# Patient Record
Sex: Female | Born: 1950 | Race: White | Hispanic: No | Marital: Married | Smoking: Former smoker
Health system: Southern US, Community
[De-identification: ages and names within clinical notes are randomized; demographics above are authoritative.]

## PROBLEM LIST (undated history)

## (undated) DIAGNOSIS — R251 Tremor, unspecified: Secondary | ICD-10-CM

## (undated) DIAGNOSIS — M25512 Pain in left shoulder: Secondary | ICD-10-CM

## (undated) DIAGNOSIS — G14 Postpolio syndrome: Secondary | ICD-10-CM

## (undated) DIAGNOSIS — R112 Nausea with vomiting, unspecified: Secondary | ICD-10-CM

## (undated) DIAGNOSIS — M5382 Other specified dorsopathies, cervical region: Secondary | ICD-10-CM

## (undated) DIAGNOSIS — K5909 Other constipation: Secondary | ICD-10-CM

## (undated) DIAGNOSIS — C50919 Malignant neoplasm of unspecified site of unspecified female breast: Secondary | ICD-10-CM

## (undated) DIAGNOSIS — F32A Depression, unspecified: Secondary | ICD-10-CM

## (undated) DIAGNOSIS — IMO0002 Reserved for concepts with insufficient information to code with codable children: Secondary | ICD-10-CM

## (undated) DIAGNOSIS — K219 Gastro-esophageal reflux disease without esophagitis: Secondary | ICD-10-CM

## (undated) DIAGNOSIS — J309 Allergic rhinitis, unspecified: Secondary | ICD-10-CM

## (undated) DIAGNOSIS — G4700 Insomnia, unspecified: Secondary | ICD-10-CM

## (undated) DIAGNOSIS — I739 Peripheral vascular disease, unspecified: Secondary | ICD-10-CM

## (undated) DIAGNOSIS — R413 Other amnesia: Secondary | ICD-10-CM

## (undated) DIAGNOSIS — R011 Cardiac murmur, unspecified: Secondary | ICD-10-CM

## (undated) DIAGNOSIS — M533 Sacrococcygeal disorders, not elsewhere classified: Secondary | ICD-10-CM

## (undated) DIAGNOSIS — G20 Parkinson's disease: Secondary | ICD-10-CM

## (undated) DIAGNOSIS — Z8742 Personal history of other diseases of the female genital tract: Secondary | ICD-10-CM

## (undated) DIAGNOSIS — G5620 Lesion of ulnar nerve, unspecified upper limb: Secondary | ICD-10-CM

## (undated) DIAGNOSIS — Z8669 Personal history of other diseases of the nervous system and sense organs: Secondary | ICD-10-CM

## (undated) DIAGNOSIS — M961 Postlaminectomy syndrome, not elsewhere classified: Secondary | ICD-10-CM

## (undated) DIAGNOSIS — M542 Cervicalgia: Secondary | ICD-10-CM

## (undated) DIAGNOSIS — R232 Flushing: Secondary | ICD-10-CM

## (undated) DIAGNOSIS — I10 Essential (primary) hypertension: Secondary | ICD-10-CM

## (undated) DIAGNOSIS — Z9889 Other specified postprocedural states: Secondary | ICD-10-CM

## (undated) DIAGNOSIS — Z860100 Personal history of colon polyps, unspecified: Secondary | ICD-10-CM

## (undated) DIAGNOSIS — M810 Age-related osteoporosis without current pathological fracture: Secondary | ICD-10-CM

## (undated) DIAGNOSIS — T8859XA Other complications of anesthesia, initial encounter: Secondary | ICD-10-CM

## (undated) DIAGNOSIS — E559 Vitamin D deficiency, unspecified: Secondary | ICD-10-CM

## (undated) DIAGNOSIS — C801 Malignant (primary) neoplasm, unspecified: Secondary | ICD-10-CM

## (undated) DIAGNOSIS — T7840XA Allergy, unspecified, initial encounter: Secondary | ICD-10-CM

## (undated) DIAGNOSIS — E785 Hyperlipidemia, unspecified: Secondary | ICD-10-CM

## (undated) DIAGNOSIS — F329 Major depressive disorder, single episode, unspecified: Secondary | ICD-10-CM

## (undated) DIAGNOSIS — M7710 Lateral epicondylitis, unspecified elbow: Secondary | ICD-10-CM

## (undated) DIAGNOSIS — N951 Menopausal and female climacteric states: Secondary | ICD-10-CM

## (undated) DIAGNOSIS — R209 Unspecified disturbances of skin sensation: Secondary | ICD-10-CM

## (undated) DIAGNOSIS — Z923 Personal history of irradiation: Secondary | ICD-10-CM

## (undated) DIAGNOSIS — G4730 Sleep apnea, unspecified: Secondary | ICD-10-CM

## (undated) DIAGNOSIS — B91 Sequelae of poliomyelitis: Secondary | ICD-10-CM

## (undated) DIAGNOSIS — T4145XA Adverse effect of unspecified anesthetic, initial encounter: Secondary | ICD-10-CM

## (undated) HISTORY — DX: Essential (primary) hypertension: I10

## (undated) HISTORY — PX: COLONOSCOPY: SHX174

## (undated) HISTORY — DX: Unspecified disturbances of skin sensation: R20.9

## (undated) HISTORY — PX: OTHER SURGICAL HISTORY: SHX169

## (undated) HISTORY — DX: Lesion of ulnar nerve, unspecified upper limb: G56.20

## (undated) HISTORY — DX: Lateral epicondylitis, unspecified elbow: M77.10

## (undated) HISTORY — PX: CARPAL TUNNEL RELEASE: SHX101

## (undated) HISTORY — DX: Postlaminectomy syndrome, not elsewhere classified: M96.1

## (undated) HISTORY — DX: Sequelae of poliomyelitis: B91

## (undated) HISTORY — DX: Sacrococcygeal disorders, not elsewhere classified: M53.3

## (undated) HISTORY — PX: SHOULDER ARTHROSCOPY W/ ROTATOR CUFF REPAIR: SHX2400

## (undated) HISTORY — PX: TUBAL LIGATION: SHX77

## (undated) HISTORY — DX: Parkinson's disease: G20

## (undated) HISTORY — PX: TONSILLECTOMY: SUR1361

## (undated) HISTORY — DX: Peripheral vascular disease, unspecified: I73.9

## (undated) HISTORY — PX: ESOPHAGOGASTRODUODENOSCOPY: SHX1529

## (undated) HISTORY — DX: Other specified dorsopathies, cervical region: M53.82

## (undated) HISTORY — PX: SPINE SURGERY: SHX786

## (undated) HISTORY — DX: Malignant (primary) neoplasm, unspecified: C80.1

---
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
## 1995-05-04 HISTORY — PX: ABDOMINAL HYSTERECTOMY: SHX81

## 1998-10-23 ENCOUNTER — Encounter: Payer: Self-pay | Admitting: Orthopedic Surgery

## 1998-10-23 ENCOUNTER — Ambulatory Visit (HOSPITAL_COMMUNITY): Admission: RE | Admit: 1998-10-23 | Discharge: 1998-10-23 | Payer: Self-pay | Admitting: Orthopedic Surgery

## 2000-07-19 HISTORY — PX: FLEXIBLE SIGMOIDOSCOPY: SHX1649

## 2004-02-28 ENCOUNTER — Ambulatory Visit: Payer: Self-pay | Admitting: General Surgery

## 2004-08-10 ENCOUNTER — Ambulatory Visit: Payer: Self-pay | Admitting: Obstetrics and Gynecology

## 2004-08-19 ENCOUNTER — Ambulatory Visit: Payer: Self-pay | Admitting: General Surgery

## 2005-01-05 ENCOUNTER — Ambulatory Visit: Payer: Self-pay | Admitting: Specialist

## 2005-05-11 ENCOUNTER — Ambulatory Visit: Payer: Self-pay | Admitting: Family Medicine

## 2005-06-01 ENCOUNTER — Ambulatory Visit: Payer: Self-pay | Admitting: Obstetrics and Gynecology

## 2005-06-10 ENCOUNTER — Encounter
Admission: RE | Admit: 2005-06-10 | Discharge: 2005-09-08 | Payer: Self-pay | Admitting: Physical Medicine & Rehabilitation

## 2005-06-10 ENCOUNTER — Ambulatory Visit: Payer: Self-pay | Admitting: Physical Medicine & Rehabilitation

## 2005-08-05 ENCOUNTER — Ambulatory Visit: Payer: Self-pay | Admitting: Physical Medicine & Rehabilitation

## 2005-08-09 ENCOUNTER — Ambulatory Visit: Payer: Self-pay | Admitting: Physical Medicine & Rehabilitation

## 2005-08-20 ENCOUNTER — Ambulatory Visit: Payer: Self-pay | Admitting: Family Medicine

## 2005-09-09 ENCOUNTER — Ambulatory Visit: Payer: Self-pay | Admitting: Physical Medicine & Rehabilitation

## 2005-09-09 ENCOUNTER — Encounter
Admission: RE | Admit: 2005-09-09 | Discharge: 2005-12-08 | Payer: Self-pay | Admitting: Physical Medicine & Rehabilitation

## 2005-10-28 ENCOUNTER — Ambulatory Visit: Payer: Self-pay | Admitting: Physical Medicine & Rehabilitation

## 2006-01-31 ENCOUNTER — Ambulatory Visit: Payer: Self-pay | Admitting: Physical Medicine & Rehabilitation

## 2006-01-31 ENCOUNTER — Encounter
Admission: RE | Admit: 2006-01-31 | Discharge: 2006-05-01 | Payer: Self-pay | Admitting: Physical Medicine & Rehabilitation

## 2006-05-03 DIAGNOSIS — IMO0001 Reserved for inherently not codable concepts without codable children: Secondary | ICD-10-CM

## 2006-05-03 DIAGNOSIS — Z8701 Personal history of pneumonia (recurrent): Secondary | ICD-10-CM

## 2006-05-03 DIAGNOSIS — C50919 Malignant neoplasm of unspecified site of unspecified female breast: Secondary | ICD-10-CM

## 2006-05-03 HISTORY — PX: BREAST LUMPECTOMY: SHX2

## 2006-05-03 HISTORY — DX: Personal history of pneumonia (recurrent): Z87.01

## 2006-05-03 HISTORY — DX: Malignant neoplasm of unspecified site of unspecified female breast: C50.919

## 2006-05-03 HISTORY — DX: Reserved for inherently not codable concepts without codable children: IMO0001

## 2006-06-13 ENCOUNTER — Encounter
Admission: RE | Admit: 2006-06-13 | Discharge: 2006-09-11 | Payer: Self-pay | Admitting: Physical Medicine & Rehabilitation

## 2006-06-13 ENCOUNTER — Ambulatory Visit: Payer: Self-pay | Admitting: Physical Medicine & Rehabilitation

## 2006-08-03 ENCOUNTER — Ambulatory Visit: Payer: Self-pay | Admitting: Family Medicine

## 2006-08-08 ENCOUNTER — Encounter: Payer: Self-pay | Admitting: Neurology

## 2006-08-15 ENCOUNTER — Ambulatory Visit: Payer: Self-pay | Admitting: Physical Medicine & Rehabilitation

## 2006-08-23 ENCOUNTER — Ambulatory Visit: Payer: Self-pay | Admitting: Family Medicine

## 2006-08-26 ENCOUNTER — Ambulatory Visit: Payer: Self-pay | Admitting: Family Medicine

## 2006-09-01 ENCOUNTER — Encounter: Payer: Self-pay | Admitting: Neurology

## 2006-09-08 ENCOUNTER — Encounter
Admission: RE | Admit: 2006-09-08 | Discharge: 2006-12-07 | Payer: Self-pay | Admitting: Physical Medicine & Rehabilitation

## 2006-09-08 ENCOUNTER — Ambulatory Visit: Payer: Self-pay | Admitting: General Surgery

## 2006-09-15 ENCOUNTER — Ambulatory Visit: Payer: Self-pay | Admitting: General Surgery

## 2006-09-16 ENCOUNTER — Ambulatory Visit: Payer: Self-pay | Admitting: General Surgery

## 2006-10-02 ENCOUNTER — Ambulatory Visit: Payer: Self-pay | Admitting: Radiation Oncology

## 2006-10-03 ENCOUNTER — Ambulatory Visit: Payer: Self-pay | Admitting: Family Medicine

## 2006-10-06 ENCOUNTER — Ambulatory Visit: Payer: Self-pay | Admitting: Radiation Oncology

## 2006-10-17 ENCOUNTER — Ambulatory Visit: Payer: Self-pay

## 2006-11-01 ENCOUNTER — Ambulatory Visit: Payer: Self-pay | Admitting: Radiation Oncology

## 2006-12-02 ENCOUNTER — Ambulatory Visit: Payer: Self-pay | Admitting: Radiation Oncology

## 2007-01-02 ENCOUNTER — Ambulatory Visit: Payer: Self-pay | Admitting: Radiation Oncology

## 2007-02-01 ENCOUNTER — Ambulatory Visit: Payer: Self-pay | Admitting: Radiation Oncology

## 2007-03-01 ENCOUNTER — Ambulatory Visit: Payer: Self-pay | Admitting: Physical Medicine & Rehabilitation

## 2007-03-01 ENCOUNTER — Encounter
Admission: RE | Admit: 2007-03-01 | Discharge: 2007-05-30 | Payer: Self-pay | Admitting: Physical Medicine & Rehabilitation

## 2007-04-05 ENCOUNTER — Ambulatory Visit: Payer: Self-pay | Admitting: General Surgery

## 2007-04-10 ENCOUNTER — Ambulatory Visit: Payer: Self-pay | Admitting: Specialist

## 2007-04-12 ENCOUNTER — Ambulatory Visit: Payer: Self-pay | Admitting: Physical Medicine & Rehabilitation

## 2007-04-21 ENCOUNTER — Ambulatory Visit: Payer: Self-pay | Admitting: Physical Medicine & Rehabilitation

## 2007-05-22 ENCOUNTER — Encounter: Admission: RE | Admit: 2007-05-22 | Discharge: 2007-08-20 | Payer: Self-pay | Admitting: Anesthesiology

## 2007-05-24 ENCOUNTER — Encounter
Admission: RE | Admit: 2007-05-24 | Discharge: 2007-06-05 | Payer: Self-pay | Admitting: Physical Medicine & Rehabilitation

## 2007-05-24 ENCOUNTER — Ambulatory Visit: Payer: Self-pay | Admitting: Physical Medicine & Rehabilitation

## 2007-06-02 ENCOUNTER — Ambulatory Visit: Payer: Self-pay | Admitting: Physical Medicine & Rehabilitation

## 2007-06-13 ENCOUNTER — Ambulatory Visit: Payer: Self-pay | Admitting: Anesthesiology

## 2007-06-27 ENCOUNTER — Other Ambulatory Visit: Payer: Self-pay

## 2007-06-27 ENCOUNTER — Ambulatory Visit: Payer: Self-pay | Admitting: Specialist

## 2007-06-28 ENCOUNTER — Ambulatory Visit: Payer: Self-pay | Admitting: Cardiology

## 2007-07-02 ENCOUNTER — Ambulatory Visit: Payer: Self-pay | Admitting: Radiation Oncology

## 2007-07-04 ENCOUNTER — Ambulatory Visit: Payer: Self-pay | Admitting: Specialist

## 2007-07-13 ENCOUNTER — Ambulatory Visit: Payer: Self-pay | Admitting: Radiation Oncology

## 2007-07-17 ENCOUNTER — Encounter
Admission: RE | Admit: 2007-07-17 | Discharge: 2007-10-15 | Payer: Self-pay | Admitting: Physical Medicine & Rehabilitation

## 2007-07-17 ENCOUNTER — Ambulatory Visit: Payer: Self-pay | Admitting: Physical Medicine & Rehabilitation

## 2007-07-18 ENCOUNTER — Ambulatory Visit: Payer: Self-pay | Admitting: Physical Medicine & Rehabilitation

## 2007-08-02 ENCOUNTER — Ambulatory Visit: Payer: Self-pay | Admitting: Radiation Oncology

## 2007-08-08 ENCOUNTER — Ambulatory Visit: Payer: Self-pay | Admitting: Anesthesiology

## 2007-08-29 ENCOUNTER — Ambulatory Visit: Payer: Self-pay | Admitting: Physical Medicine & Rehabilitation

## 2007-09-26 ENCOUNTER — Ambulatory Visit: Payer: Self-pay | Admitting: Physical Medicine & Rehabilitation

## 2007-10-06 ENCOUNTER — Ambulatory Visit: Payer: Self-pay | Admitting: General Surgery

## 2007-10-16 ENCOUNTER — Ambulatory Visit (HOSPITAL_COMMUNITY): Admission: RE | Admit: 2007-10-16 | Discharge: 2007-10-17 | Payer: Self-pay | Admitting: Neurosurgery

## 2007-11-01 ENCOUNTER — Ambulatory Visit: Payer: Self-pay | Admitting: Radiation Oncology

## 2007-12-14 ENCOUNTER — Encounter: Admission: RE | Admit: 2007-12-14 | Discharge: 2007-12-14 | Payer: Self-pay | Admitting: Neurosurgery

## 2008-04-03 ENCOUNTER — Ambulatory Visit: Payer: Self-pay | Admitting: Specialist

## 2008-05-03 DIAGNOSIS — K635 Polyp of colon: Secondary | ICD-10-CM

## 2008-05-03 HISTORY — DX: Polyp of colon: K63.5

## 2008-05-07 ENCOUNTER — Ambulatory Visit: Payer: Self-pay | Admitting: Internal Medicine

## 2008-05-07 ENCOUNTER — Ambulatory Visit: Payer: Self-pay | Admitting: Specialist

## 2008-05-08 ENCOUNTER — Ambulatory Visit: Payer: Self-pay | Admitting: General Surgery

## 2008-05-14 ENCOUNTER — Ambulatory Visit: Payer: Self-pay | Admitting: Specialist

## 2008-07-01 ENCOUNTER — Ambulatory Visit: Payer: Self-pay | Admitting: Radiation Oncology

## 2008-07-10 ENCOUNTER — Ambulatory Visit: Payer: Self-pay | Admitting: Radiation Oncology

## 2008-07-22 ENCOUNTER — Encounter: Payer: Self-pay | Admitting: Specialist

## 2008-08-01 ENCOUNTER — Encounter: Payer: Self-pay | Admitting: Specialist

## 2008-08-01 ENCOUNTER — Ambulatory Visit: Payer: Self-pay | Admitting: Radiation Oncology

## 2008-10-01 ENCOUNTER — Ambulatory Visit: Payer: Self-pay | Admitting: Gastroenterology

## 2008-10-08 ENCOUNTER — Ambulatory Visit: Payer: Self-pay | Admitting: General Surgery

## 2009-03-18 ENCOUNTER — Ambulatory Visit: Payer: Self-pay | Admitting: Family Medicine

## 2009-03-21 ENCOUNTER — Ambulatory Visit: Payer: Self-pay | Admitting: Family Medicine

## 2009-04-02 ENCOUNTER — Ambulatory Visit: Payer: Self-pay | Admitting: General Surgery

## 2009-04-21 ENCOUNTER — Encounter: Payer: Self-pay | Admitting: Neurosurgery

## 2009-05-03 ENCOUNTER — Encounter: Payer: Self-pay | Admitting: Neurosurgery

## 2009-05-03 DIAGNOSIS — G20A1 Parkinson's disease without dyskinesia, without mention of fluctuations: Secondary | ICD-10-CM

## 2009-05-03 DIAGNOSIS — I739 Peripheral vascular disease, unspecified: Secondary | ICD-10-CM

## 2009-05-03 DIAGNOSIS — G20 Parkinson's disease: Secondary | ICD-10-CM

## 2009-05-03 HISTORY — DX: Parkinson's disease: G20

## 2009-05-03 HISTORY — DX: Peripheral vascular disease, unspecified: I73.9

## 2009-05-03 HISTORY — DX: Parkinson's disease without dyskinesia, without mention of fluctuations: G20.A1

## 2009-05-03 HISTORY — PX: MASTECTOMY: SHX3

## 2009-05-03 HISTORY — PX: ELBOW SURGERY: SHX618

## 2009-06-04 ENCOUNTER — Ambulatory Visit: Payer: Self-pay | Admitting: Family Medicine

## 2009-06-04 ENCOUNTER — Ambulatory Visit: Payer: Self-pay | Admitting: Specialist

## 2009-06-10 ENCOUNTER — Ambulatory Visit: Payer: Self-pay | Admitting: Specialist

## 2009-07-10 ENCOUNTER — Ambulatory Visit: Payer: Self-pay | Admitting: Radiation Oncology

## 2009-09-10 ENCOUNTER — Encounter
Admission: RE | Admit: 2009-09-10 | Discharge: 2009-11-27 | Payer: Self-pay | Admitting: Physical Medicine & Rehabilitation

## 2009-09-12 ENCOUNTER — Ambulatory Visit: Payer: Self-pay | Admitting: Physical Medicine & Rehabilitation

## 2009-09-22 ENCOUNTER — Encounter: Payer: Self-pay | Admitting: Specialist

## 2009-09-25 ENCOUNTER — Ambulatory Visit: Payer: Self-pay | Admitting: Physical Medicine & Rehabilitation

## 2009-10-01 ENCOUNTER — Encounter: Payer: Self-pay | Admitting: Specialist

## 2009-10-01 ENCOUNTER — Ambulatory Visit: Payer: Self-pay | Admitting: Otolaryngology

## 2009-10-15 ENCOUNTER — Ambulatory Visit: Payer: Self-pay | Admitting: General Surgery

## 2009-10-28 ENCOUNTER — Ambulatory Visit: Payer: Self-pay | Admitting: General Surgery

## 2009-11-04 ENCOUNTER — Encounter: Admission: RE | Admit: 2009-11-04 | Discharge: 2009-11-04 | Payer: Self-pay

## 2009-11-06 ENCOUNTER — Ambulatory Visit: Payer: Self-pay | Admitting: Gastroenterology

## 2009-11-27 ENCOUNTER — Ambulatory Visit: Payer: Self-pay | Admitting: Physical Medicine & Rehabilitation

## 2009-12-04 ENCOUNTER — Ambulatory Visit: Payer: Self-pay | Admitting: General Surgery

## 2009-12-04 HISTORY — PX: BREAST SURGERY: SHX581

## 2009-12-10 LAB — PATHOLOGY REPORT

## 2010-01-27 ENCOUNTER — Encounter
Admission: RE | Admit: 2010-01-27 | Discharge: 2010-02-02 | Payer: Self-pay | Source: Home / Self Care | Attending: Physical Medicine & Rehabilitation | Admitting: Physical Medicine & Rehabilitation

## 2010-02-02 ENCOUNTER — Ambulatory Visit: Payer: Self-pay | Admitting: Physical Medicine & Rehabilitation

## 2010-05-04 ENCOUNTER — Ambulatory Visit: Payer: Self-pay | Admitting: Physical Medicine & Rehabilitation

## 2010-05-13 ENCOUNTER — Encounter
Admission: RE | Admit: 2010-05-13 | Discharge: 2010-05-25 | Payer: Self-pay | Source: Home / Self Care | Attending: Physical Medicine & Rehabilitation | Admitting: Physical Medicine & Rehabilitation

## 2010-05-14 ENCOUNTER — Ambulatory Visit
Admission: RE | Admit: 2010-05-14 | Discharge: 2010-05-14 | Payer: Self-pay | Source: Home / Self Care | Attending: Physical Medicine & Rehabilitation | Admitting: Physical Medicine & Rehabilitation

## 2010-05-20 ENCOUNTER — Encounter
Admission: RE | Admit: 2010-05-20 | Discharge: 2010-05-25 | Payer: Self-pay | Source: Home / Self Care | Attending: Physical Medicine & Rehabilitation | Admitting: Physical Medicine & Rehabilitation

## 2010-05-24 ENCOUNTER — Encounter: Payer: Self-pay | Admitting: Family Medicine

## 2010-06-10 ENCOUNTER — Ambulatory Visit: Payer: Self-pay | Admitting: General Surgery

## 2010-06-22 ENCOUNTER — Ambulatory Visit: Payer: Medicare Other | Admitting: Physical Medicine & Rehabilitation

## 2010-06-22 ENCOUNTER — Encounter: Payer: Medicare Other | Attending: Physical Medicine & Rehabilitation

## 2010-06-22 DIAGNOSIS — M961 Postlaminectomy syndrome, not elsewhere classified: Secondary | ICD-10-CM | POA: Insufficient documentation

## 2010-06-22 DIAGNOSIS — M25559 Pain in unspecified hip: Secondary | ICD-10-CM | POA: Insufficient documentation

## 2010-06-22 DIAGNOSIS — M47812 Spondylosis without myelopathy or radiculopathy, cervical region: Secondary | ICD-10-CM | POA: Insufficient documentation

## 2010-06-22 DIAGNOSIS — M545 Low back pain, unspecified: Secondary | ICD-10-CM | POA: Insufficient documentation

## 2010-06-22 DIAGNOSIS — R209 Unspecified disturbances of skin sensation: Secondary | ICD-10-CM | POA: Insufficient documentation

## 2010-06-22 DIAGNOSIS — Z981 Arthrodesis status: Secondary | ICD-10-CM | POA: Insufficient documentation

## 2010-06-22 DIAGNOSIS — IMO0001 Reserved for inherently not codable concepts without codable children: Secondary | ICD-10-CM

## 2010-06-22 DIAGNOSIS — M79609 Pain in unspecified limb: Secondary | ICD-10-CM | POA: Insufficient documentation

## 2010-06-22 DIAGNOSIS — M531 Cervicobrachial syndrome: Secondary | ICD-10-CM

## 2010-07-08 ENCOUNTER — Encounter: Payer: Self-pay | Admitting: Physical Medicine & Rehabilitation

## 2010-07-27 ENCOUNTER — Ambulatory Visit: Payer: Medicare Other | Admitting: Physical Medicine & Rehabilitation

## 2010-07-27 ENCOUNTER — Encounter: Payer: Medicare Other | Attending: Physical Medicine & Rehabilitation

## 2010-07-27 DIAGNOSIS — IMO0001 Reserved for inherently not codable concepts without codable children: Secondary | ICD-10-CM

## 2010-07-27 DIAGNOSIS — M5382 Other specified dorsopathies, cervical region: Secondary | ICD-10-CM

## 2010-07-27 DIAGNOSIS — M545 Low back pain, unspecified: Secondary | ICD-10-CM | POA: Insufficient documentation

## 2010-07-27 DIAGNOSIS — M961 Postlaminectomy syndrome, not elsewhere classified: Secondary | ICD-10-CM

## 2010-07-27 DIAGNOSIS — R209 Unspecified disturbances of skin sensation: Secondary | ICD-10-CM | POA: Insufficient documentation

## 2010-07-27 DIAGNOSIS — M533 Sacrococcygeal disorders, not elsewhere classified: Secondary | ICD-10-CM

## 2010-07-27 DIAGNOSIS — M47812 Spondylosis without myelopathy or radiculopathy, cervical region: Secondary | ICD-10-CM | POA: Insufficient documentation

## 2010-07-27 DIAGNOSIS — M79609 Pain in unspecified limb: Secondary | ICD-10-CM | POA: Insufficient documentation

## 2010-07-27 DIAGNOSIS — Z981 Arthrodesis status: Secondary | ICD-10-CM | POA: Insufficient documentation

## 2010-07-27 DIAGNOSIS — M25559 Pain in unspecified hip: Secondary | ICD-10-CM | POA: Insufficient documentation

## 2010-08-31 ENCOUNTER — Encounter: Payer: Medicare Other | Attending: Physical Medicine & Rehabilitation

## 2010-08-31 ENCOUNTER — Ambulatory Visit: Payer: Medicare Other | Admitting: Physical Medicine & Rehabilitation

## 2010-08-31 DIAGNOSIS — Z981 Arthrodesis status: Secondary | ICD-10-CM | POA: Insufficient documentation

## 2010-08-31 DIAGNOSIS — M961 Postlaminectomy syndrome, not elsewhere classified: Secondary | ICD-10-CM | POA: Insufficient documentation

## 2010-08-31 DIAGNOSIS — M79609 Pain in unspecified limb: Secondary | ICD-10-CM | POA: Insufficient documentation

## 2010-08-31 DIAGNOSIS — M533 Sacrococcygeal disorders, not elsewhere classified: Secondary | ICD-10-CM

## 2010-08-31 DIAGNOSIS — M47812 Spondylosis without myelopathy or radiculopathy, cervical region: Secondary | ICD-10-CM | POA: Insufficient documentation

## 2010-08-31 DIAGNOSIS — IMO0001 Reserved for inherently not codable concepts without codable children: Secondary | ICD-10-CM | POA: Insufficient documentation

## 2010-08-31 DIAGNOSIS — M545 Low back pain, unspecified: Secondary | ICD-10-CM | POA: Insufficient documentation

## 2010-08-31 DIAGNOSIS — M25559 Pain in unspecified hip: Secondary | ICD-10-CM | POA: Insufficient documentation

## 2010-09-01 NOTE — Procedures (Signed)
Andrea [name], BLUESTONE                 ACCOUNT NO.:  [id number]  MEDICAL RECORD NO.:  [id number]           PATIENT TYPE:  O  LOCATION:  [facility]                          FACILITY:  [facility]  PHYSICIAN:  Erick Colace, M.D.DATE OF BIRTH:  [birth year]  DATE OF PROCEDURE:  08/31/2010 DATE OF DISCHARGE:                              OPERATIVE REPORT  PROCEDURE:  Bilateral sacroiliac injection under fluoroscopic guidance.  INDICATIONS:  Sacroiliac pain.  Pain is only partially responsive to medication management including narcotic analgesics and interferes with activity, rates as 6/10.  Informed consent was obtained after describing risks and benefits of the procedure with the patient.  These include bleeding, bruising, and infection.  She elects to proceed and has given written consent.  The patient was placed prone on fluoroscopy table.  Betadine prep, sterile drape, 25-gauge inch and half needle was used to anesthetize the skin and subcutaneous tissue with 1% lidocaine x2 mL on each side.  Then, a 25-gauge 3-inch spinal needle was inserted under fluoroscopic guidance, first starting at right SI joint.  AP, lateral, and oblique aid images utilized.  Omnipaque 180 under live fluoro x0.5 mL demonstrated no intravascular uptake.  Then, 1.5 mL of solution containing 1 mL of 40 mg/mL Depo-Medrol and 2 mL of 2% lidocaine were injected.  The same procedure was repeated on the left side using same needle, injectate, and technique.  The patient tolerated the procedure well.  Postprocedure instructions were given.     Erick Colace, M.D. Electronically Signed    AEK/MEDQ  D:  08/31/2010 14:26:10  T:  09/01/2010 01:38:46  Job:  [id number]  cc:   Leim Fabry, MD

## 2010-09-15 NOTE — Procedures (Signed)
Andrea [name], Andrea [name] NO.:  [id number]   MEDICAL RECORD NO.:  [id number]          PATIENT TYPE:  REC   LOCATION:  [facility]                          FACILITY:  [facility]   PHYSICIAN:  Celene Kras, MD        DATE OF BIRTH:  07-Jul-1950   DATE OF PROCEDURE:  08/08/2007  DATE OF DISCHARGE:                               OPERATIVE REPORT   SUBJECTIVE:  Andrea [name] comes into the pain management today.  Review of  systems 14-point review of systems.  1. Still having some arm pain, but improving, better range of motion,      better sleep patterns, improved function.  2. Nothing advancing from a neurological perspective, do not believe      further imaging or diagnostics are warranted.  3. I will go on with third cervical epidural.  She is benefiting from      these so will stay the course.  As she has a spondylosis, I would      consider going on to facet medial branch intervention, but she has      somewhat of a myelopathy so we will see if we cannot address that      and determine further course of care.   OBJECTIVE:  Diffuse paracervical myofascial discomfort, positive  cervical __________  compression test particular right greater than  left.  Suboccipital compression test positive.  Suprascapular/levator  scapular extension, good grip strength, I do not find anything new from  a neurological perspective, good reflexes.   IMPRESSION:  Degenerative spine disease and cervical spine with arm  pain.   PLAN:  1. Cervical epidural.  She has consented.  Predicate any further      intervention based on need and follow up with her.  2. Again counseled as to modifiable features in the health profile      such as cigarette cessation.  3. She will maintain contact with primary care.   She has consented.   PROCEDURE:  The patient was taken to fluoroscopy suite and placed in the  prone position.  Back prepped and draped in the usual fashion.  Using a  __________  needle, I advanced under  direct fluoroscopic observation to  C7-T1 without any evidence of CSF __________  or paresthesia.  Test  block uneventfully followed, 40 mg Aristocort flush needle.   She tolerated procedure well.  No complications from our procedure.  Appropriate recovery.  Discharge instructions given.           ______________________________  Celene Kras, MD     HH/MEDQ  D:  08/08/2007 09:58:27  T:  08/08/2007 11:21:55  Job:  [id number]

## 2010-09-15 NOTE — Op Note (Signed)
Andrea [name], [name]                 ACCOUNT NO.:  [id number]   MEDICAL RECORD NO.:  [id number]          PATIENT TYPE:  OIB   LOCATION:  3526                         FACILITY:  [facility]   PHYSICIAN:  Kathaleen Maser. Pool, M.D.    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   PROCEDURE NAME:  C5-6 anterior cervical diskectomy, fusion, allograft,  and plating.   SURGEON:  Kathaleen Maser. Pool, MD.   ASSISTANT:  Donalee Citrin, MD   ANESTHESIA:  General endotracheal.   INDICATIONS:  Andrea [name] is a 60 year old female with history of neck  and bilateral upper extremity pain and paresthesias consistent with her  early cervical myelopathy.  Workup demonstrates evidence of moderate  severe stenosis with spinal cord compression at C5-6.  The patient has  been counseled as to her options.  She decided to proceed with C5-6  anterior cervical diskectomy and fusion with allograft and plating in  hopes of improving her symptoms.   OPERATIVE NOTE:  The patient was placed on the operative table in supine  position.  After adequate level of anesthesia achieved, the patient was  positioned supine with her neck slightly extended up with Halter  traction.  The patient's anterior cervical regions were prepped and  draped sterilely.  A 10-blade was used to make a curvilinear skin  incision, overlying the C5-6 interspace.  This was carried down sharply  to the platysma.  Platysma then divided vertically and dissection  proceeded along the border of the sternocleidomastoid muscle and carotid  sheath.  Trachea and esophagus were mobilized and tracked towards the  left.  Prevertebral fascia was stripped off the anterior spinal column.  Longus colli muscles were then elevated bilaterally using  electrocautery.  Deep  self-retaining retractor was placed.  Intraoperative fluoroscopy was used and C5-6 level was confirmed.  Disk  space then incised with #15 blade in rectangular fashion.  Wide disk  space clean-out was then achieved using pituitary rongeurs, forward and  back-angled Karlin curettes, Kerrison rongeurs, and high-speed drill.  Elements of the disk removed down toward the posterior annulus.  Microscope was then brought into the field and used throughout the  remainder of diskectomy and remainder aspects of annulus and osteophytes  were removed using high-speed drill down to the level of the posterior  longitudinal ligament.  Posterior longitudinal ligament was then  elevated and resected in piecemeal fashion using Kerrison rongeurs.  Underlying thecal sac was then identified.  A wide central decompression  was then performed undercutting the bodies at bodies of C5 and C6.  Decompression then proceeded out to each neural foramen.  Wide anterior  foraminotomies were then performed along the course of exiting C6 nerve  root bilaterally.  At this point, a very thorough decompression had been  achieved.  There is no injury to thecal sac and nerve roots. Wounds were  then irrigated with antibiotic solution.  Gelfoam was placed topically  for hemostasis and found to be good.  A 7-mm LifeNet allograft wedges  were then packed into place and recessed less than 1 mm from the  anterior cortical margin.  A 25-mm Atlantis cervical plate was then  placed on the C5 and C6 levels.  This was then attached under  fluoroscopic guidance 2 screws each at C5 and C6.  All 4 screws were  given final site tightening, found to be solid within bone.  Locking  screws engaged at both levels.  Final images revealed good position of  bone grafts and hardware at proper operative level with normal alignment  of spine.  Wound was then irrigated one final time.  Hemostasis was  ensured with bipolar electrocautery, wound was then  closed in layers.  Steri-Strips and sterile dressings were applied.  There were no  complications.  She tolerated procedure well and she returned to  recovery room postoperatively.           ______________________________  Kathaleen Maser Pool, M.D.     HAP/MEDQ  D:  10/16/2007  T:  10/17/2007  Job:  [id number]

## 2010-09-15 NOTE — Assessment & Plan Note (Signed)
[name] returns today.  I last saw her on June 13, 2007.  At that  time she was describing intermittent tingling and aching in her  bilateral hands and MRI did demonstrate C5-6 annular bulge, leftward  more than right side, moderate foraminal encroachment as well as some  right-sided encroachment at C4-5.  No signs of central stenosis.  She  underwent cervical epidural steroid injection at C7-T1, and then at C3-  C4 per Dr. Stevphen Rochester.  She has had relief of her hand numbness, tingling  pain and approximate one-third reduction of her neck pain.   Her chief complaint today is collar bone pain on the right side.  She  has had no trauma to the right collar bone.  She has had right shoulder  surgery in [place].  She states that it was an open procedure done  with what sounds like a brachial plexus nerve block.  She has had good  recovery.  In fact, is able to reach her arm with full range overhead.   Her pain is described as sharp, stabbing in the medial aspect of the  clavicle.  Pain is worse with certain head movements, tuning her head to  the right or sometimes bending it toward that way.   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   REVIEW OF SYSTEMS:  Positive for numbness, tingling, depression, some  limb swelling.   EXAMINATION:  Blood pressure 119/71, pulse 69, respiratory rate 18, O2  saturation 99% on room air.  GENERAL:  No acute distress.  Mood and affect appropriate.  Orientation  x3.  Affect is alert.  GAIT:  Stiff-legged gait on the left due to left KAFO.  Her motor strength is 5- at the deltoid, bicep, tricep, grip on the left  side, 5/5 in the right deltoid, bicep, tricep, grip.  Lower extremity  strength is 0/5 left side, 5- in the right hip flexor, knee extensor,  ankle dorsiflexor.  Her right clavicle has no deformities.  No  tenderness to palpation over that area.  No tenderness over the cervical  anterior  musculature.  There is mild tenderness over right cervical  lateral masses.   IMPRESSION:  1. Cervical spinal stenosis, radicular component improved after      epidural steroid injections.  Does still have some axial neck pain,      which may be related to a cervical facet syndrome.  2. Right clavicular pain.  Question if this is referred from her neck.      May be some nerve irritation from brachial plexus block, but does      appear to be more mechanical in that certain neck motions do      aggravate.  We will see how she does with cervical facet      injections.  If no improvement, consider some myofascial trigger      point injections as well.  We will do a right clavicular x-ray as      well.  I will follow up in one month.  She will see Dr. Stevphen Rochester for      facet injections on August 08, 2007.      Erick Colace, M.D.  Electronically Signed     AEK/MedQ  D:  07/18/2007 14:06:59  T:  07/18/2007 16:00:12  Job #:  [id number]   cc:   Reita Chard  Fax: [phone]   Ferne Reus, Dr.  Nicholes Rough, [place]

## 2010-09-15 NOTE — Procedures (Signed)
Andrea [name], Andrea [name]                 ACCOUNT NO.:  [id number]   MEDICAL RECORD NO.:  [id number]          PATIENT TYPE:  REC   LOCATION:  [facility]                          FACILITY:  [facility]   PHYSICIAN:  Erick Colace, M.D.DATE OF BIRTH:  1950-08-11   DATE OF PROCEDURE:  04/24/2007  DATE OF DISCHARGE:                               OPERATIVE REPORT   PROCEDURE:  Upper trapezius, bilateral trigger point, as well as  bilateral levator scapula trigger point injection.   INDICATIONS:  Myofascial pain in cervical trapezius area, only partially  responsive to medication management under conservative care.   DESCRIPTION OF PROCEDURE:  Informed consent was obtained, after  describing risks and benefits of the procedure.  The patient did not  have any bleeding, bruising, or infection.  She elects to proceed, and  has given written consent.   The patient placed prone on the fluoroscopy table.  The area marked and  prepped with Betadine.  Entered with 25-gauge 1-1/2-inch needle, 1 mL of  a solution containing 1 mL of 40 mg/mL Depo-Medrol and 4 mL of 1% MPF  lidocaine were injected.  This was done in a total of four spots to  upper trap, and 2 to the deep levator scapulae bilaterally.  Injections  done after negative drawback for blood.  The patient tolerated procedure  well.  Postprocedure instructions given.      Erick Colace, M.D.  Electronically Signed     AEK/MEDQ  D:  04/24/2007 11:28:37  T:  04/24/2007 18:03:13  Job:  [id number]

## 2010-09-15 NOTE — Assessment & Plan Note (Signed)
Andrea [name] returns today.  She is a 60 year old female with left greater  than right lower extremity weakness due to post-polio syndrome.  She has  had new onset right shoulder pain with minimal improvement with  injections per Dr. Katrinka Blazing.  She has an MRI scheduled.  We did an MRA, EMG  and CV of bilateral upper extremities on 03/02/07.  She has low  amplitude responsive median nerves bilaterally with prolonged distal  latency and normal conduction velocity.  She does have a history of  carpal tunnel release bilaterally.  In her ulnar nerves, she had mildly  prolonged distal latency, normal amplitudes and slowing of conduction  across the left elbow.  Noted at her left ulnar sensory this latency was  delayed and her right ulnar sensory had normal distal latency.   HER FUNCTIONAL REVIEW:  Needs some assistance bathing her back.  Kneel  prep household duties are also difficult.  Last worked 09/29/06.  She  retired.  Her average pain is about 4/10.  Sleep is fair.   REVIEW OF SYSTEMS:  Positive for weakness and left lower extremity  numbness and left hand and limb swelling.   Blood pressure is 120/76, pulse 62, respirations 18, satting 99% on room  air  NECK:  Good range of motion.  Flexion/extension, lateral rotation and  bending.  Negative  Spurling's maneuver.  Shoulder strength is 5/5  deltoid, biceps, triceps, grip bilaterally.  Deep tendon reflexes normal  bilateral upper extremity.  She has positive Tinel's of her left elbow.  Some mild tingling sensation when I brush across her left little finger.  Lower extremity strength is essentially 0/5.  Knee extension in left  lower extremity 4, 4- on the right lower extremity.  Gait stiff-legged.  She has a left KAFO .   IMPRESSION:  1. Post-polio syndrome.  2. Left ulnar neuropathy.  3. History of median neuropathy status post decompression.  As I noted      to the patient, could not tell her whether the median nerve      abnormalities  are newer or older unless I have a comparison exam.   PLAN:  Will send her to therapy for nerve glide exercises, left ulnar  nerve.  She may need an elbow extension brace to prevent excessive  flexion.  I will see her back in about a month.      Erick Colace, M.D.  Electronically Signed     AEK/MedQ  D:  03/28/2007 12:26:02  T:  03/28/2007 13:04:18  Job #:  [id number]   cc:   Leim Fabry, M.D.   Reita Chard  Fax: [phone]

## 2010-09-15 NOTE — Procedures (Signed)
NAMEMAYLINE, DRAGON NO.:  [id number]   MEDICAL RECORD NO.:  [id number]          PATIENT TYPE:  REC   LOCATION:  [facility]                          FACILITY:  [facility]   PHYSICIAN:  Celene Kras, MD        DATE OF BIRTH:  02-22-1951   DATE OF PROCEDURE:  DATE OF DISCHARGE:                               OPERATIVE REPORT   [name], I have  evaluated the history form and 14 point review of systems, I reviewed  the available imaging.  Progress to date overall directed care approach.  A pleasant individual who is complaining of bilateral arm pain, she has  some modest myelopathy.  She has paracervical discomfort and functional  impairment secondary to pain. Long standing neck pain, progressive, with  impairment of activities of daily living and quality of life indices,  and I am asked to consider cervical epidural for management.   1. I discussed modifiable features in the health profile for best      outcome, that would be cigarette cessation.   1. Consider addressing the facet. She has a mixed mechanical      diskogenic pain, and I want to see her back after this intervention      to see how she does.  I plan a cervical epidural today, with exam      and historical features suggesting, C7-T1, 40, and I have discussed      this using models, discussed in lay terms.  No barrier to      communication.   Objectively, diffuse paracervical suprascapular myofascial discomfort,  positive cervical fetal compression test, right left.  Suboccipital  compression test positive.  Range of motion impaired secondary to pain.  She has a pretty good grip strength, but the triceps, particularly to  the right side, maybe 1+, somewhat diminished, nothing new  neurologically.   IMPRESSION:  Degenerative spine disease, cervical spine, radiculopathy.   PLAN:  Cervical epidural C7-T1.  She is consented.  The risks,  complications, and options  are fully reviewed.  Review of medication  appropriate.  The rationale to perform intervention to minimize  escalation of controlled substances.  She is consented for today's  procedure.   The patient was taken to the fluoroscopy suite and placed in the prone  position.  Her back was prepped and draped in the usual fashion. Using a  Husted needle, I advanced to the C7-T1 interspace without any evidence  of CSF, heme or paresthesia.  I test block uneventfully.  I followed  with 40 mg of Aristocort and flushed the needle.   Tolerated the procedure well.  No complications from our procedure.  Appropriate recovery.  Discharge instructions given.  No barrier to  communication.  Will see her in follow up.           ______________________________  Celene Kras, MD    HH/MEDQ  D:  06/13/2007 12:18:36  T:  06/15/2007 01:05:57  Job:  [id number]

## 2010-09-15 NOTE — Procedures (Signed)
NAMEESCARLET, Andrea [name] NO.:  [id number]   MEDICAL RECORD NO.:  [id number]          PATIENT TYPE:  REC   LOCATION:  T[facility]                          FACILITY:  [facility]   PHYSICIAN:  Erick Colace, M.D.DATE OF BIRTH:  January 04, 1951   DATE OF PROCEDURE:  05/25/2007  DATE OF DISCHARGE:                               OPERATIVE REPORT   Right upper trapezius trigger point injection.  Two areas of the upper  trap marked, prepped with Betadine, entered with 25 gauge needle. After  negative draw back for blood, 1 mL of 1% lidocaine were injected into  each side with trigger point deactivation.  The patient tolerated the  procedure well.  The patient had good response in past,  same procedure.  I will see her back in one month, reevaluate.      Erick Colace, M.D.  Electronically Signed     AEK/MEDQ  D:  05/25/2007 17:51:48  T:  05/26/2007 09:01:56  Job:  [id number]

## 2010-09-15 NOTE — Assessment & Plan Note (Signed)
A 60 year old female with post polio syndrome affecting mainly bilateral  lower extremities and left upper extremity.  She had newer onset right  shoulder pain, minimal improvement with shoulder injections per Dr.  Katrinka Blazing from orthopedics.  MRI showed some supraspinatus tendinosis.  MRI  of the cervical spine demonstrated severe stenosis at C5-6 levels, no  abnormal cord signals.  She has had disk space narrowing C5-6, C6-7.  She has foraminal narrowing C4-5 and C3-4 as well but the right C5-6 has  the most significant foraminal narrowing.  This is compared to May 11, 2005.   She has had no new gait problems, no new bowel or bladder difficulties.  Continues to have shoulder pain in the right as well as bilateral upper  trap pain.  She rates her pain on average as 7 out of 10, currently 4  out of 10 interfering with activity at a moderate level.  Her sleep is  fair.  Her pain is worse with walking, sitting and standing.  She can  walk 15 minutes at a time, she can climb only a small number of steps.  She uses KAFOs for ambulation bilaterally.  She requires assistance with  bathing, household duties, shopping.   REVIEW OF SYSTEMS:  Positive for weakness in the legs which is chronic.  She has some numbness in the right foot and sharp stabbing pains in the  right shoulder.  She has depression but no anxiety or suicidal thoughts.   Her blood pressure is at 139/67, pulse 64, respirations 18, O2 sat 100%  room air.  Her sensation is normal bilateral deltoid area corresponding  to C5 but reduced left C6 and left C8 compared to the right side.  She  has normal strength on the right side in the deltoid, bicep, tricep grip  on the left side.  She has 5- in the deltoid, bicep, tricep grip.  Lower  extremity strength is essentially 0 out of 5 on the left side and 5- in  the right hip flexor, knee extensor, ankle dorsiflexor.  She uses a left  KAFO.   IMPRESSION:  1. Cervical spinal stenosis.   We will set her up for epidural      injection, I believe her shoulder pain may be coming from that      rather than the supraspinatus tendinosis.  2. Myofascial pain syndrome bilateral upper traps, will do trigger      point injections.      Andrea [name], M.D.  Electronically Signed     AEK/MedQ  D:  04/24/2007 11:32:57  T:  04/24/2007 14:04:23  Job #:  [id number]   cc:   Dr. Dayna Barker  Primary Care   Reita Chard  Fax: [phone]

## 2010-09-15 NOTE — Assessment & Plan Note (Signed)
The patient returns today.  I last saw her May 25, 2007 for trigger  point injections which did help with her upper trapezius pain.  Over the  last 2 weeks, however, she has had some increasing tingling in her arms  and dizziness and some headache pain.  Her pain described as  intermittent tingling and aching, goes to her hands.  Her pain is worse  throughout the day.  Pain is worse when she is standing up, walking,  bending, sitting, improves with medication.  She can walk 15 minutes at  a time.  She climbs 7 to 8 steps using her KAFOs.  She does have post  polio syndrome.  She drives, she uses a wheelchair for longer distances.  She has been disabled since Sep 30, 2006.  She needs some assistance  with bathing, household duties and shopping due to her post polio  syndrome.   Her other review of systems positive for dizziness as noted above.   She denies any bowel or bladder dysfunction.   PHYSICAL EXAMINATION:  Her blood pressure is 128/79, pulse 64,  respiratory rate 18, O2 sat 100% on room air.  Her motor exam is 5/5 strength in the right deltoid, biceps, triceps  grip.  On the left side it is 5-/5 in the left deltoid, biceps, triceps  grip.  Sensationally mildly diminished to the left C8 only.  Her neck range of motion is 75% range forward flexion, extension,  lateral rotation, bending.  Her deep tendon reflexes are 2+ biceps, triceps, brachioradialis, absent  at the knees, but this is chronic due to her polio.   IMPRESSION:  Cervical spondylosis without myelopathy.  She does have  some radicular components to her pain, but no focal neurologic deficits.  Her left little finger numbness has been previously documented by EMG to  be a left ulnar neuropathy at the elbow.  She has had shoulder MRI  demonstrating right supraspinatus tendinosis.  Her cervical MRI has  shown a C5-C6 annular bulge, leftward more than right side, moderate  foraminal encroachment.  At C4-C5 there is  right-sided encroachment at  the foramen.  No signs of central stenosis.  This was performed on  May 11, 2005.   She will see Dr. Stevphen Rochester for a cervical epidural.  In terms of her  dizziness, really this seems to be when she suddenly turns her head.  She has had a upper respiratory tract infection, and this may be more  middle ear, and I will give her some meclizine at 12.5 up to t.i.d.  p.r.n.      Erick Colace, M.D.  Electronically Signed     AEK/MedQ  D:  06/05/2007 17:07:54  T:  06/06/2007 10:52:52  Job #:  [id number]

## 2010-09-15 NOTE — Assessment & Plan Note (Signed)
The patient returns today.  I last saw her on Sep 28, 2007.  In the  interval of time, she has seen Dr. Clearnce Sorrel [facility] who recommended her to see a neurosurgeon in regards to her neck  problems.  She has had an MRI dated April 12, 2007, showing  degenerative changes at C5-6, primarily some moderate but severe spinal  stenosis, slight flattening of the cord.  She had chronic unchanged  slight anterior listhesis of C4 relative to C3.  She had more right-  sided symptoms at C5-6 on the left as well as at C4-5, however, her  symptomatology was worse in the left arm.  She has had no weakness or  dropping of objects.  I did to an EMG on her on March 02, 2007, prior  to the worsening of her neck pain.  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  She also had some post polio changes  noted on her needle EMG examination.   She continues to use a KAFO for ambulation.  She averages 5/10 pain.  Her sleep is poor.  She can walk 15 minutes at a time, climb steps, and  drive.  She still needs some help with meal prep, household duties, and  shopping.   REVIEW OF SYSTEMS:  Positive for depression and dizziness.  She was  tried on Cymbalta last month and went back to Lexapro because she did  not like the way the Cymbalta made her feel.   PHYSICAL EXAMINATION:  VITAL SIGNS:  Blood pressure 126/68, pulse 62,  respiratory rate 18, O2 saturations 100% on room air.  GENERAL:  No acute distress.  Mood and affect appropriate.  Motor  strength is 5/5 in the bilateral deltoid, biceps, grip, 4 in the left  triceps, 5 in the right triceps.  Sensation is intact bilateral C5, C6,  C7, C8, and T1 dermatomes.  Her upper extremity range of motion is  normal.  Gait is using a KAFO in the locked position in extension.   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  She  did have a couple of episodes when she slipped or took a misstep.  She  felt a zinging down her arms.  Her left arm hurts more than the right  arm, although her foraminal stenosis is worse on the right than on the  left.  I do not think her ulnar neuropathy is really severe enough to be  causing some of her symptoms in the left upper extremity.   PLAN:  I agree with neurosurgical consultation.  She will see Dr. Jordan Likes  this week.  I will see her back in a couple of months.  She is good on  her medications at the current time.  She has had a trial of epidurals  C7 level which was not particularly helpful.      Erick Colace, M.D.  Electronically Signed     AEK/MedQ  D:  09/26/2007 11:50:40  T:  09/26/2007 12:05:26  Job #:  [id number]   cc:   Henry A. Pool, M.D.  Fax: [phone]   Guss Bunde  Fax: [phone]   Leim Fabry, M.D.

## 2010-09-15 NOTE — Assessment & Plan Note (Signed)
Andrea [name] returns today.  I saw her last on 07/18/2007.  In the  interval time she has undergone cervical epidural steroid injection per  Celene Kras, MD, 08/08/2007 targeting the C7-T1 trans-lamina approach.  She had no significant improvement as she did with prior epidurals.  Her  average pain preinjection is 5/10, post it is 4, current is 4 on  average.  She has had no progressive weakness, has had some overall  aching after doing some gardening this weekend, but this is all over her  body.  She can climb steps.  She drives.  She can walk a few minutes at  a time.  She needs assistance with bathing, meal prep, household duties,  shopping.  She uses KAFOs for ambulation, left lower extremity.   PHYSICAL EXAMINATION:  Blood pressure 134/74, pulse of 62, respirations  18, O2 SAT 99% on room air.  GENERAL:  No acute distress.  Mood and affect appropriate.  Her gait is with a limp.  She has as noted above has stiff legged gait  in the left lower extremity due to weakness from the polio.  Cervical radiculopathy.  She has no motor strength that fits.  Her mode  of strength is basically 5/5 bilateral deltoid, biceps and grip she has  4 on the left triceps versus 5 on the right side.  She has reduced  sensation, but more in the E8 dermatome on the left side.  It is noted  that she has a history of left ulnar neuropathy confirmed by EMG on  03/02/2007.   PLAN:  At this point, would not repeat epidural.  We will target her  nerve pain by starting Cymbalta 30 mg per day.  We will wean her off her  Lexapro of 10 mg daily x3 days then stop.   I will see her back in follow up.  Consider some physical therapy  traction at next visit.      Andrea [name], M.D.  Electronically Signed     AEK/MedQ  D:  08/29/2007 14:09:07  T:  08/29/2007 16:13:21  Job #:  [id number]   cc:   Leim Fabry, Dr.   Reita Chard  Fax: [phone]   Celene Kras, MD  Fax: [phone]

## 2010-09-15 NOTE — Assessment & Plan Note (Signed)
REASON FOR FOLLOW-UP:  Back pain, increasing weakness right lower  extremity.   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  She had a  history of polio affecting all 4 limbs as a child and has had mainly  left sided symptoms up until the last several months.  She has had  further workup of her right sided weakness including MRI of the brain  demonstrating no significant intracranial lesions.  She has had follow-  up with Dr. Neville Route at the post polio clinic at [facility] and per her report Dr. Clearnce Sorrel  feels that her progression of weakness as well as overall fatigue is  likely related to her post polio.  She has had no new problems since I  saw her.  Her left sacral iliac area is still relatively pain free as  compared with before the left sacral iliac injection on 07/18/06.  Her  pain level is around 6/10 today.   She can walk 5-10 minutes at a time.  She sometimes uses crutches.  She  always uses a left KAFO but has ordered a right KAFO.  Continues to  drive.  She uses a wheelchair in stores. 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  She cannot do her vacuuming or her mopping.  She has difficulty  going shopping at places that do not have a motorized scooter with a  cart on it.   REVIEW OF SYSTEMS:  As noted above, also some tremor and tingling in the  lower extremities.  She denies any pain shooting down the legs.   INTERVAL HISTORY:  Also positive for diagnosis of ductal breast CA that  was obtained today.  She is going to follow-up with her surgeon on this.  This is as per biopsy.  Blood pressure is 119/73, pulse 70, respirations  16, O2 sat 99% on room air.   PHYSICAL EXAMINATION:  No acute distress.  Mood and affect appropriate.  No pain over the PSIS area.  Her gait is  using KAFO, stiff legged gait  on the left side is expected.  She has essentially no strength in the  left lower extremity.  In the right lower extremity she is 5- in the hip  flexion, knee extension, ankle dorsiflexion, bilateral upper extremities  are 5-.  Her left lower extremities are atrophic.  She has trace deep  tendon reflexes and bilateral knees and ankles and 1+ bilateral upper  extremities.   IMPRESSION:  1. Left sacral iliac disorder, still doing well post injection.  2. Overall fatigue.  This may be a post pill syndrome although      certainly may be part of her diagnosis of breast carcinoma.   I will see her back in one month.  I will see how she is doing in  regards to her back.  She is contemplating going on disability through  her private insurance and then proceeding on to social security.      Erick Colace, M.D.  Electronically Signed    AEK/MedQ  D:  09/12/2006 11:11:54  T:  09/12/2006 11:33:30  Job #:  [id number]   cc:   Renda Rolls, MD   Guss Bunde  Fax: [phone]

## 2010-09-15 NOTE — Assessment & Plan Note (Signed)
The patient returns today.  She continues to have right shoulder and  upper trap pain, however, the trigger point injection helped for a  couple of weeks.  Her shoulder pain increases with reaching behind her  back as well as certain neck motions.  She has had an MRI showing  stenosis at C5-6 level, particularly more right sided then left.  She  has had no new gait or new bowel or bladder difficulties.   Her pain is averaging 4/10.  It interferes with activity at a moderate  level and with sleep.  Her sleep is rated as fair, the response to  medications is fair.  She does get some relief with heat as well as  rest.  She could walk with her KFOs which she does chronically because  of her post polio syndrome.  She climbs about 7-8 steps at a time.  She  drives.  She needs some help with bathing, household duties, and  shopping.  This is due a lot to her post polio syndrome.   REVIEW OF SYSTEMS:  Positive for numbness and tingling as well as  coughing more recently.   MEDICATIONS:  She tried taking the increased dose of Neurontin for the  pain going down her arm but this was causing too much drowsiness and she  went back to taking 100 mg t.i.d. and 300 q.h.s.   PHYSICAL EXAMINATION:  VITAL SIGNS:  Her blood pressure is 135/82, pulse  66, respirations 18, O2 sat is 98% on room air.  GENERAL:  No acute distress.  Mood and affect appropriate.  MUSCULOSKELETAL:  Her upper trap has tenderness on palpation mid  clavicular line and then at the base of the neck as well.  She has  negative impingement sign __________  .  She has 5 minus over 5 strength  on the left deltoid, biceps, triceps, grip and on the right 5/5  strength.  Sensation is mildly decreased in the left C8.  She has  positive Tinel's in the left elbow.   IMPRESSION:  1. Cervical myofascial pain.  2. 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  3. Left ulnar neuropathy at the elbow.  4. Post polio syndrome.   PLAN:  1. Continue Neurontin but change it to 100 b.i.d. and 200 q.h.s.  may      increase from there.  2. Send to Dr. Stevphen Rochester evaluate for cervical epidural.  3. I will do trigger point injection today, right upper trap.      Erick Colace, M.D.  Electronically Signed     AEK/MedQ  D:  05/25/2007 17:50:57  T:  05/25/2007 23:33:16  Job #:  [id number]

## 2010-09-18 NOTE — Assessment & Plan Note (Signed)
REASON FOR VISIT:  This is a followup appointment for buttock as well as  upper back pain.   HISTORY OF PRESENT ILLNESS:  The patient returns after I last saw her August 09, 2005. She has had increased physical activity over the last month with  gardening and has had some mild exacerbation of her buttocks pain but more  so in the upper trapezius area. Continues to smoke. We did a chest x-ray  given her fifth digit tingling on the left side but no evidence of apical  tumor noted. Her left pinky paresthesia has not progressed. She has no hand  weakness. She has some mild axial neck pain. Pain averaging 4 out of 10. No  pain with activity at 3 to 4 out of 10 level. Improves with rest. Increased  with walking and sitting. Relief with medications is about 30%. She can walk  20 minutes at a time. She is employed 36 hours a week. Needs some help with  heavier household duties.   PHYSICAL EXAMINATION:  GENERAL:  No acute distress.  VITAL SIGNS:  Blood pressure 138/73, pulse 80, respiratory rate 17, O2  saturation 98% on room air.  BACK:  Has mild PSIS tenderness on the right side.  EXTREMITIES:  Gait circumduction left lower extremity use a KAFO. She has  good hip range of motion  bilaterally.  NECK:  No tenderness midline but does have tenderness bilateral upper  trapezius.   IMPRESSION:  1.  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  2.  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   PLAN:  1.  Trigger point injection of her trapezius.  2.  Continue Flexeril 5 p.o. b.i.d.  3.  Continue Neurontin 100 mg q.i.d.  4.  Continue Celebrex 200 mg p.o. daily. She gets this from her primary care      physician and she takes about 2 Darvocet per month.   ADDENDUM:  Bilateral upper trapezius trigger point injection. Informed  consent was obtained after describing risks and benefits of the procedure to  the patient. She would like to  proceed. Areas marked and prepped with  Betadine and 25 gauge 1/2 inch needed inserted to approximately 1/2 depth.  Fan-like deactivation, 1 cc of Lidocaine injected into each site. The  patient tolerated the procedure well. She is to return in 2 months for  followup visit, potentially injection.      Erick Colace, M.D.  Electronically Signed     AEK/MedQ  D:  09/10/2005 13:16:10  T:  09/11/2005 14:20:20  Job #:  [id number]   cc:   Guss Bunde  Fax: [phone]   Leim Fabry, M.D.

## 2010-09-18 NOTE — Group Therapy Note (Signed)
REQUESTING PHYSICIAN:  Dr. Leim Fabry at [address] [place],  [place], [address].   HISTORY:  The patient is a 60 year old female who had onset of polio at age  88. She states that she was paralyzed from the neck down but gradually  improved so that her main weakness was in the left lower extremity. Has worn  a KAFO pretty much since that time. 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 She needs some occasional help with  bathing, i.e. her back, and at times with meal prep and certain household  duties. She feels that over the last several years she has had increasing  pain, most particularly in her low back/hip area as well as in her lower  extremities. She described a sharp burning pain, electrical-type sensation,  which affects her legs as well as her left-greater-than-right upper  extremity more on the medial portion. She grades her average pain as 4/10 to  5/10 and pain interfering with general activity at a 4/10 level,  relationship with other people at a 1/10 level, and enjoyment of life 6/10.  Her pain is worse during the evening when she gets home from work. Her sleep  is poor due to pain in her hips and shoulders. She states that the leg  burning pain has really come on since June 2006 and her arm burning pain in  October 2006. She has had repeat imaging studies including repeat lumbar MRI  in September 2006. In looking at orthopedic notes she has a broad-based  bulge at L4-5. She had an MRI of the cervical spine on May 11, 2005,  which I do have the report of demonstrating no evidence of high-grade  stenosis, no herniated nucleus pulposus, and AP dimension of the central  canal of greater than or equal to 12 mm. In addition, she has had cervical  MRI which was done on May 11, 2005, and compared to a study on March 13, 2003, and there was no  evidence of central canal stenosis and some  neural foramina encroachment moderate C5-6 asymmetric toward the left and C4-  5 toward the right with fact joint osteophyte on the right at that level.   She has been treated in the past for bursitis of the shoulder with some  improvement in her symptomatology. She has been treated for trochanteric  bursitis in the past which has also been helpful for her hip pain. She has  been seen in a pain clinic by Dr. Rubye Oaks at [facility]  and had two neck injections done - one was on July 10, 2003, which was  partially helpful, and a repeat one on July 24, 2003, which only had relief  during the local anesthetic phase i.e. immediately post injection. She had  follow-up April 2005 stating she had about 3/10 pain at that time in the  neck area and repeat injection was not recommended at that time. Her pain  was up to about 4/10 November 2003.   In terms of medication management, she has had multiple medications. She has  been tried on prednisone orally which did not help with her pain. In terms  of nonsteroidals, Relafen, Arthrotec, Voltaren were not helpful, but  currently Celebrex has been partially helpful. In terms of antidepressants,  she has tried Effexor, Paxil, and Wellbutrin which were not particularly  helpful. Desipramine caused  some euphoria and she could not function on it.   She has tried glucosamine, B6, B12 tablets - none of which have been  helpful. She has tried Neurontin at 300 mg to 600 mg dosages b.i.d. which  made her too groggy, and Lyrica also at 50 mg t.i.d. could not be tolerated  due to grogginess. Percocet and Roxicet have caused itching. Vicodin taking  one-half tablet of the 7.5 mg caused no itching.   CURRENT MEDICATIONS:  1.  Fosamax 70 mg weekly.  2.  Aspirin 81 mg p.o. daily.  3.  Toprol-XL 25 mg daily.  4.  Celebrex 200 mg p.o. daily.  5.  Norvasc 5 mg p.o. daily.  6.  Docusate sodium 100 mg two  p.o. daily.  7.  Hydrochlorothiazide 25 mg p.o. daily.  8.  Nexium 40 mg p.o. daily.  9.  Femring 1 mg every 3 months.  10. Propoxyphene 650 mg p.o. p.r.n. She does not take this on a daily basis      but several times a week.  11. Flexeril 10 mg one-half tablet at diner and one-half tablet at bedtime.   OTHER PAST MEDICAL HISTORY:  1.  Hypertension.  2.  GERD.   FAMILY HISTORY:  Hypertension, lung CA, emphysema. Father died at age 52.  Mother is alive at age 21 with hypertension, rheumatoid arthritis, elevated  blood sugar, and osteoporosis. Diagnosed with right lung cancer in October  2006 and diabetes November 2006. She has a sister with rheumatic heart  disease as a child, hypertension, as well as elevated blood sugars.   REVIEW OF SYSTEMS:  Positive for numbness, tremor, tingling exacerbated by  increased usage of the upper extremities, weakness, reflux, constipation.   PAST MEDICAL HISTORY:  Total hysterectomy October 1996, carpal tunnel  releases June 2000 and July 2001, bilateral thumb arthroplasties in 2001 and  2002, fifth toe fusion approximately 30 years ago, left foot fusion about 20  years ago. She has had cystocele and rectocele repair April 2006. She has  had corrective surgeries as a child, one on her right knee to enhance  closing of growth plates. Tonsillectomy as a child.   SOCIAL HISTORY:  Married, lives with her husband. Smokes one-and-a-half to  two packs a day and drinks about a six pack per week.   EQUIPMENT UTILIZED:  Uses crutches from time to time for ambulation. She has  a motorized wheelchair on order from [employer]. She uses  a KAFO and can only ambulate without it by using crutches.   Blood pressure is 144/79, pulse 89, respirations 16, O2 saturation 96% in  room air.   In general, no acute distress, mood and affect appropriate. Her motor  strength is 5/5 bilateral deltoids, biceps, triceps, grip. In the right lower extremity  she has 4/5 right hip flexion, knee extension, ankle  dorsiflexion, and ankle plantar flexion. On the left lower extremity she has  3- in the hip flexors, 0 at the knee extensors, 0 at the foot and ankle.   Sensation is normal in the lower extremities. In the upper extremities she  has decreased sensation in the left fifth digit compared to the right fifth  digit.   She has some mild tenderness to palpation at the right greater than left  trochanter. Her Faber's is positive on the right in the hip and side joint  area.   IMPRESSION:  1.  Post polio syndrome.  2.  Chronic low back pain, right  side greater than left side, with hip and      buttock pain. Exam consistent with sacroiliac joint arthropathy and      given her asymmetric gait due to chronic use of KAFO, feel that this      joint would be at increased risk.  3.  Trochanteric bursitis, chronic, recurrent bilateral hips.  4.  History of subacromial bursitis, does not appear to be active at this      time.  5.  Left upper extremity ulnar neuropathy versus thoracic outlet syndrome.  6.  Neurogenic pain lower extremities, question etiology. No history of      diabetes.   PLAN:  1.  Given her main symptomatic complaints is left hip/buttock pain as well      as bilateral leg burning and numbness, will address those first. Will      first do diagnostic/therapeutic injections right sacroiliac joint under      fluoroscopic guidance.  2.  Revisit Neurontin. She has had some beneficial effect from it but had      nontolerable side effects at the 300 mg dosage. Will start it with 100      mg q.h.s. and every 7 days increase it by 100 mg up to four times a day,      monitor for side effects versus lowest effective dose.  3.  Will continue Darvocet as is but may need to switch to hydrocodone 5 mg      one-half tablet b.i.d.  4.  I will see her back for the injection, follow up on her Neurontin      dosage.  5.  She will need physical  therapy, likely trial of TENS unit. Would like      her to see neurology regarding her history of post polio.      Erick Colace, M.D.  Electronically Signed     AEK/MedQ  D:  06/11/2005 13:21:51  T:  06/11/2005 15:17:13  Job #:  [id number]   cc:   Leim Fabry, MD  [address]  [place], [place] [address]   Neville Route, MD  [facility]  [place], [place]   Reita Chard  Fax: [phone]   Suzan Slick, MD  Neurology  [facility]

## 2010-09-18 NOTE — Assessment & Plan Note (Signed)
The patient was last seen by me November 02, 2005.  She has a history of polio  myelitis, walks with a KAFO.  She has been seen for post polio syndrome.  Her main complaint at this time is increasing right upper extremity  weakness, numbness, mainly in the fourth and fifth digits.  She denies any  trauma or falls.  She states that this came on by itself.  She did have some  lateral epicondylitis type discomfort and has had some relief using this.  She states that the numbness is in her entire arm including her hand.  She  has occasional nocturnal paresthesias as well.  She denies any significant  neck pain.   MEDICATIONS:  1. Neurontin 100 mg q.i.d.  2. Darvocet, takes about 2 tablets per day.  3. Celebrex 200 mg a day.  She increased it on her own to twice a day.  4. Flexeril 5 b.i.d.   She continues to work.  Her pain level is a 5/10.  Her sleep is poor.  Ambulates with her leg brace about 20 minutes at a time.  She needs some  help washing her back and household duties and shopping but otherwise  independent.  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   PHYSICAL EXAMINATION:  VITAL SIGNS:  Her blood pressure is 130/75, pulse 82,  respirations 17, O2 sat 100% on room air.  MUSCULOSKELETAL:  Her right elbow has some tenderness over the lateral  epicondyle.  This, however, causes some pain and numbness in the fourth and  fifth digits.  She has a negative Tinel sign over the ulnar groove.  Negative Phalen's.  HEENT:  She complains of some perioral numbness at times as well.  NECK:  Good range of motion.  Negative Spurling's.  And, normal deep tendon  reflexes.   IMPRESSION:  1. Right lateral epicondylitis.  2. Intermittent paresthesias, ulnar distribution.  I do not see any signs      of ulnar neuropathy.  I do not think an EMG would be particularly      helpful at this time given the intermittent nature of these symptoms.  3. She has some accompanying perioral numbness and taken  as a whole, I      would like for her to see a neurologist to see whether this could      represent some transient ischemic attack, vertebrobasilar system.   In the interval, I will start her with some physical therapy for her lateral  epicondylitis and also schedule for her lateral epicondyle injection.      Erick Colace, M.D.  Electronically Signed     AEK/MedQ  D:  02/01/2006 14:30:47  T:  02/02/2006 15:39:46  Job #:  [id number]   cc:   Fransisca Connors  Fax: [phone]   Grandville Silos, Dr.  [facility]

## 2010-09-18 NOTE — Assessment & Plan Note (Signed)
Miss Hlavac returns today after I last saw her July 18, 2006 for left  sacroiliac joint injection with fluoroscopic guidance. She has had very  good relief of her left-sided buttock pain, in fact it is still a 0 out  of 10 down from 4 to 8 out of 10. In the interval time however she has  developed more of an all over body aching and fatigue. Not only does her  left leg feel weak but her right leg feels like it gives away from time  to time. She has had an MRI of the brain showing some periventricular  small vessel disease but no other significant findings. She has seen a  neurologist and does not have any follow up with him. She does see Dr.  Clearnce Sorrel at the [facility] next month. She has had some numbness  around the facial region as well as the neck but no facial weakness.   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 She can walk 20 minutes at time, sometimes uses a wheelchair,  needs some assistance with bathing, meal prep, and household duties.   REVIEW OF SYSTEMS:  Positive for dizziness, depression, but no suicidal  thoughts, does have numbness, tingling, and trouble walking as noted  above.   SOCIAL HISTORY:  Married, lives with her husband.   Vocational as above.   PHYSICAL EXAMINATION:  Blood pressure 127/71, pulse 74, respiratory rate  16, O2 sat 99% in room air.  IN GENERAL: No acute distress, mood and affect appropriate.  Her back has no tenderness to palpation over the sacroiliac area or  thePSIS.  She walks with a KAFO and left lower extremity stiff legged gait, good  toe clearance bilaterally. Right lower extremity strength is 5-/5 as is  right upper extremity and left upper extremity. Left lower extremity has  straight knee extension. She has atrophic left lower extremity.   Deep tendon reflexes are trace bilateral lower extremities and 1 +  bilateral upper extremities.   IMPRESSION:  1. 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  2. Overall fatigue and bilateral lower extremity weakness, can not      pick up much on examination. 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 Clearnce Sorrel      next month and he will further evaluate.      Erick Colace, M.D.  Electronically Signed     AEK/MedQ  D:  08/15/2006 12:44:53  T:  08/15/2006 13:48:25  Job #:  [id number]   cc:   Guss Bunde  Fax: [phone]

## 2010-09-18 NOTE — Assessment & Plan Note (Signed)
REASON FOR VISIT:  Left hip pain, right elbow pain.   HISTORY:  The patient was last seen by me on Sep 10, 2005.  At that time,  she had myofascial pain in the trapezius area.  This had been relieved with  trigger point injection to that area.  She has also had an episode where she  was lifting something out of her husband's truck and hurt her right elbow.  She has used a tennis elbow brace provided by her primary physician and this  has been helpful for her.  In addition, she has noted some increased left  hip pain.  This is more lateral than her usual buttock pain.   She notes no injury at that time.   CURRENT MEDICATIONS:  1.  Neurontin 100 mg q.i.d.  2.  Darvocet.  She only takes a few tablets per month, prescribed by her      primary physician.  3.  Flexeril 5 b.i.d.  4.  Celebrex 200 mg p.o. daily.   PHYSICAL EXAMINATION:  GENERAL:  In no acute distress.  BP is 137/77, pulse  79, respiratory rate 16, O2 sat 98% on room air.  Her pain is averaging  5/10, currently 3/10, increasing with prolonged standing and sitting.  Her  left troch. bursa is tender to palpation.  The right troch. bursa is not  tender.  She has no tenderness over the PSIS area.  She has tenderness over  the right lateral epicondyle.  Her gait is stiff-legged.  She uses a left  KAFO with a locking knee hinge.  She has mild atrophy in the left forearm  compared to the right side.   IMPRESSION:  1.  Left trochanteric bursitis.  2.  History of right sacroiliac joint arthropathy, currently not      particularly active.  3.  Right lateral epicondylitis, improving.   PLAN:  We will do a left troch. bursa injection given that she is already on  Celebrex and this has been going on for at least 2 weeks.   I will see her back in three months.  We will continue current medications  listed above.   ADDENDUM:  Left troch. bursa injection.   An informed consent was obtained.  The area was marked and prepped with  Betadine and entered with a 25-gauge, 1-1/2-inch needle to bone contact,  slightly withdrawn and injected into the fan-like pattern.  Solution  containing 1  mL of 40 mg/mL of Kenalog with 2 mL of 1% lidocaine.  The patient tolerated  the procedure well.  Injection done after negative drawback for blood.      Erick Colace, M.D.  Electronically Signed     AEK/MedQ  D:  11/02/2005 09:39:07  T:  11/02/2005 10:11:39  Job #:  [id number]   cc:   Leim Fabry, M.D.  [address]  [place], [place] [address]   Guss Bunde, M.D.  [address]  [place], [place] [address]

## 2010-09-18 NOTE — Assessment & Plan Note (Signed)
HISTORY OF PRESENT ILLNESS:  This is a 60 year old female has chronic low  back pain right greater than left side.  She is scheduled for sacroiliac  injection.  However, she states that her pain in her lower back is currently  2/10 and generally has been doing better since ramping upward on the  Neurontin.  Is up to 100 mg q.i.d.  She rarely takes Darvocet about once a  week.   She has had no new interval medical history changes.  Pain interference  score is 2/10.  She can walk 20 minutes at a time, drive.  She uses crutches  at night.  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  Still has some limb swelling with standing in the lower  extremities and chronic constipation.   PHYSICAL EXAMINATION:  GENERAL APPEARANCE:  No acute distress.  Mood and  affect appropriate.  She has good forward flexion.  Is able to touch her  toes.  Tension is limited by her balance, but with steadying, she can extend  normal range.  Faber's testing is positive right SI joint.  Gait is with KFO  , stiff leg and gait and extension of left lower extremity.  Some  compensatory hip hiking.   IMPRESSION:  Right sacroiliac joint arthropathy, actually doing better  overall.  Question whether this is related to her Neurontin dosage versus  more activity related.  She states she has done nothing to flare it up  recently, and in fact, only had significant pain about two weeks ago when  she was up and down on a step ladder painting a room.   PLAN:  1.  Will hold off on SI joint injection.  2.  Continue Neurontin 100 mg q.i.d.  3.  I will see her back in about 6 weeks.  If between now and then she has      more exacerbation of her pain, would like to get her in as an add on for      sacroiliac injection.      Erick Colace, M.D.  Electronically Signed     AEK/MedQ  D:  07/08/2005 17:08:57  T:  07/09/2005 12:49:09  Job #:  [id number]   cc:   Guss Bunde  Fax:  [phone]   Jimmie Molly, [place]

## 2010-09-18 NOTE — Assessment & Plan Note (Signed)
The patient follows up today.  She had a sacroiliac injection on the  left on June 13, 2006.  This improved her pain level for a few  weeks.  However, her pain seems to be recurring right now.  She has had  no new injuries.  She continues to take Darvocet, Celebrex, and  Flexeril, but her primary care has started her on some hydrocodone as  well.  We are not prescribing medications at this clinic for her at the  current time.   She also has some right ring finger numbness and pain, which is  intermittent in nature.  No trauma.  Does a lot of keyboard entry at  work.   REVIEW OF SYSTEMS:  Positive for depression, anxiety.  No suicidal  thoughts.   PHYSICAL EXAMINATION:  VITAL SIGNS:  Her blood pressure is 132/60, pulse  66, respiratory rate 16, O2 sat 100% on room air.  GENERAL:  No acute distress.  Mood and affect appropriate.  Orientation  is times 3.  GAIT:  Stiff legged using left KAFO in a locked position.  EXTREMITIES:  She has no active quad in the left lower extremity.  She  has trace toe flexion.  She has atrophy in the left lower extremity.  Left PSI of tenderness to palpation.  Right lower extremity strength is  full.  Upper extremity strength is full.  Sensation is normal in the  upper extremities.  Negative Tinel's over the cubital tunnel on the  right side.  NECK:  Good range of motion without pain.   IMPRESSION:  1. Left sacroiliac disorder secondary to her left lower extremity      monoplegia.  Abnormal gait.  2. 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  Could do EMG testing.  However, this is not a severely      disabling problem at this time and we will continue to monitor.   PLAN:  1. Given that the right SI joint injection was helpful but the effects      are waning, will repeat injection in 1 to 2 weeks.  2. Dr. Dayna Barker to write medication  prescriptions.      Erick Colace, M.D.  Electronically  Signed     AEK/MedQ  D:  07/11/2006 09:22:35  T:  07/11/2006 09:38:54  Job #:  [id number]   cc:   Leim Fabry, MD

## 2010-09-18 NOTE — Assessment & Plan Note (Signed)
The patient returns.  I last saw her for trigger point injection done on  July 08, 2005.  She has had good relief of her buttock pain.  She states  that there is a pain going down from the left axilla, medial elbow to the  medial forearm and occasionally into the little finger has started up.  She  has had this in the past over the last several months.  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  She has had a  thoracic MRI as well which was completely normal.   She is a smoker.  She had her last chest x-ray one year ago.  She is rating  her average pain as a 3/10 and this pertains to the pain in her low back,  bilateral upper tract and her lower extremities below the knee.  Her walking  tolerance is 20 minutes.  She is able to climb steps.  She drives.  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  She does need  assistance with meal prep and household duties but even does some gardening.   PHYSICAL EXAMINATION:  VITAL SIGNS:  Blood pressure 127/80, pulse 70,  respirations 18, O2 saturation 99% on room air.  GENERAL:  No acute distress.  Mood and affect are appropriate.  BACK:  No tenderness to palpation.  She does have some soreness on the right  paraspinal far lateral with left and right side with bending.  She has good  forward flexion and extension but needs to be supported.  She has severe  left quadriceps weakness, uses a KAFO.  MUSCULOSKELETAL:  With her gait, she circumducts the left lower extremity  leaning over the right side during each step.  Her hip range of motion is  good bilaterally.  In the upper extremities, she has mild reduction in  sensation left fifth digit compared to the right side.  She has no evidence  of intrinsic atrophy.  She has normal strength bilateral intrinsics, hand  grip, biceps, triceps, and deltoid.   IMPRESSION:  1.  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  2.  Left upper extremity pinky paraesthesia, no apparent motor involvement.      She has had no corresponding imaging studies to explain symptoms on her      cervical and thoracic MRI.  Given that she is a smoker, would like to      repeat her chest x-ray to rule out a Pancoast tumor.  If negative and      symptoms become more troublesome, would do EMG left upper extremity.   I will see her back in one to two months.  Have the chest x-ray results also  sent to her primary physician, Dr. Dayna Barker.      Erick Colace, M.D.  Electronically Signed     AEK/MedQ  D:  08/09/2005 13:04:44  T:  08/09/2005 23:17:45  Job #:  [id number]   cc:   Leim Fabry, M.D.  [place], [place]   [place]  Fax: [phone]

## 2010-09-18 NOTE — Procedures (Signed)
NAMEELLEE, WAWRZYNIAK                 ACCOUNT NO.:  [id number]   MEDICAL RECORD NO.:  [id number]          PATIENT TYPE:  REC   LOCATION:  [facility]                          FACILITY:  [facility]   PHYSICIAN:  Erick Colace, M.D.DATE OF BIRTH:  06/20/1950   DATE OF PROCEDURE:  07/18/2006  DATE OF DISCHARGE:                               OPERATIVE REPORT   PROCEDURE PERFORMED:  Left sacroiliac joint injection under fluoroscopic  guidance.   INDICATIONS FOR PROCEDURE:  Sacroiliac joint arthropathy, previous  relief with injection, 06/13/2006.   Pain is rated at 4/10 at rest, 8/10 with activity.  Pain only partially  responsive to medication management.   Informed consent was obtained after describing the risks and benefits of  the procedure to the patient.  These include bleeding, bruising  infection or lower extremity weakness.  She agrees to proceed and has  given consent.  Patient placed prone on fluoroscopy table.  Betadine  prep, sterile drape, 25 gauge 1-1/2 inch needle was used to anesthetize  skin and subcutaneous tissue 1% lidocaine x2 mL, 25 gauge 2-1/2 inch  spinal needle was inserted in the left SI joint under fluoroscopic  guidance AP and lateral imaging, Omnipaque 180 x 0.5 mL demonstrated no  intravascular uptake and good joint outline followed by injection of  solution containing 1 mL of 2% MPF lidocaine and 0.5 mL of 40 mg per mL  Depo-Medrol.  The patient tolerated the procedure well. Pre and post  injection vitals stable.      Erick Colace, M.D.  Electronically Signed     AEK/MEDQ  D:  07/18/2006 17:07:58  T:  07/19/2006 11:05:38  Job:  [id number]

## 2010-09-18 NOTE — Procedures (Signed)
Andrea [name], Andrea [name]                 ACCOUNT NO.:  [id number]   MEDICAL RECORD NO.:  [id number]          PATIENT TYPE:  REC   LOCATION:  [facility]                          FACILITY:  [facility]   PHYSICIAN:  Erick Colace, M.D.DATE OF BIRTH:  10-26-1950   DATE OF PROCEDURE:  06/13/2006  DATE OF DISCHARGE:                               OPERATIVE REPORT   Left sacroiliac injection.   INDICATIONS:  Left PSIS pain hip.  Hip x-rays reportedly negative, has  left buttock pain unresponsive to medication management including  Darvocet, Celebrex, Flexeril.   Informed consent was obtained after describing risks and benefits of  procedure to the patient including bleeding, bruising, infection, lower  extremity weakness.  She elected to proceed and has given written  consent.  The patient placed prone on fluoroscopy table.  Betadine prep,  sterile drape.  25 gauge 1-1/2 inch needle was used to anesthetize skin  and subcu tissue with 1% lidocaine x2 mL.  A 22 gauge 3- 1/2 inch spinal  needle was inserted under fluoroscopic guidance into the left sacroiliac  joint.  AP lateral and oblique imaging utilized.  Omnipaque 180 x 0.5 mL  demonstrated no intravascular uptake and 0.5 mL of 41 mg per mL Depo-  Medrol mixed with 1 mL of 2% MPF lidocaine was injected.  The patient  procedure well.  Pre and post injection vitals stable.  Pre injection  pain level 3, post injection pain level 0.  Return in 1 month follow-up  appointment.      Erick Colace, M.D.  Electronically Signed     AEK/MEDQ  D:  06/13/2006 13:01:58  T:  06/13/2006 14:17:33  Job:  [id number]

## 2010-09-21 ENCOUNTER — Ambulatory Visit: Payer: Medicare Other | Admitting: Physical Medicine & Rehabilitation

## 2010-09-21 ENCOUNTER — Encounter: Payer: Medicare Other | Attending: Physical Medicine & Rehabilitation

## 2010-09-21 DIAGNOSIS — Z981 Arthrodesis status: Secondary | ICD-10-CM | POA: Insufficient documentation

## 2010-09-21 DIAGNOSIS — M47812 Spondylosis without myelopathy or radiculopathy, cervical region: Secondary | ICD-10-CM

## 2010-09-21 DIAGNOSIS — IMO0001 Reserved for inherently not codable concepts without codable children: Secondary | ICD-10-CM | POA: Insufficient documentation

## 2010-09-21 DIAGNOSIS — M545 Low back pain, unspecified: Secondary | ICD-10-CM | POA: Insufficient documentation

## 2010-09-21 DIAGNOSIS — R209 Unspecified disturbances of skin sensation: Secondary | ICD-10-CM | POA: Insufficient documentation

## 2010-09-21 DIAGNOSIS — M79609 Pain in unspecified limb: Secondary | ICD-10-CM | POA: Insufficient documentation

## 2010-09-21 DIAGNOSIS — M25559 Pain in unspecified hip: Secondary | ICD-10-CM | POA: Insufficient documentation

## 2010-09-21 DIAGNOSIS — M961 Postlaminectomy syndrome, not elsewhere classified: Secondary | ICD-10-CM | POA: Insufficient documentation

## 2010-09-22 NOTE — Procedures (Signed)
[name], Andrea [name]                 ACCOUNT NO.:  [id number]  MEDICAL RECORD NO.:  [id number]           PATIENT TYPE:  O  LOCATION:  [facility]                          FACILITY:  [facility]  PHYSICIAN:  Erick Colace, M.D.DATE OF BIRTH:  23-Dec-1950  DATE OF PROCEDURE: DATE OF DISCHARGE:                              OPERATIVE REPORT  PROCEDURE:  Right C3 and right C4 medial branch block under fluoroscopic guidance.  INDICATION:  Cervical pain above the level of C5-6 fusion.  Pain is only partially response to medication management including narcotic analgesic 8/10.  Informed consent was obtained after describing risks and benefits of the procedure.  These include bleeding, bruising, and infection.  She elects to proceed and has given written consent.  She has completed antibiotics for bronchitis 3 days ago and has been afebrile.  She is not taking any anticoagulants.  Informed consent was obtained after describing risks and benefits of the procedure with the patient.  These include bleeding, bruising, and infection.  She elects to proceed and has given written consent.  The patient placed in a left lateral decubitus position.  Right side of the neck was prepped with Betadine.  Skin wheal raised to 27 gauge 5 Ace inch needle, 0.5 mL of lidocaine injected into each two sites followed by insertion of 25 gauge 1-1/2-inch needle inserted to bone contact, midpoint of the articular pillar confirmed in PA view.  After negative drawback for blood, 0.4 mL of 1% MPF lidocaine injected.  The same procedure was repeated at the C3 level using same needle injectate and technique using separate access points.  The patient tolerated procedure well.  Postprocedure instructions given.  Pre injection pain level of 4, post injection 2.  She does have pain that increases with activities so that she will monitor during neck motions.     Erick Colace, M.D. Electronically Signed    AEK/MEDQ  D:   09/21/2010 13:20:07  T:  09/22/2010 00:07:34  Job:  [id number]

## 2010-09-27 ENCOUNTER — Ambulatory Visit: Payer: Self-pay | Admitting: Specialist

## 2010-10-13 ENCOUNTER — Ambulatory Visit: Payer: Self-pay | Admitting: Specialist

## 2010-10-13 DIAGNOSIS — I10 Essential (primary) hypertension: Secondary | ICD-10-CM

## 2010-10-21 ENCOUNTER — Ambulatory Visit: Payer: Self-pay | Admitting: Specialist

## 2010-11-02 ENCOUNTER — Encounter: Payer: Medicare Other | Attending: Physical Medicine & Rehabilitation

## 2010-11-02 ENCOUNTER — Ambulatory Visit: Payer: Medicare Other | Admitting: Physical Medicine & Rehabilitation

## 2010-11-02 DIAGNOSIS — IMO0001 Reserved for inherently not codable concepts without codable children: Secondary | ICD-10-CM | POA: Insufficient documentation

## 2010-11-02 DIAGNOSIS — M545 Low back pain, unspecified: Secondary | ICD-10-CM

## 2010-11-02 DIAGNOSIS — G894 Chronic pain syndrome: Secondary | ICD-10-CM

## 2010-11-02 DIAGNOSIS — M961 Postlaminectomy syndrome, not elsewhere classified: Secondary | ICD-10-CM | POA: Insufficient documentation

## 2010-11-02 DIAGNOSIS — M79609 Pain in unspecified limb: Secondary | ICD-10-CM | POA: Insufficient documentation

## 2010-11-02 DIAGNOSIS — E559 Vitamin D deficiency, unspecified: Secondary | ICD-10-CM | POA: Insufficient documentation

## 2010-11-02 DIAGNOSIS — Z981 Arthrodesis status: Secondary | ICD-10-CM | POA: Insufficient documentation

## 2010-11-02 DIAGNOSIS — M542 Cervicalgia: Secondary | ICD-10-CM

## 2010-11-02 DIAGNOSIS — K219 Gastro-esophageal reflux disease without esophagitis: Secondary | ICD-10-CM | POA: Insufficient documentation

## 2010-11-02 DIAGNOSIS — R209 Unspecified disturbances of skin sensation: Secondary | ICD-10-CM | POA: Insufficient documentation

## 2010-11-02 DIAGNOSIS — M47812 Spondylosis without myelopathy or radiculopathy, cervical region: Secondary | ICD-10-CM | POA: Insufficient documentation

## 2010-11-02 DIAGNOSIS — M25559 Pain in unspecified hip: Secondary | ICD-10-CM | POA: Insufficient documentation

## 2010-11-02 DIAGNOSIS — E7800 Pure hypercholesterolemia, unspecified: Secondary | ICD-10-CM | POA: Insufficient documentation

## 2010-11-02 DIAGNOSIS — M2550 Pain in unspecified joint: Secondary | ICD-10-CM | POA: Insufficient documentation

## 2010-11-03 NOTE — Assessment & Plan Note (Signed)
HISTORY OF PRESENT ILLNESS:  This is a patient of Dr. Wynn Banker who is followed up here for neck and back pain.  She sees him generally for injections and was last seen on Sep 21, 2010 for cervical blocks which she states she got about 4-5 days relief from.  She does not want anymore intervention at this time.  She is here for medicine refill. She just had a right rotator cuff surgery about 2 weeks ago with Dr. Reita Chard in [place].  She rates her pain at about 5.  It is a sharp pain with some paresthesias like feelings.  Her general activity level is 1-3.  Pain is worse in the morning and evening.  Rest and heat tend to help with the medication.  FUNCTIONALITY:  She is independent.  REVIEW OF SYSTEMS:  Notable for those difficulties as stated above as well as some generalized weakness.  PAST MEDICAL HISTORY:  Unchanged.  SOCIAL HISTORY:  Unchanged.  FAMILY HISTORY:  Unchanged.  PHYSICAL EXAMINATION:  VITAL SIGNS:  Blood pressure 116/68, pulse 66, respirations 18, and O2 sats 97 on room air. NEUROLOGIC:  Motor strength in her lower extremities is good.  Sensation is intact.  Upper extremities not tested due to the right shoulder still being in a sling and immobile.  Constitutionally, she is within normal limits.  She is alert and oriented x3.  She has a slight limp.  IMPRESSION: 1. Cervicalgia. 2. Chronic low back pain. 3. Rotator cuff syndrome with recent surgery  PLAN:  We had refilled her hydrocodone 5/500 one p.o. t.i.d. 90 with no refill.  She will follow up with Dr. Wynn Banker in 3 months.  Her questions were encouraged and answered.  She will follow up with Dr. Katrinka Blazing regarding her shoulder.     Fortino Haag L. Blima Dessert Electronically Signed    RLW/MedQ D:  11/02/2010 11:03:19  T:  11/03/2010 00:31:12  Job #:  [id number]

## 2011-01-07 ENCOUNTER — Ambulatory Visit: Payer: Medicare Other | Admitting: Physical Medicine & Rehabilitation

## 2011-01-07 ENCOUNTER — Encounter: Payer: Medicare Other | Attending: Physical Medicine & Rehabilitation

## 2011-01-07 DIAGNOSIS — Z981 Arthrodesis status: Secondary | ICD-10-CM | POA: Insufficient documentation

## 2011-01-07 DIAGNOSIS — M533 Sacrococcygeal disorders, not elsewhere classified: Secondary | ICD-10-CM

## 2011-01-07 DIAGNOSIS — M545 Low back pain, unspecified: Secondary | ICD-10-CM | POA: Insufficient documentation

## 2011-01-07 DIAGNOSIS — M25559 Pain in unspecified hip: Secondary | ICD-10-CM | POA: Insufficient documentation

## 2011-01-07 DIAGNOSIS — M961 Postlaminectomy syndrome, not elsewhere classified: Secondary | ICD-10-CM | POA: Insufficient documentation

## 2011-01-07 DIAGNOSIS — R209 Unspecified disturbances of skin sensation: Secondary | ICD-10-CM | POA: Insufficient documentation

## 2011-01-07 DIAGNOSIS — IMO0001 Reserved for inherently not codable concepts without codable children: Secondary | ICD-10-CM | POA: Insufficient documentation

## 2011-01-07 DIAGNOSIS — M47812 Spondylosis without myelopathy or radiculopathy, cervical region: Secondary | ICD-10-CM | POA: Insufficient documentation

## 2011-01-07 DIAGNOSIS — M79609 Pain in unspecified limb: Secondary | ICD-10-CM | POA: Insufficient documentation

## 2011-01-11 NOTE — Procedures (Signed)
NAMEWHITNIE, DELEON NO.:  [id number]  MEDICAL RECORD NO.:  [id number]           PATIENT TYPE:  LOCATION:                                 FACILITY:  PHYSICIAN:  Erick Colace, M.D.DATE OF BIRTH:  Sep 08, 1950  DATE OF PROCEDURE:  01/07/2011 DATE OF DISCHARGE:                              OPERATIVE REPORT  PROCEDURE:  Bilateral sacroiliac injection under fluoroscopic guidance.  INDICATION:  Buttocks pain that radiates into the thigh.  Right greater than left side, average pain is 5/10 despite medication management, interferes with activities such as walking and standing.  Pain persist despite medication management and other conservative care.  The patient was placed prone on fluoroscopy table.  Betadine prep, sterile drape, a time-out taken to assure proper patient and procedure.  A 25-gauge inch and half needle was used to anesthetize the skin and subcutaneous tissue with 1% lidocaine x2 mL.  Then, a 25-gauge 3-inch spinal needle was inserted under fluoroscopic guidance into the left SI joint.  AP, lateral, and oblique images utilized.  Omnipaque 180 x 0.5 mL demonstrated no intravascular uptake.  Then, 0.5 mL of 140 mg of dexamethasone and 1 mL of 2% lidocaine were injected.  This same procedure was repeated on the right side using same needle injectate and technique.  The patient tolerated the procedure well.  Pre- and post- injection vitals stable.  Post injection instructions given.     Erick Colace, M.D. Electronically Signed    AEK/MEDQ  D:  01/07/2011 10:49:16  T:  01/07/2011 11:20:17  Job:  [id number]  cc:   Dr. Leim Fabry

## 2011-01-26 ENCOUNTER — Ambulatory Visit: Payer: Medicare Other | Admitting: Physical Medicine & Rehabilitation

## 2011-01-26 DIAGNOSIS — M76899 Other specified enthesopathies of unspecified lower limb, excluding foot: Secondary | ICD-10-CM

## 2011-01-26 DIAGNOSIS — M533 Sacrococcygeal disorders, not elsewhere classified: Secondary | ICD-10-CM

## 2011-01-27 NOTE — Assessment & Plan Note (Signed)
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  She has had good relief with the SI injection performed last month.  That has helped some of her right-sided groin pain.  She is now having left-sided hip pain.  She has had bursitis in the past.  Her pain is in the 3-4/10 range despite narcotic analgesic medications.  REVIEW OF SYSTEMS:  Weakness, tingling and trouble walking.  Examination, she has tenderness over the left greater trochanter.  She has no tenderness over the PSIS bilaterally.  Blood pressure 129/76, pulse 64, respirations 18 and O2 sat 98% room air.  IMPRESSION: 1. Bilateral sacroiliac pain improved after injections. 2. Left trochanteric bursitis.  We will do a trochanteric bursa     injection.  I will see her back in 3 months for repeat SI injections.  Discussed with the patient, agrees with plan.  We will continue hydrocodone 5/325 t.i.d.     Erick Colace, M.D. Electronically Signed    AEK/MedQ D:  01/26/2011 11:51:03  T:  01/26/2011 13:09:03  Job #:  [id number]

## 2011-01-27 NOTE — Procedures (Signed)
[name], Andrea [name]                 ACCOUNT NO.:  [id number]  MEDICAL RECORD NO.:  [id number]           PATIENT TYPE:  O  LOCATION:  [facility]                          FACILITY:  [facility]  PHYSICIAN:  Tahesha Skeet L. Sharian Delia, ANP-CDATE OF BIRTH:  11/08/1950  DATE OF PROCEDURE:  01/26/2011 DATE OF DISCHARGE:                              OPERATIVE REPORT  Patient of Dr. Wynn Banker came in today with complaints of left hip pain. He saw the patient in consult and had me to inject her hip.  After informed consent, the above risks and benefits were explained to the patient, Betadine prep just posterior to the left greater trochanter and with France Ravens, CNA, in the room, we injected her with 4 mL of lidocaine, 1 mL of 40 mg Depo-Medrol.  There was no blood drawback.  She tolerated it well.  She knows to ice the area tonight.  She will follow up with Dr. Wynn Banker as scheduled.     Andrea [name] L. Blima Dessert Electronically Signed    RLW/MEDQ  D:  01/26/2011 13:15:21  T:  01/26/2011 21:11:46  Job:  [id number]

## 2011-01-28 LAB — BASIC METABOLIC PANEL
BUN: 9
Calcium: 9.4
Creatinine, Ser: 0.48
GFR calc non Af Amer: >60
Glucose, Bld: 79
Sodium: 137

## 2011-01-28 LAB — DIFFERENTIAL
Basophils Absolute: 0
Lymphocytes Relative: 20
Neutro Abs: 4.6
Neutrophils Relative %: 68

## 2011-01-28 LAB — TYPE AND SCREEN
ABO/RH(D): A POS
Antibody Screen: NEGATIVE

## 2011-01-28 LAB — CBC
Platelets: 254
RDW: 13.5

## 2011-03-17 ENCOUNTER — Ambulatory Visit: Payer: Self-pay | Admitting: Family Medicine

## 2011-04-05 ENCOUNTER — Ambulatory Visit: Payer: Medicare Other | Admitting: Physical Medicine & Rehabilitation

## 2011-04-05 ENCOUNTER — Encounter: Payer: Medicare Other | Attending: Physical Medicine & Rehabilitation

## 2011-04-05 DIAGNOSIS — M533 Sacrococcygeal disorders, not elsewhere classified: Secondary | ICD-10-CM

## 2011-04-05 DIAGNOSIS — R269 Unspecified abnormalities of gait and mobility: Secondary | ICD-10-CM | POA: Insufficient documentation

## 2011-04-05 DIAGNOSIS — B91 Sequelae of poliomyelitis: Secondary | ICD-10-CM | POA: Insufficient documentation

## 2011-04-05 NOTE — Procedures (Signed)
[name], SHUFFLER                 ACCOUNT NO.:  [id number]  MEDICAL RECORD NO.:  [id number]           PATIENT TYPE:  O  LOCATION:  [facility]                          FACILITY:  [facility]  PHYSICIAN:  Erick Colace, M.D.DATE OF BIRTH:  Apr 06, 1951  DATE OF PROCEDURE:  04/05/2011 DATE OF DISCHARGE:                              OPERATIVE REPORT  REASON FOR VISIT:  Bilateral sacroiliac injection under fluoroscopic guidance.  INDICATION:  Sacroiliac pain.  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  Informed consent was obtained describing risks and benefits of the procedure with the patient.  These include bleeding, bruising, and infection.  She elects to proceed and has given written consent.  Her previous sacroiliac injection was performed on January 07, 2011.  The patient placed prone on fluoroscopy table.  Betadine prep, sterile drape, 25-gauge inch and half needle was used to anesthetize the skin and subcutaneous tissue 1% lidocaine x2 mL.  Then, 25-gauge 3 inch spinal needle was inserted, first in the left SI joint.  AP and lateral images utilized.  Omnipaque 180 on live fluoro demonstrated good joint space spread plus injection of 1 mL of 2% MPF lidocaine plus 0.5 mL of 40 mg/mL Depo-Medrol.  The same procedure was repeated on the right side using same needle, injectate, and technique.  The patient tolerated procedure well.  Postprocedure instructions given.  Because of problems with the liver function tests, she has been holding her hydrocodone.  She has had some increased pain related to that and therefore switched to oxycodone 5 mg.  We will see her back in 1 month for followup.     Erick Colace, M.D. Electronically Signed    AEK/MEDQ  D:  04/05/2011 12:31:16  T:  04/05/2011 12:56:01  Job:  [id number]

## 2011-04-12 ENCOUNTER — Ambulatory Visit: Payer: Medicare Other | Admitting: Physical Medicine & Rehabilitation

## 2011-04-30 ENCOUNTER — Ambulatory Visit: Payer: Medicare Other | Admitting: Physical Medicine & Rehabilitation

## 2011-05-11 ENCOUNTER — Encounter: Payer: Medicare Other | Attending: Physical Medicine & Rehabilitation

## 2011-05-11 ENCOUNTER — Ambulatory Visit: Payer: Medicare Other | Admitting: Physical Medicine & Rehabilitation

## 2011-05-11 DIAGNOSIS — R209 Unspecified disturbances of skin sensation: Secondary | ICD-10-CM

## 2011-05-11 DIAGNOSIS — M47812 Spondylosis without myelopathy or radiculopathy, cervical region: Secondary | ICD-10-CM | POA: Insufficient documentation

## 2011-05-11 DIAGNOSIS — M545 Low back pain, unspecified: Secondary | ICD-10-CM | POA: Insufficient documentation

## 2011-05-11 DIAGNOSIS — M961 Postlaminectomy syndrome, not elsewhere classified: Secondary | ICD-10-CM | POA: Insufficient documentation

## 2011-05-11 DIAGNOSIS — M25559 Pain in unspecified hip: Secondary | ICD-10-CM | POA: Insufficient documentation

## 2011-05-11 DIAGNOSIS — M533 Sacrococcygeal disorders, not elsewhere classified: Secondary | ICD-10-CM

## 2011-05-11 DIAGNOSIS — IMO0001 Reserved for inherently not codable concepts without codable children: Secondary | ICD-10-CM | POA: Insufficient documentation

## 2011-05-11 DIAGNOSIS — B830 Visceral larva migrans: Secondary | ICD-10-CM

## 2011-05-11 DIAGNOSIS — Z981 Arthrodesis status: Secondary | ICD-10-CM | POA: Insufficient documentation

## 2011-05-11 DIAGNOSIS — M79609 Pain in unspecified limb: Secondary | ICD-10-CM | POA: Insufficient documentation

## 2011-05-11 NOTE — Assessment & Plan Note (Signed)
REASON FOR VISIT:  Left leg numbness.  A 61 year old female with post polio syndrome, mainly left lower extremity weakness, has had sacroiliac problems which responded well to bilateral sacroiliac injection April 05, 2011.  She is getting some recurrence, but overall doing better in this regard.  She is, however, noticing some increasing numbness in the left lateral calf and left lateral knee area.  She has had no accidents or injury.  She has a history of some liver disease and has had her hydrocodone and pravastatin on hold. However, we have substituted oxycodone 5 mg t.i.d. She has had no other new medical problems in the interval time.  REVIEW OF SYSTEMS:  Otherwise, negative.  She does have chronic left lower extremity weakness and uses a KAFO to ambulate.  OBJECTIVE:  VITAL SIGNS:  Blood pressure 124/56, pulse 78, respiratory rate is 16 and O2 sat 97% on room air.  Height 5 feet 1 inches, weight 136 pounds. EXTREMITIES:  Left lower extremity, he has a KAFO.  She has decreased sensation to pinprick over the lateral aspect of the distal thigh as well as the knee area, but intact pinprick on the medial aspect.  Her gait remains stiff legged with KAFO in the locked position.  Her back has no tenderness to palpation.  Difficult to assess range of motion secondary to balance problems related to her weakness in left lower extremity.  IMPRESSION:  Left lower extremity numbness lateral leg and knee area extending into the distal thigh.  I suspect she may have an L4 or L5 radiculopathy.  This has been going on for several weeks.  We will check MRI scan.  Discussed with the patient, agrees with plan.  I will see her back in about 1 month post.     Erick Colace, M.D. Electronically Signed    AEK/MedQ D:  05/11/2011 17:00:12  T:  05/11/2011 23:18:37  Job #:  [id number]

## 2011-05-25 ENCOUNTER — Ambulatory Visit: Payer: Medicare Other | Admitting: Physical Medicine & Rehabilitation

## 2011-05-25 DIAGNOSIS — M25569 Pain in unspecified knee: Secondary | ICD-10-CM

## 2011-05-25 NOTE — Procedures (Signed)
NAMEZYAH, GOMM NO.:  [id number]  MEDICAL RECORD NO.:  [id number]           PATIENT TYPE:  O  LOCATION:  [facility]                          FACILITY:  [facility]  PHYSICIAN:  Erick Colace, M.D.DATE OF BIRTH:  1950/11/22  DATE OF PROCEDURE: DATE OF DISCHARGE:                              OPERATIVE REPORT  This is a limited ultrasound left knee.  INDICATION:  Left knee pain.  The patient placed in a prone position, 12 Hz transducer utilized. Popliteal fossa was scanned.  No evidence of popliteal cyst.  There was evidence of denervated muscle and a patchy distribution in the gastrocnemius.  The lateral collateral ligament was examined and was intact.  The lateral joint line of the knee joint was examined and there was some irregularity, but no large osteophytes.  IMPRESSION: 1. Abnormal study. 2. Evidence of chronically denervated atrophied muscle in the gastrocs     consistent with underlying diagnosis of polio. 3. No evidence of lateral collateral ligament disruption which     corresponds the patient's painful area.  RECOMMENDATION:  The patient proceed with MRI of the lumbar spine as her pain may represent some radicular component.     Erick Colace, M.D. Electronically Signed    AEK/MEDQ  D:  05/25/2011 15:01:57  T:  05/25/2011 21:21:09  Job:  [id number]

## 2011-05-27 ENCOUNTER — Ambulatory Visit: Payer: Self-pay | Admitting: Physical Medicine & Rehabilitation

## 2011-06-08 ENCOUNTER — Encounter: Payer: Medicare Other | Attending: Physical Medicine & Rehabilitation

## 2011-06-08 ENCOUNTER — Ambulatory Visit: Payer: Medicare Other | Admitting: Physical Medicine & Rehabilitation

## 2011-06-08 DIAGNOSIS — M545 Low back pain, unspecified: Secondary | ICD-10-CM | POA: Insufficient documentation

## 2011-06-08 DIAGNOSIS — Z981 Arthrodesis status: Secondary | ICD-10-CM | POA: Insufficient documentation

## 2011-06-08 DIAGNOSIS — M533 Sacrococcygeal disorders, not elsewhere classified: Secondary | ICD-10-CM

## 2011-06-08 DIAGNOSIS — R209 Unspecified disturbances of skin sensation: Secondary | ICD-10-CM | POA: Insufficient documentation

## 2011-06-08 DIAGNOSIS — M961 Postlaminectomy syndrome, not elsewhere classified: Secondary | ICD-10-CM | POA: Insufficient documentation

## 2011-06-08 DIAGNOSIS — M25559 Pain in unspecified hip: Secondary | ICD-10-CM | POA: Insufficient documentation

## 2011-06-08 DIAGNOSIS — M79609 Pain in unspecified limb: Secondary | ICD-10-CM | POA: Insufficient documentation

## 2011-06-08 DIAGNOSIS — M76899 Other specified enthesopathies of unspecified lower limb, excluding foot: Secondary | ICD-10-CM

## 2011-06-08 DIAGNOSIS — M47812 Spondylosis without myelopathy or radiculopathy, cervical region: Secondary | ICD-10-CM | POA: Insufficient documentation

## 2011-06-08 DIAGNOSIS — IMO0001 Reserved for inherently not codable concepts without codable children: Secondary | ICD-10-CM | POA: Insufficient documentation

## 2011-06-08 DIAGNOSIS — B91 Sequelae of poliomyelitis: Secondary | ICD-10-CM

## 2011-06-08 NOTE — Assessment & Plan Note (Signed)
REASON FOR VISIT:  Back pain, hip pain, left lower extremity pain.  Andrea [name] is a 61 year old female who has a history of post-polio syndrome affecting the left lower extremity, primarily she has had problems with sacroiliac disorder due to her asymmetric gait.  More recently, she has complained of pain going down along the lateral aspect of left lower extremity with the numbness or at least pain in the lower extremity. She already is very weak in the left lower extremity and requires a KAFO to ambulate.  Because of her complaints that had been going on for couple months, we checked MRI of the lower extremity.  This revealed mild degenerative disk L4-5 with some mild foraminal narrowing.  The remaining disk spaces were normal.  Her past medical history is significant for trochanteric bursitis.  Her surgeon once offered her a bursectomy.  I reviewed her MRI films personally and I concur with Radiology report, the disk at L4-5 is really minimal and it has normal disk height.  She has had no falls.  No bowel or bladder dysfunction.  Her neck pain is fairly well controlled.  Her average pain is 5/10.  This is worse at night when she is trying to sleep.  She has had inner current medical illness of bronchitis.   She still smokes 2-3 cigarettes per day.  PHYSICAL EXAMINATION:  VITAL SIGNS:  Her blood pressure is 102/64, pulse 76, respirations 18, O2 sat 92% on room air, weight 132 pounds, height 5 feet 1 inch. MUSCULOSKELETAL: Lower extremity strength, she has 3- at the quad, absent ankle dorsiflexion, plantar flexion on left side, but this is chronic due to her polio, she is a severely atrophic left lower extremity.  This includes the quad, hamstring, and anterior-posterior compartments of the leg.  Sensation is mildly reduced on the lateral aspect of the thigh compared to the medial aspect.  Deep tendon reflexes are absent.  Straight leg raising test is negative.  She has  tenderness over the left greater trochanter compared to the right side.  IMPRESSION:  Left lower extremity pain with trochanteric bursitis plus minus meralgia paresthetica.  The knee-ankle-foot orthosis hits her in the upper thigh, this could potentially cause an entrapment type of situation for the lateral femoral cutaneous nerve.  PLAN: 1. We will inject the hip today and then further evaluate. 2. Her sacroiliac injection is due in another month, we will schedule     for that. 3. Liver function tests have been reviewed, they have normalized and     she has gotten the green light to go back on hydrocodone by her     primary physician.  We will reduce her Tylenol dose from 500 mg to     325 mg she will take it 3 times a day.  Discussed with patient, agrees with plan.     Erick Colace, M.D. Electronically Signed    AEK/MedQ D:  06/08/2011 11:21:41  T:  06/08/2011 20:19:44  Job #:  [id number]

## 2011-06-08 NOTE — Procedures (Signed)
NAMEKORRINE, SICARD                 ACCOUNT NO.:  [id number]  MEDICAL RECORD NO.:  [id number]           PATIENT TYPE:  O  LOCATION:  [facility]                          FACILITY:  [facility]  PHYSICIAN:  Erick Colace, M.D.DATE OF BIRTH:  1950-05-05  DATE OF PROCEDURE: DATE OF DISCHARGE:                              OPERATIVE REPORT  PROCEDURE:  Left hip trochanteric bursa injection.  INDICATION:  Left hip pain only partially responsive to medication management including narcotic analgesics.  She complains of pain that interferes with sleep.  She is already on meloxicam.  Informed consent was obtained after describing risks and benefits of the procedure with the patient.  These include bleeding, bruising, and infection.  She elects to proceed and has given written consent.  The patient was placed in a right lateral decubitus position.  Area marked and prepped with Betadine, entered with 25-gauge inch and half needle. A 1 mL of 4 mg/mL Depo-Medrol and 4 mL of 1% lidocaine was injected. The patient tolerated the procedure well.  Postprocedure instructions given.     Erick Colace, M.D. Electronically Signed    AEK/MEDQ  D:  06/08/2011 11:29:02  T:  06/08/2011 20:37:49  Job:  [id number]

## 2011-06-15 ENCOUNTER — Ambulatory Visit: Payer: Self-pay | Admitting: General Surgery

## 2011-07-08 ENCOUNTER — Ambulatory Visit: Payer: Medicare Other | Admitting: Physical Medicine & Rehabilitation

## 2011-07-12 ENCOUNTER — Ambulatory Visit (HOSPITAL_BASED_OUTPATIENT_CLINIC_OR_DEPARTMENT_OTHER): Payer: Medicare Other | Admitting: Physical Medicine & Rehabilitation

## 2011-07-12 ENCOUNTER — Encounter: Payer: Medicare Other | Attending: Physical Medicine & Rehabilitation

## 2011-07-12 ENCOUNTER — Encounter: Payer: Self-pay | Admitting: Physical Medicine & Rehabilitation

## 2011-07-12 VITALS — BP 113/55 | HR 61 | Resp 16 | Ht 61.0 in | Wt 137.0 lb

## 2011-07-12 DIAGNOSIS — M961 Postlaminectomy syndrome, not elsewhere classified: Secondary | ICD-10-CM | POA: Insufficient documentation

## 2011-07-12 DIAGNOSIS — M79609 Pain in unspecified limb: Secondary | ICD-10-CM | POA: Insufficient documentation

## 2011-07-12 DIAGNOSIS — M47812 Spondylosis without myelopathy or radiculopathy, cervical region: Secondary | ICD-10-CM | POA: Insufficient documentation

## 2011-07-12 DIAGNOSIS — M533 Sacrococcygeal disorders, not elsewhere classified: Secondary | ICD-10-CM

## 2011-07-12 DIAGNOSIS — M545 Low back pain, unspecified: Secondary | ICD-10-CM | POA: Insufficient documentation

## 2011-07-12 DIAGNOSIS — Z981 Arthrodesis status: Secondary | ICD-10-CM | POA: Insufficient documentation

## 2011-07-12 DIAGNOSIS — M25559 Pain in unspecified hip: Secondary | ICD-10-CM | POA: Insufficient documentation

## 2011-07-12 DIAGNOSIS — R209 Unspecified disturbances of skin sensation: Secondary | ICD-10-CM | POA: Insufficient documentation

## 2011-07-12 NOTE — Patient Instructions (Signed)
Radiofrequency Lesioning Radiofrequency lesioning is a procedure to relieve pain. The procedure is often used for back, neck, or arm pain. Radiofrequency lesioning uses a specialized machine that creates radio waves to make heat. The heat damages the nerve that carries the pain signal. Pain relief usually lasts 6 months to 1 year.  LET YOUR CAREGIVER KNOW ABOUT:  Allergies to food or medicine.   Medicines taken, including vitamins, herbs, eyedrops, over-the-counter medicines, and creams.   Use of steroids (by mouth or creams).   Previous problems with anesthetics or numbing medicines.   History of bleeding problems or blood clots.   Previous surgery.   Other health problems, including diabetes and kidney problems.   Possibility of pregnancy, if this applies.   Breathing problems and smoking history.   Any recent colds or infections.  RISKS AND COMPLICATIONS This procedure is generally safe. The risks and complications depend on what treatment site is used. General complications may include:  Pain or soreness at the injection site.   Infection at the injection site.   Damage to nerves or blood vessels.  BEFORE THE PROCEDURE  Ask your caregiver about changing or stopping any medicines you are on before the procedure.   If you take blood thinners, ask if you should stop taking them before the procedure.   You may be asked to wash with an antibiotic soap before the procedure.   Do not eat or drink for 8 hours before your procedure or as told by your caregiver.   Ask your caregiver what time you need to arrive for your procedure.   This is an outpatient procedure. This means you will be able to go home the same day. Make plans for someone to drive you home.  PROCEDURE  You will be awake during the procedure. You need to be able to talk to the surgeon during the procedure. However, you might be given medicine to help you relax (sedative).   Medicine to numb the area (local  anesthetic) will be injected.   With the help of a type of X-ray (fluoroscopy), a radio frequency needle will be inserted into the area to be treated. Then, a wire that carries the radio waves (electrode) will be put through the radio frequency needle. An electrical pulse will be sent through the electrode to verify the correct nerve.   You will feel a tingling sensation similar to hitting your "funny bone." You may also have muscle twitching. The tissue around the needle tip is then heated when electric current is passed using the radio frequency machine. This numbs the nerves.   A bandage (dressing) will be put on the area after the procedure is done.  AFTER THE PROCEDURE  You will stay in a recovery area until you are awake enough to eat and drink.   Once everything is back to normal, you will be able to go home.   You will need to arrange for someone to drive you home if you received a sedative or pain relieving medicine during the procedure.  Document Released: 12/16/2010 Document Revised: 04/08/2011 Document Reviewed: 12/16/2010 [employer] Patient Information 2012 [employer]. 

## 2011-07-12 NOTE — Progress Notes (Signed)
  PROCEDURE RECORD [facility]   Name: Andrea [name] DOB:1950-11-21 MRN: [id number]  Date:07/12/2011  Physician: Claudette Laws, MD    Nurse/CMA: Adalaide Jaskolski RN  Allergies:  Allergies  Allergen Reactions  . Lisinopril     Consent Signed: yes  Is patient diabetic? no  CBG today?   Pregnant: no LMP: No LMP recorded. (age 62-55)> 49  Anticoagulants: no Anti-inflammatory: no Antibiotics: no  Procedure: Bilateral Sacroiliac Injection of steroid Position: Prone Start Time: 12:47 End Time: 12:53 Fluoro Time: 16 sec  RN/CMA Haematologist RN    Time 12:22 12:56    BP 113/55 124/59    Pulse 61 67    Respirations 16 16    O2 Sat 98% 97%    S/S 6 6    Pain Level 4/10 2/10     D/C home with husband Andrea [name], [name] & O X 3, D/C instructions reviewed, and sits independently.

## 2011-07-12 NOTE — Progress Notes (Signed)
Bilateral sacroiliac injections under fluoroscopic guidance  Indication: Low back and buttocks pain not relieved by medication management and other conservative care.  Informed consent was obtained after describing risks and benefits of the procedure with the patient, this includes bleeding, bruising, infection, paralysis and medication side effects. The patient wishes to proceed and has given written consent. The patient was placed in a prone position. The lumbar and sacral area was marked and prepped with Betadine. A 25-gauge 1-1/2 inch needle was inserted into the skin and subcutaneous tissue and 1 mL of 1% lidocaine was injected into each side. Then a 25-gauge 3 inch spinal needle was inserted under fluoroscopic guidance into the left sacroiliac joint. AP and lateral images were utilized. Omnipaque 180x0.5 mL under live fluoroscopy demonstrated no intravascular uptake. Then a solution containing one ML of 40 mg per mL Depakote met drawl in 2 ML of 2% lidocaine MPF was injected x1.5 mL. This same procedure was repeated on the right side using the same needle, injectate, and technique. Patient tolerated the procedure well. Post procedure instructions were given. Please see post procedure form. 

## 2011-07-12 NOTE — Progress Notes (Signed)
Addended by: Doreene Eland on: 07/12/2011 03:42 PM   Modules accepted: Orders

## 2011-08-02 ENCOUNTER — Other Ambulatory Visit: Payer: Self-pay | Admitting: Physical Medicine & Rehabilitation

## 2011-08-05 ENCOUNTER — Encounter: Payer: Self-pay | Admitting: Physical Medicine & Rehabilitation

## 2011-09-27 ENCOUNTER — Other Ambulatory Visit: Payer: Self-pay | Admitting: Physical Medicine & Rehabilitation

## 2011-10-11 ENCOUNTER — Ambulatory Visit: Payer: Medicare Other | Admitting: Physical Medicine & Rehabilitation

## 2011-10-14 ENCOUNTER — Ambulatory Visit: Payer: Medicare Other | Admitting: Physical Medicine & Rehabilitation

## 2011-10-24 ENCOUNTER — Other Ambulatory Visit: Payer: Self-pay | Admitting: Physical Medicine & Rehabilitation

## 2011-11-09 ENCOUNTER — Ambulatory Visit (HOSPITAL_BASED_OUTPATIENT_CLINIC_OR_DEPARTMENT_OTHER): Payer: Medicare Other | Admitting: Physical Medicine & Rehabilitation

## 2011-11-09 ENCOUNTER — Encounter: Payer: Medicare Other | Attending: Physical Medicine & Rehabilitation

## 2011-11-09 ENCOUNTER — Encounter: Payer: Self-pay | Admitting: Physical Medicine & Rehabilitation

## 2011-11-09 VITALS — BP 123/75 | HR 67 | Resp 18 | Wt 141.0 lb

## 2011-11-09 DIAGNOSIS — M533 Sacrococcygeal disorders, not elsewhere classified: Secondary | ICD-10-CM | POA: Diagnosis not present

## 2011-11-09 DIAGNOSIS — R209 Unspecified disturbances of skin sensation: Secondary | ICD-10-CM | POA: Insufficient documentation

## 2011-11-09 DIAGNOSIS — R29898 Other symptoms and signs involving the musculoskeletal system: Secondary | ICD-10-CM | POA: Diagnosis not present

## 2011-11-09 DIAGNOSIS — M6250 Muscle wasting and atrophy, not elsewhere classified, unspecified site: Secondary | ICD-10-CM | POA: Diagnosis not present

## 2011-11-09 DIAGNOSIS — B91 Sequelae of poliomyelitis: Secondary | ICD-10-CM | POA: Diagnosis present

## 2011-11-09 DIAGNOSIS — M7061 Trochanteric bursitis, right hip: Secondary | ICD-10-CM

## 2011-11-09 DIAGNOSIS — R269 Unspecified abnormalities of gait and mobility: Secondary | ICD-10-CM | POA: Diagnosis not present

## 2011-11-09 DIAGNOSIS — M25559 Pain in unspecified hip: Secondary | ICD-10-CM | POA: Insufficient documentation

## 2011-11-09 DIAGNOSIS — M76899 Other specified enthesopathies of unspecified lower limb, excluding foot: Secondary | ICD-10-CM | POA: Insufficient documentation

## 2011-11-09 MED ORDER — METHOCARBAMOL 500 MG PO TABS: 500.0000 mg | ORAL_TABLET | Freq: Three times a day (TID) | ORAL | Status: DC

## 2011-11-09 NOTE — Progress Notes (Signed)
A 61 year old female with post polio syndrome, mainly left lower  extremity weakness, has had sacroiliac problems which responded well to  bilateral sacroiliac injection April 05, 2011. She is getting some  recurrence, but overall doing better in this regard. She is, however,  noticing some increasing numbness in the left lateral calf and left  lateral knee area. She has had no accidents or injury. She has a  history of some liver disease and has had her hydrocodone and  pravastatin on hold. However, we have substituted oxycodone 5 mg t.i.d.  She has had no other new medical problems in the interval time.  REVIEW OF SYSTEMS: Otherwise, negative. She does have chronic left  lower extremity weakness and uses a KAFO to ambulate.  She has undergone right hip trochanteric bursa injection inventory of this year. She's had recurrence of lateral hip pain.  We also discussed her sacroiliac injections which have decreased her pain by at least 50% on 2 or more occasions. She's had injections done in March of 2013 and December of 2012.  Examination ambulates with a KAFO Left quad is 3 minus strength Severe left quad atrophy Right PSIS greater than left PSIS tenderness Bilateral hip trochanteric bursa tenderness.  Impression 1. Post polio syndrome with chronic left lower greater than left upper extremity weakness and gait disorder. 2. Trochanteric bursitis right greater than left hip will inject right hip today 3. 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 We'll schedule for radiofrequency procedure   Right hip trochanteric bursa injection  Indication right hip trochanteric bursitis pain causes sleep problems Pain is only partially response at medication management other conservative care has responded inventory to a bursa injection Informed consent was obtained after describing risks and benefits of the procedure with the patient his include bleeding bruising and infection she elects to  proceed and has given written consent. Patient placed in a left lateral decubitus position area over the right trochanteric bursa marked and prepped with Betadine alcohol and to a 25-gauge inch and a half needle. A solution containing 1 cc of 40 mg/ cc Depo-Medrol and 4 cc of 1% MPF lidocaine were injected patient tolerated procedure well post procedure instructions given. Indication

## 2011-11-09 NOTE — Progress Notes (Signed)
  PROCEDURE RECORD [facility]   Name: Andrea [name] DOB:[birth year] MRN: [id number]  Date:11/09/2011  Physician: Claudette Laws, MD    Nurse/CMA: Kelli Churn  Allergies:  Allergies  Allergen Reactions  . Lisinopril     Raises BP    Consent Signed: no  Is patient diabetic? no  CBG today?   Pregnant: no LMP: No LMP recorded. Patient has had a hysterectomy. (age 62-55)  Anticoagulants: no Anti-inflammatory: no Antibiotics: no  Procedure:  Procedure was not done today. Position: Prone       . Start Time:  End Time:  Fluoro Time:   RN/CMA      Time      BP      Pulse      Respirations      O2 Sat      S/S      Pain Level       D/C home with spouse, [name] & O X 3, D/C instructions reviewed, and sits independently.

## 2011-11-23 ENCOUNTER — Encounter: Payer: Self-pay | Admitting: Physical Medicine & Rehabilitation

## 2011-11-23 ENCOUNTER — Ambulatory Visit (HOSPITAL_BASED_OUTPATIENT_CLINIC_OR_DEPARTMENT_OTHER): Payer: Medicare Other | Admitting: Physical Medicine & Rehabilitation

## 2011-11-23 VITALS — BP 125/65 | HR 61 | Resp 14 | Ht 61.0 in | Wt 139.0 lb

## 2011-11-23 DIAGNOSIS — M533 Sacrococcygeal disorders, not elsewhere classified: Secondary | ICD-10-CM

## 2011-11-23 DIAGNOSIS — B91 Sequelae of poliomyelitis: Secondary | ICD-10-CM | POA: Diagnosis not present

## 2011-11-23 NOTE — Progress Notes (Signed)
  PROCEDURE RECORD [facility]   Name: KODEE DRURY DOB:14-May-1950 MRN: [id number]  Date:11/23/2011  Physician: Claudette Laws, MD    Nurse/CMA: Kerin Perna  Allergies:  Allergies  Allergen Reactions  . Lisinopril     Raises BP    Consent Signed: yes  Is patient diabetic? no   Pregnant: no LMP: No LMP recorded. Patient has had a hysterectomy. (age 62-55)  Anticoagulants: no Anti-inflammatory: yes (Meloxicam but has not taking in 4 days) Antibiotics: no  Procedure: Radiofrequency   Position: Prone Start Time:  12:50pm End Time:  1:33 pm Fluoro Time: 37  RN/CMA Levens, CMA Levens, CMA    Time 11:19am 1:38 pm    BP 125/65 126/68    Pulse 61 64    Respirations 14 14    O2 Sat 97 96    S/S 6 6    Pain Level 3/10 0/10     D/C home with Paul-husband, [name] & O X 3, D/C instructions reviewed, and sits independently.

## 2011-11-23 NOTE — Patient Instructions (Signed)
Radiofrequency Lesioning Care After Refer to this sheet in the next few weeks. These instructions provide you with information on caring for yourself after your procedure. Your caregiver may also give you more specific instructions. Your treatment has been planned according to current medical practices, but problems sometimes occur. Call your caregiver if you have any problems or questions after your procedure. HOME CARE INSTRUCTIONS  Take pain medicine as directed by your caregiver.   You may have pain from the burned nerve for a while after the procedure. This takes time to heal. Ask your caregiver how long you should expect pain.   You should be able to return to your normal activities a couple of days after the procedure. When you can go back to work will depend on the type of work you do. Discuss this with your caregiver.   Pay close attention to how you feel after the procedure. If you start having pain, write down when it hurts and how it feels. This will help you and your caregiver know if you need another treatment.  SEEK MEDICAL CARE IF:  The site where the needle was inserted for the procedure becomes red, swollen, or tender to touch.   Your pain does not get better.  SEEK IMMEDIATE MEDICAL CARE IF:  You develop sudden, severe pain.   You develop numbness or tingling near the procedure site.   You have a fever.  MAKE SURE YOU:  Understand these instructions.   Will watch your condition.   Will get help right away if you are not doing well or get worse.  Document Released: 12/16/2010 Document Revised: 04/08/2011 Document Reviewed: 12/16/2010 [employer] Patient Information 2012 [employer]. 

## 2011-11-23 NOTE — Progress Notes (Signed)
Right L4 medial branch, L5, S1, S2, S3 dorsal ramus radiofrequency neurotomyunder fluoroscopic guidance Indication sacroiliac pain that has been reduced by greater than 50% on 2 occasions by right sacroiliac injection Pain is only partially response to medication management interferes with self-care and mobility Informed consent was obtained after describing risks and benefits of the procedure with the patient these include bleeding bruising and infection she elects to proceed and has given written consent Patient was asked to do sterile drape 25-gauge needle was used to anesthetize the skin and subcutaneous tissue with cc 1% lidocaine to 5 spots then a 10 cm 20-gauge RF needle with a 10 mm curved active tip was inserted per starting the right S1 SA pes sacral ala junction on contact made over the lateral imaging sensory symmetric 50 Hz followed by motor stimulation 2 Hz confirmed proper location followed by injection of 1 cc of a solution  dexamethasone 4 cc 1% lidocaine followed by radio frequency between 80C for 90 seconds and the right L5 its AP transverse process junction target a bone contact made component lateral imaging sensory statement 50 Hz, motor symmetric 2 Hz confirmed proper needle location followed by injection 1 cc of the dexamethasone lidocaine solution and radiofrequency lesioning at 80C for 90 seconds then the lateral border of the right S1-S2 and S3 sacral foramina were targeted bone contact made sensory stand at 50 Hz confirmed proper needle location followed by injection of the dexamethasone lidocaine solution and radiofrequency  80 x90 seconds patient tolerated procedure well tolerated procedure well no immediate post procedura lcomplications were identified. Post procedure instructions were given

## 2011-11-29 ENCOUNTER — Other Ambulatory Visit: Payer: Self-pay | Admitting: Physical Medicine & Rehabilitation

## 2011-12-08 ENCOUNTER — Telehealth: Payer: Self-pay | Admitting: Physical Medicine & Rehabilitation

## 2011-12-08 NOTE — Telephone Encounter (Signed)
RF on 7/27.  Still having pain back there.  Please call.

## 2011-12-08 NOTE — Telephone Encounter (Signed)
Lm for pt to call office regarding pain.

## 2011-12-09 ENCOUNTER — Telehealth: Payer: Self-pay | Admitting: Physical Medicine & Rehabilitation

## 2011-12-09 NOTE — Telephone Encounter (Signed)
Left second message for Mrs Chow to return our call.

## 2011-12-09 NOTE — Telephone Encounter (Signed)
Andrea [name] wanted Dr Wynn Banker to know that after her RF on 11/23/11, it bothered her for 2-4 days and then got better for a few days after that but now it has really been worse. Some times her medications don't even work as well for it.  She has a followup appt 12/20/11.

## 2011-12-09 NOTE — Telephone Encounter (Signed)
Returning Sybil's call. 

## 2011-12-13 NOTE — Telephone Encounter (Signed)
I spoke with Andrea [name] and instructed her to take her gabapentin as directed by Dr Wynn Banker (600mg  tid).

## 2011-12-13 NOTE — Telephone Encounter (Signed)
Please instruct patient to increase gabapentin to 600 mg 3 times per day until I see her next

## 2011-12-20 ENCOUNTER — Encounter: Payer: Self-pay | Admitting: Physical Medicine & Rehabilitation

## 2011-12-20 ENCOUNTER — Ambulatory Visit (HOSPITAL_BASED_OUTPATIENT_CLINIC_OR_DEPARTMENT_OTHER): Payer: Medicare Other | Admitting: Physical Medicine & Rehabilitation

## 2011-12-20 ENCOUNTER — Encounter: Payer: Medicare Other | Attending: Physical Medicine & Rehabilitation

## 2011-12-20 VITALS — BP 133/61 | HR 71 | Resp 14 | Ht 61.0 in | Wt 140.0 lb

## 2011-12-20 DIAGNOSIS — M6250 Muscle wasting and atrophy, not elsewhere classified, unspecified site: Secondary | ICD-10-CM | POA: Insufficient documentation

## 2011-12-20 DIAGNOSIS — M76899 Other specified enthesopathies of unspecified lower limb, excluding foot: Secondary | ICD-10-CM | POA: Insufficient documentation

## 2011-12-20 DIAGNOSIS — M533 Sacrococcygeal disorders, not elsewhere classified: Secondary | ICD-10-CM

## 2011-12-20 DIAGNOSIS — R209 Unspecified disturbances of skin sensation: Secondary | ICD-10-CM | POA: Insufficient documentation

## 2011-12-20 DIAGNOSIS — M25559 Pain in unspecified hip: Secondary | ICD-10-CM | POA: Insufficient documentation

## 2011-12-20 DIAGNOSIS — R29898 Other symptoms and signs involving the musculoskeletal system: Secondary | ICD-10-CM | POA: Insufficient documentation

## 2011-12-20 DIAGNOSIS — B91 Sequelae of poliomyelitis: Secondary | ICD-10-CM | POA: Insufficient documentation

## 2011-12-20 DIAGNOSIS — R269 Unspecified abnormalities of gait and mobility: Secondary | ICD-10-CM | POA: Insufficient documentation

## 2011-12-20 DIAGNOSIS — M7062 Trochanteric bursitis, left hip: Secondary | ICD-10-CM

## 2011-12-20 MED ORDER — GABAPENTIN 300 MG PO CAPS: 600.0000 mg | ORAL_CAPSULE | Freq: Three times a day (TID) | ORAL | Status: DC

## 2011-12-20 MED ORDER — HYDROCODONE-ACETAMINOPHEN 5-325 MG PO TABS: 1.0000 | ORAL_TABLET | Freq: Three times a day (TID) | ORAL | Status: DC | PRN

## 2011-12-20 NOTE — Progress Notes (Signed)
Subjective:    Patient ID: Andrea [name], female    DOB: 04-19-1951, 61 y.o.   MRN: [id number]  HPI Some increased pain after right SI joint radiofrequency neurotomy. This has started to subside however. Patient has also had hip pain right greater than left however she just had a right hip injection about 1-1/2 months ago. Has not had a left hip injection for several months. Pain Inventory Average Pain 4 Pain Right Now 8 My pain is sharp, dull, aching and thobbing  In the last 24 hours, has pain interfered with the following? General activity 4 Relation with others 1 Enjoyment of life 2 What TIME of day is your pain at its worst? evening Sleep (in general) Fair  Pain is worse with: walking, sitting and standing Pain improves with: rest, heat/ice and medication Relief from Meds: 4  Mobility use a cane use a walker ability to climb steps?  yes do you drive?  yes use a wheelchair Do you have any goals in this area?  no  Function disabled: date disabled 5/08 I need assistance with the following:  household duties and shopping Do you have any goals in this area?  no  Neuro/Psych weakness trouble walking depression  Prior Studies Any changes since last visit?  no  Physicians involved in your care Any changes since last visit?  no   Family History  Problem Relation Age of Onset  . Cancer Mother   . Cancer Father    History   Social History  . Marital Status: Married    Spouse Name: N/A    Number of Children: N/A  . Years of Education: N/A   Social History Main Topics  . Smoking status: Current Everyday Smoker -- 0.5 packs/day for 45 years    Types: Cigarettes  . Smokeless tobacco: Never Used  . Alcohol Use: None  . Drug Use: None  . Sexually Active: None   Other Topics Concern  . None   Social History Narrative  . None   Past Surgical History  Procedure Date  . Abdominal hysterectomy   . Spine surgery   . Shoulder arthroscopy w/ rotator cuff  repair   . Carpal tunnel release    Past Medical History  Diagnosis Date  . Disorders of sacrum   . Cervical post-laminectomy syndrome   . Cubital tunnel syndrome   . Unspecified musculoskeletal disorders and symptoms referable to neck     cervical/trapezius  . Lateral epicondylitis  of elbow   . Disturbance of skin sensation   . Late effects of acute poliomyelitis   . Diabetes mellitus   . Cancer   . Hypertension    BP 133/61  Pulse 71  Resp 14  Ht 5\' 1"  (1.549 m)  Wt 140 lb (63.504 kg)  BMI 26.45 kg/m2  SpO2 94%     Review of Systems  Musculoskeletal: Positive for myalgias, back pain, arthralgias and gait problem.  Neurological: Positive for weakness.  Psychiatric/Behavioral: Positive for dysphoric mood.  All other systems reviewed and are negative.       Objective:   Physical Exam  Constitutional: She is oriented to person, place, and time. She appears well-developed and well-nourished.  Musculoskeletal:       Lumbar back: She exhibits decreased range of motion, tenderness and pain. She exhibits no swelling and no spasm.       Mild PSIS tenderness No tenderness over the back or the buttocks No erythema  Neurological: She is alert and oriented  to person, place, and time. She exhibits abnormal muscle tone. Coordination abnormal.       Post polio weakness left lower extremity using KAFO  Psychiatric: She has a normal mood and affect.          Assessment & Plan:  1. 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 Has responded to sacroiliac radiofrequency neurotomy. Post to see her soreness is subsiding 2. Bilateral trochanteric bursitis  We'll inject left side today. Repeat right side next month 3. Should her right lobe strandy pain not subside over the next couple weeks she should call us and I will reorder an MRI to see if she has any type of radicular process.

## 2011-12-20 NOTE — Patient Instructions (Addendum)
Go back down to 3 hydrocodone a day Continue the higher dose gabapentin 600 mg 3 times per day Do pool exercises every day See me in 2 month for right hip injection

## 2011-12-30 ENCOUNTER — Ambulatory Visit: Payer: Medicare Other | Admitting: Physical Medicine & Rehabilitation

## 2012-01-27 ENCOUNTER — Other Ambulatory Visit: Payer: Self-pay | Admitting: *Deleted

## 2012-01-27 MED ORDER — HYDROCODONE-ACETAMINOPHEN 5-325 MG PO TABS: 1.0000 | ORAL_TABLET | Freq: Three times a day (TID) | ORAL | Status: DC | PRN

## 2012-02-14 ENCOUNTER — Encounter: Payer: Medicare Other | Attending: Physical Medicine & Rehabilitation

## 2012-02-14 ENCOUNTER — Encounter: Payer: Self-pay | Admitting: Physical Medicine & Rehabilitation

## 2012-02-14 ENCOUNTER — Ambulatory Visit (HOSPITAL_BASED_OUTPATIENT_CLINIC_OR_DEPARTMENT_OTHER): Payer: Medicare Other | Admitting: Physical Medicine & Rehabilitation

## 2012-02-14 VITALS — BP 128/62 | HR 61 | Resp 14 | Ht 61.0 in | Wt 135.0 lb

## 2012-02-14 DIAGNOSIS — M6250 Muscle wasting and atrophy, not elsewhere classified, unspecified site: Secondary | ICD-10-CM | POA: Insufficient documentation

## 2012-02-14 DIAGNOSIS — B91 Sequelae of poliomyelitis: Secondary | ICD-10-CM | POA: Insufficient documentation

## 2012-02-14 DIAGNOSIS — M7061 Trochanteric bursitis, right hip: Secondary | ICD-10-CM

## 2012-02-14 DIAGNOSIS — M76899 Other specified enthesopathies of unspecified lower limb, excluding foot: Secondary | ICD-10-CM

## 2012-02-14 DIAGNOSIS — M549 Dorsalgia, unspecified: Secondary | ICD-10-CM

## 2012-02-14 DIAGNOSIS — M533 Sacrococcygeal disorders, not elsewhere classified: Secondary | ICD-10-CM | POA: Insufficient documentation

## 2012-02-14 DIAGNOSIS — M25559 Pain in unspecified hip: Secondary | ICD-10-CM | POA: Insufficient documentation

## 2012-02-14 DIAGNOSIS — R29898 Other symptoms and signs involving the musculoskeletal system: Secondary | ICD-10-CM | POA: Insufficient documentation

## 2012-02-14 DIAGNOSIS — R209 Unspecified disturbances of skin sensation: Secondary | ICD-10-CM | POA: Insufficient documentation

## 2012-02-14 DIAGNOSIS — R269 Unspecified abnormalities of gait and mobility: Secondary | ICD-10-CM | POA: Insufficient documentation

## 2012-02-14 MED ORDER — GABAPENTIN 300 MG PO CAPS: 300.0000 mg | ORAL_CAPSULE | Freq: Three times a day (TID) | ORAL | Status: DC

## 2012-02-14 NOTE — Progress Notes (Signed)
Subjective:    Patient ID: Andrea [name], female    DOB: [birth year], 61 y.o.   MRN: [id number]  HPI Right side of back is hurting Left hip feels better after trochanteric bursa injection. Has right hip pain Pain Inventory Average Pain 3 Pain Right Now 3 My pain is intermittent, sharp, dull and aching  In the last 24 hours, has pain interfered with the following? General activity 5 Relation with others 2 Enjoyment of life 2 What TIME of day is your pain at its worst? evening Sleep (in general) Fair  Pain is worse with: walking and bending Pain improves with: heat/ice and medication Relief from Meds: 4  Mobility walk with assistance use a cane how many minutes can you walk? 5 ability to climb steps?  no do you drive?  yes  Function disabled: date disabled   Neuro/Psych weakness trouble walking depression  Prior Studies Any changes since last visit?  no  Physicians involved in your care Any changes since last visit?  no   Family History  Problem Relation Age of Onset  . Cancer Mother   . Cancer Father    History   Social History  . Marital Status: Married    Spouse Name: N/A    Number of Children: N/A  . Years of Education: N/A   Social History Main Topics  . Smoking status: Current Every Day Smoker -- 0.5 packs/day for 45 years    Types: Cigarettes  . Smokeless tobacco: Never Used  . Alcohol Use: None  . Drug Use: None  . Sexually Active: None   Other Topics Concern  . None   Social History Narrative  . None   Past Surgical History  Procedure Date  . Abdominal hysterectomy   . Spine surgery   . Shoulder arthroscopy w/ rotator cuff repair   . Carpal tunnel release    Past Medical History  Diagnosis Date  . Disorders of sacrum   . Cervical post-laminectomy syndrome   . Cubital tunnel syndrome   . Unspecified musculoskeletal disorders and symptoms referable to neck     cervical/trapezius  . Lateral epicondylitis  of elbow   .  Disturbance of skin sensation   . Late effects of acute poliomyelitis   . Diabetes mellitus   . Cancer   . Hypertension    BP 128/62  Pulse 61  Resp 14  Ht 5\' 1"  (1.549 m)  Wt 135 lb (61.236 kg)  BMI 25.51 kg/m2  SpO2 97%   Review of Systems  Musculoskeletal: Positive for gait problem.       Hip pain  Neurological: Positive for weakness.  Psychiatric/Behavioral: Positive for dysphoric mood.  All other systems reviewed and are negative.       Objective:   Physical Exam  Constitutional: She is oriented to person, place, and time.  Musculoskeletal:       Right hip: She exhibits tenderness and bony tenderness. She exhibits normal range of motion and normal strength.       Lumbar back: She exhibits decreased range of motion, tenderness and pain. She exhibits no deformity and no spasm.       Tenderness to palpation over right greater trochanter Tenderness palpation at the right L5 paraspinal muscles  Neurological: She is alert and oriented to person, place, and time. She displays atrophy. She displays no tremor. No sensory deficit. Gait abnormal.  Reflex Scores:      Patellar reflexes are 2+ on the right side and 0 on  the left side.      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 Has responded Partially to sacroiliac radiofrequency neurotomy.  2. Bilateral trochanteric bursitis We'll inject right  side today. Spotted well to left-sided injection last month.  3. L5 paraspinal back pain. She may have lumbar spondylosis as a pain generator at the L5-S1 level. Will perform medial branch blocks L3-L4 and L5 dorsal ramus injection  Right greater trochanter injection Indication right greater trochanteric bursitis Left-sided pain has responded to  injection last month. Her pain is only partially responsive to medication management and other conservative care and interferes with sleep In from consent was obtained after having risks and benefits of the procedure the patient these include bleeding bruising and infection she elects to proceed and has given written consent patient placed in a left lateral decubitus position area marked and prepped with Betadine alcohol and with 25-gauge inch and a half needle. After negative draw back for blood 1 cc of 40 November cc Depo-Medrol and 4 cc 1% lidocaine were injected. Patient tolerated procedure well. Post procedure instructions given

## 2012-02-14 NOTE — Patient Instructions (Signed)
Next visit will be for L3-L4-L5 medial branch blocks under fluoroscopic guidance. You will need a driver

## 2012-02-28 ENCOUNTER — Other Ambulatory Visit: Payer: Self-pay

## 2012-02-28 ENCOUNTER — Telehealth: Payer: Self-pay

## 2012-02-28 MED ORDER — HYDROCODONE-ACETAMINOPHEN 5-325 MG PO TABS: 1.0000 | ORAL_TABLET | Freq: Three times a day (TID) | ORAL | Status: DC | PRN

## 2012-02-28 NOTE — Telephone Encounter (Signed)
Lm advising patient to discuss options at next appt.

## 2012-02-28 NOTE — Telephone Encounter (Signed)
Patient called to see if she can have bilateral injections when she comes in instead of just one side.

## 2012-03-16 ENCOUNTER — Ambulatory Visit: Payer: Medicare Other | Admitting: Physical Medicine & Rehabilitation

## 2012-03-21 ENCOUNTER — Ambulatory Visit (HOSPITAL_BASED_OUTPATIENT_CLINIC_OR_DEPARTMENT_OTHER): Payer: Medicare Other | Admitting: Physical Medicine & Rehabilitation

## 2012-03-21 ENCOUNTER — Encounter: Payer: Self-pay | Admitting: Physical Medicine & Rehabilitation

## 2012-03-21 ENCOUNTER — Encounter: Payer: Medicare Other | Attending: Physical Medicine & Rehabilitation

## 2012-03-21 VITALS — BP 123/75 | HR 69 | Resp 14 | Wt 131.4 lb

## 2012-03-21 DIAGNOSIS — M533 Sacrococcygeal disorders, not elsewhere classified: Secondary | ICD-10-CM | POA: Insufficient documentation

## 2012-03-21 DIAGNOSIS — B91 Sequelae of poliomyelitis: Secondary | ICD-10-CM | POA: Insufficient documentation

## 2012-03-21 DIAGNOSIS — M47816 Spondylosis without myelopathy or radiculopathy, lumbar region: Secondary | ICD-10-CM

## 2012-03-21 DIAGNOSIS — R209 Unspecified disturbances of skin sensation: Secondary | ICD-10-CM | POA: Insufficient documentation

## 2012-03-21 DIAGNOSIS — R269 Unspecified abnormalities of gait and mobility: Secondary | ICD-10-CM | POA: Insufficient documentation

## 2012-03-21 DIAGNOSIS — M25559 Pain in unspecified hip: Secondary | ICD-10-CM | POA: Insufficient documentation

## 2012-03-21 DIAGNOSIS — M76899 Other specified enthesopathies of unspecified lower limb, excluding foot: Secondary | ICD-10-CM | POA: Insufficient documentation

## 2012-03-21 DIAGNOSIS — R29898 Other symptoms and signs involving the musculoskeletal system: Secondary | ICD-10-CM | POA: Insufficient documentation

## 2012-03-21 DIAGNOSIS — M6250 Muscle wasting and atrophy, not elsewhere classified, unspecified site: Secondary | ICD-10-CM | POA: Insufficient documentation

## 2012-03-21 DIAGNOSIS — M47817 Spondylosis without myelopathy or radiculopathy, lumbosacral region: Secondary | ICD-10-CM

## 2012-03-21 NOTE — Progress Notes (Signed)
  PROCEDURE RECORD [facility]   Name: Andrea [name] DOB:Feb 23, 1951 MRN: [id number]  Date:03/21/2012  Physician: Claudette Laws, MD    Nurse/CMA: Judeth Cornfield CMA  Allergies:  Allergies  Allergen Reactions  . Lisinopril     Raises BP    Consent Signed: yes  Is patient diabetic? no  CBG today?   Pregnant: no LMP: No LMP recorded. Patient has had a hysterectomy. (age [age])  Anticoagulants: no Anti-inflammatory: no Antibiotics: no  Procedure:  Medial Branch Block Bilateral Position: Supine Start Time:12:38  End Time: 12:48 Fluoro Time: 31  RN/CMA Shanekqua Schaper CMA Harden Bramer CMA    Time 12:27 12:53    BP 123/75  134/64    Pulse 69 64    Respirations 14 16    O2 Sat 98 98    S/S 6 6    Pain Level 4 5     D/C home with husband, [name] & O X 3, D/C instructions reviewed, and sits independently.

## 2012-03-21 NOTE — Progress Notes (Signed)
Bilateral Lumbar L3, L4  medial branch blocks and L 5 dorsal ramus injection under fluoroscopic guidance  Indication: Lumbar pain which is not relieved by medication management or other conservative care and interfering with self-care and mobility.  Informed consent was obtained after describing risks and benefits of the procedure with the patient, this includes bleeding, infection, paralysis and medication side effects.  The patient wishes to proceed and has given written consent.  The patient was placed in prone position.  The lumbar area was marked and prepped with Betadine.  One mL of 1% lidocaine was injected into each of 6 areas into the skin and subcutaneous tissue.  Then a 22-gauge 3.5 in spinal needle was inserted targeting the junction of the left S1 superior articular process and sacral ala junction. Needle was advanced under fluoroscopic guidance.  Bone contact was made.  Omnipaque 180 was injected x 0.5 mL demonstrating no intravascular uptake.  Then a solution containing one mL of 4 mg per mL dexamethasone and 3 mL of 2% MPF lidocaine was injected x 0.5 mL.  Then the left L5 superior articular process in transverse process junction was targeted.  Bone contact was made.  Omnipaque 180 was injected x 0.5 mL demonstrating no intravascular uptake. Then a solution containing one mL of 4 mg per mL dexamethasone and 3 mL of 2% MPF lidocaine was injected x 0.5 mL.  Then the left L4 superior articular process in transverse process junction was targeted.  Bone contact was made.  Omnipaque 180 was injected x 0.5 mL demonstrating no intravascular uptake.  Then a solution containing one mL of 4 mg per mL dexamethasone and 3 mL if 2% MPF lidocaine was injected x 0.5 mL.  This same procedure was performed on the right side using the same needle, technique and injectate.  Patient tolerated procedure well.  Post procedure instructions were given. 

## 2012-03-21 NOTE — Patient Instructions (Signed)
Facet Block A facet block is an injection procedure used to numb nerves near a spinal joint (facet). The injection usually includes a medicine like Novacaine (anesthetic) and a steroid medicine (similar to cortisone). The injections are made directly into the facet joint of the back. They are used for patients with several types of neck or back pain problems (such as worsening arthritis or persistent pain after surgery) that have not been helped with anti-inflammatory medications, exercise programs, physical therapy, and other forms of pain management. Multiple injections may be needed depending on how many joints are involved.  A facet block procedure can be helpful with diagnosis as well as providing therapeutic pain relief. One of three things may happen after the procedure:  The pain does not go away. This can mean that the pain is probably not coming from blocked facet joints. This information is helpful with diagnosis.  The pain goes away and stays away for a few hours but the original pain comes back and does not get better again. This information is also helpful with diagnosis. It can mean that pain is probably coming from the joints; but the steroid was not helpful for longer term pain control.  The pain goes away after the block, then returns later that day, and then gets better again over the next few days. This can mean that the block was helpful for pain control and the steroid had a longer lasting effect. If there is good, lasting benefit from the injections, the block may be repeated from 3 to 5 times. If there is good relief but it is only of short-term benefit, other procedures (such as radiofrequency lesioning) may be considered.  Note: The procedure cannot be performed if you have an active infection, a lesion on or near the area of injection, flu, cold, fever, very high blood pressure or if you are on blood thinners. Please make your doctor aware of any of these conditions. This is for  your safety!  LET YOUR CAREGIVER KNOW ABOUT:   Allergies.  Medications taken including herbs, eye drops, over the counter medications, and creams  Use of steroids (by mouth or creams).  Possible pregnancy, if applicable.  Previous problems with anesthetics or Novocaine.  History of blood clots.  History of bleeding or blood problems.  Previous surgery, particularly of the neck and/or back  Other health problems. RISKS AND COMPLICATIONS These are very uncommon but include:  Bleeding.  Injury to a nerve near the injection site.  Weakness or numbness in areas controlled by nerves near the injection site.  Infection.  Pain at the site of the injection.  Temporary fluid retention in those who are prone to this problem.  Allergic reaction to anesthetics or medicines used during the procedure. Diabetics may have a temporary increase in their blood sugar after any surgical procedure, especially if steroids are used. Stinging/burning of the numbing medicine is the most uncomfortable part of the procedure; however every person's response to any procedure is individual.  BEFORE THE PROCEDURE   Your caregiver will provide instructions about stopping any medication before the procedure.  Unless advised otherwise, if the injections are in your neck, you may take your medications as usual with a sip of water but do not eat or drink for 6 hours before the procedure.  Unless advised otherwise, you may eat, drink and take your medications as usual on the day of the procedure (both before and after) if the injections are to be in your lower back.    There is no other specific preparation necessary unless advised otherwise. PROCEDURE After checking your blood pressure, the procedure will be done in the x-ray (fluoroscopy) room while lying on your stomach. For procedures in the neck, an intravenous line is usually started. The back is then cleansed with an antiseptic soap. Sterile drapes are  placed in this area. The skin is numbed with a local anesthetic. This is felt as a stinging or burning sensation. Using x-ray guidance, needles are then advanced to the appropriate locations. Once the needles are in the proper location, the anesthetic and steroid is injected through the needles and the needles are removed. The skin is then cleansed and bandages are applied. Blood pressure will be checked again, and you will be discharged to leave with your ride after your caregiver says it is okay to go.  AFTER THE PROCEDURE  You may not drive for the remainder of the day after your procedure. An adult must be present to drive you home or to go with you in a taxi or on public transportation. The procedure will be canceled if you do not have a responsible adult with you! This is for your safety.  HOME CARE INSTRUCTIONS   The bandages noted above can be removed on the morning after the procedure.  Resume medications according to your caregiver's instructions.  No heat is to be used near or over the injected area(s) for the remainder of the day.  No tub bath or soaking in water (such as a pool, jacuzzi, etc.) for the remainder of the day.  Some local tenderness may be experienced for a couple of days after the injection. Using an ice pack three or four times a day will help this.  Keep track of the amount of pain relief as well as how long the pain relief lasted. SEEK MEDICAL CARE IF:   There is drainage from the injection site.  Pain is not controlled with medications prescribed.  There is significant bleeding or swelling. SEEK IMMEDIATE MEDICAL CARE IF:   You develop a fever of 101 F (38.3 C) or greater.  Worsening pain, swelling, and/or red streaking develops in the skin around the injection site.  Severe pain develops and cannot be controlled with medications prescribed.  You develop any headache, stiff neck, nausea, vomiting, or your eyes become very sensitive to light.  Weakness  or paralysis develops in arms or legs not present before the procedure.  You develop difficulty urinating or difficulty breathing. Document Released: 09/08/2006 Document Revised: 07/12/2011 Document Reviewed: 08/29/2008 [employer] Patient Information 2013 [employer].  

## 2012-03-23 ENCOUNTER — Other Ambulatory Visit: Payer: Self-pay | Admitting: Physical Medicine & Rehabilitation

## 2012-03-27 ENCOUNTER — Other Ambulatory Visit: Payer: Self-pay | Admitting: *Deleted

## 2012-03-27 MED ORDER — HYDROCODONE-ACETAMINOPHEN 5-325 MG PO TABS: 1.0000 | ORAL_TABLET | Freq: Three times a day (TID) | ORAL | Status: DC | PRN

## 2012-04-11 ENCOUNTER — Ambulatory Visit (HOSPITAL_BASED_OUTPATIENT_CLINIC_OR_DEPARTMENT_OTHER): Payer: Medicare Other | Admitting: Physical Medicine & Rehabilitation

## 2012-04-11 ENCOUNTER — Encounter: Payer: Self-pay | Admitting: Physical Medicine & Rehabilitation

## 2012-04-11 ENCOUNTER — Encounter: Payer: Medicare Other | Attending: Physical Medicine & Rehabilitation

## 2012-04-11 VITALS — BP 127/62 | HR 84 | Resp 14 | Ht 61.0 in | Wt 132.4 lb

## 2012-04-11 DIAGNOSIS — M6250 Muscle wasting and atrophy, not elsewhere classified, unspecified site: Secondary | ICD-10-CM | POA: Insufficient documentation

## 2012-04-11 DIAGNOSIS — M533 Sacrococcygeal disorders, not elsewhere classified: Secondary | ICD-10-CM | POA: Insufficient documentation

## 2012-04-11 DIAGNOSIS — G14 Postpolio syndrome: Secondary | ICD-10-CM

## 2012-04-11 DIAGNOSIS — M7061 Trochanteric bursitis, right hip: Secondary | ICD-10-CM | POA: Insufficient documentation

## 2012-04-11 DIAGNOSIS — M25559 Pain in unspecified hip: Secondary | ICD-10-CM | POA: Insufficient documentation

## 2012-04-11 DIAGNOSIS — R269 Unspecified abnormalities of gait and mobility: Secondary | ICD-10-CM | POA: Insufficient documentation

## 2012-04-11 DIAGNOSIS — R29898 Other symptoms and signs involving the musculoskeletal system: Secondary | ICD-10-CM | POA: Insufficient documentation

## 2012-04-11 DIAGNOSIS — B91 Sequelae of poliomyelitis: Secondary | ICD-10-CM | POA: Insufficient documentation

## 2012-04-11 DIAGNOSIS — M76899 Other specified enthesopathies of unspecified lower limb, excluding foot: Secondary | ICD-10-CM | POA: Insufficient documentation

## 2012-04-11 DIAGNOSIS — R209 Unspecified disturbances of skin sensation: Secondary | ICD-10-CM | POA: Insufficient documentation

## 2012-04-11 NOTE — Patient Instructions (Signed)
I suspect your right leg pain is related to postpolio syndrome, you may need a repeat EMG for this

## 2012-04-11 NOTE — Progress Notes (Signed)
Subjective:    Patient ID: Andrea [name], female    DOB: 04/18/1951, 61 y.o.   MRN: [id number]  HPI Status post radiofrequency to 11/23/2011 sacroiliac right Increasing right hip pain, last stroke bursa injection 02/14/2012 Also complains of right leg pain anterior which seems to radiate down from the thigh Reviewed MRI from January 2013 no compressive lesions noted History of polio which caused paralysis of all 4 limbs but the left lower extremity remained the weakest  Pain Inventory Average Pain 5 Pain Right Now 4 My pain is sharp, burning, stabbing, tingling and aching  In the last 24 hours, has pain interfered with the following? General activity 4 Relation with others 3 Enjoyment of life 3 What TIME of day is your pain at its worst? night Sleep (in general) Poor  Pain is worse with: walking, bending, sitting and standing Pain improves with: rest, heat/ice and medication Relief from Meds: 4  Mobility walk with assistance use a cane how many minutes can you walk? 5 ability to climb steps?  yes do you drive?  yes use a wheelchair Do you have any goals in this area?  no  Function disabled: date disabled 09/2007 I need assistance with the following:  household duties and shopping Do you have any goals in this area?  no  Neuro/Psych weakness trouble walking  Prior Studies Any changes since last visit?  no  Physicians involved in your care Any changes since last visit?  no   Family History  Problem Relation Age of Onset  . Cancer Mother   . Cancer Father    History   Social History  . Marital Status: Married    Spouse Name: N/A    Number of Children: N/A  . Years of Education: N/A   Social History Main Topics  . Smoking status: Current Every Day Smoker -- 0.5 packs/day for 45 years    Types: Cigarettes  . Smokeless tobacco: Never Used  . Alcohol Use: None  . Drug Use: None  . Sexually Active: None   Other Topics Concern  . None   Social History  Narrative  . None   Past Surgical History  Procedure Date  . Abdominal hysterectomy   . Spine surgery   . Shoulder arthroscopy w/ rotator cuff repair   . Carpal tunnel release    Past Medical History  Diagnosis Date  . Disorders of sacrum   . Cervical post-laminectomy syndrome   . Cubital tunnel syndrome   . Unspecified musculoskeletal disorders and symptoms referable to neck     cervical/trapezius  . Lateral epicondylitis  of elbow   . Disturbance of skin sensation   . Late effects of acute poliomyelitis   . Diabetes mellitus   . Cancer   . Hypertension    BP 127/62  Pulse 84  Resp 14  Ht 5\' 1"  (1.549 m)  Wt 132 lb 6.4 oz (60.056 kg)  BMI 25.02 kg/m2  SpO2 95%    Review of Systems  Musculoskeletal: Positive for back pain and gait problem.  Neurological: Positive for weakness.  All other systems reviewed and are negative.       Objective:   Physical Exam  Motor strength 1/5 in the left knee extensor hip flexor 4/5 strength in the right hip flexor knee extensor ankle dose flexor plantar flexor 4/5 in the left deltoid bicep tricep grip and 5/5 on the right side Sensation intact to touch Deep tendon reflexes 0/5 in bilateral knees and ankles Ambulation  is with a left KAFO, locked position Mood and affect are appropriate General no acute distress       Assessment & Plan:  1. Sacroiliac joint dysfunction RF starting to wear off,May repeat in February 2. Right trochanteric bursitis, repeat injection Continue current pain medications Right greater trochanter injection Indication right greater trochanteric bursitis Left-sided pain has responded to injection last month. Her pain is only partially responsive to medication management and other conservative care and interferes with sleep In from consent was obtained after having risks and benefits of the procedure the patient these include bleeding bruising and infection she elects to proceed and has given written  consent patient placed in a left lateral decubitus position area marked and prepped with Betadine alcohol and with 25-gauge inch and a half needle. After negative draw back for blood 1 cc of 40 November cc Depo-Medrol and 4 cc 1% lidocaine were injected. Patient tolerated procedure well. Post procedure instructions given 3. Post polio syndrome is likely cause of right lower tremor the pain in the anterior leg may need EMG

## 2012-04-12 NOTE — Telephone Encounter (Signed)
Dr. Wynn Banker, patient would like to have EMG here instead of going to [place].  Please advise.

## 2012-04-24 ENCOUNTER — Other Ambulatory Visit: Payer: Self-pay | Admitting: *Deleted

## 2012-04-24 MED ORDER — HYDROCODONE-ACETAMINOPHEN 5-325 MG PO TABS: 1.0000 | ORAL_TABLET | Freq: Three times a day (TID) | ORAL | Status: DC | PRN

## 2012-05-03 HISTORY — PX: SHOULDER SURGERY: SHX246

## 2012-05-12 ENCOUNTER — Encounter: Payer: Medicare Other | Attending: Physical Medicine & Rehabilitation

## 2012-05-12 ENCOUNTER — Ambulatory Visit (HOSPITAL_BASED_OUTPATIENT_CLINIC_OR_DEPARTMENT_OTHER): Payer: Medicare Other | Admitting: Physical Medicine & Rehabilitation

## 2012-05-12 ENCOUNTER — Encounter: Payer: Self-pay | Admitting: Physical Medicine & Rehabilitation

## 2012-05-12 VITALS — BP 133/77 | HR 70 | Resp 14 | Ht 61.0 in | Wt 129.2 lb

## 2012-05-12 DIAGNOSIS — M76899 Other specified enthesopathies of unspecified lower limb, excluding foot: Secondary | ICD-10-CM | POA: Insufficient documentation

## 2012-05-12 DIAGNOSIS — M533 Sacrococcygeal disorders, not elsewhere classified: Secondary | ICD-10-CM | POA: Insufficient documentation

## 2012-05-12 DIAGNOSIS — M6250 Muscle wasting and atrophy, not elsewhere classified, unspecified site: Secondary | ICD-10-CM | POA: Insufficient documentation

## 2012-05-12 DIAGNOSIS — R209 Unspecified disturbances of skin sensation: Secondary | ICD-10-CM | POA: Insufficient documentation

## 2012-05-12 DIAGNOSIS — M532X8 Spinal instabilities, sacral and sacrococcygeal region: Secondary | ICD-10-CM

## 2012-05-12 DIAGNOSIS — M25559 Pain in unspecified hip: Secondary | ICD-10-CM | POA: Insufficient documentation

## 2012-05-12 DIAGNOSIS — R29898 Other symptoms and signs involving the musculoskeletal system: Secondary | ICD-10-CM | POA: Insufficient documentation

## 2012-05-12 DIAGNOSIS — R269 Unspecified abnormalities of gait and mobility: Secondary | ICD-10-CM | POA: Insufficient documentation

## 2012-05-12 DIAGNOSIS — B91 Sequelae of poliomyelitis: Secondary | ICD-10-CM | POA: Insufficient documentation

## 2012-05-12 MED ORDER — HYDROCODONE-ACETAMINOPHEN 5-325 MG PO TABS: 1.0000 | ORAL_TABLET | Freq: Four times a day (QID) | ORAL | Status: DC | PRN

## 2012-05-12 NOTE — Patient Instructions (Signed)
Have increased the number of tablets per month on the hydrocodone prescription do not exceed 4 tablets per day Referral to orthopedic spine surgery to evaluate for right sacroiliac fusion Dr Jeanette Caprice

## 2012-05-12 NOTE — Progress Notes (Signed)
Subjective:    Patient ID: Andrea [name], female    DOB: 1951-03-31, 62 y.o.   MRN: [id number]  HPI Low back and right groin pain Left buttock pain and left posterior thigh pain when getting up or bending over Has had for 5 months relief from sacroiliac radiofrequency procedure. We reviewed MRI from 2013 January which looked essentially normal except for some mild degeneration at L4-5 Patient also has right groin pain. Pain Inventory Average Pain 6 Pain Right Now 6 My pain is unsure  In the last 24 hours, has pain interfered with the following? General activity 5 Relation with others 4 Enjoyment of life 5 What TIME of day is your pain at its worst? night Sleep (in general) Poor  Pain is worse with: walking, bending, sitting and standing Pain improves with: heat/ice and medication Relief from Meds: 5  Mobility use a cane how many minutes can you walk? 5 ability to climb steps?  yes do you drive?  yes use a wheelchair  Function disabled: date disabled  I need assistance with the following:  household duties and shopping Do you have any goals in this area?  no  Neuro/Psych weakness trouble walking depression  Prior Studies Any changes since last visit?  no  Physicians involved in your care Any changes since last visit?  no   Family History  Problem Relation Age of Onset  . Cancer Mother   . Cancer Father    History   Social History  . Marital Status: Married    Spouse Name: N/A    Number of Children: N/A  . Years of Education: N/A   Social History Main Topics  . Smoking status: Current Every Day Smoker -- 0.5 packs/day for 45 years    Types: Cigarettes  . Smokeless tobacco: Never Used  . Alcohol Use: None  . Drug Use: None  . Sexually Active: None   Other Topics Concern  . None   Social History Narrative  . None   Past Surgical History  Procedure Date  . Abdominal hysterectomy   . Spine surgery   . Shoulder arthroscopy w/ rotator cuff  repair   . Carpal tunnel release    Past Medical History  Diagnosis Date  . Disorders of sacrum   . Cervical post-laminectomy syndrome   . Cubital tunnel syndrome   . Unspecified musculoskeletal disorders and symptoms referable to neck     cervical/trapezius  . Lateral epicondylitis  of elbow   . Disturbance of skin sensation   . Late effects of acute poliomyelitis   . Diabetes mellitus   . Cancer   . Hypertension    BP 133/77  Pulse 70  Resp 14  Ht 5\' 1"  (1.549 m)  Wt 129 lb 3.2 oz (58.605 kg)  BMI 24.41 kg/m2  SpO2 95%    Review of Systems  Musculoskeletal: Positive for myalgias, arthralgias and gait problem.  Neurological: Positive for weakness.  Psychiatric/Behavioral: Positive for dysphoric mood.  All other systems reviewed and are negative.       Objective:   Physical Exam  Constitutional: She is oriented to person, place, and time. She appears well-developed and well-nourished.  HENT:  Head: Normocephalic and atraumatic.  Eyes: Conjunctivae normal and EOM are normal. Pupils are equal, round, and reactive to light.  Neck: Normal range of motion.  Musculoskeletal:       Right hip: She exhibits tenderness. She exhibits normal range of motion and normal strength.  Lumbar back: She exhibits tenderness.       Right upper leg: She exhibits deformity.       Left thigh and leg atrophy  Neurological: She is alert and oriented to person, place, and time. She has normal reflexes. She displays atrophy. She exhibits abnormal muscle tone. Gait abnormal.       Trace  Knee extension trace ankle dorsiflexion on left side,  2 minus hip flexion on the left 5/5 strength in the right lobe strandy Decreased muscle tone in the left lower tremor  Psychiatric: She has a normal mood and affect.          Assessment & Plan:  1. 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 She has had a chronic gait disturbance and uses a KAFO  for ambulation the left side. She gets decent relief from sacroiliac injections but this is short-term. She gets 4-5 months relief with the sacroiliac radiofrequency procedure is looking for a longer-term solution. I've referred her to orthopedic spine surgeon to evaluate for sacroiliac fusion. There is a newer minimally invasive procedure. 2. Lumbar spondylosis this is shooting to her pain she gets stiffness with prolonged sitting.  3. Chronic myofascial pain syndrome lumbar paraspinal muscles. Continue current work activities with current restrictions I'll see her back in about 6 weeks Continue tramadol 3 times a day when necessary

## 2012-05-15 ENCOUNTER — Other Ambulatory Visit: Payer: Self-pay | Admitting: Physical Medicine & Rehabilitation

## 2012-05-16 ENCOUNTER — Telehealth: Payer: Self-pay

## 2012-05-16 NOTE — Telephone Encounter (Signed)
Patient called to follow up on referral to Dr Azucena Cecil?  She said she called his office and they are waiting on [place] to send referral.

## 2012-05-22 ENCOUNTER — Telehealth: Payer: Self-pay | Admitting: *Deleted

## 2012-05-22 NOTE — Telephone Encounter (Signed)
We referred her to see Dr Yevette Edwards. She has an appt 05/29/12 @ 1:45. Please fax over office notes to his office.

## 2012-05-25 ENCOUNTER — Telehealth: Payer: Self-pay

## 2012-05-25 NOTE — Telephone Encounter (Signed)
Patient says that at her last appointment they discussed changing her hydrocodone dose.  She says this has not been done.  Please advise.

## 2012-05-26 NOTE — Telephone Encounter (Signed)
This would start with the next prescription on February 10 we can go up to 7.5 mg hydrocodone #120

## 2012-05-29 NOTE — Telephone Encounter (Signed)
Left message for patient to advise her about her medication.

## 2012-05-30 ENCOUNTER — Telehealth: Payer: Self-pay

## 2012-05-30 NOTE — Telephone Encounter (Signed)
Patient called with questions regarding her medications. 

## 2012-05-30 NOTE — Telephone Encounter (Signed)
Patient returning call. She states her and Dr. Wynn Banker talked about increasing her 5mg  Hydrocodone to qid. Not sure why the dosage increased. Please advise.

## 2012-05-30 NOTE — Telephone Encounter (Signed)
And none of this was documented so I'll take her word for it Next prescription for hydrocodone 5/325 one by mouth 4 times a day #120

## 2012-05-31 NOTE — Telephone Encounter (Signed)
Just FYI--it looks like on 05/12/12 you ordered hydrocodone 5-325 1 qid (already)

## 2012-06-01 ENCOUNTER — Telehealth: Payer: Self-pay

## 2012-06-01 MED ORDER — HYDROCODONE-ACETAMINOPHEN 5-325 MG PO TABS: 1.0000 | ORAL_TABLET | Freq: Four times a day (QID) | ORAL | Status: DC | PRN

## 2012-06-01 NOTE — Telephone Encounter (Signed)
Patient called to find out status of hydrocodone.  Per patient she was not give a script at her last visit.  Therefore this was ordered today and called into her pharmacy.  Patient is aware.

## 2012-06-11 ENCOUNTER — Ambulatory Visit: Payer: Self-pay | Admitting: Family Medicine

## 2012-06-12 ENCOUNTER — Telehealth: Payer: Self-pay

## 2012-06-12 NOTE — Telephone Encounter (Signed)
Left message for patient to call office and let us know what medication she was put on at the urgent care.

## 2012-06-12 NOTE — Telephone Encounter (Signed)
Patient called to say she had to go to [facility] for her back pain.  She said they gave her a medication and she also wanted to discuss her surgery she will be having on her hip.  Please call.

## 2012-06-13 ENCOUNTER — Ambulatory Visit: Payer: Self-pay | Admitting: Specialist

## 2012-06-13 LAB — BASIC METABOLIC PANEL
Anion Gap: 5 — ABNORMAL LOW (ref 7–16)
Co2: 30 mmol/L (ref 21–32)
Creatinine: 0.47 mg/dL — ABNORMAL LOW (ref 0.60–1.30)
EGFR (African American): >60
EGFR (Non-African Amer.): >60
Glucose: 78 mg/dL (ref 65–99)
Osmolality: 279 (ref 275–301)
Potassium: 4.3 mmol/L (ref 3.5–5.1)

## 2012-06-13 NOTE — Telephone Encounter (Signed)
Patients was given oxycontin at the urgent care.  She did get this filled.  She was aware she was under contract.  She has hip surgery Monday.  Patient says she was in so much pain that she didn't even think about having a contract.  Advised her discharge from the practice is appropriate.  Please advise.

## 2012-06-13 NOTE — Telephone Encounter (Signed)
I tried calling the patient back she was not in I told her to call me on Thursday No further narcotic prescriptions until I clarify the situation.

## 2012-06-14 ENCOUNTER — Telehealth: Payer: Self-pay

## 2012-06-14 NOTE — Telephone Encounter (Signed)
Patient returned call. Please call.

## 2012-06-17 ENCOUNTER — Other Ambulatory Visit: Payer: Self-pay

## 2012-06-19 ENCOUNTER — Ambulatory Visit: Payer: Self-pay | Admitting: Specialist

## 2012-06-19 HISTORY — PX: HIP SURGERY: SHX245

## 2012-06-20 ENCOUNTER — Telehealth: Payer: Self-pay | Admitting: Physical Medicine & Rehabilitation

## 2012-06-20 NOTE — Telephone Encounter (Signed)
Was given the same pain medicine, did not fill it.  Med release done.  Has follow up 06/26/12.

## 2012-06-26 ENCOUNTER — Encounter: Payer: Self-pay | Admitting: Physical Medicine & Rehabilitation

## 2012-06-26 ENCOUNTER — Encounter: Payer: Medicare Other | Attending: Physical Medicine & Rehabilitation

## 2012-06-26 ENCOUNTER — Ambulatory Visit (HOSPITAL_BASED_OUTPATIENT_CLINIC_OR_DEPARTMENT_OTHER): Payer: Medicare Other | Admitting: Physical Medicine & Rehabilitation

## 2012-06-26 VITALS — BP 110/63 | HR 62 | Resp 14 | Ht 61.0 in | Wt 133.0 lb

## 2012-06-26 DIAGNOSIS — R269 Unspecified abnormalities of gait and mobility: Secondary | ICD-10-CM | POA: Insufficient documentation

## 2012-06-26 DIAGNOSIS — M7061 Trochanteric bursitis, right hip: Secondary | ICD-10-CM

## 2012-06-26 DIAGNOSIS — M533 Sacrococcygeal disorders, not elsewhere classified: Secondary | ICD-10-CM

## 2012-06-26 DIAGNOSIS — M76899 Other specified enthesopathies of unspecified lower limb, excluding foot: Secondary | ICD-10-CM | POA: Insufficient documentation

## 2012-06-26 DIAGNOSIS — R29898 Other symptoms and signs involving the musculoskeletal system: Secondary | ICD-10-CM | POA: Insufficient documentation

## 2012-06-26 DIAGNOSIS — B91 Sequelae of poliomyelitis: Secondary | ICD-10-CM

## 2012-06-26 DIAGNOSIS — R209 Unspecified disturbances of skin sensation: Secondary | ICD-10-CM | POA: Insufficient documentation

## 2012-06-26 DIAGNOSIS — G14 Postpolio syndrome: Secondary | ICD-10-CM

## 2012-06-26 DIAGNOSIS — M6250 Muscle wasting and atrophy, not elsewhere classified, unspecified site: Secondary | ICD-10-CM | POA: Insufficient documentation

## 2012-06-26 DIAGNOSIS — M25559 Pain in unspecified hip: Secondary | ICD-10-CM | POA: Insufficient documentation

## 2012-06-26 MED ORDER — HYDROCODONE-ACETAMINOPHEN 5-325 MG PO TABS: 1.0000 | ORAL_TABLET | Freq: Four times a day (QID) | ORAL | Status: DC | PRN

## 2012-06-26 NOTE — Patient Instructions (Addendum)
Prescription for hydrocodone given today. Do not fill the one from Dr. Katrinka Blazing. If sacroiliac pain persists for greater than 2 more weeks please call to make an appointment for repeat SI joint injection May consider sacroiliac RF on the left side if pain persists

## 2012-06-26 NOTE — Progress Notes (Signed)
Subjective:    Patient ID: Andrea [name], female    DOB: 04-26-1951, 62 y.o.   MRN: [id number]  HPI Right hip arthroscopic bursectomy 06/19/2012 by Dr. Katrinka Blazing in [place]. No complications. Left Buttocks pain after fall a couple days ago Pain Inventory Average Pain 4 Pain Right Now 9 My pain is constant  In the last 24 hours, has pain interfered with the following? General activity 4 Relation with others 3 Enjoyment of life 3 What TIME of day is your pain at its worst? varies Sleep (in general) Fair  Pain is worse with: walking, bending, sitting and standing Pain improves with: rest, heat/ice and medication Relief from Meds: 4  Mobility walk with assistance use a cane use a walker how many minutes can you walk? 5 ability to climb steps?  yes do you drive?  yes use a wheelchair Do you have any goals in this area?  no  Function I need assistance with the following:  household duties and shopping Do you have any goals in this area?  no  Neuro/Psych weakness  Prior Studies x-rays To 01/20/2013 thoracic spine showed osteophytes starting at T7-T10. T11 and T12 were not as well visualized. No evidence of compression fracture Physicians involved in your care Any changes since last visit?  no   Family History  Problem Relation Age of Onset  . Cancer Mother   . Cancer Father    History   Social History  . Marital Status: Married    Spouse Name: N/A    Number of Children: N/A  . Years of Education: N/A   Social History Main Topics  . Smoking status: Current Every Day Smoker -- 0.50 packs/day for 45 years    Types: Cigarettes  . Smokeless tobacco: Never Used  . Alcohol Use: None  . Drug Use: None  . Sexually Active: None   Other Topics Concern  . None   Social History Narrative  . None   Past Surgical History  Procedure Laterality Date  . Abdominal hysterectomy    . Spine surgery    . Shoulder arthroscopy w/ rotator cuff repair    . Carpal tunnel  release     Past Medical History  Diagnosis Date  . Disorders of sacrum   . Cervical post-laminectomy syndrome   . Cubital tunnel syndrome   . Unspecified musculoskeletal disorders and symptoms referable to neck     cervical/trapezius  . Lateral epicondylitis  of elbow   . Disturbance of skin sensation   . Late effects of acute poliomyelitis   . Diabetes mellitus   . Cancer   . Hypertension    BP 110/63  Pulse 62  Resp 14  Ht 5\' 1"  (1.549 m)  Wt 133 lb (60.328 kg)  BMI 25.14 kg/m2  SpO2 97%     Review of Systems  Musculoskeletal: Positive for back pain.  Neurological: Positive for weakness.  All other systems reviewed and are negative.       Objective:   Physical Exam  Right hip area has 2 small incisions covered with Band-Aids no erythema no evidence of drainage Left hip has tenderness palpation Left PSIS tenderness palpation Left quad 0 strength with KAFO. Right lower extremity negative straight leg raising Mood and affect are appropriate General no acute distress      Assessment & Plan:  1. Chronic sacroiliac dysfunction has had good results withradiofrequency to 11/23/2011 sacroiliac right Most of her sacroiliac pain is on the left side now. She may  benefit from injection but she did just have a fall and so this may be just an acute exacerbation. Will wait until next visit to further assess 2. Right trochanteric bursitis chronic now postoperative day #7 Will followup with orthopedics on this

## 2012-06-29 ENCOUNTER — Ambulatory Visit: Payer: Self-pay | Admitting: General Surgery

## 2012-06-30 ENCOUNTER — Other Ambulatory Visit: Payer: Self-pay

## 2012-06-30 MED ORDER — GABAPENTIN 300 MG PO CAPS: 300.0000 mg | ORAL_CAPSULE | Freq: Three times a day (TID) | ORAL | Status: DC

## 2012-07-03 ENCOUNTER — Other Ambulatory Visit: Payer: Self-pay | Admitting: Physical Medicine & Rehabilitation

## 2012-07-21 ENCOUNTER — Encounter: Payer: Self-pay | Admitting: *Deleted

## 2012-07-25 ENCOUNTER — Ambulatory Visit: Payer: Self-pay | Admitting: Specialist

## 2012-07-26 ENCOUNTER — Encounter: Payer: Self-pay | Admitting: General Surgery

## 2012-07-26 ENCOUNTER — Ambulatory Visit (INDEPENDENT_AMBULATORY_CARE_PROVIDER_SITE_OTHER): Payer: Medicare Other | Admitting: General Surgery

## 2012-07-26 VITALS — BP 110/62 | HR 70 | Resp 16 | Ht 61.0 in | Wt 130.0 lb

## 2012-07-26 DIAGNOSIS — Z853 Personal history of malignant neoplasm of breast: Secondary | ICD-10-CM

## 2012-07-26 DIAGNOSIS — M1990 Unspecified osteoarthritis, unspecified site: Secondary | ICD-10-CM

## 2012-07-26 DIAGNOSIS — B91 Sequelae of poliomyelitis: Secondary | ICD-10-CM

## 2012-07-26 DIAGNOSIS — M129 Arthropathy, unspecified: Secondary | ICD-10-CM

## 2012-07-26 DIAGNOSIS — I10 Essential (primary) hypertension: Secondary | ICD-10-CM

## 2012-07-26 NOTE — Progress Notes (Signed)
Patient ID: Andrea [name], female   DOB: [birth year], 61 y.o.   MRN: [id number]  Chief Complaint  Patient presents with  . Follow-up    mammogram    HPI Andrea [name] is a 62 y.o. female following up from her mammogram done @[facility]  on 06/04/12 cat 2 . Patient had a right breast lumpectomy in 2008 4 DCIS followed by whole breast radiation. In August 2011 she underwent salvage mastectomy and sentinel node biopsy. 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 Patient reports no new breast problems. HPI  Past Medical History  Diagnosis Date  . Disorders of sacrum   . Cervical post-laminectomy syndrome   . Cubital tunnel syndrome   . Unspecified musculoskeletal disorders and symptoms referable to neck     cervical/trapezius  . Lateral epicondylitis  of elbow   . Disturbance of skin sensation   . Late effects of acute poliomyelitis   . Hypertension   . Parkinson's disease 2011  . Peripheral vascular disease 2011    venous stasis   . Cancer [id number],5366      high-grade DCIS involving the right breast    Past Surgical History  Procedure Laterality Date  . Spine surgery    . Shoulder arthroscopy w/ rotator cuff repair Bilateral [id number]  . Carpal tunnel release    . Abdominal hysterectomy  1997  . Colonoscopy  2010    Dr. Servando Snare  . Mastectomy Right 2011  . Breast lumpectomy Right 2008  . Elbow surgery Left 2011  . Hip surgery Right 06/19/12    Family History  Problem Relation Age of Onset  . Cancer Mother   . Cancer Father     Social History History  Substance Use Topics  . Smoking status: Current Every Day Smoker -- 0.50 packs/day for 45 years    Types: Cigarettes  . Smokeless tobacco: Never Used  . Alcohol Use: No    Allergies  Allergen Reactions  . Lisinopril     Raises BP    Current Outpatient Prescriptions  Medication Sig Dispense Refill  . amLODipine (NORVASC) 10 MG tablet Take 10 mg by mouth daily.      Marland Kitchen atenolol (TENORMIN) 25 MG tablet Take 25 mg  by mouth daily.      . cholecalciferol (VITAMIN D) 1000 UNITS tablet Take 2,000 Units by mouth daily.      Marland Kitchen estradiol (ESTRACE) 2 MG tablet Take 2 mg by mouth daily.      Marland Kitchen FLUoxetine (PROZAC) 40 MG capsule       . gabapentin (NEURONTIN) 300 MG capsule Take 1 capsule (300 mg total) by mouth 3 (three) times daily. BID to TID  90 capsule  4  . lidocaine (LIDODERM) 5 % Place 1 patch onto the skin daily. Remove & Discard patch within 12 hours or as directed by MD      . losartan (COZAAR) 50 MG tablet Take 50 mg by mouth daily.      . meloxicam (MOBIC) 15 MG tablet Take 15 mg by mouth daily.      . methocarbamol (ROBAXIN) 500 MG tablet       . methocarbamol (ROBAXIN) 500 MG tablet TAKE 1 TABLET BY MOUTH THREE TIMES DAILY  90 tablet  0  . omeprazole (PRILOSEC) 40 MG capsule Take 1 capsule by mouth Daily.      . polyethylene glycol (MIRALAX / GLYCOLAX) packet Take 17 g by mouth as needed.      . pravastatin (PRAVACHOL) 40  MG tablet Take 40 mg by mouth daily.      Marland Kitchen HYDROcodone-acetaminophen (NORCO/VICODIN) 5-325 MG per tablet TAKE 1 TABLET BY MOUTH EVERY 6 HOURS AS NEEDED FOR PAIN  120 tablet  0  . oxyCODONE (OXY IR/ROXICODONE) 5 MG immediate release tablet        No current facility-administered medications for this visit.    Review of Systems Review of Systems  Constitutional: Negative.   Respiratory: Negative.   Cardiovascular: Negative.     Blood pressure 110/62, pulse 70, resp. rate 16, height 5\' 1"  (1.549 m), weight 130 lb (58.968 kg).  Physical Exam Physical Exam  Constitutional: She appears well-developed and well-nourished.  Neck: Normal range of motion. Neck supple.  Cardiovascular: Normal rate, regular rhythm and normal heart sounds.   Pulmonary/Chest: Effort normal and breath sounds normal. Left breast exhibits no inverted nipple, no mass, no nipple discharge, no skin change and no tenderness.  Right mastectomy site well healed.  Lymphadenopathy:       Right: No  supraclavicular adenopathy present.       Left: No supraclavicular adenopathy present.  No axillary adenopathy is identified.    Data Review:  Left breast mammogram dated 06/29/2012 showed no interval change. BIRAD-2.  Assessment    Right breast cancer, no evidence of local regional recurrence.    Plan    Followup exam in one year with repeat left breast mammogram per       Earline Mayotte 07/29/2012, 10:27 AM

## 2012-07-27 ENCOUNTER — Other Ambulatory Visit: Payer: Self-pay | Admitting: Physical Medicine & Rehabilitation

## 2012-07-28 ENCOUNTER — Other Ambulatory Visit: Payer: Self-pay | Admitting: Physical Medicine & Rehabilitation

## 2012-07-28 NOTE — Telephone Encounter (Signed)
Patient informed hydrocodone refilled.

## 2012-08-03 ENCOUNTER — Encounter: Payer: Self-pay | Admitting: General Surgery

## 2012-08-04 ENCOUNTER — Telehealth: Payer: Self-pay

## 2012-08-04 ENCOUNTER — Encounter: Payer: Self-pay | Admitting: Physical Medicine & Rehabilitation

## 2012-08-04 NOTE — Telephone Encounter (Signed)
Patient called to let us know she has torn her rotator cuff and needs surgery.  She says the hydrocodone is not helping her pain and she would like to take oxycodone she has.  Please advise.

## 2012-08-04 NOTE — Telephone Encounter (Signed)
May take oxycodone Will need ofice visit on a monthly basis to refill When is surgery scheduled?

## 2012-08-07 NOTE — Telephone Encounter (Signed)
Patient informed of directions via mychart since she reached out that way.

## 2012-08-07 NOTE — Telephone Encounter (Signed)
Patient called regarding her medication increase request.

## 2012-08-08 ENCOUNTER — Ambulatory Visit: Payer: Self-pay | Admitting: Specialist

## 2012-08-09 ENCOUNTER — Other Ambulatory Visit: Payer: Self-pay | Admitting: Physical Medicine & Rehabilitation

## 2012-08-11 ENCOUNTER — Ambulatory Visit: Payer: Self-pay | Admitting: Specialist

## 2012-08-12 ENCOUNTER — Encounter: Payer: Self-pay | Admitting: Physical Medicine & Rehabilitation

## 2012-09-08 ENCOUNTER — Telehealth: Payer: Self-pay

## 2012-09-08 NOTE — Telephone Encounter (Signed)
Patient request hydrocodone and methocarbamol refill.  Last message she was on oxycodone.  She has an appointment 09/21/12.  Left message for her to call for clarification.  Please advise.

## 2012-09-09 NOTE — Telephone Encounter (Signed)
Please find out if ortho is still prescribing post op

## 2012-09-11 NOTE — Telephone Encounter (Signed)
Patient is no longer taking oxycodone.  She will not be getting any other refills from her surgeon.

## 2012-09-11 NOTE — Telephone Encounter (Signed)
OK to refill hydrocodone 5/325 #120 one by mouth 4 times a day when necessary

## 2012-09-12 MED ORDER — METHOCARBAMOL 500 MG PO TABS: ORAL_TABLET | ORAL | Status: DC

## 2012-09-12 MED ORDER — HYDROCODONE-ACETAMINOPHEN 5-325 MG PO TABS: ORAL_TABLET | ORAL | Status: DC

## 2012-09-12 NOTE — Telephone Encounter (Signed)
Hydrocodone refilled.  Left message informing patient.

## 2012-09-19 ENCOUNTER — Ambulatory Visit: Payer: Medicare Other | Admitting: Physical Medicine & Rehabilitation

## 2012-09-21 ENCOUNTER — Ambulatory Visit: Payer: Medicare Other | Admitting: Physical Medicine & Rehabilitation

## 2012-10-05 ENCOUNTER — Ambulatory Visit (HOSPITAL_BASED_OUTPATIENT_CLINIC_OR_DEPARTMENT_OTHER): Payer: Medicare Other | Admitting: Physical Medicine & Rehabilitation

## 2012-10-05 ENCOUNTER — Encounter: Payer: Self-pay | Admitting: Physical Medicine & Rehabilitation

## 2012-10-05 ENCOUNTER — Encounter: Payer: Medicare Other | Attending: Physical Medicine & Rehabilitation

## 2012-10-05 VITALS — BP 116/57 | HR 64 | Resp 14 | Ht 61.0 in | Wt 128.0 lb

## 2012-10-05 DIAGNOSIS — R209 Unspecified disturbances of skin sensation: Secondary | ICD-10-CM | POA: Insufficient documentation

## 2012-10-05 DIAGNOSIS — M533 Sacrococcygeal disorders, not elsewhere classified: Secondary | ICD-10-CM

## 2012-10-05 DIAGNOSIS — Z79899 Other long term (current) drug therapy: Secondary | ICD-10-CM

## 2012-10-05 DIAGNOSIS — M7520 Bicipital tendinitis, unspecified shoulder: Secondary | ICD-10-CM | POA: Insufficient documentation

## 2012-10-05 DIAGNOSIS — Z5181 Encounter for therapeutic drug level monitoring: Secondary | ICD-10-CM

## 2012-10-05 DIAGNOSIS — B91 Sequelae of poliomyelitis: Secondary | ICD-10-CM

## 2012-10-05 DIAGNOSIS — M76899 Other specified enthesopathies of unspecified lower limb, excluding foot: Secondary | ICD-10-CM | POA: Insufficient documentation

## 2012-10-05 MED ORDER — DICLOFENAC SODIUM 1 % TD GEL: 2.0000 g | Freq: Four times a day (QID) | TRANSDERMAL | Status: DC

## 2012-10-05 NOTE — Progress Notes (Signed)
Subjective:    Patient ID: Andrea [name], female    DOB: [birth year], 62 y.o.   MRN: [id number]  HPI R hip arthroscopic bursectomy 06/19/12, with good results.  No further pain meds from ortho. SI pain doing a little better.  Left shoulder surgery open rotator cuff repair, partial clavicular resection, bone spur No post op rehab  R biceps throbbing pain Pain Inventory Average Pain 4 Pain Right Now 2 My pain is intermittent and aching  In the last 24 hours, has pain interfered with the following? General activity 3 Relation with others 1 Enjoyment of life 4 What TIME of day is your pain at its worst? evening Sleep (in general) Fair  Pain is worse with: walking, bending and standing Pain improves with: rest, heat/ice and medication Relief from Meds: 5  Mobility use a cane how many minutes can you walk? 10 ability to climb steps?  yes do you drive?  yes Do you have any goals in this area?  no  Function I need assistance with the following:  household duties and shopping Do you have any goals in this area?  no  Neuro/Psych weakness tremor tingling trouble walking depression  Prior Studies CT/MRI  Physicians involved in your care Any changes since last visit?  no   Family History  Problem Relation Age of Onset  . Cancer Mother   . Cancer Father    History   Social History  . Marital Status: Married    Spouse Name: N/A    Number of Children: N/A  . Years of Education: N/A   Social History Main Topics  . Smoking status: Current Every Day Smoker -- 0.50 packs/day for 45 years    Types: Cigarettes  . Smokeless tobacco: Never Used  . Alcohol Use: No  . Drug Use: No  . Sexually Active: None   Other Topics Concern  . None   Social History Narrative  . None   Past Surgical History  Procedure Laterality Date  . Spine surgery    . Shoulder arthroscopy w/ rotator cuff repair Bilateral [id number]  . Carpal tunnel release    . Abdominal hysterectomy   1997  . Colonoscopy  2010    Dr. Servando Snare  . Mastectomy Right 2011  . Breast lumpectomy Right 2008  . Elbow surgery Left 2011  . Hip surgery Right 06/19/12   Past Medical History  Diagnosis Date  . Disorders of sacrum   . Cervical post-laminectomy syndrome   . Cubital tunnel syndrome   . Unspecified musculoskeletal disorders and symptoms referable to neck     cervical/trapezius  . Lateral epicondylitis  of elbow   . Disturbance of skin sensation   . Late effects of acute poliomyelitis   . Hypertension   . Parkinson's disease 2011  . Peripheral vascular disease 2011    venous stasis   . Cancer [phone]      high-grade DCIS involving the right breast   BP 116/57  Pulse 64  Resp 14  Ht 5\' 1"  (1.549 m)  Wt 128 lb (58.06 kg)  BMI 24.2 kg/m2  SpO2 95%     Review of Systems  Musculoskeletal: Positive for gait problem.  Neurological: Positive for tremors and weakness.  Psychiatric/Behavioral: Positive for dysphoric mood.       Objective:   Physical Exam  Musculoskeletal:       Left shoulder: She exhibits normal range of motion.   Right shoulder mild tenderness at the bicipital groove Negative impingement signs  No tenderness over the biceps muscle belly Muscle strength is normal in both upper extremities 0 at the left quadricep 4+ at the right quadricep Right ankle good range of motion  Back has no tenderness palpation. Mild tenderness to palpation over the left trochanteric bursa       Assessment & Plan:  1. Sacroiliac joint dysfunction doing well no reason for injection at this point 2 left trochanteric bursitis if this worsens an injection may be helpful   3. Right bicipital tendinitis we'll prescribe diclofenac gel  Will continue hydrocodone but taking it more or less 3 times per day. Next prescription can be for 3 times a day  4. Paresthesias right anterior shin. No obvious signs of radiculopathy although this is in the differential diagnosis. Will  increase nighttime dose of gabapentin to 600 mg  Return to clinic 3 months or sooner if right shoulder does not improve or left hip becomes more painful.   Over half of the 25 min visit was spent counseling and coordinating care.

## 2012-10-05 NOTE — Patient Instructions (Signed)
Diclofenac gel 4 times per day to the right shoulder  May increase her gabapentin dose at night to 600 mg  Continue hydrocodone average of 3 tablets per day  If the right shoulder is not better next visit will check an ultrasound

## 2012-10-19 ENCOUNTER — Telehealth: Payer: Self-pay

## 2012-10-19 MED ORDER — HYDROCODONE-ACETAMINOPHEN 5-325 MG PO TABS: ORAL_TABLET | ORAL | Status: DC

## 2012-10-19 NOTE — Telephone Encounter (Signed)
Hydrocodone called into [facility].

## 2012-10-31 ENCOUNTER — Other Ambulatory Visit: Payer: Self-pay

## 2012-10-31 MED ORDER — METHOCARBAMOL 500 MG PO TABS: ORAL_TABLET | ORAL | Status: DC

## 2012-11-16 ENCOUNTER — Other Ambulatory Visit: Payer: Self-pay

## 2012-11-16 MED ORDER — GABAPENTIN 300 MG PO CAPS: 300.0000 mg | ORAL_CAPSULE | Freq: Three times a day (TID) | ORAL | Status: DC

## 2012-11-23 ENCOUNTER — Other Ambulatory Visit: Payer: Self-pay

## 2012-11-23 MED ORDER — HYDROCODONE-ACETAMINOPHEN 5-325 MG PO TABS: ORAL_TABLET | ORAL | Status: DC

## 2012-11-28 ENCOUNTER — Encounter: Payer: Self-pay | Admitting: Physical Medicine & Rehabilitation

## 2012-11-29 ENCOUNTER — Telehealth: Payer: Self-pay | Admitting: *Deleted

## 2012-11-29 NOTE — Telephone Encounter (Signed)
Forwarded to [place] to schedule.  Rainn's husband notified we will be scheduling this procedure.

## 2012-11-29 NOTE — Telephone Encounter (Signed)
lvm via [phone]  @ 1050 am 07.30.14 requesting a call back to schedule procedure... Left my name number and extension.Marland KitchenMarland Kitchen

## 2012-11-29 NOTE — Telephone Encounter (Signed)
Yes please call and schedule R SI RF

## 2012-11-29 NOTE — Telephone Encounter (Signed)
From my chart:  Just wanted to see if it would be ok to schedule RF treatment for rt SI and rt Low back for sometime soon, as these areas are bothering me more lately? If ok I will call and schedule.  Thank you Andrea [name]

## 2012-12-20 ENCOUNTER — Other Ambulatory Visit: Payer: Self-pay

## 2012-12-20 MED ORDER — HYDROCODONE-ACETAMINOPHEN 5-325 MG PO TABS: ORAL_TABLET | ORAL | Status: DC

## 2012-12-28 ENCOUNTER — Encounter: Payer: Self-pay | Admitting: Physical Medicine & Rehabilitation

## 2012-12-28 ENCOUNTER — Ambulatory Visit (HOSPITAL_BASED_OUTPATIENT_CLINIC_OR_DEPARTMENT_OTHER): Payer: Medicare Other | Admitting: Physical Medicine & Rehabilitation

## 2012-12-28 ENCOUNTER — Encounter: Payer: Medicare Other | Attending: Physical Medicine & Rehabilitation

## 2012-12-28 VITALS — BP 123/68 | HR 66 | Resp 14 | Ht 61.0 in | Wt 128.0 lb

## 2012-12-28 DIAGNOSIS — Z79899 Other long term (current) drug therapy: Secondary | ICD-10-CM | POA: Insufficient documentation

## 2012-12-28 DIAGNOSIS — M533 Sacrococcygeal disorders, not elsewhere classified: Secondary | ICD-10-CM

## 2012-12-28 DIAGNOSIS — R209 Unspecified disturbances of skin sensation: Secondary | ICD-10-CM | POA: Insufficient documentation

## 2012-12-28 DIAGNOSIS — M47817 Spondylosis without myelopathy or radiculopathy, lumbosacral region: Secondary | ICD-10-CM

## 2012-12-28 DIAGNOSIS — M7520 Bicipital tendinitis, unspecified shoulder: Secondary | ICD-10-CM | POA: Insufficient documentation

## 2012-12-28 DIAGNOSIS — M76899 Other specified enthesopathies of unspecified lower limb, excluding foot: Secondary | ICD-10-CM | POA: Insufficient documentation

## 2012-12-28 NOTE — Progress Notes (Signed)
Right  L4 medial branch and L5 dorsal ramus lumbosacral and S1 S2-S3 dorsal ramus radio frequency ablation Indication sacroiliac pain demonstrated by greater than 50% release to multiple sets of sacroiliac injections. She has responded to previous radio frequency ablation of the SI joint greater than 1 year ago. Pain has returned to that area Informed consent was obtained after describing risks and benefits of dictation dictation these include bleeding bruising and infection she elects to proceed and has given written consent Patient placed prone on fluoroscopy table Betadine prep sterile draped 25-gauge inch  needle was used to inject skin and subcutaneous tissue with 1% lidocaine. Then a 20-gauge 10 cm RF needle with a 10 mm curved active tip was inserted targeting the junction of right S1 and sacral ala. Bone contact made lateral films demonstrated adequate depth. Then the right L5 SA P. transverse process junction was targeted bone contact was made. Lateral views confirm adequate depth. Then sensory stimulation at 50 Hz followed by a motor stimulation at 2 Hz demonstrated adequate needle positioning. Then 1 cc of the solution containing 1 cc of 4 mg per cc dexamethasone and 4 cc of 1% lidocaine  was injected at each site followed by radio frequency lesion being 80C for 90 seconds Then the S1 S2-S3 lateral aspect of sacral foramen or targeted. Afterexcuse subcutaneous tissue 20-gauge 10 cm RF needle with a 10 mm curved active tip was positioned at each target. After confirming no motor stimulation at 2 Hz, 1 cc of the dexamethasone and lidocaine solution was injected at each site and radio frequency lesion being performed 80C for 90 seconds. Patient tolerated procedure well. Post procedure instructions given

## 2012-12-28 NOTE — Patient Instructions (Addendum)
Double up on gabapentin today We will try switching to Tylenol #4 1 tablet 4 times per day when current  Rx done

## 2012-12-28 NOTE — Progress Notes (Signed)
  PROCEDURE RECORD [facility]   Name: Andrea [name] DOB:August 27, 1950 MRN: [id number]  Date:12/28/2012  Physician: Claudette Laws, MD    Nurse/CMA: Kalie Cabral,CMA/Shumaker, RN  Allergies:  Allergies  Allergen Reactions  . Lisinopril     Raises BP    Consent Signed: yes  Is patient diabetic? no    Pregnant: no LMP: No LMP recorded. Patient has had a hysterectomy. (age 63-55)  Anticoagulants: no Anti-inflammatory: no Antibiotics: no  Procedure: Right SI Radiofrequency neurotomy  Position: Prone Start Time:  1:19 End Time: 1:47  Fluoro Time: 49 seconds  RN/CMA Vitali Seibert, CMA Shumaker, RN    Time 100 1:55    BP 123/68 131/54    Pulse 68 71    Respirations 14 14    O2 Sat 98 976    S/S 6 6    Pain Level 6/10 numb     D/C home with Husband-Andrea [name], [name] & O X 3, D/C instructions reviewed, and sits independently.

## 2013-01-04 ENCOUNTER — Encounter: Payer: Self-pay | Admitting: Physical Medicine & Rehabilitation

## 2013-01-04 ENCOUNTER — Ambulatory Visit: Payer: Medicare Other | Admitting: Physical Medicine & Rehabilitation

## 2013-01-05 MED ORDER — GABAPENTIN 300 MG PO CAPS: 300.0000 mg | ORAL_CAPSULE | Freq: Four times a day (QID) | ORAL | Status: DC

## 2013-01-15 ENCOUNTER — Telehealth: Payer: Self-pay

## 2013-01-15 NOTE — Telephone Encounter (Signed)
Patient had a RF on 12/28/12. She called and stated she is now having pain in lower back, buttocks, and outer left leg. She would like to know if she should make an appointment to come back here or go see her orthopedic doctor? Please advise.

## 2013-01-15 NOTE — Telephone Encounter (Signed)
We did a right-sided procedure so I don'tthink the left-sided symptoms are related. She may want to see her orthopedist for this

## 2013-01-15 NOTE — Telephone Encounter (Signed)
Called patient and let her know that she may want to F/U with her orthopedist, she said okay she would contact that clinic. Told patient if she had any further questions or concerns to give [place] a call back.

## 2013-01-18 ENCOUNTER — Telehealth: Payer: Self-pay

## 2013-01-18 MED ORDER — ACETAMINOPHEN-CODEINE #4 300-60 MG PO TABS: 1.0000 | ORAL_TABLET | Freq: Four times a day (QID) | ORAL | Status: DC | PRN

## 2013-01-18 NOTE — Telephone Encounter (Signed)
please call in tylenol #4   #120 one po qid no refill. see order

## 2013-01-18 NOTE — Telephone Encounter (Signed)
Patient is finishing her hydrocodone and was ready to start tylenol #4.  Please give direction.

## 2013-01-19 NOTE — Telephone Encounter (Signed)
Left message informing patient tylenol #4 was called into [facility].

## 2013-01-24 ENCOUNTER — Ambulatory Visit: Payer: Self-pay | Admitting: Specialist

## 2013-02-19 ENCOUNTER — Encounter: Payer: Self-pay | Admitting: Physical Medicine & Rehabilitation

## 2013-02-19 ENCOUNTER — Ambulatory Visit (HOSPITAL_BASED_OUTPATIENT_CLINIC_OR_DEPARTMENT_OTHER): Payer: Medicare Other | Admitting: Physical Medicine & Rehabilitation

## 2013-02-19 ENCOUNTER — Encounter: Payer: Medicare Other | Attending: Physical Medicine & Rehabilitation

## 2013-02-19 VITALS — BP 123/66 | HR 69 | Resp 14 | Ht 61.0 in | Wt 131.4 lb

## 2013-02-19 DIAGNOSIS — M7520 Bicipital tendinitis, unspecified shoulder: Secondary | ICD-10-CM | POA: Insufficient documentation

## 2013-02-19 DIAGNOSIS — M545 Low back pain, unspecified: Secondary | ICD-10-CM | POA: Insufficient documentation

## 2013-02-19 DIAGNOSIS — M533 Sacrococcygeal disorders, not elsewhere classified: Secondary | ICD-10-CM

## 2013-02-19 DIAGNOSIS — G8929 Other chronic pain: Secondary | ICD-10-CM | POA: Insufficient documentation

## 2013-02-19 DIAGNOSIS — R209 Unspecified disturbances of skin sensation: Secondary | ICD-10-CM | POA: Insufficient documentation

## 2013-02-19 DIAGNOSIS — B91 Sequelae of poliomyelitis: Secondary | ICD-10-CM

## 2013-02-19 DIAGNOSIS — G14 Postpolio syndrome: Secondary | ICD-10-CM

## 2013-02-19 DIAGNOSIS — M76899 Other specified enthesopathies of unspecified lower limb, excluding foot: Secondary | ICD-10-CM | POA: Insufficient documentation

## 2013-02-19 DIAGNOSIS — Z79899 Other long term (current) drug therapy: Secondary | ICD-10-CM | POA: Insufficient documentation

## 2013-02-19 MED ORDER — ACETAMINOPHEN-CODEINE #4 300-60 MG PO TABS: 1.0000 | ORAL_TABLET | Freq: Four times a day (QID) | ORAL | Status: DC | PRN

## 2013-02-19 NOTE — Progress Notes (Signed)
  Subjective:    Patient ID: Andrea [name], female    DOB: 08/03/1950, 62 y.o.   MRN: [id number]  HPI See previous note from today Pain Inventory Average Pain 5 Pain Right Now 5 My pain is sharp, burning, tingling and aching  In the last 24 hours, has pain interfered with the following? General activity 5 Relation with others 2 Enjoyment of life 4 What TIME of day is your pain at its worst? evening and night Sleep (in general) Fair  Pain is worse with: walking, bending, sitting and standing Pain improves with: rest, heat/ice and medication Relief from Meds: 4  Mobility use a cane how many minutes can you walk? 5 ability to climb steps?  yes do you drive?  yes  Function I need assistance with the following:  meal prep, household duties and shopping  Neuro/Psych weakness numbness tremor tingling trouble walking depression  Prior Studies Any changes since last visit?  no  Physicians involved in your care Any changes since last visit?  no   Family History  Problem Relation Age of Onset  . Cancer Mother   . Cancer Father    History   Social History  . Marital Status: Married    Spouse Name: N/A    Number of Children: N/A  . Years of Education: N/A   Social History Main Topics  . Smoking status: Current Every Day Smoker -- 0.50 packs/day for 45 years    Types: Cigarettes  . Smokeless tobacco: Never Used  . Alcohol Use: No  . Drug Use: No  . Sexual Activity: None   Other Topics Concern  . None   Social History Narrative  . None   Past Surgical History  Procedure Laterality Date  . Spine surgery    . Shoulder arthroscopy w/ rotator cuff repair Bilateral 4098,1191  . Carpal tunnel release    . Abdominal hysterectomy  1997  . Colonoscopy  2010    Dr. Servando Snare  . Mastectomy Right 2011  . Breast lumpectomy Right 2008  . Elbow surgery Left 2011  . Hip surgery Right 06/19/12   Past Medical History  Diagnosis Date  . Disorders of sacrum   . Cervical  post-laminectomy syndrome   . Cubital tunnel syndrome   . Unspecified musculoskeletal disorders and symptoms referable to neck     cervical/trapezius  . Lateral epicondylitis  of elbow   . Disturbance of skin sensation   . Late effects of acute poliomyelitis   . Hypertension   . Parkinson's disease 2011  . Peripheral vascular disease 2011    venous stasis   . Cancer [id number]      high-grade DCIS involving the right breast   BP 123/66  Pulse 69  Resp 14  Ht 5\' 1"  (1.549 m)  Wt 131 lb 6.4 oz (59.603 kg)  BMI 24.84 kg/m2  SpO2 98%    Review of Systems  Musculoskeletal: Positive for gait problem.  Neurological: Positive for tremors, weakness and numbness.       Tingling  Psychiatric/Behavioral: Positive for dysphoric mood.  All other systems reviewed and are negative.       Objective:   Physical Exam        Assessment & Plan:  See previous note from today

## 2013-02-19 NOTE — Progress Notes (Signed)
  Subjective:    Patient ID: Andrea [name], female    DOB: April 29, 1951, 62 y.o.   MRN: [id number]  HPI Right L4 medial branch and L5 dorsal ramus lumbosacral and S1 S2-S3 dorsal ramus radio frequency ablation Pain relief 75-80% in Right buttocks are Now noticing pain in bilateral low back as well as mid back area  Saw orthopedist, place in back support and put on prednisone 12 days Pain is exacerbated by bending foward during housework Pain is also exacerbated by standing No falls, no trauma  Tylenol #4 taking qid Review of Systems     Objective:   Physical Exam  Nursing note and vitals reviewed. Constitutional: She is oriented to person, place, and time. She appears well-developed and well-nourished.  HENT:  Head: Atraumatic.  Eyes: Conjunctivae and EOM are normal. Pupils are equal, round, and reactive to light.  Neurological: She is alert and oriented to person, place, and time.  Psychiatric: She has a normal mood and affect.   right PSIS no tenderness Left PSIS moderate tenderness Mild tenderness lumbar paraspinals L2-L5 left greater than right Left quad 1/5 right quad 4+/5, hip flexor left 3+/5, hip flexor  right 4+ Left ankle dorsiflexor 0 Right ankle dorsiflexor 4+/5 Sensory intact lower limbs       Assessment & Plan:  1. 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 We discussed that duration of affect is typically 6-12 months. Her last RF was helpful only for 1 month. She is now 2 months post the most recent radiofrequency procedure 2. Low back and left sided buttock pain. Exam is most consistent with sacroiliac disorder on the left side. 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 Would be at risk for SI pain. Will do diagnostic/therapeutic injection next month.  If the SI joint injection is not helpful, would consider bilateral medial branch blocks, L3-L4 as well as L5 dorsal ramus  injection.  Continue Tylenol #4, 1 per os 4 times a day #120 no refills

## 2013-02-19 NOTE — Patient Instructions (Signed)
We discussed that there may be several causes of pain for the mid back and low back area  Since the low back is most painful, will do left SI injection, if that is not helpful consider facet joint injection

## 2013-03-08 ENCOUNTER — Other Ambulatory Visit: Payer: Self-pay

## 2013-03-22 ENCOUNTER — Encounter: Payer: Medicare Other | Attending: Physical Medicine & Rehabilitation

## 2013-03-22 ENCOUNTER — Encounter: Payer: Self-pay | Admitting: Physical Medicine & Rehabilitation

## 2013-03-22 ENCOUNTER — Ambulatory Visit (HOSPITAL_BASED_OUTPATIENT_CLINIC_OR_DEPARTMENT_OTHER): Payer: Medicare Other | Admitting: Physical Medicine & Rehabilitation

## 2013-03-22 VITALS — BP 111/54 | HR 60 | Resp 14 | Ht 61.0 in | Wt 125.0 lb

## 2013-03-22 DIAGNOSIS — R209 Unspecified disturbances of skin sensation: Secondary | ICD-10-CM | POA: Insufficient documentation

## 2013-03-22 DIAGNOSIS — M533 Sacrococcygeal disorders, not elsewhere classified: Secondary | ICD-10-CM | POA: Insufficient documentation

## 2013-03-22 DIAGNOSIS — Z5181 Encounter for therapeutic drug level monitoring: Secondary | ICD-10-CM

## 2013-03-22 DIAGNOSIS — M76899 Other specified enthesopathies of unspecified lower limb, excluding foot: Secondary | ICD-10-CM | POA: Insufficient documentation

## 2013-03-22 DIAGNOSIS — G894 Chronic pain syndrome: Secondary | ICD-10-CM

## 2013-03-22 DIAGNOSIS — M7520 Bicipital tendinitis, unspecified shoulder: Secondary | ICD-10-CM | POA: Insufficient documentation

## 2013-03-22 DIAGNOSIS — Z79899 Other long term (current) drug therapy: Secondary | ICD-10-CM

## 2013-03-22 MED ORDER — ACETAMINOPHEN-CODEINE #4 300-60 MG PO TABS: 2.0000 | ORAL_TABLET | Freq: Four times a day (QID) | ORAL | Status: DC | PRN

## 2013-03-22 MED ORDER — ACETAMINOPHEN-CODEINE #4 300-60 MG PO TABS: 1.0000 | ORAL_TABLET | Freq: Four times a day (QID) | ORAL | Status: DC | PRN

## 2013-03-22 NOTE — Patient Instructions (Signed)
Call Dr Yevette Edwards if you wish to undergo Sacroiliac fusion

## 2013-03-22 NOTE — Progress Notes (Signed)

## 2013-03-22 NOTE — Progress Notes (Signed)
  PROCEDURE RECORD [facility]   Name: EMON MIGGINS DOB:1950-07-31 MRN: [id number]  Date:03/22/2013  Physician: Claudette Laws, MD    Nurse/CMA: Anastyn Ayars RN  Allergies:  Allergies  Allergen Reactions  . Lisinopril     Raises BP    Consent Signed: yes  Is patient diabetic? no  CBG today?   Pregnant: no LMP: No LMP recorded. Patient has had a hysterectomy. (age 62-55)  Anticoagulants: no Anti-inflammatory: no Antibiotics: no  Procedure: right sacroiliac steroid injection Position: Prone Start Time: 9:20 End Time:9:23  Fluoro Time: 6 seconds  RN/CMA Designer, multimedia    Time 8:49 9:25    BP 111/54 130/62    Pulse 60 63    Respirations 14 14    O2 Sat 98 99    S/S 6 6    Pain Level 5/10 5/10     D/C home with Renae Fickle, [name] & O X 3, D/C instructions reviewed, and sits independently.

## 2013-04-02 ENCOUNTER — Other Ambulatory Visit: Payer: Self-pay

## 2013-04-02 MED ORDER — METHOCARBAMOL 500 MG PO TABS: ORAL_TABLET | ORAL | Status: DC

## 2013-04-03 ENCOUNTER — Ambulatory Visit (INDEPENDENT_AMBULATORY_CARE_PROVIDER_SITE_OTHER): Payer: Medicare Other | Admitting: Podiatry

## 2013-04-03 ENCOUNTER — Encounter: Payer: Self-pay | Admitting: Podiatry

## 2013-04-03 ENCOUNTER — Ambulatory Visit (INDEPENDENT_AMBULATORY_CARE_PROVIDER_SITE_OTHER): Payer: Medicare Other

## 2013-04-03 VITALS — BP 132/70 | HR 66 | Resp 16 | Ht 61.0 in | Wt 125.0 lb

## 2013-04-03 DIAGNOSIS — M79672 Pain in left foot: Secondary | ICD-10-CM

## 2013-04-03 DIAGNOSIS — M7750 Other enthesopathy of unspecified foot: Secondary | ICD-10-CM

## 2013-04-03 DIAGNOSIS — M2040 Other hammer toe(s) (acquired), unspecified foot: Secondary | ICD-10-CM

## 2013-04-03 DIAGNOSIS — L84 Corns and callosities: Secondary | ICD-10-CM

## 2013-04-03 DIAGNOSIS — M79609 Pain in unspecified limb: Secondary | ICD-10-CM

## 2013-04-03 NOTE — Progress Notes (Signed)
Subjective:     Patient ID: Andrea [name], female   DOB: 1950-11-08, 62 y.o.   MRN: [id number]  Foot Pain   patient presents stating I have had polio on my left side and my brace is not fitting perfectly and I am developing corns that are becoming painful. Also pain on the outside of my foot and time   Review of Systems  All other systems reviewed and are negative.       Objective:   Physical Exam  Nursing note and vitals reviewed. Constitutional: She is oriented to person, place, and time.  Cardiovascular: Intact distal pulses.   Musculoskeletal: Normal range of motion.  Neurological: She is oriented to person, place, and time.  Skin: Skin is dry.   neurovascular status intact with severe structural changes to the left foot secondary to post polio symptoms with contracture noted and inversion of the foot noted. Keratotic lesions fourth and fifth toes secondary to position of foot with inflammation on the lateral foot surface    Assessment:     Keratotic lesion secondary to digital structure and tendinitis noted left foot    Plan:     H&P and discussion about foot structure discussed. Debridement of lesions accomplished with padding and consideration for new brace which she is scheduled to have done reappoint her recheck as needed

## 2013-04-03 NOTE — Progress Notes (Signed)
   Subjective:    Patient ID: Andrea [name], female    DOB: 07/01/1950, 62 y.o.   MRN: [id number]  HPI Comments: N pain , achey dull pain  L left pinky toe and left #4 toe and lateral side of foot pain  D for awhile  O gradual  C got worse over the last month  A shoes  T corn remover   Foot Pain      Review of Systems     Objective:   Physical Exam        Assessment & Plan:

## 2013-04-18 ENCOUNTER — Other Ambulatory Visit: Payer: Self-pay | Admitting: Physical Medicine & Rehabilitation

## 2013-04-19 ENCOUNTER — Other Ambulatory Visit: Payer: Self-pay

## 2013-04-19 MED ORDER — ACETAMINOPHEN-CODEINE #4 300-60 MG PO TABS: 1.0000 | ORAL_TABLET | Freq: Four times a day (QID) | ORAL | Status: DC | PRN

## 2013-04-24 ENCOUNTER — Ambulatory Visit: Payer: Self-pay | Admitting: Specialist

## 2013-04-30 ENCOUNTER — Ambulatory Visit (HOSPITAL_BASED_OUTPATIENT_CLINIC_OR_DEPARTMENT_OTHER): Payer: Medicare Other | Admitting: Physical Medicine & Rehabilitation

## 2013-04-30 ENCOUNTER — Encounter: Payer: Self-pay | Admitting: Physical Medicine & Rehabilitation

## 2013-04-30 ENCOUNTER — Encounter: Payer: Medicare Other | Attending: Physical Medicine & Rehabilitation

## 2013-04-30 VITALS — BP 129/69 | HR 71 | Resp 16 | Ht 61.0 in | Wt 128.0 lb

## 2013-04-30 DIAGNOSIS — M533 Sacrococcygeal disorders, not elsewhere classified: Secondary | ICD-10-CM

## 2013-04-30 DIAGNOSIS — M5416 Radiculopathy, lumbar region: Secondary | ICD-10-CM | POA: Insufficient documentation

## 2013-04-30 DIAGNOSIS — Z79899 Other long term (current) drug therapy: Secondary | ICD-10-CM | POA: Insufficient documentation

## 2013-04-30 DIAGNOSIS — M47817 Spondylosis without myelopathy or radiculopathy, lumbosacral region: Secondary | ICD-10-CM | POA: Insufficient documentation

## 2013-04-30 DIAGNOSIS — G14 Postpolio syndrome: Secondary | ICD-10-CM

## 2013-04-30 DIAGNOSIS — M545 Low back pain, unspecified: Secondary | ICD-10-CM

## 2013-04-30 DIAGNOSIS — G8929 Other chronic pain: Secondary | ICD-10-CM

## 2013-04-30 DIAGNOSIS — B91 Sequelae of poliomyelitis: Secondary | ICD-10-CM

## 2013-04-30 DIAGNOSIS — IMO0002 Reserved for concepts with insufficient information to code with codable children: Secondary | ICD-10-CM

## 2013-04-30 DIAGNOSIS — M7520 Bicipital tendinitis, unspecified shoulder: Secondary | ICD-10-CM | POA: Insufficient documentation

## 2013-04-30 DIAGNOSIS — R209 Unspecified disturbances of skin sensation: Secondary | ICD-10-CM | POA: Insufficient documentation

## 2013-04-30 DIAGNOSIS — M76899 Other specified enthesopathies of unspecified lower limb, excluding foot: Secondary | ICD-10-CM | POA: Insufficient documentation

## 2013-04-30 NOTE — Progress Notes (Signed)
Subjective:    Patient ID: Andrea [name], female    DOB: April 16, 1951, 62 y.o.   MRN: [id number]  HPI Low back pain as well as pain going down into the right foot area. Has been evaluated by orthopedic surgery. Repeat MRI of the lumbar spine was ordered showing mild worsening of the L3-L4 facet arthrosis with a synovial cyst which may results in displacement of the right L4 nerve root. There was no foraminal stenosis. There was also unchanged facet arthrosis that was not remarked upon in previous MRI performed in January 2014. There was an MRI of the thoracic spine also performed 04/24/2013 showing no large thoracic discs. There was broad based protrusion and T9-T10 as well as some very small bulges at T1-T2, T2-T3 and T11-T12. We went over these results using the actual images from [facility]  Orthopedics recommended some type of epidural injection. Pain Inventory Average Pain 6 Pain Right Now 4 My pain is sharp, dull, tingling and aching  In the last 24 hours, has pain interfered with the following? General activity 5 Relation with others 2 Enjoyment of life 5 What TIME of day is your pain at its worst? evening Sleep (in general) Fair  Pain is worse with: walking, bending and standing Pain improves with: rest, heat/ice and medication Relief from Meds: 4  Mobility use a cane ability to climb steps?  yes do you drive?  yes use a wheelchair Do you have any goals in this area?  no  Function disabled: date disabled 2008 I need assistance with the following:  household duties and shopping Do you have any goals in this area?  no  Neuro/Psych weakness numbness tremor tingling trouble walking depression  Prior Studies CT/MRI  Physicians involved in your care Any changes since last visit?  no   Family History  Problem Relation Age of Onset  . Cancer Mother   . Cancer Father    History   Social History  . Marital Status: Married    Spouse Name: N/A    Number of Children:  N/A  . Years of Education: N/A   Social History Main Topics  . Smoking status: Current Every Day Smoker -- 0.50 packs/day for 45 years    Types: Cigarettes  . Smokeless tobacco: Never Used  . Alcohol Use: No  . Drug Use: No  . Sexual Activity: None   Other Topics Concern  . None   Social History Narrative  . None   Past Surgical History  Procedure Laterality Date  . Spine surgery    . Shoulder arthroscopy w/ rotator cuff repair Bilateral 4098,1191  . Carpal tunnel release    . Abdominal hysterectomy  1997  . Colonoscopy  2010    Dr. Servando Snare  . Mastectomy Right 2011  . Breast lumpectomy Right 2008  . Elbow surgery Left 2011  . Hip surgery Right 06/19/12   Past Medical History  Diagnosis Date  . Disorders of sacrum   . Cervical post-laminectomy syndrome   . Cubital tunnel syndrome   . Unspecified musculoskeletal disorders and symptoms referable to neck     cervical/trapezius  . Lateral epicondylitis  of elbow   . Disturbance of skin sensation   . Late effects of acute poliomyelitis   . Hypertension   . Parkinson's disease 2011  . Peripheral vascular disease 2011    venous stasis   . Cancer [id number]      high-grade DCIS involving the right breast   BP 129/69  Pulse  71  Resp 16  Ht 5\' 1"  (1.549 m)  Wt 128 lb (58.06 kg)  BMI 24.20 kg/m2  SpO2 95%     Review of Systems  Musculoskeletal: Positive for back pain and gait problem.  Neurological: Positive for tremors, weakness and numbness.  Psychiatric/Behavioral: Positive for dysphoric mood.  All other systems reviewed and are negative.       Objective:   Physical Exam  Deep tendon reflexes absent right lower extremity Motor strength is 5/5 in the right hip flexor knee extensor 4/5 in the right ankle dorsiflexor  There is reduced sensation in the right L4 dermatomal distribution.  Left lower extremity in KAFO quad strength is 1/5 on the left TA strength is 1/5 on the left  Back  mild tenderness to  palpation right lumbar paraspinal muscles from L4-S1      Assessment & Plan:  1. Right lower extremity numbness this corresponds to the L4 distribution seen on the MRI of the lumbar spine. Agree with orthopedics, we'll schedule for transforaminal right L4-L5 transforaminal epidural steroid injection under fluoroscopic guidance turning the right L4 nerve root.  Continue gabapentin 300 mg twice a day and 400 mg twice a day  If the patient has residual low back pain after words may consider medial branch blocks  Cont T#4 takes ~6 tabs per day

## 2013-04-30 NOTE — Patient Instructions (Signed)
Will do right L4-L5 transforaminal lumbar epidural steroid injection under fluoroscopic guidance next visit  This should help the pain shooting down into your right foot. It may help somewhat with a low back as well  If the low back and buttock pain continued to be an issue may consider right-sided L2-L3 L4-L5 facet injections  I also recommend pulling the right knee to the chest and holding it for 2 minutes twice a day, this is done in a lying down position

## 2013-05-03 HISTORY — PX: ELBOW SURGERY: SHX618

## 2013-05-16 ENCOUNTER — Encounter: Payer: Self-pay | Admitting: General Surgery

## 2013-05-17 ENCOUNTER — Other Ambulatory Visit: Payer: Self-pay | Admitting: Physical Medicine & Rehabilitation

## 2013-05-18 ENCOUNTER — Other Ambulatory Visit: Payer: Self-pay

## 2013-05-18 MED ORDER — ACETAMINOPHEN-CODEINE #4 300-60 MG PO TABS: 1.0000 | ORAL_TABLET | Freq: Four times a day (QID) | ORAL | Status: DC | PRN

## 2013-05-21 ENCOUNTER — Encounter: Payer: Self-pay | Admitting: Physical Medicine & Rehabilitation

## 2013-05-22 ENCOUNTER — Ambulatory Visit: Payer: Self-pay | Admitting: Specialist

## 2013-05-31 ENCOUNTER — Ambulatory Visit (HOSPITAL_BASED_OUTPATIENT_CLINIC_OR_DEPARTMENT_OTHER): Payer: Medicare Other | Admitting: Physical Medicine & Rehabilitation

## 2013-05-31 ENCOUNTER — Encounter: Payer: Medicare Other | Attending: Physical Medicine & Rehabilitation

## 2013-05-31 ENCOUNTER — Encounter: Payer: Self-pay | Admitting: Physical Medicine & Rehabilitation

## 2013-05-31 VITALS — BP 124/58 | HR 64 | Resp 14 | Ht 61.0 in | Wt 125.6 lb

## 2013-05-31 DIAGNOSIS — IMO0002 Reserved for concepts with insufficient information to code with codable children: Secondary | ICD-10-CM

## 2013-05-31 DIAGNOSIS — M7520 Bicipital tendinitis, unspecified shoulder: Secondary | ICD-10-CM | POA: Insufficient documentation

## 2013-05-31 DIAGNOSIS — M533 Sacrococcygeal disorders, not elsewhere classified: Secondary | ICD-10-CM | POA: Insufficient documentation

## 2013-05-31 DIAGNOSIS — M76899 Other specified enthesopathies of unspecified lower limb, excluding foot: Secondary | ICD-10-CM | POA: Insufficient documentation

## 2013-05-31 DIAGNOSIS — M5416 Radiculopathy, lumbar region: Secondary | ICD-10-CM

## 2013-05-31 DIAGNOSIS — Z79899 Other long term (current) drug therapy: Secondary | ICD-10-CM | POA: Insufficient documentation

## 2013-05-31 DIAGNOSIS — R209 Unspecified disturbances of skin sensation: Secondary | ICD-10-CM | POA: Insufficient documentation

## 2013-05-31 NOTE — Progress Notes (Signed)
Lumbar R L4-5 transforaminal epidural steroid injection under fluoroscopic guidance  Indication: Lumbosacral radiculitis is not relieved by medication management or other conservative care and interfering with self-care and mobility.   Informed consent was obtained after describing risk and benefits of the procedure with the patient, this includes bleeding, bruising, infection, paralysis and medication side effects.  The patient wishes to proceed and has given written consent.  Patient was placed in prone position.  The lumbar area was marked and prepped with Betadine.  It was entered with a 25-gauge 1-1/2 inch needle and one mL of 1% lidocaine was injected into the skin and subcutaneous tissue.  Then a 22-gauge 3.5 inch spinal needle was inserted into the Right L4-5 intervertebral foramen under AP, lateral, and oblique view.  Then a solution containing one mL of 10 mg per mL dexamethasone and 2 mL of 1% lidocaine was injected.  The patient tolerated procedure well.  Post procedure instructions were given.  Please see post procedure form.

## 2013-05-31 NOTE — Patient Instructions (Signed)

## 2013-05-31 NOTE — Progress Notes (Signed)
  PROCEDURE RECORD [facility]   Name: MARICIA SCOTTI DOB:17-Apr-1951 MRN: [id number]  Date:05/31/2013  Physician: Alysia Penna, MD    Nurse/CMA: Vonna Drafts CMA  Allergies:  Allergies  Allergen Reactions  . Lisinopril     Raises BP    Consent Signed: yes  Is patient diabetic? no  CBG today?   Pregnant: no LMP: No LMP recorded. Patient has had a hysterectomy. (age 63-55)  Anticoagulants: no Anti-inflammatory: no Antibiotics: no  Procedure: Right L4-5 Lumbar epidural steroid injection Position: Prone Start Time: 1134 End Time: 1142 Fluoro Time: 18  RN/CMA Shumaker RN Levens CMA    Time 10:38 1146    BP 124/58 134/58    Pulse 64 61    Respirations 14 14    O2 Sat 94 97    S/S 6 6    Pain Level 3/10 0/10     D/C home with Eddie Dibbles (husband), [name] & O X 3, D/C instructions reviewed, and sits independently.

## 2013-06-04 ENCOUNTER — Other Ambulatory Visit: Payer: Self-pay | Admitting: Physical Medicine & Rehabilitation

## 2013-06-04 NOTE — Telephone Encounter (Signed)
Gabapentin refilled per electronic pharmacy request.

## 2013-06-11 ENCOUNTER — Other Ambulatory Visit: Payer: Self-pay

## 2013-06-11 MED ORDER — DICLOFENAC SODIUM 1 % TD GEL: 2.0000 g | Freq: Four times a day (QID) | TRANSDERMAL | Status: DC

## 2013-06-15 ENCOUNTER — Other Ambulatory Visit: Payer: Self-pay

## 2013-06-15 MED ORDER — DICLOFENAC SODIUM 1 % TD GEL: 2.0000 g | Freq: Four times a day (QID) | TRANSDERMAL | Status: DC

## 2013-06-20 ENCOUNTER — Other Ambulatory Visit: Payer: Self-pay

## 2013-06-20 ENCOUNTER — Other Ambulatory Visit: Payer: Self-pay | Admitting: Physical Medicine & Rehabilitation

## 2013-06-20 MED ORDER — ACETAMINOPHEN-CODEINE #4 300-60 MG PO TABS: 1.0000 | ORAL_TABLET | Freq: Four times a day (QID) | ORAL | Status: DC | PRN

## 2013-06-20 NOTE — Telephone Encounter (Signed)
Called in Tylenol #4 to pharmacy. Patient is aware that RX has been called in and that per Dr. Letta Pate it will be her last prescription since she is changing clinics.

## 2013-06-20 NOTE — Telephone Encounter (Signed)
Pt is switching to pain management closer to home may call in last Rx for T#4 Please remind pt of this

## 2013-07-11 ENCOUNTER — Ambulatory Visit: Payer: Self-pay | Admitting: General Surgery

## 2013-07-12 ENCOUNTER — Encounter: Payer: Self-pay | Admitting: General Surgery

## 2013-07-19 ENCOUNTER — Ambulatory Visit: Payer: Self-pay | Admitting: Pain Medicine

## 2013-07-30 ENCOUNTER — Ambulatory Visit: Payer: Self-pay | Admitting: Pain Medicine

## 2013-07-31 ENCOUNTER — Encounter: Payer: Self-pay | Admitting: General Surgery

## 2013-07-31 ENCOUNTER — Ambulatory Visit (INDEPENDENT_AMBULATORY_CARE_PROVIDER_SITE_OTHER): Payer: Medicare Other | Admitting: General Surgery

## 2013-07-31 VITALS — BP 112/70 | HR 74 | Resp 16 | Ht 61.0 in | Wt 129.0 lb

## 2013-07-31 DIAGNOSIS — Z853 Personal history of malignant neoplasm of breast: Secondary | ICD-10-CM

## 2013-07-31 DIAGNOSIS — D0590 Unspecified type of carcinoma in situ of unspecified breast: Secondary | ICD-10-CM

## 2013-07-31 DIAGNOSIS — D0510 Intraductal carcinoma in situ of unspecified breast: Secondary | ICD-10-CM

## 2013-07-31 NOTE — Progress Notes (Addendum)
Patient ID: Andrea [name], female   DOB: Jan 29, 1951, 63 y.o.   MRN: [id number]  Chief Complaint  Patient presents with  . Follow-up    left screening mammogram. History of breast cancer    HPI Andrea [name] is a 63 y.o. female who presents for a breast cancer evaluation. The most recent mammogram was done on 07/11/13. Patient does perform regular self breast checks and gets regular mammograms done.  The patient states she is having right chest wall pain that started approximately 4 months ago. She states the pain is in different locations in the right chest wall. The pain is described as a stabbing pain. The pain comes and goes and last only a few minutes. The pain is present on a weekly basis.The pain does not radiate to any other place. She hasn't tried anything to help alleviate the pain. She also mentions a finding a lump approximately 1 month ago that is located in the right lower central quadrant of the right chest wall. This area is tender.   The patient reports that when she first experienced a stabbing pain in the right chest she did see her primary care provider and reported ECG completed at that time was negative. She has not appreciated that the pain is brought on by exertion.   HPI  Past Medical History  Diagnosis Date  . Disorders of sacrum   . Cervical post-laminectomy syndrome   . Cubital tunnel syndrome   . Unspecified musculoskeletal disorders and symptoms referable to neck     cervical/trapezius  . Lateral epicondylitis  of elbow   . Disturbance of skin sensation   . Late effects of acute poliomyelitis   . Hypertension   . Parkinson's disease 2011  . Peripheral vascular disease 2011    venous stasis   . Cancer [id number]      High-grade DCIS,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    Past Surgical History  Procedure Laterality Date  . Spine surgery    . Shoulder arthroscopy w/ rotator cuff repair Bilateral 7616,0737  . Carpal tunnel release    . Abdominal  hysterectomy  1997  . Colonoscopy  2010    Dr. Allen Norris  . Mastectomy Right 2011  . Elbow surgery Left 2011  . Hip surgery Right 06/19/12  . Shoulder surgery Left 2014  . Elbow surgery Left 2015  . Breast lumpectomy Right 2008    wide excision, whole breast radiation  . Breast surgery Right December 04, 2009    Right simple mastectomy, sentinel node biopsy.    Family History  Problem Relation Age of Onset  . Cancer Mother   . Cancer Father     Social History History  Substance Use Topics  . Smoking status: Current Every Day Smoker -- 0.50 packs/day for 45 years    Types: Cigarettes  . Smokeless tobacco: Never Used  . Alcohol Use: Yes    Allergies  Allergen Reactions  . Lisinopril     Raises BP    Current Outpatient Prescriptions  Medication Sig Dispense Refill  . acetaminophen-codeine (TYLENOL #4) 300-60 MG per tablet TAKE 1 TO 2 TABLETS BY MOUTH EVERY 6 HOURS AS NEEDED  180 tablet  0  . amLODipine (NORVASC) 10 MG tablet Take 5 mg by mouth daily.       Marland Kitchen atenolol (TENORMIN) 25 MG tablet Take 25 mg by mouth daily.      . cefUROXime (CEFTIN) 250 MG tablet       .  cholecalciferol (VITAMIN D) 1000 UNITS tablet Take 2,000 Units by mouth daily.      . cloNIDine (CATAPRES) 0.1 MG tablet Take 1 tablet by mouth at bedtime.      . gabapentin (NEURONTIN) 100 MG capsule Take 100 mg by mouth 2 (two) times daily.      Marland Kitchen gabapentin (NEURONTIN) 300 MG capsule TAKE ONE CAPSULE BY MOUTH TWICE DAILY AND 2 CAPSULES BY MOUTH EVERY NIGHT AT BEDTIME  120 capsule  0  . lidocaine (LIDODERM) 5 % Place 1 patch onto the skin daily. Remove & Discard patch within 12 hours or as directed by MD      . losartan (COZAAR) 50 MG tablet Take 50 mg by mouth daily.      . meloxicam (MOBIC) 15 MG tablet Take 15 mg by mouth daily.      . methocarbamol (ROBAXIN) 500 MG tablet TAKE 1 TABLET BY MOUTH THREE TIMES DAILY.  Must last 30 days.  90 tablet  3  . omeprazole (PRILOSEC) 40 MG capsule Take 1 capsule by mouth  Daily.      Marland Kitchen PARoxetine (PAXIL) 20 MG tablet Take 1 tablet by mouth daily.      . polyethylene glycol (MIRALAX / GLYCOLAX) packet Take 17 g by mouth as needed.      . pravastatin (PRAVACHOL) 40 MG tablet Take 40 mg by mouth daily.      Marland Kitchen zolpidem (AMBIEN) 5 MG tablet Take 5 mg by mouth at bedtime as needed.        No current facility-administered medications for this visit.    Review of Systems Review of Systems  Constitutional: Negative.   Respiratory: Negative.   Cardiovascular: Negative.     Blood pressure 112/70, pulse 74, resp. rate 16, height 5\' 1"  (1.549 m), weight 129 lb (58.514 kg).  Physical Exam Physical Exam  Constitutional: She is oriented to person, place, and time. She appears well-developed and well-nourished.  Neck: Neck supple. No thyromegaly present.  Cardiovascular: Normal rate, regular rhythm and normal heart sounds.   No murmur heard. Pulmonary/Chest: Effort normal and breath sounds normal. Left breast exhibits no inverted nipple, no mass, no nipple discharge, no skin change and no tenderness.  Prolonged expiration.   Right mastectomy incision site is well healed.  Right chest wall skin cyst 4 mm.  The area of patient concern in the lateral aspect of the incision was negative on examination. She was asked to pinpoint the area where she felt the nodularity both while supine and seated, and could not locate any specific area of concern today. There's a very small dermal cyst less than 4 mm in diameter superior to the incision in the midportion of the wound.  Lymphadenopathy:    She has no cervical adenopathy.    She has no axillary adenopathy.  Neurological: She is alert and oriented to person, place, and time.  Skin: Skin is warm and dry.    Data Reviewed Left mammogram dated July 11, 2013 was reviewed. No interval change. BI-RAD-1.  Assessment    No evidence of recurrent chest wall disease 3.5 years status post salvage mastectomy.     Plan    I  don't have a good explanation for the intermittent discomfort the patient is experiencing the chest wall. It could be late effects of costochondritis radiation, but palpation over the costochondral cartilages does not reproduce her pain. Observation is warranted.  She had an ER/PR-negative tumor. No contraindication to resumption of estradiol therapy if indicated.  PCP: Gayland Curry MD   Robert Bellow 08/01/2013, 6:13 AM

## 2013-07-31 NOTE — Patient Instructions (Signed)
Patient to return in 1 year with a left breast screening mammogram . Continue self breast exams. Call office for any new breast issues or concerns.

## 2013-08-01 ENCOUNTER — Encounter: Payer: Self-pay | Admitting: General Surgery

## 2013-08-01 ENCOUNTER — Telehealth: Payer: Self-pay | Admitting: *Deleted

## 2013-08-01 DIAGNOSIS — D0510 Intraductal carcinoma in situ of unspecified breast: Secondary | ICD-10-CM | POA: Insufficient documentation

## 2013-08-01 NOTE — Telephone Encounter (Signed)
Notified patient as instructed, patient pleased °

## 2013-08-01 NOTE — Telephone Encounter (Signed)
Message copied by Carson Myrtle on Wed Aug 01, 2013  8:21 AM ------      Message from: [place], [place]      Created: Wed Aug 01, 2013  6:17 AM       Please notify the patient she can resume estradiol therapy with her PCP. ------

## 2013-08-28 ENCOUNTER — Ambulatory Visit: Payer: Self-pay | Admitting: Pain Medicine

## 2013-09-03 ENCOUNTER — Ambulatory Visit: Payer: Self-pay | Admitting: Specialist

## 2013-09-12 ENCOUNTER — Ambulatory Visit: Payer: Self-pay | Admitting: Pain Medicine

## 2013-09-26 ENCOUNTER — Ambulatory Visit: Payer: Self-pay | Admitting: Pain Medicine

## 2013-10-10 ENCOUNTER — Ambulatory Visit: Payer: Self-pay | Admitting: Pain Medicine

## 2013-10-25 ENCOUNTER — Ambulatory Visit: Payer: Self-pay | Admitting: Pain Medicine

## 2013-10-29 ENCOUNTER — Ambulatory Visit: Payer: Self-pay | Admitting: Unknown Physician Specialty

## 2013-10-29 LAB — KOH PREP

## 2013-10-31 LAB — PATHOLOGY REPORT

## 2013-11-22 ENCOUNTER — Ambulatory Visit: Payer: Self-pay | Admitting: Pain Medicine

## 2013-12-03 ENCOUNTER — Ambulatory Visit: Payer: Self-pay | Admitting: Pain Medicine

## 2013-12-20 ENCOUNTER — Ambulatory Visit: Payer: Self-pay | Admitting: Pain Medicine

## 2014-01-02 ENCOUNTER — Ambulatory Visit: Payer: Self-pay | Admitting: Pain Medicine

## 2014-01-22 ENCOUNTER — Ambulatory Visit: Payer: Self-pay | Admitting: Pain Medicine

## 2014-01-30 ENCOUNTER — Ambulatory Visit: Payer: Self-pay | Admitting: Pain Medicine

## 2014-02-06 ENCOUNTER — Ambulatory Visit: Payer: Self-pay | Admitting: Pain Medicine

## 2014-02-26 ENCOUNTER — Ambulatory Visit: Payer: Self-pay | Admitting: Pain Medicine

## 2014-03-04 ENCOUNTER — Encounter: Payer: Self-pay | Admitting: General Surgery

## 2014-07-15 ENCOUNTER — Ambulatory Visit: Payer: Self-pay | Admitting: General Surgery

## 2014-07-15 ENCOUNTER — Encounter: Payer: Self-pay | Admitting: General Surgery

## 2014-07-25 ENCOUNTER — Ambulatory Visit (INDEPENDENT_AMBULATORY_CARE_PROVIDER_SITE_OTHER): Payer: Medicare Other | Admitting: General Surgery

## 2014-07-25 ENCOUNTER — Encounter: Payer: Self-pay | Admitting: General Surgery

## 2014-07-25 VITALS — BP 120/64 | HR 68 | Resp 14 | Ht 61.0 in | Wt 112.0 lb

## 2014-07-25 DIAGNOSIS — Z853 Personal history of malignant neoplasm of breast: Secondary | ICD-10-CM

## 2014-07-25 NOTE — Patient Instructions (Signed)
Patient will be asked to return to the office in one year with a left screening mammogram.

## 2014-07-25 NOTE — Progress Notes (Signed)
Patient ID: Andrea [name], female   DOB: [birth year], 64 y.o.   MRN: [id number]  Chief Complaint  Patient presents with  . Follow-up    mammogram    HPI Andrea [name] is a 64 y.o. female who presents for a breast evaluation. The most recent left breast mammogram was done on 07/18/14 .  Patient does perform regular self breast checks and gets regular mammograms done.    HPI  Past Medical History  Diagnosis Date  . Disorders of sacrum   . Cervical post-laminectomy syndrome   . Cubital tunnel syndrome   . Unspecified musculoskeletal disorders and symptoms referable to neck     cervical/trapezius  . Lateral epicondylitis  of elbow   . Disturbance of skin sensation   . Late effects of acute poliomyelitis   . Hypertension   . Parkinson's disease 2011  . Peripheral vascular disease 2011    venous stasis   . 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 Spine surgery    . Shoulder arthroscopy w/ rotator cuff repair Bilateral [id number]  . Carpal tunnel release    . Abdominal hysterectomy  1997  . Colonoscopy  [id number]    Dr. Allen Norris, Dr. Vira Agar  . Mastectomy Right 2011  . Elbow surgery Left 2011  . Hip surgery Right 06/19/12  . Shoulder surgery Left 2014  . Elbow surgery Left 2015  . Breast lumpectomy Right 2008    wide excision, whole breast radiation  . Breast surgery Right December 04, 2009    Right simple mastectomy, sentinel node biopsy.    Family History  Problem Relation Age of Onset  . Cancer Mother   . Cancer Father     Social History History  Substance Use Topics  . Smoking status: Current Every Day Smoker -- 0.50 packs/day for 45 years    Types: Cigarettes  . Smokeless tobacco: Never Used  . Alcohol Use: Yes    Allergies  Allergen Reactions  . Lisinopril     Raises BP    Current Outpatient Prescriptions  Medication Sig Dispense Refill  . acetaminophen-codeine  (TYLENOL #4) 300-60 MG per tablet TAKE 1 TO 2 TABLETS BY MOUTH EVERY 6 HOURS AS NEEDED 180 tablet 0  . amLODipine (NORVASC) 10 MG tablet Take 5 mg by mouth daily.     Marland Kitchen atenolol (TENORMIN) 25 MG tablet Take 25 mg by mouth daily.    . cefUROXime (CEFTIN) 250 MG tablet     . cholecalciferol (VITAMIN D) 1000 UNITS tablet Take 2,000 Units by mouth daily.    Marland Kitchen EST ESTROGENS-METHYLTEST DS 1.25-2.5 MG TABS Take by mouth.    Marland Kitchen HYDROcodone-acetaminophen (NORCO/VICODIN) 5-325 MG per tablet Take 1 tablet by mouth every 6 (six) hours as needed for moderate pain.    Marland Kitchen lidocaine (LIDODERM) 5 % Place 1 patch onto the skin daily. Remove & Discard patch within 12 hours or as directed by MD    . losartan (COZAAR) 50 MG tablet Take 50 mg by mouth daily.    Marland Kitchen omeprazole (PRILOSEC) 40 MG capsule Take 1 capsule by mouth Daily.    Marland Kitchen PARoxetine (PAXIL) 20 MG tablet Take 1 tablet by mouth daily.    . polyethylene glycol (MIRALAX / GLYCOLAX) packet Take 17 g by mouth as needed.    . pravastatin (PRAVACHOL) 40 MG tablet Take 40 mg by mouth daily.    Marland Kitchen  pregabalin (LYRICA) 50 MG capsule Take 50 mg by mouth 3 (three) times daily.    Marland Kitchen zolpidem (AMBIEN) 5 MG tablet Take 5 mg by mouth at bedtime as needed.      No current facility-administered medications for this visit.    Review of Systems Review of Systems  Constitutional: Negative.   Respiratory: Negative.   Cardiovascular: Negative.     Blood pressure 120/64, pulse 68, resp. rate 14, height 5\' 1"  (1.549 m), weight 112 lb (50.803 kg), SpO2 98 %.  Physical Exam Physical Exam  Constitutional: She is oriented to person, place, and time. She appears well-developed and well-nourished.  Eyes: Conjunctivae are normal. No scleral icterus.  Neck: Neck supple.  Cardiovascular: Normal rate, regular rhythm and normal heart sounds.   Pulmonary/Chest: Effort normal and breath sounds normal. Left breast exhibits no inverted nipple, no mass, no nipple discharge, no skin  change and no tenderness.  Right mastectomy well healed incision .  Diffuse rhonchi noted throughout all lung fields.  Abdominal: Soft. Bowel sounds are normal. There is no tenderness.  Lymphadenopathy:    She has no cervical adenopathy.  Neurological: She is alert and oriented to person, place, and time.  Skin: Skin is warm and dry.     Data Reviewed Screening left breast mammogram dated 07/15/2014 was reviewed. BI-RADS-1.   Assessment    Benign breast exam.  Diffuse pulmonary rhonchi with focal expiratory wheezing, asymptomatic.    Plan    Patient will be asked to return to the office in one year with a left screening mammogram.    PCP: Gayland Curry MD   Robert Bellow 07/26/2014, 9:53 AM

## 2014-07-26 DIAGNOSIS — Z853 Personal history of malignant neoplasm of breast: Secondary | ICD-10-CM | POA: Insufficient documentation

## 2014-08-05 DIAGNOSIS — R296 Repeated falls: Secondary | ICD-10-CM | POA: Insufficient documentation

## 2014-08-23 NOTE — Op Note (Signed)
PATIENT NAME:  Andrea [name], Andrea [name] MR#:  [id number] DATE OF BIRTH:  Nov 11, 1950  DATE OF PROCEDURE:  06/19/2012  PREOPERATIVE DIAGNOSIS:  Intractable greater trochanteric bursitis, right hip.  POSTOPERATIVE DIAGNOSIS:  Intractable greater trochanteric bursitis, right hip.   PROCEDURE:  Arthroscopic fascia lata release and debridement of the bursa, right hip.   SURGEON:  Christophe Louis, M.D.   ANESTHESIA:  General.   COMPLICATIONS:  None.   ESTIMATED BLOOD LOSS:  25 mL.   DESCRIPTION OF PROCEDURE:  After adequate induction of general anesthesia, the patient was turned over into the left lateral decubitus position and secured with the bean bag. The right hip and leg were thoroughly prepped with alcohol and ChloraPrep and draped in standard sterile fashion. The landmarks about the greater trochanter are palpated and marked.  Both a proximal and distal portal is made in the infiltrated with 0.25% Marcaine with epinephrine. The arthroscope is inserted into the distal portal and the full radial resector into the proximal portal. The subcutaneous tissue was debrided with the resector down to the fascia lata.  Under direct vision, the full radial resector and the 50-degree ArthroWand are used to incise the fascia lata longitudinally from about 1 inch distal to the vastus lateralis attachment to about 1 inch proximal to the tip of the greater trochanter.  Side incisions were then also made in the midportion of the fascia lata.  The full radial resector is then used to dbride the trochanteric bursa. The wound is thoroughly irrigated multiple times. Skin edges are closed with 5-0 nylon. A soft bulky dressing is applied. The patient is returned to the recovery room in satisfactory condition having tolerated the procedure quite well.    ____________________________ Lucas Mallow, MD ces:ct D: 06/19/2012 15:03:11 ET T: 06/20/2012 08:49:57 ET JOB#: [id number]  cc: Lucas Mallow, MD,  <Dictator> Lucas Mallow MD ELECTRONICALLY SIGNED 06/22/2012 13:58

## 2014-08-23 NOTE — Op Note (Signed)
PATIENT NAME:  Andrea [name], Andrea [name] MR#:  [id number] DATE OF BIRTH:  [birth year]  DATE OF PROCEDURE:  08/11/2012  PREOPERATIVE DIAGNOSES:   1.  Rotator cuff tear, left shoulder.  2.  Severe degenerative arthritis of acromioclavicular joint, left shoulder.   POSTOPERATIVE DIAGNOSES:  1.  Rotator cuff tear, left shoulder.  2.  Severe degenerative arthritis of acromioclavicular joint, left shoulder.   PROCEDURES: 1.  Repair of rotator cuff tear, left shoulder.  2.  Anterior acromioplasty.  3.  Excision of distal left clavicle.   SURGEON:  Christophe Louis, M.D.   ANESTHESIA: Supraclavicular block plus general.   ESTIMATED BLOOD LOSS: 150 mL.   DESCRIPTION OF PROCEDURE: One gram of Ancef was given intravenously prior to the procedure. Supraclavicular block on the left and then general anesthesia were induced. The patient was placed in the lawn chair position with a bolster beneath the left shoulder and secured with the bean bag. The left shoulder and arm were thoroughly prepped with alcohol and ChloraPrep and draped in standard sterile fashion. The line of the anterosuperior skin incision was infiltrated with 0.5% Marcaine with epinephrine. Incision is made and the dissection carried down to the muscle fascia. Periosteal elevator is used superiorly to dissect underneath the skin to the acromioclavicular joint. The fascia over the joint was incised longitudinally and cleared off with the periosteal elevator. The distal 1 cm of the clavicle was then excised using the rongeur. Wound is thoroughly irrigated multiple times and the fascia is closed with 3-0 Vicryl. The deltoid is then split longitudinally beginning at the anterior acromion and the dissection carried down to the rotator cuff tendon. There is seen to be an 8 mm tear in the supraspinatus tendon, which is full thickness. There is a large hypertrophic anterior spur present. Using the rongeur and the oscillating reamer anterior acromioplasty is  performed. The rotator cuff tear is then repaired with multiple 3-0 Vicryl-type sutures. Range of motion demonstrated excellent stability and no other pathology. The wound is thoroughly irrigated multiple times. Muscle is repaired back anatomically using 3-0 Vicryl. Subcutaneous tissue is closed with 3-0 Vicryl. Skin is closed with the skin stapler. The subacromial joint is then injected with 20 mL of Marcaine with epinephrine and 4 mg of morphine. A soft bulky dressing is applied along with a sling, and the patient is returned to the recovery room in satisfactory condition having tolerated the procedure quite well. ____________________________ Lucas Mallow, MD ces:sb D: 08/11/2012 11:15:16 ET T: 08/11/2012 11:36:14 ET JOB#: [id number]  cc: Lucas Mallow, MD, <Dictator> Lucas Mallow MD ELECTRONICALLY SIGNED 08/25/2012 8:33

## 2014-08-24 NOTE — Op Note (Signed)
PATIENT NAME:  Andrea [name], Andrea [name] MR#:  [id number] DATE OF BIRTH:  21-Apr-1951  DATE OF PROCEDURE:  05/22/2013  PREOPERATIVE DIAGNOSES: 1.  Ulnar neuropathy, left elbow.  2.  Subacromial bursitis and tendinitis, left shoulder.   POSTOPERATIVE DIAGNOSES:   1.  Ulnar neuropathy, left elbow.  2.  Subacromial bursitis and tendinitis, left shoulder.   PROCEDURES PERFORMED: 1.  Ulnar nerve release, left elbow, with anterior transposition.  2.  Cortisone injection, subacromial bursa, left shoulder.   SURGEON: Christophe Louis, M.D.   ANESTHESIA: General.   COMPLICATIONS: None.   TOURNIQUET TIME: Approximately 45 minutes.   DESCRIPTION OF PROCEDURE:  After adequate induction of general anesthesia, the left upper extremity to the shoulder is thoroughly prepped with alcohol and ChloraPrep and draped in standard sterile fashion. A sterile tourniquet is used about the upper arm. The extremity is wrapped out with the Esmarch bandage and pneumatic tourniquet elevated to 250 mmHg. Under loupe magnification, standard medial curved incision is made over the medial epicondyle area and the dissection carefully carried down to the ulnar nerve. The ulnar nerve is tagged with a large vessel loop drain, and then proximal and distal release is performed under standard technique using the loupe magnification. The nerve was then seen to be easily transferred anterior to the medial epicondyle. A flap of fascia is then made anteriorly, and the nerve is placed within the flap and sutured down with care that the nerve is completely free and not impinged by the flap. Flexion and extension demonstrates no area of impingement of the nerve by the flap or any other tissue. The wound is thoroughly irrigated multiple times. Two vessel loop drains are brought out through separate stab wound incisions. Skin edges are infiltrated with 0.5% plain Marcaine. Subcutaneous tissue is closed with 3-0 Vicryl. The skin is closed with the skin  stapler. A soft bulky dressing is applied along with a sling. Under sterile conditions also, subacromial bursa at the left shoulder is carefully injected with 5 mL of Marcaine and 2 mL of Celestone. Band-Aid dressing is applied. She is returned to the recovery room in satisfactory condition having tolerated the procedure quite well.     ____________________________ Lucas Mallow, MD ces:dmm D: 05/22/2013 10:27:38 ET T: 05/22/2013 12:58:54 ET JOB#: [id number]  cc: Lucas Mallow, MD, <Dictator> Lucas Mallow MD ELECTRONICALLY SIGNED 05/24/2013 20:23

## 2014-10-30 ENCOUNTER — Other Ambulatory Visit: Payer: Self-pay | Admitting: Family Medicine

## 2014-10-30 DIAGNOSIS — C50911 Malignant neoplasm of unspecified site of right female breast: Secondary | ICD-10-CM

## 2014-10-30 DIAGNOSIS — R634 Abnormal weight loss: Secondary | ICD-10-CM

## 2014-10-30 DIAGNOSIS — Z853 Personal history of malignant neoplasm of breast: Secondary | ICD-10-CM

## 2014-11-01 ENCOUNTER — Encounter
Admission: RE | Admit: 2014-11-01 | Discharge: 2014-11-01 | Disposition: A | Discharge status: Home / Self Care | Payer: Medicare Other | Source: Ambulatory Visit | Attending: Family Medicine | Admitting: Family Medicine

## 2014-11-01 ENCOUNTER — Ambulatory Visit: Payer: Medicare Other

## 2014-11-01 ENCOUNTER — Other Ambulatory Visit: Payer: Medicare Other

## 2014-11-01 DIAGNOSIS — Z853 Personal history of malignant neoplasm of breast: Secondary | ICD-10-CM | POA: Insufficient documentation

## 2014-11-01 DIAGNOSIS — R634 Abnormal weight loss: Secondary | ICD-10-CM | POA: Diagnosis not present

## 2014-11-01 DIAGNOSIS — C50911 Malignant neoplasm of unspecified site of right female breast: Secondary | ICD-10-CM | POA: Insufficient documentation

## 2014-11-01 LAB — GLUCOSE, CAPILLARY: Glucose-Capillary: 97 mg/dL (ref 65–99)

## 2014-11-01 MED ORDER — FLUDEOXYGLUCOSE F - 18 (FDG) INJECTION
12.7500 | Freq: Once | INTRAVENOUS | Status: AC | PRN
  Administered 2014-11-01: 12.75 via INTRAVENOUS

## 2015-04-04 ENCOUNTER — Other Ambulatory Visit: Payer: Self-pay | Admitting: Obstetrics and Gynecology

## 2015-04-07 ENCOUNTER — Other Ambulatory Visit: Payer: Self-pay

## 2015-04-07 MED ORDER — EST ESTROGENS-METHYLTEST DS 1.25-2.5 MG PO TABS: 1.2500 mg | ORAL_TABLET | Freq: Every day | ORAL | Status: DC

## 2015-04-07 NOTE — Telephone Encounter (Signed)
Pt aware rx left up front for p/u.  

## 2015-05-09 ENCOUNTER — Other Ambulatory Visit: Payer: Self-pay

## 2015-05-09 DIAGNOSIS — Z1231 Encounter for screening mammogram for malignant neoplasm of breast: Secondary | ICD-10-CM

## 2015-06-16 ENCOUNTER — Other Ambulatory Visit: Payer: Self-pay | Admitting: Family Medicine

## 2015-06-16 DIAGNOSIS — M810 Age-related osteoporosis without current pathological fracture: Secondary | ICD-10-CM

## 2015-07-04 ENCOUNTER — Other Ambulatory Visit: Payer: Self-pay | Admitting: Orthopedic Surgery

## 2015-07-04 DIAGNOSIS — M25512 Pain in left shoulder: Secondary | ICD-10-CM

## 2015-07-16 ENCOUNTER — Other Ambulatory Visit: Payer: Self-pay | Admitting: General Surgery

## 2015-07-16 ENCOUNTER — Ambulatory Visit
Admission: RE | Admit: 2015-07-16 | Discharge: 2015-07-16 | Disposition: A | Discharge status: Home / Self Care | Payer: Medicare Other | Source: Ambulatory Visit | Attending: General Surgery | Admitting: General Surgery

## 2015-07-16 ENCOUNTER — Ambulatory Visit
Admission: RE | Admit: 2015-07-16 | Discharge: 2015-07-16 | Disposition: A | Discharge status: Home / Self Care | Payer: Medicare Other | Source: Ambulatory Visit | Attending: Family Medicine | Admitting: Family Medicine

## 2015-07-16 DIAGNOSIS — Z13820 Encounter for screening for osteoporosis: Secondary | ICD-10-CM | POA: Insufficient documentation

## 2015-07-16 DIAGNOSIS — M810 Age-related osteoporosis without current pathological fracture: Secondary | ICD-10-CM | POA: Insufficient documentation

## 2015-07-16 DIAGNOSIS — Z1231 Encounter for screening mammogram for malignant neoplasm of breast: Secondary | ICD-10-CM | POA: Diagnosis not present

## 2015-07-16 HISTORY — DX: Malignant neoplasm of unspecified site of unspecified female breast: C50.919

## 2015-07-16 HISTORY — DX: Reserved for concepts with insufficient information to code with codable children: IMO0002

## 2015-07-22 ENCOUNTER — Ambulatory Visit (INDEPENDENT_AMBULATORY_CARE_PROVIDER_SITE_OTHER): Payer: Medicare Other | Admitting: General Surgery

## 2015-07-22 ENCOUNTER — Encounter: Payer: Self-pay | Admitting: General Surgery

## 2015-07-22 VITALS — BP 112/62 | HR 72 | Resp 14 | Ht 59.0 in | Wt 112.0 lb

## 2015-07-22 DIAGNOSIS — Z853 Personal history of malignant neoplasm of breast: Secondary | ICD-10-CM

## 2015-07-22 NOTE — Patient Instructions (Addendum)
The patient is aware to call back for any questions or concerns. Patient will be asked to return to the office in one year with a left screening mammogram. 

## 2015-07-22 NOTE — Progress Notes (Signed)
Patient ID: Andrea [name], female   DOB: 08-09-1950, 65 y.o.   MRN: [id number]  Chief Complaint  Patient presents with  . Follow-up    mammogram    HPI Andrea [name] is a 65 y.o. female.  who presents for a follow up breast cancer and breast evaluation. The most recent left breast mammogram was done on 07/16/15 .  Patient does perform regular self breast checks and gets regular mammograms done.  She does state that she has occasional right axillary pain that comes and goes but for the past 2-3 months it has gotten bothersome. The pain only last a few minutes and does not seem to be associated with activity.  I personally reviewed the patient's history.   HPI  Past Medical History  Diagnosis Date  . Disorders of sacrum   . Cervical post-laminectomy syndrome   . Cubital tunnel syndrome   . Unspecified musculoskeletal disorders and symptoms referable to neck     cervical/trapezius  . Lateral epicondylitis  of elbow   . Disturbance of skin sensation   . Late effects of acute poliomyelitis   . Hypertension   . Parkinson's disease ([place]) 2011  . Peripheral vascular disease ([place]) 2011    venous stasis   . 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 Breast cancer ([place]) 2008    RT LUMPECTOMY  . Breast cancer ([place]) 2011    RT MASTECTOMY  . Radiation 2008    BREAST CA    Past Surgical History  Procedure Laterality Date  . Spine surgery    . Shoulder arthroscopy w/ rotator cuff repair Bilateral [id number]  . Carpal tunnel release    . Abdominal hysterectomy  1997  . Colonoscopy  [id number]    Dr. Allen Norris, Dr. Vira Agar  . Elbow surgery Left 2011  . Hip surgery Right 06/19/12  . Shoulder surgery Left 2014  . Elbow surgery Left 2015  . Breast lumpectomy Right 2008    wide excision, whole breast radiation  . Breast surgery Right December 04, 2009    Right simple mastectomy, sentinel node biopsy.  . Mastectomy Right 2011    BREAST CA     Family History  Problem Relation Age of Onset  . Cancer Mother   . Cancer Father   . Breast cancer Neg Hx     Social History Social History  Substance Use Topics  . Smoking status: Current Every Day Smoker -- 0.50 packs/day for 45 years    Types: Cigarettes  . Smokeless tobacco: Never Used  . Alcohol Use: Yes    Allergies  Allergen Reactions  . Lisinopril     Raises BP    Current Outpatient Prescriptions  Medication Sig Dispense Refill  . amLODipine (NORVASC) 10 MG tablet Take 5 mg by mouth daily.     . cholecalciferol (VITAMIN D) 1000 UNITS tablet Take 2,000 Units by mouth daily.    Marland Kitchen EST ESTROGENS-METHYLTEST DS 1.25-2.5 MG TABS Take 1.25-2.5 mg by mouth daily. 30 each 6  . HYDROcodone-acetaminophen (NORCO/VICODIN) 5-325 MG per tablet Take 1 tablet by mouth every 6 (six) hours as needed for moderate pain.    Marland Kitchen lidocaine (LIDODERM) 5 % Place 1 patch onto the skin daily. Remove & Discard patch within 12 hours or as directed by MD    . losartan (COZAAR) 50 MG tablet Take 50 mg by mouth daily.    Marland Kitchen omeprazole (PRILOSEC) 40 MG capsule Take  1 capsule by mouth Daily.    . polyethylene glycol (MIRALAX / GLYCOLAX) packet Take 17 g by mouth as needed.    . pravastatin (PRAVACHOL) 40 MG tablet Take 40 mg by mouth daily.    . pregabalin (LYRICA) 50 MG capsule Take 50 mg by mouth 3 (three) times daily.    Marland Kitchen tiZANidine (ZANAFLEX) 4 MG tablet 3 (three) times daily.     Marland Kitchen zolpidem (AMBIEN) 5 MG tablet Take 5 mg by mouth at bedtime as needed.      No current facility-administered medications for this visit.    Review of Systems Review of Systems  Constitutional: Negative.   Respiratory: Negative.   Cardiovascular: Negative.     Blood pressure 112/62, pulse 72, resp. rate 14, height 4\' 11"  (1.499 m), weight 112 lb (50.803 kg).  Physical Exam Physical Exam  Constitutional: She is oriented to person, place, and time. She appears well-developed and well-nourished.  HENT:   Mouth/Throat: Oropharynx is clear and moist.  Eyes: Conjunctivae are normal. No scleral icterus.  Neck: Neck supple.  Cardiovascular: Normal rate, regular rhythm and normal heart sounds.   Pulmonary/Chest: Effort normal and breath sounds normal. Left breast exhibits no inverted nipple, no mass, no nipple discharge, no skin change and no tenderness.    Right mastectomy well healed incision.   Lymphadenopathy:    She has no cervical adenopathy.    She has no axillary adenopathy.  Neurological: She is alert and oriented to person, place, and time.  Skin: Skin is warm and dry.  Psychiatric: Her behavior is normal.    Data Reviewed Left breast mammogram dated 07/16/2015 was reviewed. No interval change. BI-RADS-1.  Assessment    Benign breast exam.  Likely chest wall pain.    Plan    With the short-lived nature of her discomfort, no indication for chronic analgesics or anti-inflammatories.    Patient will be asked to return to the office in one year with a left screening mammogram.    PCP:  Gayland Curry This information has been scribed by Karie Fetch RNBC.   Robert Bellow 07/22/2015, 9:36 PM

## 2015-07-24 ENCOUNTER — Ambulatory Visit
Admission: RE | Admit: 2015-07-24 | Discharge: 2015-07-24 | Disposition: A | Discharge status: Home / Self Care | Payer: Medicare Other | Source: Ambulatory Visit | Attending: Orthopedic Surgery | Admitting: Orthopedic Surgery

## 2015-07-24 DIAGNOSIS — M75122 Complete rotator cuff tear or rupture of left shoulder, not specified as traumatic: Secondary | ICD-10-CM | POA: Insufficient documentation

## 2015-07-24 DIAGNOSIS — M25412 Effusion, left shoulder: Secondary | ICD-10-CM | POA: Insufficient documentation

## 2015-07-24 DIAGNOSIS — M25512 Pain in left shoulder: Secondary | ICD-10-CM | POA: Insufficient documentation

## 2015-07-24 DIAGNOSIS — Z9889 Other specified postprocedural states: Secondary | ICD-10-CM | POA: Diagnosis not present

## 2015-08-01 ENCOUNTER — Other Ambulatory Visit: Payer: Self-pay | Admitting: Orthopedic Surgery

## 2015-08-12 ENCOUNTER — Encounter
Admission: RE | Admit: 2015-08-12 | Discharge: 2015-08-12 | Disposition: A | Discharge status: Home / Self Care | Payer: Medicare Other | Source: Ambulatory Visit | Attending: Orthopedic Surgery | Admitting: Orthopedic Surgery

## 2015-08-12 ENCOUNTER — Other Ambulatory Visit: Payer: Medicare Other

## 2015-08-12 DIAGNOSIS — Z01812 Encounter for preprocedural laboratory examination: Secondary | ICD-10-CM | POA: Diagnosis present

## 2015-08-12 HISTORY — DX: Adverse effect of unspecified anesthetic, initial encounter: T41.45XA

## 2015-08-12 HISTORY — DX: Other complications of anesthesia, initial encounter: T88.59XA

## 2015-08-12 LAB — CBC WITH DIFFERENTIAL/PLATELET
BASOS PCT: 1 %
Basophils Absolute: 0.1 10*3/uL (ref 0–0.1)
EOS ABS: 0.2 10*3/uL (ref 0–0.7)
EOS PCT: 4 %
HCT: 36.2 % (ref 35.0–47.0)
Hemoglobin: 12.2 g/dL (ref 12.0–16.0)
LYMPHS ABS: 2 10*3/uL (ref 1.0–3.6)
Lymphocytes Relative: 38 %
MCH: 28.6 pg (ref 26.0–34.0)
MCHC: 33.6 g/dL (ref 32.0–36.0)
MCV: 85 fL (ref 80.0–100.0)
Monocytes Absolute: 0.6 10*3/uL (ref 0.2–0.9)
Monocytes Relative: 11 %
NEUTROS PCT: 46 %
Neutro Abs: 2.4 10*3/uL (ref 1.4–6.5)
PLATELETS: 276 10*3/uL (ref 150–440)
RBC: 4.26 MIL/uL (ref 3.80–5.20)
RDW: 15.9 % — ABNORMAL HIGH (ref 11.5–14.5)
WBC: 5.2 10*3/uL (ref 3.6–11.0)

## 2015-08-12 LAB — PROTIME-INR
INR: 0.94
Prothrombin Time: 12.8 seconds (ref 11.4–15.0)

## 2015-08-12 LAB — APTT: APTT: 33 s (ref 24–36)

## 2015-08-12 NOTE — Pre-Procedure Instructions (Signed)
Noted Met B was done at pt's PCP on 07/31/15, called Dr. Harden Mo office at  [phone] regarding the need to do another Met B or can we use the met B that was done on 07/31/15.  MLA of [facility].  Met B was drawn, and held for Dr. Harden Mo office to call back.

## 2015-08-12 NOTE — Patient Instructions (Signed)
  Your procedure is scheduled [id number] August 19, 2015. Report to Same Day Surgery. To find out your arrival time please call [phone] between 1PM - 3PM on Monday August 18, 2015.  Remember: Instructions that are not followed completely may result in serious medical risk, up to and including death, or upon the discretion of your surgeon and anesthesiologist your surgery may need to be rescheduled.    _x___ 1. Do not eat food or drink liquids after midnight. No gum chewing or hard candies.     ____ 2. No Alcohol for 24 hours before or after surgery.   ____ 3. Bring all medications with you on the day of surgery if instructed.    __x__ 4. Notify your doctor if there is any change in your medical condition     (cold, fever, infections).     Do not wear jewelry, make-up, hairpins, clips or nail polish.  Do not wear lotions, powders, or perfumes. You may wear deodorant.  Do not shave 48 hours prior to surgery. Men may shave face and neck.  Do not bring valuables to the hospital.    [facility] is not responsible for any belongings or valuables.               Contacts, dentures or bridgework may not be worn into surgery.  Leave your suitcase in the car. After surgery it may be brought to your room.  For patients admitted to the hospital, discharge time is determined by your treatment team.   Patients discharged the day of surgery will not be allowed to drive home.    Please read over the following fact sheets that you were given:   [facility] Preparing for Surgery  _x___ Take these medicines the morning of surgery with A SIP OF WATER:    1. amLODipine (NORVASC)  2. HYDROcodone-acetaminophen (NORCO) optional  3. losartan (COZAAR)  4. omeprazole (PRILOSEC)  5. pregabalin (LYRICA)    ____ Fleet Enema (as directed)   _x___ Use CHG Soap as directed on instruction sheet  ____ Use inhalers on the day of surgery and bring to hospital day of surgery  ____ Stop metformin 2 days  prior to surgery    ____ Take 1/2 of usual insulin dose the night before surgery and none on the morning of surgery.   ____ Stop Coumadin/Plavix/aspirin on does not take.  __x__ Stop Anti-inflammatories such as Advil, Aleve, Ibuprofen, Motrin, Naproxen, Naprosyn, Goodies powders or aspirin products.OK to take Hydrocodone.   __x__ Stop supplements Vitamin D until after surgery.    ____ Bring C-Pap to the hospital.

## 2015-08-13 NOTE — Pre-Procedure Instructions (Signed)
Spoke with Otis Peak at Dr. Harden Mo office yesterday around 4pm, Dr. Mack Guise ok's to use Met B done at PCP on 07/31/15 in place of pre-p ordered Met B.

## 2015-08-19 ENCOUNTER — Ambulatory Visit: Payer: Medicare Other | Admitting: Anesthesiology

## 2015-08-19 ENCOUNTER — Encounter
Admission: RE | Disposition: A | Discharge status: Home / Self Care | Payer: Self-pay | Source: Ambulatory Visit | Attending: Orthopedic Surgery

## 2015-08-19 ENCOUNTER — Ambulatory Visit
Admission: RE | Admit: 2015-08-19 | Discharge: 2015-08-19 | Disposition: A | Discharge status: Home / Self Care | Payer: Medicare Other | Source: Ambulatory Visit | Attending: Orthopedic Surgery | Admitting: Orthopedic Surgery

## 2015-08-19 ENCOUNTER — Encounter: Payer: Self-pay | Admitting: *Deleted

## 2015-08-19 DIAGNOSIS — S43432A Superior glenoid labrum lesion of left shoulder, initial encounter: Secondary | ICD-10-CM | POA: Insufficient documentation

## 2015-08-19 DIAGNOSIS — Z8261 Family history of arthritis: Secondary | ICD-10-CM | POA: Insufficient documentation

## 2015-08-19 DIAGNOSIS — K219 Gastro-esophageal reflux disease without esophagitis: Secondary | ICD-10-CM | POA: Insufficient documentation

## 2015-08-19 DIAGNOSIS — F329 Major depressive disorder, single episode, unspecified: Secondary | ICD-10-CM | POA: Diagnosis not present

## 2015-08-19 DIAGNOSIS — M7522 Bicipital tendinitis, left shoulder: Secondary | ICD-10-CM | POA: Diagnosis not present

## 2015-08-19 DIAGNOSIS — Z8249 Family history of ischemic heart disease and other diseases of the circulatory system: Secondary | ICD-10-CM | POA: Insufficient documentation

## 2015-08-19 DIAGNOSIS — Z801 Family history of malignant neoplasm of trachea, bronchus and lung: Secondary | ICD-10-CM | POA: Diagnosis not present

## 2015-08-19 DIAGNOSIS — M19079 Primary osteoarthritis, unspecified ankle and foot: Secondary | ICD-10-CM | POA: Diagnosis not present

## 2015-08-19 DIAGNOSIS — I10 Essential (primary) hypertension: Secondary | ICD-10-CM | POA: Insufficient documentation

## 2015-08-19 DIAGNOSIS — Z9010 Acquired absence of unspecified breast and nipple: Secondary | ICD-10-CM | POA: Diagnosis not present

## 2015-08-19 DIAGNOSIS — Z833 Family history of diabetes mellitus: Secondary | ICD-10-CM | POA: Insufficient documentation

## 2015-08-19 DIAGNOSIS — M810 Age-related osteoporosis without current pathological fracture: Secondary | ICD-10-CM | POA: Diagnosis not present

## 2015-08-19 DIAGNOSIS — Z823 Family history of stroke: Secondary | ICD-10-CM | POA: Insufficient documentation

## 2015-08-19 DIAGNOSIS — X58XXXA Exposure to other specified factors, initial encounter: Secondary | ICD-10-CM | POA: Diagnosis not present

## 2015-08-19 DIAGNOSIS — M7542 Impingement syndrome of left shoulder: Secondary | ICD-10-CM | POA: Insufficient documentation

## 2015-08-19 DIAGNOSIS — Z79899 Other long term (current) drug therapy: Secondary | ICD-10-CM | POA: Insufficient documentation

## 2015-08-19 DIAGNOSIS — E7800 Pure hypercholesterolemia, unspecified: Secondary | ICD-10-CM | POA: Insufficient documentation

## 2015-08-19 DIAGNOSIS — F17210 Nicotine dependence, cigarettes, uncomplicated: Secondary | ICD-10-CM | POA: Insufficient documentation

## 2015-08-19 DIAGNOSIS — Z79891 Long term (current) use of opiate analgesic: Secondary | ICD-10-CM | POA: Insufficient documentation

## 2015-08-19 DIAGNOSIS — M19012 Primary osteoarthritis, left shoulder: Secondary | ICD-10-CM | POA: Insufficient documentation

## 2015-08-19 DIAGNOSIS — M75112 Incomplete rotator cuff tear or rupture of left shoulder, not specified as traumatic: Secondary | ICD-10-CM | POA: Diagnosis not present

## 2015-08-19 DIAGNOSIS — Z9889 Other specified postprocedural states: Secondary | ICD-10-CM | POA: Diagnosis not present

## 2015-08-19 HISTORY — PX: SHOULDER ARTHROSCOPY WITH OPEN ROTATOR CUFF REPAIR: SHX6092

## 2015-08-19 SURGERY — ARTHROSCOPY, SHOULDER WITH REPAIR, ROTATOR CUFF, OPEN
Anesthesia: Regional | Site: Shoulder | Laterality: Left | Wound class: Clean

## 2015-08-19 MED ORDER — FENTANYL CITRATE (PF) 100 MCG/2ML IJ SOLN
INTRAMUSCULAR | Status: DC | PRN
  Administered 2015-08-19: 50 ug via INTRAVENOUS

## 2015-08-19 MED ORDER — SODIUM CHLORIDE 0.9 % IV SOLN: 10000.0000 ug | INTRAVENOUS | Status: DC | PRN

## 2015-08-19 MED ORDER — FENTANYL CITRATE (PF) 100 MCG/2ML IJ SOLN: 25.0000 ug | INTRAMUSCULAR | Status: DC | PRN

## 2015-08-19 MED ORDER — ROCURONIUM BROMIDE 100 MG/10ML IV SOLN
INTRAVENOUS | Status: DC | PRN
  Administered 2015-08-19: 40 mg via INTRAVENOUS
  Administered 2015-08-19: 10 mg via INTRAVENOUS

## 2015-08-19 MED ORDER — SUCCINYLCHOLINE CHLORIDE 20 MG/ML IJ SOLN
INTRAMUSCULAR | Status: DC | PRN
  Administered 2015-08-19: 60 mg via INTRAVENOUS

## 2015-08-19 MED ORDER — ROPIVACAINE HCL 5 MG/ML IJ SOLN
INTRAMUSCULAR | Status: AC
  Filled 2015-08-19: qty 20

## 2015-08-19 MED ORDER — CHLORHEXIDINE GLUCONATE 4 % EX LIQD: 1.0000 "application " | Freq: Once | CUTANEOUS | Status: DC

## 2015-08-19 MED ORDER — CEFAZOLIN SODIUM-DEXTROSE 2-4 GM/100ML-% IV SOLN
INTRAVENOUS | Status: AC
  Filled 2015-08-19: qty 100

## 2015-08-19 MED ORDER — ONDANSETRON HCL 4 MG/2ML IJ SOLN
INTRAMUSCULAR | Status: DC | PRN
  Administered 2015-08-19: 4 mg via INTRAVENOUS

## 2015-08-19 MED ORDER — DEXAMETHASONE SODIUM PHOSPHATE 10 MG/ML IJ SOLN
INTRAMUSCULAR | Status: DC | PRN
  Administered 2015-08-19: 5 mg via INTRAVENOUS

## 2015-08-19 MED ORDER — EPINEPHRINE HCL 1 MG/ML IJ SOLN
INTRAMUSCULAR | Status: DC | PRN
  Administered 2015-08-19: 10 mL

## 2015-08-19 MED ORDER — ONDANSETRON HCL 4 MG/2ML IJ SOLN: 4.0000 mg | Freq: Once | INTRAMUSCULAR | Status: DC | PRN

## 2015-08-19 MED ORDER — BUPIVACAINE HCL 0.25 % IJ SOLN
INTRAMUSCULAR | Status: DC | PRN
  Administered 2015-08-19: 30 mL

## 2015-08-19 MED ORDER — MIDAZOLAM HCL 2 MG/2ML IJ SOLN
INTRAMUSCULAR | Status: DC | PRN
  Administered 2015-08-19: 2 mg via INTRAVENOUS

## 2015-08-19 MED ORDER — LIDOCAINE HCL 1 % IJ SOLN
INTRAMUSCULAR | Status: DC | PRN
  Administered 2015-08-19: 10 mL

## 2015-08-19 MED ORDER — ONDANSETRON HCL 4 MG PO TABS: 4.0000 mg | ORAL_TABLET | Freq: Three times a day (TID) | ORAL | Status: DC | PRN

## 2015-08-19 MED ORDER — GLYCOPYRROLATE 0.2 MG/ML IJ SOLN
INTRAMUSCULAR | Status: DC | PRN
  Administered 2015-08-19: 0.2 mg via INTRAVENOUS

## 2015-08-19 MED ORDER — NEOMYCIN-POLYMYXIN B GU 40-200000 IR SOLN
Status: AC
  Filled 2015-08-19: qty 2

## 2015-08-19 MED ORDER — ACETAMINOPHEN 10 MG/ML IV SOLN
INTRAVENOUS | Status: DC | PRN
  Administered 2015-08-19: 1000 mg via INTRAVENOUS

## 2015-08-19 MED ORDER — LACTATED RINGERS IV SOLN
INTRAVENOUS | Status: DC
  Administered 2015-08-19: 07:00:00 via INTRAVENOUS

## 2015-08-19 MED ORDER — PROPOFOL 10 MG/ML IV BOLUS
INTRAVENOUS | Status: DC | PRN
Start: 2015-08-19 — End: 2015-08-19
  Administered 2015-08-19: 100 mg via INTRAVENOUS

## 2015-08-19 MED ORDER — LIDOCAINE HCL (PF) 1 % IJ SOLN
INTRAMUSCULAR | Status: AC
  Filled 2015-08-19: qty 30

## 2015-08-19 MED ORDER — CEFAZOLIN SODIUM-DEXTROSE 2-4 GM/100ML-% IV SOLN
2.0000 g | INTRAVENOUS | Status: AC
  Administered 2015-08-19: 2 g via INTRAVENOUS

## 2015-08-19 MED ORDER — ACETAMINOPHEN 10 MG/ML IV SOLN
INTRAVENOUS | Status: AC
  Filled 2015-08-19: qty 100

## 2015-08-19 MED ORDER — LIDOCAINE HCL (CARDIAC) 20 MG/ML IV SOLN
INTRAVENOUS | Status: DC | PRN
  Administered 2015-08-19: 60 mg via INTRAVENOUS

## 2015-08-19 MED ORDER — SODIUM CHLORIDE 0.9 % IV SOLN
10000.0000 ug | INTRAVENOUS | Status: DC | PRN
  Administered 2015-08-19: 25 ug/min via INTRAVENOUS
  Administered 2015-08-19: 30 ug/min via INTRAVENOUS

## 2015-08-19 MED ORDER — ROPIVACAINE HCL 2 MG/ML IJ SOLN
INTRAMUSCULAR | Status: AC
  Filled 2015-08-19: qty 20

## 2015-08-19 MED ORDER — NEOMYCIN-POLYMYXIN B GU 40-200000 IR SOLN
Status: DC | PRN
  Administered 2015-08-19: 2 mL

## 2015-08-19 MED ORDER — ROPIVACAINE HCL 5 MG/ML IJ SOLN
INTRAMUSCULAR | Status: DC | PRN
  Administered 2015-08-19: 30 mL via PERINEURAL

## 2015-08-19 MED ORDER — EPINEPHRINE HCL 1 MG/ML IJ SOLN
INTRAMUSCULAR | Status: AC
  Filled 2015-08-19: qty 1

## 2015-08-19 MED ORDER — SUGAMMADEX SODIUM 200 MG/2ML IV SOLN
INTRAVENOUS | Status: DC | PRN
  Administered 2015-08-19: 100 mg via INTRAVENOUS

## 2015-08-19 MED ORDER — OXYCODONE HCL 5 MG PO TABS: 5.0000 mg | ORAL_TABLET | ORAL | Status: DC | PRN

## 2015-08-19 MED ORDER — PHENYLEPHRINE HCL 10 MG/ML IJ SOLN
INTRAMUSCULAR | Status: DC | PRN
  Administered 2015-08-19 (×9): 100 ug via INTRAVENOUS

## 2015-08-19 MED ORDER — BUPIVACAINE HCL (PF) 0.25 % IJ SOLN
INTRAMUSCULAR | Status: AC
  Filled 2015-08-19: qty 30

## 2015-08-19 SURGICAL SUPPLY — 71 items
ADAPTER IRRIG TUBE 2 SPIKE SOL (ADAPTER) ×4 IMPLANT
ADPR TBG 2 SPK PMP STRL ASCP (ADAPTER) ×2
ANCH SUT Q-FX 2.8 (Anchor) ×1 IMPLANT
ANCHOR ALL-SUT Q-FIX 2.8 (Anchor) ×5 IMPLANT
BUR RADIUS 4.0X18.5 (BURR) ×2 IMPLANT
BUR RADIUS 5.5 (BURR) ×2 IMPLANT
CANISTER SUCT LVC 12 LTR MEDI- (MISCELLANEOUS) ×1 IMPLANT
CANNULA 5.75X7 CRYSTAL CLEAR (CANNULA) ×2 IMPLANT
CANNULA PARTIAL THREAD 2X7 (CANNULA) ×1 IMPLANT
CANNULA TWIST IN 8.25X9CM (CANNULA) ×2 IMPLANT
CONNECTOR PERFECT PASSER (CONNECTOR) ×3 IMPLANT
COOLER POLAR GLACIER W/PUMP (MISCELLANEOUS) ×2 IMPLANT
CRADLE LAMINECT ARM (MISCELLANEOUS) ×2 IMPLANT
DECANTER SPIKE VIAL GLASS SM (MISCELLANEOUS) ×2 IMPLANT
DRAPE IMP U-DRAPE 54X76 (DRAPES) ×4 IMPLANT
DRAPE INCISE IOBAN 66X45 STRL (DRAPES) ×2 IMPLANT
DRAPE SHEET LG 3/4 BI-LAMINATE (DRAPES) ×2 IMPLANT
DRAPE U-SHAPE 47X51 STRL (DRAPES) ×2 IMPLANT
DURAPREP 26ML APPLICATOR (WOUND CARE) ×6 IMPLANT
ELECT REM PT RETURN 9FT ADLT (ELECTROSURGICAL) ×2
ELECTRODE REM PT RTRN 9FT ADLT (ELECTROSURGICAL) ×1 IMPLANT
GAUZE PETRO XEROFOAM 1X8 (MISCELLANEOUS) ×2 IMPLANT
GAUZE SPONGE 4X4 12PLY STRL (GAUZE/BANDAGES/DRESSINGS) ×3 IMPLANT
GLOVE BIOGEL PI IND STRL 9 (GLOVE) ×1 IMPLANT
GLOVE BIOGEL PI INDICATOR 9 (GLOVE) ×1
GLOVE SURG 9.0 ORTHO LTXF (GLOVE) ×4 IMPLANT
GOWN STRL REUS TWL 2XL XL LVL4 (GOWN DISPOSABLE) ×2 IMPLANT
GOWN STRL REUS W/ TWL LRG LVL3 (GOWN DISPOSABLE) ×1 IMPLANT
GOWN STRL REUS W/ TWL LRG LVL4 (GOWN DISPOSABLE) ×1 IMPLANT
GOWN STRL REUS W/TWL LRG LVL3 (GOWN DISPOSABLE) ×2
GOWN STRL REUS W/TWL LRG LVL4 (GOWN DISPOSABLE) ×2
IV LACTATED RINGER IRRG 3000ML (IV SOLUTION) ×20
IV LR IRRIG 3000ML ARTHROMATIC (IV SOLUTION) ×6 IMPLANT
KIT RM TURNOVER STRD PROC AR (KITS) ×2 IMPLANT
KIT STABILIZATION SHOULDER (MISCELLANEOUS) ×3 IMPLANT
KIT SUTURE 2.8 Q-FIX DISP (MISCELLANEOUS) ×2 IMPLANT
KIT SUTURETAK 3.0 INSERT PERC (KITS) IMPLANT
MANIFOLD NEPTUNE II (INSTRUMENTS) ×2 IMPLANT
MASK FACE SPIDER DISP (MASK) ×2 IMPLANT
MAT BLUE FLOOR 46X72 FLO (MISCELLANEOUS) ×2 IMPLANT
NDL SAFETY 18GX1.5 (NEEDLE) ×2 IMPLANT
NDL SAFETY 22GX1.5 (NEEDLE) ×2 IMPLANT
NS IRRIG 500ML POUR BTL (IV SOLUTION) ×2 IMPLANT
PACK ARTHROSCOPY SHOULDER (MISCELLANEOUS) ×2 IMPLANT
PAD WRAPON POLAR SHDR UNIV (MISCELLANEOUS) ×1 IMPLANT
PASSER SUT CAPTURE FIRST (SUTURE) ×2 IMPLANT
SET TUBE SUCT SHAVER OUTFL 24K (TUBING) ×2 IMPLANT
SET TUBE TIP INTRA-ARTICULAR (MISCELLANEOUS) ×2 IMPLANT
STRAP SAFETY BODY (MISCELLANEOUS) ×2 IMPLANT
STRIP CLOSURE SKIN 1/2X4 (GAUZE/BANDAGES/DRESSINGS) ×2 IMPLANT
SUT ETHILON 4-0 (SUTURE) ×2
SUT ETHILON 4-0 FS2 18XMFL BLK (SUTURE) ×1
SUT KNTLS 2.8 MAGNUM (Anchor) ×7 IMPLANT
SUT LASSO 90 DEG SD STR (SUTURE) IMPLANT
SUT MNCRL 4-0 (SUTURE) ×2
SUT MNCRL 4-0 27XMFL (SUTURE) ×1
SUT PDS AB 0 CT1 27 (SUTURE) ×3 IMPLANT
SUT PERFECTPASSER WHITE CART (SUTURE) ×8 IMPLANT
SUT SMART STITCH CARTRIDGE (SUTURE) ×2 IMPLANT
SUT VIC AB 0 CT1 36 (SUTURE) ×4 IMPLANT
SUT VIC AB 2-0 CT2 27 (SUTURE) ×2 IMPLANT
SUTURE ETHLN 4-0 FS2 18XMF BLK (SUTURE) ×1 IMPLANT
SUTURE MAGNUM WIRE 2X48 BLK (SUTURE) ×2 IMPLANT
SUTURE MNCRL 4-0 27XMF (SUTURE) ×1 IMPLANT
SYRINGE 10CC LL (SYRINGE) ×2 IMPLANT
TAPE MICROFOAM 4IN (TAPE) ×2 IMPLANT
TRAY FOLEY CATH SILVER 16FR LF (SET/KITS/TRAYS/PACK) ×1 IMPLANT
TUBING ARTHRO INFLOW-ONLY STRL (TUBING) ×2 IMPLANT
TUBING CONNECTING 10 (TUBING) ×2 IMPLANT
WAND HAND CNTRL MULTIVAC 90 (MISCELLANEOUS) ×2 IMPLANT
WRAPON POLAR PAD SHDR UNIV (MISCELLANEOUS) ×2

## 2015-08-19 NOTE — Anesthesia Preprocedure Evaluation (Addendum)
Anesthesia Evaluation  Patient identified by MRN, date of birth, ID band Patient awake    Reviewed: Allergy & Precautions, H&P , NPO status , Patient's Chart, lab work & pertinent test results, reviewed documented beta blocker date and time   History of Anesthesia Complications (+) history of anesthetic complications  Airway Mallampati: III  TM Distance: >3 FB Neck ROM: full    Dental no notable dental hx. (+) Caps, Missing, Poor Dentition   Pulmonary neg shortness of breath, neg sleep apnea, neg COPD, neg recent URI, Current Smoker,    Pulmonary exam normal breath sounds clear to auscultation       Cardiovascular Exercise Tolerance: Good hypertension, On Medications (-) angina(-) CAD, (-) Past MI, (-) Cardiac Stents and (-) CABG Normal cardiovascular exam(-) dysrhythmias (-) Valvular Problems/Murmurs Rhythm:regular Rate:Normal     Neuro/Psych neg Seizures  Neuromuscular disease negative psych ROS   GI/Hepatic Neg liver ROS, GERD  ,  Endo/Other  negative endocrine ROS  Renal/GU negative Renal ROS  negative genitourinary   Musculoskeletal   Abdominal   Peds  Hematology negative hematology ROS (+)   Anesthesia Other Findings Past Medical History:   Disorders of sacrum                                          Cervical post-laminectomy syndrome                           Cubital tunnel syndrome                                      Unspecified musculoskeletal disorders and symp*                Comment:cervical/trapezius   Lateral epicondylitis  of elbow                              Disturbance of skin sensation                                Late effects of acute poliomyelitis                          Hypertension                                                 Parkinson's disease ([place])                       2011         Peripheral vascular disease ([place])               2011           Comment:venous stasis    Radiation                                        2008  Comment:BREAST CA   Complication of anesthesia                                     Comment:itching real bad or msucles spasms   Cancer ([place])                                    [id number]      Comment: High-grade DCIS,ER PR negative involving the               right breast   Breast cancer ([place])                             2008           Comment:RT LUMPECTOMY   Breast cancer ([place])                             2011           Comment:RT MASTECTOMY   Reproductive/Obstetrics negative OB ROS                            Anesthesia Physical Anesthesia Plan  ASA: III  Anesthesia Plan: General and Regional   Post-op Pain Management: GA combined w/ Regional for post-op pain   Induction:   Airway Management Planned:   Additional Equipment:   Intra-op Plan:   Post-operative Plan:   Informed Consent: I have reviewed the patients History and Physical, chart, labs and discussed the procedure including the risks, benefits and alternatives for the proposed anesthesia with the patient or authorized representative who has indicated his/her understanding and acceptance.   Dental Advisory Given  Plan Discussed with: Anesthesiologist, CRNA and Surgeon  Anesthesia Plan Comments:        Anesthesia Quick Evaluation

## 2015-08-19 NOTE — Anesthesia Procedure Notes (Addendum)
Procedure Name: Intubation Date/Time: 08/19/2015 7:59 AM Performed by: Aline Brochure Pre-anesthesia Checklist: Patient identified, Emergency Drugs available, Suction available and Patient being monitored Patient Re-evaluated:Patient Re-evaluated prior to inductionPreoxygenation: Pre-oxygenation with 100% oxygen Intubation Type: IV induction Ventilation: Mask ventilation without difficulty Laryngoscope Size: McGraph and 3 Grade View: Grade I Tube type: Oral Tube size: 7.0 mm Number of attempts: 1 Airway Equipment and Method: Stylet and Video-laryngoscopy Placement Confirmation: ETT inserted through vocal cords under direct vision,  positive ETCO2 and breath sounds checked- equal and bilateral Secured at: 20 cm Tube secured with: Tape Dental Injury: Teeth and Oropharynx as per pre-operative assessment    Anesthesia Regional Block:  Interscalene brachial plexus block  Pre-Anesthetic Checklist: ,, timeout performed, Correct Patient, Correct Site, Correct Laterality, Correct Procedure, Correct Position, site marked, Risks and benefits discussed,  Surgical consent,  Pre-op evaluation,  At surgeon's request and post-op pain management  Laterality: Left and Upper  Prep: chloraprep       Needles:  Injection technique: Single-shot  Needle Type: Stimiplex     Needle Length: 10cm 10 cm Needle Gauge: 22 and 22 G    Additional Needles:  Procedures: ultrasound guided (picture in chart) Interscalene brachial plexus block Narrative:  Start time: 08/19/2015 7:45 AM End time: 08/19/2015 7:51 AM Injection made incrementally with aspirations every 5 mL.  Performed by: Personally  Anesthesiologist: Martha Clan  Additional Notes: Functioning IV was confirmed and monitors were applied.  A 69mm 22ga Stimuplex needle was used. Sterile prep and drape,hand hygiene and sterile gloves were used.  Negative aspiration and negative test dose prior to incremental administration of local  anesthetic. The patient tolerated the procedure well.

## 2015-08-19 NOTE — Op Note (Signed)
08/19/2015  11:08 AM  PATIENT:  Andrea [name]  65 y.o. female  PRE-OPERATIVE DIAGNOSIS:  High grade partial thickness tear of the supraspinatus and subacromial impingement, left shoulder  POST-OPERATIVE DIAGNOSIS:  High grade partial thicknesss tear of the supraspinatus, torn superior labrum, degenerative biceps tendon with tenodesis, subacromial spur, glenohumeral degenerative joint disease, left shoulder  PROCEDURE:  Procedure(s): LEFT SHOULDER ARTHROSCOPY WITH  MINI OPEN ROTATOR CUFF REPAIR AND BICEPS TENODESIS,SUBACROMIAL DECOMPRESSION AND DEBRIDEMENT OF GLENOID LABRUM  SURGEON:  Surgeon(s) and Role:    * Thornton Park, MD - Primary  ANESTHESIA:   local, regional and general  PREOPERATIVE INDICATIONS:  Andrea [name] is a  65 y.o. female with a diagnosis of left shoulder high grade partial thickness rotator cuff tear who failed conservative measures and elected for surgical management.    The risks benefits and alternatives were discussed with the patient preoperatively including but not limited to the risks of infection, bleeding, nerve injury, persistent pain or weakness, failure of the hardware, re-tear of the rotator cuff and the need for further surgery. Medical risks include DVT and pulmonary embolism, myocardial infarction, stroke, pneumonia, respiratory failure and death. Patient understood these risks and wished to proceed.  OPERATIVE IMPLANTS: ArthroCare Magnum 2 anchors 3 and Q fix anchor 1  OPERATIVE FINDINGS: Extensive glenohumeral joint arthritis, high-grade partial-thickness tear involving the supraspinatus, torn superior labrum, degenerative biceps tendon with tendinosis, small longitudinal subscapularis tear without detachment from the lesser tuberosity, subacromial spur   OPERATIVE PROCEDURE: The patient was met in the preoperative area. The operative shoulder was signed with the word yes and my initials according the hospital's correct site of surgery protocol. The  patient underwent placement of a left interscalene block by the anesthesia service.  Patient was brought to the operating room where underwent general endotracheal intubation following her interscalene block . The patient was placed in a beachchair position. A spider arm positioner was used for this case. Examination under anesthesia revealed no limitation of motion or instability with load shift testing. The patient had a negative sulcus sign.  The patient was prepped and draped in a sterile fashion. A timeout was performed to verify the patient's name, date of birth, medical record number, correct site of surgery and correct procedure to be performed there was also used to verify the patient received antibiotics that all appropriate instruments, implants and radiographs studies were available in the room. Once all in attendance were in agreement case began.  Bony landmarks were drawn out with a surgical marker along with proposed arthroscopy incisions. These were pre-injected with 1% lidocaine plain. An 11 blade was used to establish a posterior portal through which the arthroscope was placed in the glenohumeral joint. A full diagnostic examination of the shoulder was performed. the anterior portal was established under direct visualization with an 18-gauge spinal needle. A 5.75 the medial arthroscopic cannula was placed through this anterior portal.   The intra-articular portion of the biceps tendon was found to have advanced intratendinous degeneration with significant thickening of the tendon, therefore the decision was made to perform a tenodesis. An Arthocare Perfect Pass suture placed in the biceps tendon and a tenotomy was performed.  18-gauge spinal needle was used to pass a 0 PDS suture through the rotator cuff tear for identification from the bursal side.   The arthroscope was then placed in the subacromial space. A lateral portal was then established using an 18-gauge spinal needle for  localization.   0 PDS suture was  identified. The supraspinatus tear was completed using a 4.29m resector shaver blade. A single Arthrocare Perfect Pass suture was placed in the lateral border of the rotator cuff tear. The greater tuberosity was debrided using a 4.0 mm resector shaver blade to remove all remaining foreign fibers of the rotator cuff.  Debridement was performed until punctate bleeding was seen at the greater tuberosity footprint, which will allow for rotator cuff healing..Marland Kitchen  Extensive bursitis was encountered and debrided using a 4-0 resector shaver blade and a 90 ArthroCare wand from the lateral portal. A subacromial decompression was also performed using a 5.5 mm resector shaver blade from the lateral portal. the before meals joint had scar tissue debridement from the with the 90 ArthroCare wand. Patient had a previous distal clavicle excision and no further excision was required.  All arthroscopic instruments were then removed and the mini-open portion of the procedure began.   A saber-type incision was made along the lateral border of the acromion. The deltoid muscle was identified and split in line with its fibers which allowed visualization of the rotator cuff. The biceps tendon and its associated tagging suture were brought out through the deltoid split. The Perfect Pass suture previously placed in the lateral border of the rotator cuff was also brought out through the deltoid split.   Asecond perfect pass suture  was placed in the biceps tendon  and approximately 15of the intra-articular biceps was resected using a #15 blade.   A tenodesis was performed placing the biceps tendon at the top of the bicipital groove with a single Magnum 2 anchor.    A second perfect pass suture was then placed through the lateral border of the supraspinatus tear. The lateral edge of the rotator cuff was debrided until healthy, viable tissue remained.  A Q fix anchor was then placed at the articular  margin of the humeral head. The 4 limbs of this anchor were then passed medially through the rotator cuff with a perfect pass suture passer. These were clamped with a hemostat for later medial row fixation.  The two perfect pass sutures from the lateral border of the rotator cuff were then anchored to thegreater tuberosity of the humeral head using two Magnum 2 anchors. These anchors were tensioned to allow for anatomic reduction of the rotator cuff to the greater tuberosity footprint. The medial row repair was then completed using an arthroscopic knot tying technique with the Q fix anchor sutures. Once all sutures were tied down, arthroscopic images of thedouble row repair were taken with the arthroscope both externally and arthroscopically fromthe glenohumeral joint.  All incisions were copiously irrigated. The deltoid fascia was repaired using a 0 Vicryl suturean interrupted fashion. . The subcutaneous tissue of all incisions were closed with a 2-0 Vicryl. Skin closure for the arthroscopic incisions was performed with 4-0 nylon. The skin edges of the saber incision were approximated with a running 4-0 undyed Monocryl. A dry sterile dressing was applied. The patient was placed in an abduction sling, with a Polar Care sleeve.  All sharp and instrument counts were correct at the conclusion of the case. I was scrubbed and present for the entire case. I spoke with the patient's husband postoperatively to let him know the case had gone without complication and the patient was stable in recovery room.

## 2015-08-19 NOTE — Discharge Instructions (Signed)
AMBULATORY SURGERY  °DISCHARGE INSTRUCTIONS ° ° °1) The drugs that you were given will stay in your system until tomorrow so for the next 24 hours you should not: ° °A) Drive an automobile °B) Make any legal decisions °C) Drink any alcoholic beverage ° ° °2) You may resume regular meals tomorrow.  Today it is better to start with liquids and gradually work up to solid foods. ° °You may eat anything you prefer, but it is better to start with liquids, then soup and crackers, and gradually work up to solid foods. ° ° °3) Please notify your doctor immediately if you have any unusual bleeding, trouble breathing, redness and pain at the surgery site, drainage, fever, or pain not relieved by medication. ° ° ° °4) Additional Instructions: ° ° ° ° ° ° ° °Please contact your physician with any problems or Same Day Surgery at [phone], Monday through Friday 6 am to 4 pm, or [place] at [place] Main number at [phone]. °

## 2015-08-19 NOTE — Anesthesia Postprocedure Evaluation (Signed)
Anesthesia Post Note  Patient: Andrea [name]  Procedure(s) Performed: Procedure(s) (LRB): SHOULDER ARTHROSCOPY WITH  MINI OPEN ROTATOR CUFF REPAIR,SUBACROMIAL DECOMPRESSION, ARTHROSCOPIC BICEPS TENODESIS (Left)  Patient location during evaluation: PACU Anesthesia Type: General Level of consciousness: awake and alert Pain management: pain level controlled Vital Signs Assessment: post-procedure vital signs reviewed and stable Respiratory status: spontaneous breathing, nonlabored ventilation, respiratory function stable and patient connected to nasal cannula oxygen Cardiovascular status: blood pressure returned to baseline and stable Postop Assessment: no signs of nausea or vomiting Anesthetic complications: no    Last Vitals:  Filed Vitals:   08/19/15 1202 08/19/15 1219  BP:  109/66  Pulse: 59 63  Temp:  36.3 C  Resp: 16 16    Last Pain:  Filed Vitals:   08/19/15 1222  PainSc: 5                  Martha Clan

## 2015-08-19 NOTE — Transfer of Care (Signed)
Immediate Anesthesia Transfer of Care Note  Patient: Andrea [name]  Procedure(s) Performed: Procedure(s): SHOULDER ARTHROSCOPY WITH  MINI OPEN ROTATOR CUFF REPAIR,SUBACROMIAL DECOMPRESSION, ARTHROSCOPIC BICEPS TENODESIS (Left)  Patient Location: PACU  Anesthesia Type:GA combined with regional for post-op pain  Level of Consciousness: sedated  Airway & Oxygen Therapy: Patient Spontanous Breathing and Patient connected to face mask oxygen  Post-op Assessment: Report given to RN and Post -op Vital signs reviewed and stable  Post vital signs: stable  Last Vitals:  Filed Vitals:   08/19/15 0616 08/19/15 1112  BP: 120/59 125/64  Pulse: 65 78  Temp: 36.6 C 36.1 C  Resp: 16 19    Complications: No apparent anesthesia complications

## 2015-09-01 ENCOUNTER — Encounter: Payer: Self-pay | Admitting: Physical Therapy

## 2015-09-01 ENCOUNTER — Ambulatory Visit: Payer: Medicare Other | Attending: Orthopedic Surgery | Admitting: Physical Therapy

## 2015-09-01 DIAGNOSIS — M25612 Stiffness of left shoulder, not elsewhere classified: Secondary | ICD-10-CM | POA: Diagnosis present

## 2015-09-01 DIAGNOSIS — M6281 Muscle weakness (generalized): Secondary | ICD-10-CM

## 2015-09-01 DIAGNOSIS — M25512 Pain in left shoulder: Secondary | ICD-10-CM | POA: Insufficient documentation

## 2015-09-02 NOTE — Therapy (Signed)
[facility] [address]. [place], [place], [address] Phone: [phone]   Fax:  [phone]  Physical Therapy Evaluation  Patient Details  Name: Andrea [name] MRN: [id number] Date of Birth: 08-14-1950 Referring Provider: Dr. Mack Guise  Encounter Date: 09/01/2015      PT End of Session - 09/01/15 1253    Visit Number 1   Number of Visits 8   Date for PT Re-Evaluation 09/29/15   Authorization - Visit Number 1   Authorization - Number of Visits 10   PT Start Time 1244   PT Stop Time 1345   PT Time Calculation (min) 61 min   Equipment Utilized During Treatment Other (comment)  shoulder sling with pillow   Activity Tolerance Patient tolerated treatment well;Patient limited by pain      Past Medical History  Diagnosis Date  . Disorders of sacrum   . Cervical post-laminectomy syndrome   . Cubital tunnel syndrome   . Unspecified musculoskeletal disorders and symptoms referable to neck     cervical/trapezius  . Lateral epicondylitis  of elbow   . Disturbance of skin sensation   . Late effects of acute poliomyelitis   . Hypertension   . Parkinson's disease ([place]) 2011  . Peripheral vascular disease ([place]) 2011    venous stasis   . Radiation 2008    BREAST CA  . Complication of anesthesia     itching real bad or msucles spasms  . 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 Breast cancer ([place]) 2008    RT LUMPECTOMY  . Breast cancer ([place]) 2011    RT MASTECTOMY    Past Surgical History  Procedure Laterality Date  . Spine surgery    . Shoulder arthroscopy w/ rotator cuff repair Bilateral [id number]  . Carpal tunnel release    . Abdominal hysterectomy  1997  . Colonoscopy  [id number]    Dr. Allen Norris, Dr. Vira Agar  . Elbow surgery Left 2011  . Hip surgery Right 06/19/12  . Shoulder surgery Left 2014  . Elbow surgery Left 2015  . Mastectomy Right 2011    BREAST CA  . Breast lumpectomy  Right 2008    wide excision, whole breast radiation  . Breast surgery Right December 04, 2009    Right simple mastectomy, sentinel node biopsy.  . Leg surgery Right 1958    Corrcetive surgery for polio  . Leg surgery Left 1958    corrective surgery for polio  . Toe fusion Left 1970    little toe fusion  . Shoulder arthroscopy with open rotator cuff repair Left 08/19/2015    Procedure: SHOULDER ARTHROSCOPY WITH  MINI OPEN ROTATOR CUFF REPAIR,SUBACROMIAL DECOMPRESSION, ARTHROSCOPIC BICEPS TENODESIS;  Surgeon: Thornton Park, MD;  Location: [facility] ORS;  Service: Orthopedics;  Laterality: Left;    There were no vitals filed for this visit.       Subjective Assessment - 09/01/15 1249    Subjective Pt. c/o 3/10 L shoulder pain currenlty at rest and states use of sling is aggravating neck.  Pt. is scheduled to see MD tomorrow to discuss the use of another sling/ brace.     Limitations Writing;House hold activities;Other (comment);Lifting   Patient Stated Goals Increase L shoulder ROM/ strengthening.     Currently in Pain? Yes   Pain Score 3    Pain Location Shoulder   Pain Orientation Left   Pain Descriptors / Indicators Aching  Pain Type Surgical pain   Multiple Pain Sites Yes   Pain Score 3   Pain Location Back   Pain Orientation Lower;Right;Left   Pain Descriptors / Indicators Aching   Pain Type Chronic pain       Manual tx.: supine L shoulder PROM per MD protocol/ pain tolerable 10x each with static holds.  STM to L deltoid/ bicep musculature.  Ice to L shoulder in sitting after tx. Session.         PT Education - 09/02/15 1236    Education provided Yes   Education Details See HEP/ MD protocol   Person(s) Educated Patient   Methods Explanation;Demonstration;Handout   Comprehension Verbalized understanding;Returned demonstration             PT Long Term Goals - 09/02/15 1243    PT LONG TERM GOAL #1   Title Pt. I with HEP to increase L shoulder AA/PROM to WNL as  compared to R shoulder in supine position to improve pain-free mobility.    Baseline R shoulder AROM WFL with pain reported at 150 deg. flexion (previous RTC surgery in 2012). Supine L shoulder PROM: flexion 98 deg., abd. 88 deg., ER 10 deg.   Time 4   Period Weeks   Status New   PT LONG TERM GOAL #2   Title Pt. will increase L grip strength to >40# to improve grasping/ household ADLs in pain tolerable range.     Baseline R 49.5# and L 33.4#   Time 4   Period Weeks   Status New   PT LONG TERM GOAL #3   Title Pt. able to progress to L shoulder AROM >120 deg. with no increase c/o pain in 4 weeks to promote return to PLOF.     Baseline No AROM at this time.    Time 4   Period Weeks   Status New   PT LONG TERM GOAL #4   Title Pt. able to sleep in bed wtih proper positioning and no increase c/o L shoulder pain to improve sleep/rest.     Baseline difficulty sleeping in bed   Time 4   Period Weeks   Status New               Plan - 09/02/15 1237    Clinical Impression Statement Pt. is a 65 y/o female s/p L RTC repair on 08/19/15.  Pt. reports 4/10 L shoulder pain currently at rest in sling and c/o chronic low back pain.  Steristrips in place with no localized swelling or bruising noted over shoulder/incisions.  R shoulder AROM WFL with pain reported at 150 deg. flexion (previous RTC surgery in 2012).  Supine L shoulder PROM: flexion 98 deg., abd. 88 deg., ER 10 deg.  Pt. has good L elbow/ wrist  AROM (all planes).  PT explained MD protocol and importance of PROM/ sling use at this time.  Grip strength (position #3): R 49.5# and L 33.4#.  Pt. will benefit from skilled PT services to increase L shoulder ROM/ strengthening over next 8 weeks to iimprove pain-free mobility.     Rehab Potential Good   PT Frequency 2x / week   PT Duration 4 weeks   PT Treatment/Interventions ADLs/Self Care Home Management;Aquatic Therapy;Therapeutic exercise;Therapeutic activities;Functional mobility  training;Patient/family education;Manual techniques;Energy conservation;Passive range of motion;Scar mobilization   PT Next Visit Plan PROM/ see MD protocol.     PT Home Exercise Plan See handouts   Recommended Other Services Silver Sneakers   Consulted and  Agree with Plan of Care Patient      Patient will benefit from skilled therapeutic intervention in order to improve the following deficits and impairments:  Hypomobility, Decreased activity tolerance, Decreased strength, Impaired UE functional use, Pain, Decreased mobility, Decreased range of motion, Improper body mechanics, Postural dysfunction, Impaired flexibility  Visit Diagnosis: Pain in left shoulder  Shoulder joint stiffness, left  Muscle weakness (generalized)      G-Codes - 17-Sep-2015 1549    Functional Assessment Tool Used Clinical impression/ pain/ ROM/ muscle weakness   Functional Limitation Carrying, moving and handling objects   Carrying, Moving and Handling Objects Current Status [id number]) At least 80 percent but less than 100 percent impaired, limited or restricted   Carrying, Moving and Handling Objects Goal Status [id number]) At least 20 percent but less than 40 percent impaired, limited or restricted       Problem List Patient Active Problem List   Diagnosis Date Noted  . Personal history of malignant neoplasm of breast 07/26/2014  . 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  . Lumbosacral spondylosis without myelopathy 04/30/2013  . Subacute lumbar radiculopathy 04/30/2013  . Chronic low back pain 02/19/2013  . 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 Trochanteric bursitis of right hip 04/11/2012  . Sacroiliac joint dysfunction of both sides 07/12/2011   Pura Spice, PT, DPT # [phone]   09/02/2015, 12:51 PM  [place] [facility] [address] [place], [place], [address] Phone: [phone]   Fax:  [phone]  Name: Andrea [name] MRN: [id number] Date of Birth:  06/01/1950

## 2015-09-04 ENCOUNTER — Ambulatory Visit: Payer: Medicare Other | Admitting: Physical Therapy

## 2015-09-04 DIAGNOSIS — M25512 Pain in left shoulder: Secondary | ICD-10-CM

## 2015-09-04 DIAGNOSIS — M6281 Muscle weakness (generalized): Secondary | ICD-10-CM

## 2015-09-04 DIAGNOSIS — M25612 Stiffness of left shoulder, not elsewhere classified: Secondary | ICD-10-CM

## 2015-09-05 ENCOUNTER — Encounter: Payer: Self-pay | Admitting: Physical Therapy

## 2015-09-05 NOTE — Therapy (Addendum)
[place] [facility] [address] [place], [place], [address] Phone: [phone]   Fax:  [phone]  Physical Therapy Treatment  Patient Details  Name: Andrea [name] MRN: [id number] Date of Birth: 1950-08-27 Referring Provider: Dr. Mack Guise  Encounter Date: 09/04/2015    Past Medical History  Diagnosis Date  . Disorders of sacrum   . Cervical post-laminectomy syndrome   . Cubital tunnel syndrome   . Unspecified musculoskeletal disorders and symptoms referable to neck     cervical/trapezius  . Lateral epicondylitis  of elbow   . Disturbance of skin sensation   . Late effects of acute poliomyelitis   . Hypertension   . Parkinson's disease ([place]) 2011  . Peripheral vascular disease ([place]) 2011    venous stasis   . Radiation 2008    BREAST CA  . Complication of anesthesia     itching real bad or msucles spasms  . 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 Breast cancer ([place]) 2008    RT LUMPECTOMY  . Breast cancer ([place]) 2011    RT MASTECTOMY    Past Surgical History  Procedure Laterality Date  . Spine surgery    . Shoulder arthroscopy w/ rotator cuff repair Bilateral [id number]  . Carpal tunnel release    . Abdominal hysterectomy  1997  . Colonoscopy  [id number]    Dr. [place] Norris, Dr. Vira Agar  . Elbow surgery Left 2011  . Hip surgery Right 06/19/12  . Shoulder surgery Left 2014  . Elbow surgery Left 2015  . Mastectomy Right 2011    BREAST CA  . Breast lumpectomy Right 2008    wide excision, whole breast radiation  . Breast surgery Right December 04, 2009    Right simple mastectomy, sentinel node biopsy.  . Leg surgery Right 1958    Corrcetive surgery for polio  . Leg surgery Left 1958    corrective surgery for polio  . Toe fusion Left 1970    little toe fusion  . Shoulder arthroscopy with open rotator cuff repair Left 08/19/2015    Procedure: SHOULDER ARTHROSCOPY WITH  MINI OPEN ROTATOR  CUFF REPAIR,SUBACROMIAL DECOMPRESSION, ARTHROSCOPIC BICEPS TENODESIS;  Surgeon: Thornton Park, MD;  Location: [facility] ORS;  Service: Orthopedics;  Laterality: Left;    There were no vitals filed for this visit.    Pt. states MD didn't like the sling that she showed him the other day and she has continued to use intial sling she was given.  Pt. c/o 3/10 L shoulder pain currently at rest.  Good compliance with HEP/icing.     OBJECTIVE: Manual tx.: supine L shoulder AP/PA/inf. Grade II-III Mobs. 3x20 sec. Each.  STM to L shoulder/ proximal biceps/triceps region 6 min.  L shoulder PROM (all planes) in pain tolerable range per MD protocol.      Pt response for medical necessity:  Pt. Benefits from L shoulder PROM/ manual tx. At this time to promote increase sh./capsular mobility to progress with MD protocol.        L shoulder PROM increasing in pain tolerable range (supine position).  L shoulder PROM: flexion 109 deg., abd. 94 deg., ER 19 deg.  Minimal tenderness with palpation to L deltiod/ prox. biceps region.           PT Long Term Goals - 09/02/15 1243    PT LONG TERM GOAL #1   Title Pt. I with HEP to increase L  shoulder AA/PROM to WNL as compared to R shoulder in supine position to improve pain-free mobility.    Baseline R shoulder AROM WFL with pain reported at 150 deg. flexion (previous RTC surgery in 2012). Supine L shoulder PROM: flexion 98 deg., abd. 88 deg., ER 10 deg.   Time 4   Period Weeks   Status New   PT LONG TERM GOAL #2   Title Pt. will increase L grip strength to >40# to improve grasping/ household ADLs in pain tolerable range.     Baseline R 49.5# and L 33.4#   Time 4   Period Weeks   Status New   PT LONG TERM GOAL #3   Title Pt. able to progress to L shoulder AROM >120 deg. with no increase c/o pain in 4 weeks to promote return to PLOF.     Baseline No AROM at this time.    Time 4   Period Weeks   Status New   PT LONG TERM GOAL #4   Title Pt. able to sleep  in bed wtih proper positioning and no increase c/o L shoulder pain to improve sleep/rest.     Baseline difficulty sleeping in bed   Time 4   Period Weeks   Status New       Patient will benefit from skilled therapeutic intervention in order to improve the following deficits and impairments:  Hypomobility, Decreased activity tolerance, Decreased strength, Impaired UE functional use, Pain, Decreased mobility, Decreased range of motion, Improper body mechanics, Postural dysfunction, Impaired flexibility  Visit Diagnosis: Pain in left shoulder  Shoulder joint stiffness, left  Muscle weakness (generalized)     Problem List Patient Active Problem List   Diagnosis Date Noted  . Personal history of malignant neoplasm of breast 07/26/2014  . 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  . Lumbosacral spondylosis without myelopathy 04/30/2013  . Subacute lumbar radiculopathy 04/30/2013  . Chronic low back pain 02/19/2013  . 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 Trochanteric bursitis of right hip 04/11/2012  . Sacroiliac joint dysfunction of both sides 07/12/2011   Pura Spice, PT, DPT # [phone]   09/08/2015, 3:00 PM  [place] [facility] [address] [place], [place], [address] Phone: [phone]   Fax:  [phone]  Name: Andrea [name] MRN: [id number] Date of Birth: 1951/04/24

## 2015-09-09 ENCOUNTER — Ambulatory Visit: Payer: Medicare Other | Admitting: Physical Therapy

## 2015-09-09 DIAGNOSIS — M25612 Stiffness of left shoulder, not elsewhere classified: Secondary | ICD-10-CM

## 2015-09-09 DIAGNOSIS — M25512 Pain in left shoulder: Secondary | ICD-10-CM | POA: Diagnosis not present

## 2015-09-09 DIAGNOSIS — M6281 Muscle weakness (generalized): Secondary | ICD-10-CM

## 2015-09-10 NOTE — Therapy (Addendum)
[place] [facility] [address] [place], [place], [address] Phone: [phone]   Fax:  [phone]  Physical Therapy Treatment  Patient Details  Name: Andrea [name] MRN: [id number] Date of Birth: 1950-11-06 Referring Provider: Dr. Mack Guise  Encounter Date: 09/09/2015    Past Medical History  Diagnosis Date  . Disorders of sacrum   . Cervical post-laminectomy syndrome   . Cubital tunnel syndrome   . Unspecified musculoskeletal disorders and symptoms referable to neck     cervical/trapezius  . Lateral epicondylitis  of elbow   . Disturbance of skin sensation   . Late effects of acute poliomyelitis   . Hypertension   . Parkinson's disease ([place]) 2011  . Peripheral vascular disease ([place]) 2011    venous stasis   . Radiation 2008    BREAST CA  . Complication of anesthesia     itching real bad or msucles spasms  . 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 Breast cancer ([place]) 2008    RT LUMPECTOMY  . Breast cancer ([place]) 2011    RT MASTECTOMY    Past Surgical History  Procedure Laterality Date  . Spine surgery    . Shoulder arthroscopy w/ rotator cuff repair Bilateral [id number]  . Carpal tunnel release    . Abdominal hysterectomy  1997  . Colonoscopy  [id number]    Dr. [place] Norris, Dr. Vira Agar  . Elbow surgery Left 2011  . Hip surgery Right 06/19/12  . Shoulder surgery Left 2014  . Elbow surgery Left 2015  . Mastectomy Right 2011    BREAST CA  . Breast lumpectomy Right 2008    wide excision, whole breast radiation  . Breast surgery Right December 04, 2009    Right simple mastectomy, sentinel node biopsy.  . Leg surgery Right 1958    Corrcetive surgery for polio  . Leg surgery Left 1958    corrective surgery for polio  . Toe fusion Left 1970    little toe fusion  . Shoulder arthroscopy with open rotator cuff repair Left 08/19/2015    Procedure: SHOULDER ARTHROSCOPY WITH  MINI OPEN ROTATOR  CUFF REPAIR,SUBACROMIAL DECOMPRESSION, ARTHROSCOPIC BICEPS TENODESIS;  Surgeon: Thornton Park, MD;  Location: [facility] ORS;  Service: Orthopedics;  Laterality: Left;    There were no vitals filed for this visit.      Pt. Reports no new complaints and remains compliant with sling use/ HEP.       Manual: Supine L shoulder PROM all planes per MD protocol 22 min.  STM to shoulder/ proximal bicep region.  Reassessment of incision/ healing.         Pt. able to relax shoulder during PROM (all planes per MD protocol).  Incision healing well and pt. compliant with proper HEP/ icing to shoulder.         PT Long Term Goals - 10/23/15 1050    PT LONG TERM GOAL #1   Title Pt. I with HEP to increase L shoulder AA/PROM to WNL as compared to R shoulder in supine position to improve pain-free mobility.    Time 4   Period Weeks   Status Achieved   PT LONG TERM GOAL #2   Title Pt. will increase L grip strength to >40# to improve grasping/ household ADLs in pain tolerable range.     Baseline L 46.2#, R 54#   Time 4   Period Weeks   Status Achieved  PT LONG TERM GOAL #3   Title Pt. able to progress to L shoulder AROM >120 deg. with no increase c/o pain in 4 weeks to promote return to PLOF.     Baseline L shoulder flexion: 124 deg., abd. 122 deg., ER 70 deg., ER 70 deg.     Time 4   Period Weeks   Status Achieved   PT LONG TERM GOAL #4   Title Pt. able to sleep in bed wtih proper positioning and no increase c/o L shoulder pain to improve sleep/rest.     Time 4   Period Weeks   Status Partially Met   PT LONG TERM GOAL #5   Title Pt. will increase L shoulder AROM to [facility] as compared to R shoulder to imporove pain-free mobility.    Time 4   Period Weeks   Status Achieved             Patient will benefit from skilled therapeutic intervention in order to improve the following deficits and impairments:  Hypomobility, Decreased activity tolerance, Decreased strength, Impaired UE  functional use, Pain, Decreased mobility, Decreased range of motion, Improper body mechanics, Postural dysfunction, Impaired flexibility  Visit Diagnosis: Pain in left shoulder  Shoulder joint stiffness, left  Muscle weakness (generalized)     Problem List Patient Active Problem List   Diagnosis Date Noted  . Personal history of malignant neoplasm of breast 07/26/2014  . 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  . Lumbosacral spondylosis without myelopathy 04/30/2013  . Subacute lumbar radiculopathy 04/30/2013  . Chronic low back pain 02/19/2013  . 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 Trochanteric bursitis of right hip 04/11/2012  . Sacroiliac joint dysfunction of both sides 07/12/2011   Pura Spice, PT, DPT # [phone]   10/30/2015, 7:44 AM  [place] [facility] [address] [place], [place], [address] Phone: [phone]   Fax:  [phone]  Name: Andrea [name] MRN: [id number] Date of Birth: Sep 22, 1950

## 2015-09-11 ENCOUNTER — Ambulatory Visit: Payer: Medicare Other | Admitting: Physical Therapy

## 2015-09-11 DIAGNOSIS — M6281 Muscle weakness (generalized): Secondary | ICD-10-CM

## 2015-09-11 DIAGNOSIS — M25612 Stiffness of left shoulder, not elsewhere classified: Secondary | ICD-10-CM

## 2015-09-11 DIAGNOSIS — M25512 Pain in left shoulder: Secondary | ICD-10-CM | POA: Diagnosis not present

## 2015-09-12 NOTE — Therapy (Signed)
[place] [facility] [address] [place], [place], [address] Phone: [phone]   Fax:  [phone]  Physical Therapy Treatment  Patient Details  Name: Andrea [name] MRN: [id number] Date of Birth: 09-19-1950 Referring Provider: Dr. Mack Guise  Encounter Date: 09/11/2015      PT End of Session - 09/12/15 0823    Visit Number 4   Number of Visits 8   Date for PT Re-Evaluation 09/29/15   Authorization - Visit Number 4   Authorization - Number of Visits 10   PT Start Time 1106   PT Stop Time 1155   PT Time Calculation (min) 49 min   Equipment Utilized During Treatment Other (comment)   Activity Tolerance Patient tolerated treatment well;Patient limited by pain   Behavior During Therapy [facility] for tasks assessed/performed      Past Medical History  Diagnosis Date  . Disorders of sacrum   . Cervical post-laminectomy syndrome   . Cubital tunnel syndrome   . Unspecified musculoskeletal disorders and symptoms referable to neck     cervical/trapezius  . Lateral epicondylitis  of elbow   . Disturbance of skin sensation   . Late effects of acute poliomyelitis   . Hypertension   . Parkinson's disease ([place]) 2011  . Peripheral vascular disease ([place]) 2011    venous stasis   . Radiation 2008    BREAST CA  . Complication of anesthesia     itching real bad or msucles spasms  . 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 Breast cancer ([place]) 2008    RT LUMPECTOMY  . Breast cancer ([place]) 2011    RT MASTECTOMY    Past Surgical History  Procedure Laterality Date  . Spine surgery    . Shoulder arthroscopy w/ rotator cuff repair Bilateral [id number]  . Carpal tunnel release    . Abdominal hysterectomy  1997  . Colonoscopy  [id number]    Dr. [place] Norris, Dr. Vira Agar  . Elbow surgery Left 2011  . Hip surgery Right 06/19/12  . Shoulder surgery Left 2014  . Elbow surgery Left 2015  . Mastectomy Right 2011   BREAST CA  . Breast lumpectomy Right 2008    wide excision, whole breast radiation  . Breast surgery Right December 04, 2009    Right simple mastectomy, sentinel node biopsy.  . Leg surgery Right 1958    Corrcetive surgery for polio  . Leg surgery Left 1958    corrective surgery for polio  . Toe fusion Left 1970    little toe fusion  . Shoulder arthroscopy with open rotator cuff repair Left 08/19/2015    Procedure: SHOULDER ARTHROSCOPY WITH  MINI OPEN ROTATOR CUFF REPAIR,SUBACROMIAL DECOMPRESSION, ARTHROSCOPIC BICEPS TENODESIS;  Surgeon: Thornton Park, MD;  Location: [facility] ORS;  Service: Orthopedics;  Laterality: Left;    There were no vitals filed for this visit.      Subjective Assessment - 09/12/15 0822    Subjective Pt. reports shoulder soreness but doing well.  Pt. remains compliant with sling/ MD protocol.     Limitations Writing;House hold activities;Other (comment);Lifting   Patient Stated Goals Increase L shoulder ROM/ strengthening.     Currently in Pain? Yes   Pain Score 3    Pain Location Shoulder   Pain Orientation Left             PT Long Term Goals - 09/02/15 1243    PT LONG TERM  GOAL #1   Title Pt. I with HEP to increase L shoulder AA/PROM to WNL as compared to R shoulder in supine position to improve pain-free mobility.    Baseline R shoulder AROM WFL with pain reported at 150 deg. flexion (previous RTC surgery in 2012). Supine L shoulder PROM: flexion 98 deg., abd. 88 deg., ER 10 deg.   Time 4   Period Weeks   Status New   PT LONG TERM GOAL #2   Title Pt. will increase L grip strength to >40# to improve grasping/ household ADLs in pain tolerable range.     Baseline R 49.5# and L 33.4#   Time 4   Period Weeks   Status New   PT LONG TERM GOAL #3   Title Pt. able to progress to L shoulder AROM >120 deg. with no increase c/o pain in 4 weeks to promote return to PLOF.     Baseline No AROM at this time.    Time 4   Period Weeks   Status New   PT LONG  TERM GOAL #4   Title Pt. able to sleep in bed wtih proper positioning and no increase c/o L shoulder pain to improve sleep/rest.     Baseline difficulty sleeping in bed   Time 4   Period Weeks   Status New             Patient will benefit from skilled therapeutic intervention in order to improve the following deficits and impairments:     Visit Diagnosis: Pain in left shoulder  Shoulder joint stiffness, left  Muscle weakness (generalized)     Problem List Patient Active Problem List   Diagnosis Date Noted  . Personal history of malignant neoplasm of breast 07/26/2014  . 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  . Lumbosacral spondylosis without myelopathy 04/30/2013  . Subacute lumbar radiculopathy 04/30/2013  . Chronic low back pain 02/19/2013  . 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 Trochanteric bursitis of right hip 04/11/2012  . Sacroiliac joint dysfunction of both sides 07/12/2011   Pura Spice, PT, DPT # [phone]   09/12/2015, 8:25 AM  [place] [facility] [address] [place], [place], [address] Phone: [phone]   Fax:  [phone]  Name: VINDHYA VARMA MRN: [id number] Date of Birth: 1950-07-19

## 2015-09-16 ENCOUNTER — Ambulatory Visit: Payer: Medicare Other | Admitting: Physical Therapy

## 2015-09-16 DIAGNOSIS — M25512 Pain in left shoulder: Secondary | ICD-10-CM

## 2015-09-16 DIAGNOSIS — M25612 Stiffness of left shoulder, not elsewhere classified: Secondary | ICD-10-CM

## 2015-09-16 DIAGNOSIS — M6281 Muscle weakness (generalized): Secondary | ICD-10-CM

## 2015-09-17 ENCOUNTER — Encounter: Payer: Self-pay | Admitting: Physical Therapy

## 2015-09-17 NOTE — Therapy (Signed)
[place] [facility] [address] [place], [place], [address] Phone: [phone]   Fax:  [phone]  Physical Therapy Treatment  Patient Details  Name: Andrea [name] MRN: [id number] Date of Birth: 27-Jul-1950 Referring Provider: Dr. Mack Guise  Encounter Date: 09/16/2015      PT End of Session - 09/16/15 1106    Visit Number 5   Number of Visits 8   Date for PT Re-Evaluation 09/29/15   Authorization - Visit Number 5   Authorization - Number of Visits 10   PT Start Time 1104   PT Stop Time 1157   PT Time Calculation (min) 53 min   Equipment Utilized During Treatment Other (comment)   Activity Tolerance Patient tolerated treatment well;Patient limited by pain   Behavior During Therapy [facility] for tasks assessed/performed      Past Medical History  Diagnosis Date  . Disorders of sacrum   . Cervical post-laminectomy syndrome   . Cubital tunnel syndrome   . Unspecified musculoskeletal disorders and symptoms referable to neck     cervical/trapezius  . Lateral epicondylitis  of elbow   . Disturbance of skin sensation   . Late effects of acute poliomyelitis   . Hypertension   . Parkinson's disease ([place]) 2011  . Peripheral vascular disease ([place]) 2011    venous stasis   . Radiation 2008    BREAST CA  . Complication of anesthesia     itching real bad or msucles spasms  . 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 Breast cancer ([place]) 2008    RT LUMPECTOMY  . Breast cancer ([place]) 2011    RT MASTECTOMY    Past Surgical History  Procedure Laterality Date  . Spine surgery    . Shoulder arthroscopy w/ rotator cuff repair Bilateral [id number]  . Carpal tunnel release    . Abdominal hysterectomy  1997  . Colonoscopy  [id number]    Dr. [place] Norris, Dr. Vira Agar  . Elbow surgery Left 2011  . Hip surgery Right 06/19/12  . Shoulder surgery Left 2014  . Elbow surgery Left 2015  . Mastectomy Right 2011   BREAST CA  . Breast lumpectomy Right 2008    wide excision, whole breast radiation  . Breast surgery Right December 04, 2009    Right simple mastectomy, sentinel node biopsy.  . Leg surgery Right 1958    Corrcetive surgery for polio  . Leg surgery Left 1958    corrective surgery for polio  . Toe fusion Left 1970    little toe fusion  . Shoulder arthroscopy with open rotator cuff repair Left 08/19/2015    Procedure: SHOULDER ARTHROSCOPY WITH  MINI OPEN ROTATOR CUFF REPAIR,SUBACROMIAL DECOMPRESSION, ARTHROSCOPIC BICEPS TENODESIS;  Surgeon: Thornton Park, MD;  Location: [facility] ORS;  Service: Orthopedics;  Laterality: Left;    There were no vitals filed for this visit.      Subjective Assessment - 09/16/15 1105    Subjective Pt. reports no new complaints.  Pt. remains compliant with use of shoulder sling and HEP per MD protocol.  Pt. returns for MD f/u tomorrow at 11AM.     Limitations Writing;House hold activities;Other (comment);Lifting   Patient Stated Goals Increase L shoulder ROM/ strengthening.     Currently in Pain? Yes   Pain Score 3    Pain Location Shoulder   Pain Orientation Left   Pain Descriptors / Indicators Aching   Pain Type Surgical  pain      OBJECTIVE:  Manual tx.: Reviewed HEP/ pulley ex. Warm-up PROM flexion and abd. 20x each (marked increase in L shoulder mobility with no increase c/o pain).  Supine L shoulder PROM all planes 15x2 with static stretching.  L shoulder AP/PA/inf. Grade II-III mobs. 3x20 sec. (no pain with good capsular mobility).  Pt. Will ice at home.      Pt response for medical necessity: pt. Benefits from skilled PT services to increase L shoulder ROM/ strengthening per MD protocol.  Pt. Progressing well with capsular mobility/ L shoulder PROM and pain mgmt.          PT Long Term Goals - 09/02/15 1243    PT LONG TERM GOAL #1   Title Pt. I with HEP to increase L shoulder AA/PROM to WNL as compared to R shoulder in supine position to improve  pain-free mobility.    Baseline R shoulder AROM WFL with pain reported at 150 deg. flexion (previous RTC surgery in 2012). Supine L shoulder PROM: flexion 98 deg., abd. 88 deg., ER 10 deg.   Time 4   Period Weeks   Status New   PT LONG TERM GOAL #2   Title Pt. will increase L grip strength to >40# to improve grasping/ household ADLs in pain tolerable range.     Baseline R 49.5# and L 33.4#   Time 4   Period Weeks   Status New   PT LONG TERM GOAL #3   Title Pt. able to progress to L shoulder AROM >120 deg. with no increase c/o pain in 4 weeks to promote return to PLOF.     Baseline No AROM at this time.    Time 4   Period Weeks   Status New   PT LONG TERM GOAL #4   Title Pt. able to sleep in bed wtih proper positioning and no increase c/o L shoulder pain to improve sleep/rest.     Baseline difficulty sleeping in bed   Time 4   Period Weeks   Status New               Plan - 09/16/15 1123    Clinical Impression Statement L shoulder PROM (in supine): flexion (122 deg.), abd. (108 deg.), ER: 65 deg., IR (46 deg.)- no sh. compenstation (pain tolerable).  Pt. is progressing with increase L shoulder PROM in a pain tolerable range.  Good L shoulder capsular mobility all planes.  Pt. will continue with current HEP until MD f/u to determine if progression to AAROM/ isometrics is appropriate.    Rehab Potential Good   PT Frequency 2x / week   PT Duration 4 weeks   PT Treatment/Interventions ADLs/Self Care Home Management;Aquatic Therapy;Therapeutic exercise;Therapeutic activities;Functional mobility training;Patient/family education;Manual techniques;Energy conservation;Passive range of motion;Scar mobilization   PT Next Visit Plan Discuss MD f/u/ progress HEP per MD protocol.   PT Home Exercise Plan See handouts   Consulted and Agree with Plan of Care Patient      Patient will benefit from skilled therapeutic intervention in order to improve the following deficits and impairments:   Hypomobility, Decreased activity tolerance, Decreased strength, Impaired UE functional use, Pain, Decreased mobility, Decreased range of motion, Improper body mechanics, Postural dysfunction, Impaired flexibility  Visit Diagnosis: Pain in left shoulder  Shoulder joint stiffness, left  Muscle weakness (generalized)     Problem List Patient Active Problem List   Diagnosis Date Noted  . Personal history of malignant neoplasm of breast 07/26/2014  .  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  . Lumbosacral spondylosis without myelopathy 04/30/2013  . Subacute lumbar radiculopathy 04/30/2013  . Chronic low back pain 02/19/2013  . 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 Trochanteric bursitis of right hip 04/11/2012  . Sacroiliac joint dysfunction of both sides 07/12/2011   Pura Spice, PT, DPT # [phone]   09/17/2015, 9:02 AM  [place] [facility] [address] [place], [place], [address] Phone: [phone]   Fax:  [phone]  Name: Andrea [name] MRN: [id number] Date of Birth: 1951-01-24

## 2015-09-18 ENCOUNTER — Ambulatory Visit: Payer: Medicare Other | Admitting: Physical Therapy

## 2015-09-18 DIAGNOSIS — M25612 Stiffness of left shoulder, not elsewhere classified: Secondary | ICD-10-CM

## 2015-09-18 DIAGNOSIS — M25512 Pain in left shoulder: Secondary | ICD-10-CM | POA: Diagnosis not present

## 2015-09-18 DIAGNOSIS — M6281 Muscle weakness (generalized): Secondary | ICD-10-CM

## 2015-09-19 NOTE — Therapy (Signed)
[facility] [address] [place], [place], [address] Phone: [phone]   Fax:  [phone]  Physical Therapy Treatment  Patient Details  Name: Andrea [name] MRN: [id number] Date of Birth: [birth year] Referring Provider: Dr. Mack Guise  Encounter Date: 09/18/2015      PT End of Session - 09/19/15 1357    Visit Number 6   Number of Visits 8   Date for PT Re-Evaluation 09/29/15   Authorization - Visit Number 6   Authorization - Number of Visits 10   PT Start Time 1108   PT Stop Time 1149   PT Time Calculation (min) 41 min   Activity Tolerance Patient tolerated treatment well;Patient limited by pain   Behavior During Therapy [facility] for tasks assessed/performed      Past Medical History  Diagnosis Date  . Disorders of sacrum   . Cervical post-laminectomy syndrome   . Cubital tunnel syndrome   . Unspecified musculoskeletal disorders and symptoms referable to neck     cervical/trapezius  . Lateral epicondylitis  of elbow   . Disturbance of skin sensation   . Late effects of acute poliomyelitis   . Hypertension   . Parkinson's disease ([place]) 2011  . Peripheral vascular disease ([place]) 2011    venous stasis   . Radiation 2008    BREAST CA  . Complication of anesthesia     itching real bad or msucles spasms  . 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 Breast cancer ([place]) 2008    RT LUMPECTOMY  . Breast cancer ([place]) 2011    RT MASTECTOMY    Past Surgical History  Procedure Laterality Date  . Spine surgery    . Shoulder arthroscopy w/ rotator cuff repair Bilateral [id number]  . Carpal tunnel release    . Abdominal hysterectomy  1997  . Colonoscopy  [id number]    Dr. Allen Norris, Dr. Vira Agar  . Elbow surgery Left 2011  . Hip surgery Right 06/19/12  . Shoulder surgery Left 2014  . Elbow surgery Left 2015  . Mastectomy Right 2011    BREAST CA  . Breast lumpectomy Right 2008    wide  excision, whole breast radiation  . Breast surgery Right December 04, 2009    Right simple mastectomy, sentinel node biopsy.  . Leg surgery Right 1958    Corrcetive surgery for polio  . Leg surgery Left 1958    corrective surgery for polio  . Toe fusion Left 1970    little toe fusion  . Shoulder arthroscopy with open rotator cuff repair Left 08/19/2015    Procedure: SHOULDER ARTHROSCOPY WITH  MINI OPEN ROTATOR CUFF REPAIR,SUBACROMIAL DECOMPRESSION, ARTHROSCOPIC BICEPS TENODESIS;  Surgeon: Thornton Park, MD;  Location: [facility] ORS;  Service: Orthopedics;  Laterality: Left;    There were no vitals filed for this visit.      Subjective Assessment - 09/19/15 1350    Subjective Pt. states MD appt. went well.  MD discharged use of sling with daily use and instructed only to wear when in crowds.  3/10 L shoulder pain currently and 5/10 pain this morning.  Pt. returns to MD on 6/14.     Limitations Writing;House hold activities;Other (comment);Lifting   Patient Stated Goals Increase L shoulder ROM/ strengthening.     Currently in Pain? Yes   Pain Score 3    Pain Location Shoulder   Pain Orientation Left   Pain Descriptors /  Indicators Aching   Pain Type Surgical pain      OBJECTIVE: There.ex.: See new HEP (supine wand AAROM).  B UBE 2 min. F/b/f AA/PROM (as tolerated/Warm-up).  Manual tx.:  Supine L shoulder AA/PROM (flexion/ abduction/ ER/ IR)- 20x. L shoulder AP/PA/inf. Grade II-III mobs. 3x20 sec. (no pain with good capsular mobility).  L shoulder/ UT STM.  Pt. Will ice at home.    Pt response for medical necessity: pt. Benefits from skilled PT services to increase L shoulder ROM/ strengthening per MD protocol. Pt. Is progressing well with AAROM and overall L shoulder mobility per MD protocol.          PT Education - 09/19/15 1357    Education provided Yes   Education Details Supine B shoulder AAROM with wand   Person(s) Educated Patient   Methods  Explanation;Demonstration;Handout   Comprehension Verbalized understanding;Returned demonstration             PT Long Term Goals - 09/02/15 1243    PT LONG TERM GOAL #1   Title Pt. I with HEP to increase L shoulder AA/PROM to WNL as compared to R shoulder in supine position to improve pain-free mobility.    Baseline R shoulder AROM WFL with pain reported at 150 deg. flexion (previous RTC surgery in 2012). Supine L shoulder PROM: flexion 98 deg., abd. 88 deg., ER 10 deg.   Time 4   Period Weeks   Status New   PT LONG TERM GOAL #2   Title Pt. will increase L grip strength to >40# to improve grasping/ household ADLs in pain tolerable range.     Baseline R 49.5# and L 33.4#   Time 4   Period Weeks   Status New   PT LONG TERM GOAL #3   Title Pt. able to progress to L shoulder AROM >120 deg. with no increase c/o pain in 4 weeks to promote return to PLOF.     Baseline No AROM at this time.    Time 4   Period Weeks   Status New   PT LONG TERM GOAL #4   Title Pt. able to sleep in bed wtih proper positioning and no increase c/o L shoulder pain to improve sleep/rest.     Baseline difficulty sleeping in bed   Time 4   Period Weeks   Status New               Plan - 09/19/15 1358    Clinical Impression Statement Pt. able to transition to more AAROM with use of wand in supine position.  Pt. continues to progress well with L shoulder ROM/ capsular mobility (all planes).  Decrease pain/ increase ROM with gentle distraction during AA/PROM.     PT Frequency 2x / week   PT Duration 4 weeks   PT Treatment/Interventions ADLs/Self Care Home Management;Aquatic Therapy;Therapeutic exercise;Therapeutic activities;Functional mobility training;Patient/family education;Manual techniques;Energy conservation;Passive range of motion;Scar mobilization   PT Next Visit Plan Progress HEP per MD protocol.   PT Home Exercise Plan See handouts   Consulted and Agree with Plan of Care Patient       Patient will benefit from skilled therapeutic intervention in order to improve the following deficits and impairments:  Hypomobility, Decreased activity tolerance, Decreased strength, Impaired UE functional use, Pain, Decreased mobility, Decreased range of motion, Improper body mechanics, Postural dysfunction, Impaired flexibility  Visit Diagnosis: Pain in left shoulder  Shoulder joint stiffness, left  Muscle weakness (generalized)     Problem List Patient  Active Problem List   Diagnosis Date Noted  . Personal history of malignant neoplasm of breast 07/26/2014  . 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  . Lumbosacral spondylosis without myelopathy 04/30/2013  . Subacute lumbar radiculopathy 04/30/2013  . Chronic low back pain 02/19/2013  . 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 Trochanteric bursitis of right hip 04/11/2012  . Sacroiliac joint dysfunction of both sides 07/12/2011   Pura Spice, PT, DPT # [phone]   09/19/2015, 2:13 PM  [facility] [address] [place], [place], [address] Phone: [phone]   Fax:  [phone]  Name: Andrea [name] MRN: [id number] Date of Birth: 08/28/1950

## 2015-09-23 ENCOUNTER — Ambulatory Visit: Payer: Medicare Other | Admitting: Physical Therapy

## 2015-09-23 DIAGNOSIS — M6281 Muscle weakness (generalized): Secondary | ICD-10-CM

## 2015-09-23 DIAGNOSIS — M25512 Pain in left shoulder: Secondary | ICD-10-CM | POA: Diagnosis not present

## 2015-09-23 DIAGNOSIS — M25612 Stiffness of left shoulder, not elsewhere classified: Secondary | ICD-10-CM

## 2015-09-24 ENCOUNTER — Encounter: Payer: Self-pay | Admitting: Physical Therapy

## 2015-09-24 NOTE — Therapy (Signed)
[facility] [address] [place], [place], [address] Phone: [phone]   Fax:  [phone]  Physical Therapy Treatment  Patient Details  Name: Andrea [name] MRN: [id number] Date of Birth: 09-Dec-1950 Referring Provider: Dr. Mack Guise  Encounter Date: 09/23/2015      PT End of Session - 09/24/15 2013    Visit Number 7   Number of Visits 8   Date for PT Re-Evaluation 09/29/15   Authorization - Visit Number 7   Authorization - Number of Visits 10   PT Start Time 1108   PT Stop Time 1153   PT Time Calculation (min) 45 min   Activity Tolerance Patient tolerated treatment well;Patient limited by pain   Behavior During Therapy [facility] for tasks assessed/performed      Past Medical History  Diagnosis Date  . Disorders of sacrum   . Cervical post-laminectomy syndrome   . Cubital tunnel syndrome   . Unspecified musculoskeletal disorders and symptoms referable to neck     cervical/trapezius  . Lateral epicondylitis  of elbow   . Disturbance of skin sensation   . Late effects of acute poliomyelitis   . Hypertension   . Parkinson's disease ([place]) 2011  . Peripheral vascular disease ([place]) 2011    venous stasis   . Radiation 2008    BREAST CA  . Complication of anesthesia     itching real bad or msucles spasms  . 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 Breast cancer ([place]) 2008    RT LUMPECTOMY  . Breast cancer ([place]) 2011    RT MASTECTOMY    Past Surgical History  Procedure Laterality Date  . Spine surgery    . Shoulder arthroscopy w/ rotator cuff repair Bilateral [id number]  . Carpal tunnel release    . Abdominal hysterectomy  1997  . Colonoscopy  [id number]    Dr. Allen Norris, Dr. Vira Agar  . Elbow surgery Left 2011  . Hip surgery Right 06/19/12  . Shoulder surgery Left 2014  . Elbow surgery Left 2015  . Mastectomy Right 2011    BREAST CA  . Breast lumpectomy Right 2008    wide  excision, whole breast radiation  . Breast surgery Right December 04, 2009    Right simple mastectomy, sentinel node biopsy.  . Leg surgery Right 1958    Corrcetive surgery for polio  . Leg surgery Left 1958    corrective surgery for polio  . Toe fusion Left 1970    little toe fusion  . Shoulder arthroscopy with open rotator cuff repair Left 08/19/2015    Procedure: SHOULDER ARTHROSCOPY WITH  MINI OPEN ROTATOR CUFF REPAIR,SUBACROMIAL DECOMPRESSION, ARTHROSCOPIC BICEPS TENODESIS;  Surgeon: Thornton Park, MD;  Location: [facility] ORS;  Service: Orthopedics;  Laterality: Left;    There were no vitals filed for this visit.      Subjective Assessment - 09/24/15 2011    Subjective Pt. remains compliant with AAROM and hasn't required use of sling.    Limitations Writing;House hold activities;Other (comment);Lifting   Patient Stated Goals Increase L shoulder ROM/ strengthening.     Currently in Pain? Yes   Pain Score 2    Pain Location Shoulder   Pain Orientation Left   Pain Descriptors / Indicators Aching   Pain Type Surgical pain      OBJECTIVE: There.ex.: B UBE 2 min. F/b/f AA/PROM (as tolerated/Warm-up).  Shoulder pulley warm-up 10x flexion/ abd.  Supine/ seated wand ex. (all planes) 20x each with PT assist as needed.  Manual tx.: Supine L shoulder AA/PROM (flexion/ abduction/ ER/ IR)- 20x. L shoulder AP/PA/inf. Grade II-III mobs. 3x20 sec. L shoulder/ UT STM. Pt. Will ice at home.    Pt response for medical necessity: pt. Benefits from skilled PT services to increase L shoulder ROM/ strengthening per MD protocol. Pt. Is progressing well with AAROM and overall L shoulder mobility per MD protocol. Decrease frequency to 1x/week.       PT Long Term Goals - 09/02/15 1243    PT LONG TERM GOAL #1   Title Pt. I with HEP to increase L shoulder AA/PROM to WNL as compared to R shoulder in supine position to improve pain-free mobility.    Baseline R shoulder AROM WFL with pain  reported at 150 deg. flexion (previous RTC surgery in 2012). Supine L shoulder PROM: flexion 98 deg., abd. 88 deg., ER 10 deg.   Time 4   Period Weeks   Status New   PT LONG TERM GOAL #2   Title Pt. will increase L grip strength to >40# to improve grasping/ household ADLs in pain tolerable range.     Baseline R 49.5# and L 33.4#   Time 4   Period Weeks   Status New   PT LONG TERM GOAL #3   Title Pt. able to progress to L shoulder AROM >120 deg. with no increase c/o pain in 4 weeks to promote return to PLOF.     Baseline No AROM at this time.    Time 4   Period Weeks   Status New   PT LONG TERM GOAL #4   Title Pt. able to sleep in bed wtih proper positioning and no increase c/o L shoulder pain to improve sleep/rest.     Baseline difficulty sleeping in bed   Time 4   Period Weeks   Status New            Plan - 09/24/15 2014    Clinical Impression Statement Increase L shoulder AAROM with PT assist or wand in supine position and able to progress to seated position with no increase c/o pain.  Pt. ambulates with increase L arm swing during R swing through phase of gait and able to relax more during PT manual tx./ overall session.  Pt./ PT agree to decrease PT frequency to 1x/week with HEP focus.       Rehab Potential Good   PT Frequency 1x / week   PT Duration 4 weeks   PT Treatment/Interventions ADLs/Self Care Home Management;Aquatic Therapy;Therapeutic exercise;Therapeutic activities;Functional mobility training;Patient/family education;Manual techniques;Energy conservation;Passive range of motion;Scar mobilization   PT Next Visit Plan Progress HEP per MD protocol.  RECERT next PT tx. session.    PT Home Exercise Plan See handouts   Consulted and Agree with Plan of Care Patient      Patient will benefit from skilled therapeutic intervention in order to improve the following deficits and impairments:  Hypomobility, Decreased activity tolerance, Decreased strength, Impaired UE  functional use, Pain, Decreased mobility, Decreased range of motion, Improper body mechanics, Postural dysfunction, Impaired flexibility  Visit Diagnosis: Pain in left shoulder  Shoulder joint stiffness, left  Muscle weakness (generalized)     Problem List Patient Active Problem List   Diagnosis Date Noted  . Personal history of malignant neoplasm of breast 07/26/2014  . 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  . Lumbosacral spondylosis without myelopathy 04/30/2013  . Subacute lumbar  radiculopathy 04/30/2013  . Chronic low back pain 02/19/2013  . 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 Trochanteric bursitis of right hip 04/11/2012  . Sacroiliac joint dysfunction of both sides 07/12/2011   Pura Spice, PT, DPT # [phone]   09/24/2015, 8:19 PM  [facility] [address] [place], [place], [address] Phone: [phone]   Fax:  [phone]  Name: Andrea [name] MRN: [id number] Date of Birth: 1950-11-09

## 2015-09-25 ENCOUNTER — Ambulatory Visit: Payer: Self-pay | Admitting: Obstetrics and Gynecology

## 2015-09-25 ENCOUNTER — Encounter: Payer: Medicare Other | Admitting: Physical Therapy

## 2015-10-01 ENCOUNTER — Encounter: Payer: Self-pay | Admitting: Physical Therapy

## 2015-10-01 ENCOUNTER — Ambulatory Visit: Payer: Medicare Other | Admitting: Physical Therapy

## 2015-10-01 DIAGNOSIS — M6281 Muscle weakness (generalized): Secondary | ICD-10-CM

## 2015-10-01 DIAGNOSIS — M25512 Pain in left shoulder: Secondary | ICD-10-CM

## 2015-10-01 DIAGNOSIS — M25612 Stiffness of left shoulder, not elsewhere classified: Secondary | ICD-10-CM

## 2015-10-01 NOTE — Therapy (Addendum)
[facility] [address] [place], [place], [address] Phone: [phone]   Fax:  [phone]  Physical Therapy Treatment  Patient Details  Name: Andrea [name] MRN: [id number] Date of Birth: Oct 06, 1950 Referring Provider: Dr. Mack Guise  Encounter Date: 10/01/2015      PT End of Session - 10/10/15 1921    Visit Number 9   Number of Visits 16   Date for PT Re-Evaluation 10/29/15   Authorization - Visit Number 9   Authorization - Number of Visits 18   PT Start Time 1020   PT Stop Time 1106   PT Time Calculation (min) 46 min   Equipment Utilized During Treatment Other (comment)   Activity Tolerance Patient tolerated treatment well;Patient limited by pain   Behavior During Therapy [facility] for tasks assessed/performed      Past Medical History  Diagnosis Date  . Disorders of sacrum   . Cervical post-laminectomy syndrome   . Cubital tunnel syndrome   . Unspecified musculoskeletal disorders and symptoms referable to neck     cervical/trapezius  . Lateral epicondylitis  of elbow   . Disturbance of skin sensation   . Late effects of acute poliomyelitis   . Hypertension   . Parkinson's disease ([place]) 2011  . Peripheral vascular disease ([place]) 2011    venous stasis   . Radiation 2008    BREAST CA  . Complication of anesthesia     itching real bad or msucles spasms  . Cancer 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 Breast cancer ([place]) 2008    RT LUMPECTOMY  . Breast cancer ([place]) 2011    RT MASTECTOMY    Past Surgical History  Procedure Laterality Date  . Spine surgery    . Shoulder arthroscopy w/ rotator cuff repair Bilateral [id number]  . Carpal tunnel release    . Abdominal hysterectomy  1997  . Colonoscopy  [id number]    Dr. Allen Norris, Dr. Vira Agar  . Elbow surgery Left 2011  . Hip surgery Right 06/19/12  . Shoulder surgery Left 2014  . Elbow surgery Left 2015  . Mastectomy Right 2011   BREAST CA  . Breast lumpectomy Right 2008    wide excision, whole breast radiation  . Breast surgery Right December 04, 2009    Right simple mastectomy, sentinel node biopsy.  . Leg surgery Right 1958    Corrcetive surgery for polio  . Leg surgery Left 1958    corrective surgery for polio  . Toe fusion Left 1970    little toe fusion  . Shoulder arthroscopy with open rotator cuff repair Left 08/19/2015    Procedure: SHOULDER ARTHROSCOPY WITH  MINI OPEN ROTATOR CUFF REPAIR,SUBACROMIAL DECOMPRESSION, ARTHROSCOPIC BICEPS TENODESIS;  Surgeon: Thornton Park, MD;  Location: [facility] ORS;  Service: Orthopedics;  Laterality: Left;    There were no vitals filed for this visit.      Subjective Assessment - 10/10/15 1919    Subjective Pt. states her L shoulder is hurting more today due to a recent fall.  Pt. states her leg brace was not locked and she fell towards L side/shoulder.     Limitations Writing;House hold activities;Other (comment);Lifting   Patient Stated Goals Increase L shoulder ROM/ strengthening.     Currently in Pain? Yes   Pain Score 3    Pain Location Shoulder   Pain Orientation Left   Pain Descriptors / Indicators Aching   Pain Type  Surgical pain     QuickDASH: 34%  OBJECTIVE: There.ex.: B UBE 3 min. F/b AAROM (as tolerated/Warm-up). Shoulder pulley warm-up 10x flexion/ abd. Standing wall ladder flexion/ abd. 10x each (as tolerated).  Seated/ standing wand ex. (all planes) 10x each with PT assist as needed (no increase c/o pain). Standing ball A/AROM at wall 10x with static holds as tolerated.  Discussed RTB ex. (scap. Retraction/ sh. Ext.).     Pt response for medical necessity: pt. Benefits from skilled PT services to increase L shoulder ROM/ strengthening per MD protocol. Pt. Is progressing well with AAROM and overall L shoulder mobility per MD protocol. Decrease frequency to 1x/week.        PT Long Term Goals - 10/02/15 2026    PT LONG TERM GOAL #1    Title Pt. I with HEP to increase L shoulder AA/PROM to WNL as compared to R shoulder in supine position to improve pain-free mobility.    Time 4   Period Weeks   Status Partially Met   PT LONG TERM GOAL #2   Title Pt. will increase L grip strength to >40# to improve grasping/ household ADLs in pain tolerable range.     Baseline L 38#, R 54#   Time 4   Period Weeks   Status Partially Met   PT LONG TERM GOAL #3   Title Pt. able to progress to L shoulder AROM >120 deg. with no increase c/o pain in 4 weeks to promote return to PLOF.     Baseline L shoulder flexion: 124 deg., abd. 122 deg., ER 70 deg., ER 70 deg.     Period Weeks   Status Achieved   PT LONG TERM GOAL #4   Title Pt. able to sleep in bed wtih proper positioning and no increase c/o L shoulder pain to improve sleep/rest.     Baseline difficulty sleeping in bed   Time 4   Period Weeks   Status Partially Met   PT LONG TERM GOAL #5   Title Pt. will increase L shoulder AROM to [facility] as compared to R shoulder to imporove pain-free mobility.    Time 4   Period Weeks   Status New               Plan - 10/10/15 1924    Clinical Impression Statement Pt. continues to progress well with L shoulder AAROM with use of wand/ R UE.  Pain limited with overhead/ abd. movement pattern but no impingement noted.  Minimal tenderness noted with palpaiton.  Moderate muscle weakenss noted during L shoulder eccentric muscle control.     Rehab Potential Good   PT Frequency 1x / week   PT Duration 4 weeks   PT Next Visit Plan Progress to AROM/ isometrics next tx. session.    PT Home Exercise Plan See handouts      Patient will benefit from skilled therapeutic intervention in order to improve the following deficits and impairments:     Visit Diagnosis: Pain in left shoulder  Shoulder joint stiffness, left  Muscle weakness (generalized)     Problem List Patient Active Problem List   Diagnosis Date Noted  . Personal history of  malignant neoplasm of breast 07/26/2014  . 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  . Lumbosacral spondylosis without myelopathy 04/30/2013  . Subacute lumbar radiculopathy 04/30/2013  . Chronic low back pain 02/19/2013  . 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 Trochanteric bursitis of right hip 04/11/2012  . Sacroiliac joint dysfunction  of both sides 07/12/2011   Pura Spice, PT, DPT # [phone]   10/10/2015, 8:30 PM  [facility] [address] [place], [place], [address] Phone: [phone]   Fax:  [phone]  Name: Andrea [name] MRN: [id number] Date of Birth: Mar 11, 1951

## 2015-10-09 ENCOUNTER — Ambulatory Visit: Payer: Medicare Other | Attending: Orthopedic Surgery | Admitting: Physical Therapy

## 2015-10-09 ENCOUNTER — Encounter: Payer: Self-pay | Admitting: Physical Therapy

## 2015-10-09 DIAGNOSIS — M6281 Muscle weakness (generalized): Secondary | ICD-10-CM | POA: Insufficient documentation

## 2015-10-09 DIAGNOSIS — M25612 Stiffness of left shoulder, not elsewhere classified: Secondary | ICD-10-CM | POA: Insufficient documentation

## 2015-10-09 DIAGNOSIS — M25512 Pain in left shoulder: Secondary | ICD-10-CM | POA: Insufficient documentation

## 2015-10-10 NOTE — Addendum Note (Signed)
Addended by: Pura Spice on: 10/10/2015 08:36 PM   Modules accepted: Orders

## 2015-10-10 NOTE — Therapy (Signed)
[facility] [address]. [place], [place], [address] Phone: [phone]   Fax:  [phone]  Physical Therapy Treatment  Patient Details  Name: Andrea [name] MRN: [id number] Date of Birth: 1950/08/31 Referring Provider: Dr. Mack Guise  Encounter Date: 10/09/2015      PT End of Session - 10/10/15 1921    Visit Number 9   Number of Visits 16   Date for PT Re-Evaluation 10/29/15   Authorization - Visit Number 9   Authorization - Number of Visits 18   PT Start Time 1020   PT Stop Time 1106   PT Time Calculation (min) 46 min   Equipment Utilized During Treatment Other (comment)   Activity Tolerance Patient tolerated treatment well;Patient limited by pain   Behavior During Therapy [facility] for tasks assessed/performed      Past Medical History  Diagnosis Date  . Disorders of sacrum   . Cervical post-laminectomy syndrome   . Cubital tunnel syndrome   . Unspecified musculoskeletal disorders and symptoms referable to neck     cervical/trapezius  . Lateral epicondylitis  of elbow   . Disturbance of skin sensation   . Late effects of acute poliomyelitis   . Hypertension   . Parkinson's disease ([place]) 2011  . Peripheral vascular disease ([place]) 2011    venous stasis   . Radiation 2008    BREAST CA  . Complication of anesthesia     itching real bad or msucles spasms  . 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 Breast cancer ([place]) 2008    RT LUMPECTOMY  . Breast cancer ([place]) 2011    RT MASTECTOMY    Past Surgical History  Procedure Laterality Date  . Spine surgery    . Shoulder arthroscopy w/ rotator cuff repair Bilateral [id number]  . Carpal tunnel release    . Abdominal hysterectomy  1997  . Colonoscopy  [id number]    Dr. Allen Norris, Dr. Vira Agar  . Elbow surgery Left 2011  . Hip surgery Right 06/19/12  . Shoulder surgery Left 2014  . Elbow surgery Left 2015  . Mastectomy Right 2011     BREAST CA  . Breast lumpectomy Right 2008    wide excision, whole breast radiation  . Breast surgery Right December 04, 2009    Right simple mastectomy, sentinel node biopsy.  . Leg surgery Right 1958    Corrcetive surgery for polio  . Leg surgery Left 1958    corrective surgery for polio  . Toe fusion Left 1970    little toe fusion  . Shoulder arthroscopy with open rotator cuff repair Left 08/19/2015    Procedure: SHOULDER ARTHROSCOPY WITH  MINI OPEN ROTATOR CUFF REPAIR,SUBACROMIAL DECOMPRESSION, ARTHROSCOPIC BICEPS TENODESIS;  Surgeon: Thornton Park, MD;  Location: [facility] ORS;  Service: Orthopedics;  Laterality: Left;    There were no vitals filed for this visit.      Subjective Assessment - 10/10/15 1919    Subjective Pt. states her L shoulder is hurting more today due to a recent fall.  Pt. states her leg brace was not locked and she fell towards L side/shoulder.     Limitations Writing;House hold activities;Other (comment);Lifting   Patient Stated Goals Increase L shoulder ROM/ strengthening.     Currently in Pain? Yes   Pain Score 3    Pain Location Shoulder   Pain Orientation Left   Pain Descriptors / Indicators Aching  Pain Type Surgical pain      OBJECTIVE: There.ex.: B UBE 3 min. F/b AAROM (as tolerated/Warm-up). Seated/ standing wand ex. (all planes) 10x each with PT assist as needed (no increase c/o pain). Supine R shoulder serratus punches 10x2 AROM/ seated scap. Retraction.  Reviewed HEP.  Manual tx.: R shoulder PROM to tolerable end-range.  R shoulder AP/PA/inf. Grade II-III mobs. 5x20 sec.       Pt response for medical necessity: pt. Benefits from skilled PT services to increase L shoulder ROM/ strengthening per MD protocol. Pt. Is progressing well with AAROM and overall L shoulder mobility per MD protocol. Decrease frequency to 1x/week. Start AROM/ isometrics next tx. Session.        PT Long Term Goals - 09/02/15 1243    PT LONG TERM GOAL #1    Title Pt. I with HEP to increase L shoulder AA/PROM to WNL as compared to R shoulder in supine position to improve pain-free mobility.    Baseline R shoulder AROM WFL with pain reported at 150 deg. flexion (previous RTC surgery in 2012). Supine L shoulder PROM: flexion 98 deg., abd. 88 deg., ER 10 deg.   Time 4   Period Weeks   Status New   PT LONG TERM GOAL #2   Title Pt. will increase L grip strength to >40# to improve grasping/ household ADLs in pain tolerable range.     Baseline R 49.5# and L 33.4#   Time 4   Period Weeks   Status New   PT LONG TERM GOAL #3   Title Pt. able to progress to L shoulder AROM >120 deg. with no increase c/o pain in 4 weeks to promote return to PLOF.     Baseline No AROM at this time.    Time 4   Period Weeks   Status New   PT LONG TERM GOAL #4   Title Pt. able to sleep in bed wtih proper positioning and no increase c/o L shoulder pain to improve sleep/rest.     Baseline difficulty sleeping in bed   Time 4   Period Weeks   Status New               Plan - 10/10/15 1924    Clinical Impression Statement Pt. continues to progress well with L shoulder AAROM with use of wand/ R UE.  Pain limited with overhead/ abd. movement pattern but no impingement noted.  Minimal tenderness noted with palpaiton.  Moderate muscle weakenss noted during L shoulder eccentric muscle control.     Rehab Potential Good   PT Frequency 1x / week   PT Duration 4 weeks   PT Next Visit Plan Progress to AROM/ isometrics next tx. session.    PT Home Exercise Plan See handouts      Patient will benefit from skilled therapeutic intervention in order to improve the following deficits and impairments:  Hypomobility, Decreased activity tolerance, Decreased strength, Impaired UE functional use, Pain, Decreased mobility, Decreased range of motion, Improper body mechanics, Postural dysfunction, Impaired flexibility  Visit Diagnosis: Pain in left shoulder  Shoulder joint  stiffness, left  Muscle weakness (generalized)     Problem List Patient Active Problem List   Diagnosis Date Noted  . Personal history of malignant neoplasm of breast 07/26/2014  . 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  . Lumbosacral spondylosis without myelopathy 04/30/2013  . Subacute lumbar radiculopathy 04/30/2013  . Chronic low back pain 02/19/2013  . 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 Trochanteric  bursitis of right hip 04/11/2012  . Sacroiliac joint dysfunction of both sides 07/12/2011   Pura Spice, PT, DPT # [phone]   10/10/2015, 7:30 PM  [facility] [address] [place], [place], [address] Phone: [phone]   Fax:  [phone]  Name: Andrea [name] MRN: [id number] Date of Birth: 1951-02-23

## 2015-10-16 ENCOUNTER — Ambulatory Visit: Payer: Medicare Other

## 2015-10-16 ENCOUNTER — Encounter: Payer: Self-pay | Admitting: Physical Therapy

## 2015-10-16 DIAGNOSIS — M25612 Stiffness of left shoulder, not elsewhere classified: Secondary | ICD-10-CM

## 2015-10-16 DIAGNOSIS — M6281 Muscle weakness (generalized): Secondary | ICD-10-CM

## 2015-10-16 DIAGNOSIS — M25512 Pain in left shoulder: Secondary | ICD-10-CM

## 2015-10-16 NOTE — Therapy (Signed)
[facility] [address] [place], [place], [address] Phone: [phone]   Fax:  [phone]  Physical Therapy Treatment  Patient Details  Name: Andrea [name] MRN: [id number] Date of Birth: [birth year] Referring Provider: Dr. Mack Guise  Encounter Date: 10/16/2015      PT End of Session - 10/16/15 1036    Visit Number 10   Number of Visits 16   Date for PT Re-Evaluation 10/29/15   Authorization - Visit Number 10   Authorization - Number of Visits 18   PT Start Time 1497   PT Stop Time 1115   PT Time Calculation (min) 45 min   Equipment Utilized During Treatment --   Activity Tolerance Patient tolerated treatment well   Behavior During Therapy [facility] for tasks assessed/performed      Past Medical History  Diagnosis Date  . Disorders of sacrum   . Cervical post-laminectomy syndrome   . Cubital tunnel syndrome   . Unspecified musculoskeletal disorders and symptoms referable to neck     cervical/trapezius  . Lateral epicondylitis  of elbow   . Disturbance of skin sensation   . Late effects of acute poliomyelitis   . Hypertension   . Parkinson's disease ([place]) 2011  . Peripheral vascular disease ([place]) 2011    venous stasis   . Radiation 2008    BREAST CA  . Complication of anesthesia     itching real bad or msucles spasms  . 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 Breast cancer ([place]) 2008    RT LUMPECTOMY  . Breast cancer ([place]) 2011    RT MASTECTOMY    Past Surgical History  Procedure Laterality Date  . Spine surgery    . Shoulder arthroscopy w/ rotator cuff repair Bilateral [id number]  . Carpal tunnel release    . Abdominal hysterectomy  1997  . Colonoscopy  [id number]    Dr. Allen Norris, Dr. Vira Agar  . Elbow surgery Left 2011  . Hip surgery Right 06/19/12  . Shoulder surgery Left 2014  . Elbow surgery Left 2015  . Mastectomy Right 2011    BREAST CA  . Breast lumpectomy  Right 2008    wide excision, whole breast radiation  . Breast surgery Right December 04, 2009    Right simple mastectomy, sentinel node biopsy.  . Leg surgery Right 1958    Corrcetive surgery for polio  . Leg surgery Left 1958    corrective surgery for polio  . Toe fusion Left 1970    little toe fusion  . Shoulder arthroscopy with open rotator cuff repair Left 08/19/2015    Procedure: SHOULDER ARTHROSCOPY WITH  MINI OPEN ROTATOR CUFF REPAIR,SUBACROMIAL DECOMPRESSION, ARTHROSCOPIC BICEPS TENODESIS;  Surgeon: Thornton Park, MD;  Location: [facility] ORS;  Service: Orthopedics;  Laterality: Left;    There were no vitals filed for this visit.      Subjective Assessment - 10/16/15 1035    Subjective Pt states she is dong well on this date. She denies pain at rest upon arrival. No further falls since last therapy session. No specific questions or concerns currently.    Limitations Writing;House hold activities;Other (comment);Lifting   Patient Stated Goals Increase L shoulder ROM/ strengthening.     Currently in Pain? No/denies      TREATMENT  There Ex:   B UBE 2 min each F/B AAROM (as tolerated/Warm-up).   Supine AAROM wands for L shoulder flexion, scaption  and ER, pt denies pain throughout motion and demonstrates good technique without compensation; Supine AROM L shoulder flexion x 10, cues to avoid shoulder hiking (issued for HEP); L shoulder isometrics for flexion, abduction, extension, adduction, IR, and ER. 5 second hold x 5 reps in each direction (issued as HEP); Supine L shoulder serratus punches 2 x 10; Seated low rows RTB 2 x 10; Standing L shoulder RTB resisted extension 2 x 10; Reviewed HEP and issued new exercises. Provided handout; CP applied to L shoulder at end of session x 5 minutes (unbilled);  Manual tx.:  L shoulder PROM to tolerable end-range which appears grossly full (possibly slight decrease in ER). Painless in all planes without restriction or guarding noted; L  shoulder AP/inf. Grade II mobs. 3 x 30 sec.                         PT Education - 10/16/15 1036    Education provided Yes   Education Details Shoulder isometrics   Person(s) Educated Patient   Methods Explanation;Demonstration;Handout   Comprehension Verbalized understanding;Returned demonstration             PT Long Term Goals - 10/02/15 2026    PT LONG TERM GOAL #1   Title Pt. I with HEP to increase L shoulder AA/PROM to WNL as compared to R shoulder in supine position to improve pain-free mobility.    Time 4   Period Weeks   Status Partially Met   PT LONG TERM GOAL #2   Title Pt. will increase L grip strength to >40# to improve grasping/ household ADLs in pain tolerable range.     Baseline L 38#, R 54#   Time 4   Period Weeks   Status Partially Met   PT LONG TERM GOAL #3   Title Pt. able to progress to L shoulder AROM >120 deg. with no increase c/o pain in 4 weeks to promote return to PLOF.     Baseline L shoulder flexion: 124 deg., abd. 122 deg., ER 70 deg., ER 70 deg.     Period Weeks   Status Achieved   PT LONG TERM GOAL #4   Title Pt. able to sleep in bed wtih proper positioning and no increase c/o L shoulder pain to improve sleep/rest.     Baseline difficulty sleeping in bed   Time 4   Period Weeks   Status Partially Met   PT LONG TERM GOAL #5   Title Pt. will increase L shoulder AROM to [facility] as compared to R shoulder to imporove pain-free mobility.    Time 4   Period Weeks   Status New               Plan - 10/16/15 1038    Clinical Impression Statement Pt reports no pain today with any AAROM, AROM, or PROM of L shoulder. She believes that the Meloxicam has helped with pain. She demonstrates good muscle activation with L shoulder isometrics and these were added to HEP. Pt encouraged to continue additional HEP and follow-up as scheduled.    Rehab Potential Good   PT Frequency 1x / week   PT Duration 4 weeks   PT  Treatment/Interventions ADLs/Self Care Home Management;Aquatic Therapy;Therapeutic exercise;Therapeutic activities;Functional mobility training;Patient/family education;Manual techniques;Energy conservation;Passive range of motion;Scar mobilization   PT Next Visit Plan Continue L shoulder AROM and isometrics   PT Home Exercise Plan See handouts, added L shoulder isometrics on this date  Consulted and Agree with Plan of Care Patient      Patient will benefit from skilled therapeutic intervention in order to improve the following deficits and impairments:  Hypomobility, Decreased activity tolerance, Decreased strength, Impaired UE functional use, Pain, Decreased mobility, Decreased range of motion, Improper body mechanics, Postural dysfunction, Impaired flexibility  Visit Diagnosis: Pain in left shoulder  Shoulder joint stiffness, left  Muscle weakness (generalized)     Problem List Patient Active Problem List   Diagnosis Date Noted  . Personal history of malignant neoplasm of breast 07/26/2014  . 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  . Lumbosacral spondylosis without myelopathy 04/30/2013  . Subacute lumbar radiculopathy 04/30/2013  . Chronic low back pain 02/19/2013  . 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 Trochanteric bursitis of right hip 04/11/2012  . Sacroiliac joint dysfunction of both sides 07/12/2011   Phillips Grout PT, DPT   Huprich,Jason 10/16/2015, 11:44 AM  [facility] [address] [place], [place], [address] Phone: [phone]   Fax:  [phone]  Name: Andrea [name] MRN: [id number] Date of Birth: 05-10-1950

## 2015-10-16 NOTE — Patient Instructions (Addendum)
Strengthening: Isometric flexion    Using wall for resistance, press right fist into ball using light pressure. Hold __5__ seconds. Repeat __5__ times per set. Do _1___ sets per session. Do __2__ sessions per day.    Strengthening: Isometric Abduction    Using wall for resistance, press left arm into ball using light pressure. Hold _5___ seconds. Repeat _5___ times per set. Do _1___ sets per session. Do _2___ sessions per day.   Strengthening: Isometric Adduction    Using body for resistance, gently press right arm into ball using light pressure. Hold _5___ seconds. Repeat __5__ times per set. Do __1__ sets per session. Do _2___ sessions per day.    Extension (Isometric)    Place left bent elbow and back of arm against wall. Press elbow against wall. Hold _5___ seconds. Repeat 5 times per set. Do 1 set per session. Do 2 sessions per day.    External Rotation (Isometric)    Place back of left fist against door frame, with elbow bent. Press fist against door frame. Hold 5 seconds. Repeat 5 times. Do 1 set per session. Do 2 sessions per day.    Internal Rotation (Isometric)    Place palm of right fist against door frame, with elbow bent. Press fist against door frame. Hold 5 seconds. Repeat 5 times. Do 1 set per session. Do 2 sessions per day.

## 2015-10-23 ENCOUNTER — Encounter: Payer: Self-pay | Admitting: Physical Therapy

## 2015-10-23 ENCOUNTER — Ambulatory Visit: Payer: Medicare Other | Admitting: Physical Therapy

## 2015-10-23 DIAGNOSIS — M6281 Muscle weakness (generalized): Secondary | ICD-10-CM

## 2015-10-23 DIAGNOSIS — M25512 Pain in left shoulder: Secondary | ICD-10-CM | POA: Diagnosis not present

## 2015-10-23 DIAGNOSIS — M25612 Stiffness of left shoulder, not elsewhere classified: Secondary | ICD-10-CM

## 2015-10-23 NOTE — Therapy (Signed)
[facility] [address] [place], [place], [address] Phone: [phone]   Fax:  [phone]  Physical Therapy Treatment  Patient Details  Name: Andrea [name] MRN: [id number] Date of Birth: January 30, 1951 Referring Provider: Dr. Martha Clan  Encounter Date: 10/23/2015      PT End of Session - 10/23/15 1642    Visit Number 11   Number of Visits 16   Date for PT Re-Evaluation 10/29/15   Authorization - Visit Number 11   Authorization - Number of Visits 18   PT Start Time 1013   PT Stop Time 1110   PT Time Calculation (min) 57 min   Equipment Utilized During Treatment Other (comment)   Activity Tolerance Patient tolerated treatment well   Behavior During Therapy [facility] for tasks assessed/performed      Past Medical History  Diagnosis Date  . Disorders of sacrum   . Cervical post-laminectomy syndrome   . Cubital tunnel syndrome   . Unspecified musculoskeletal disorders and symptoms referable to neck     cervical/trapezius  . Lateral epicondylitis  of elbow   . Disturbance of skin sensation   . Late effects of acute poliomyelitis   . Hypertension   . Parkinson's disease ([facility]) 2011  . Peripheral vascular disease ([facility]) 2011    venous stasis   . Radiation 2008    BREAST CA  . Complication of anesthesia     itching real bad or msucles spasms  . 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 Breast cancer ([facility]) 2008    RT LUMPECTOMY  . Breast cancer ([facility]) 2011    RT MASTECTOMY    Past Surgical History  Procedure Laterality Date  . Spine surgery    . Shoulder arthroscopy w/ rotator cuff repair Bilateral [id number]  . Carpal tunnel release    . Abdominal hysterectomy  1997  . Colonoscopy  [id number]    Dr. Servando Snare, Dr. Mechele Collin  . Elbow surgery Left 2011  . Hip surgery Right 06/19/12  . Shoulder surgery Left 2014  . Elbow surgery Left 2015  . Mastectomy Right 2011    BREAST CA  . Breast  lumpectomy Right 2008    wide excision, whole breast radiation  . Breast surgery Right December 04, 2009    Right simple mastectomy, sentinel node biopsy.  . Leg surgery Right 1958    Corrcetive surgery for polio  . Leg surgery Left 1958    corrective surgery for polio  . Toe fusion Left 1970    little toe fusion  . Shoulder arthroscopy with open rotator cuff repair Left 08/19/2015    Procedure: SHOULDER ARTHROSCOPY WITH  MINI OPEN ROTATOR CUFF REPAIR,SUBACROMIAL DECOMPRESSION, ARTHROSCOPIC BICEPS TENODESIS;  Surgeon: Juanell Fairly, MD;  Location: [facility] ORS;  Service: Orthopedics;  Laterality: Left;    There were no vitals filed for this visit.      Subjective Assessment - 10/23/15 1638    Subjective Pt. reports no new complaints.  Pt. continues to remain compliant with HEP.  No pain reported.     Limitations Writing;House hold activities;Other (comment);Lifting   Patient Stated Goals Increase L shoulder ROM/ strengthening.     Currently in Pain? No/denies        TREATMENT  There Ex:  B UBE 3 min each F/B AAROM (as tolerated/Warm-up).  Standing shoulder AROM all planes (reassessment to R shoulder WFL).   Seated L shoulder isometrics for flexion,  abduction, extension, adduction, IR, and ER. 5 second hold x 5 reps in each direction (reviewed isometric HEP); Standing ball shoulder flexion with added bouncing 10x (fatigue noted). Seated/ standing 3# bicep curls/ press-ups/ punches (2# on L), tricep ext./ scap. Retraction 20x each.   Discussed overhead reaching tasks at home/ instructed pt. To contact PT if any issues.         PT Long Term Goals - 2015-11-02 1050    PT LONG TERM GOAL #1   Title Pt. I with HEP to increase L shoulder AA/PROM to WNL as compared to R shoulder in supine position to improve pain-free mobility.    Time 4   Period Weeks   Status Achieved   PT LONG TERM GOAL #2   Title Pt. will increase L grip strength to >40# to improve grasping/ household ADLs in pain  tolerable range.     Baseline L 46.2#, R 54#   Time 4   Period Weeks   Status Achieved   PT LONG TERM GOAL #3   Title Pt. able to progress to L shoulder AROM >120 deg. with no increase c/o pain in 4 weeks to promote return to PLOF.     Baseline L shoulder flexion: 124 deg., abd. 122 deg., ER 70 deg., ER 70 deg.     Time 4   Period Weeks   Status Achieved   PT LONG TERM GOAL #4   Title Pt. able to sleep in bed wtih proper positioning and no increase c/o L shoulder pain to improve sleep/rest.     Time 4   Period Weeks   Status Partially Met   PT LONG TERM GOAL #5   Title Pt. will increase L shoulder AROM to [facility] as compared to R shoulder to imporove pain-free mobility.    Time 4   Period Weeks   Status Achieved            Plan - 02-Nov-2015 1643    Clinical Impression Statement Pt. has met all PT goals at this time and is independent with HEP.  Pt. instructed to contact PT if any issues in future with shoulder or ex. program.  Discharge from PT at this time.     Rehab Potential Good   PT Frequency 1x / week   PT Duration 4 weeks   PT Treatment/Interventions ADLs/Self Care Home Management;Aquatic Therapy;Therapeutic exercise;Therapeutic activities;Functional mobility training;Patient/family education;Manual techniques;Energy conservation;Passive range of motion;Scar mobilization   PT Next Visit Plan Discharge   PT Home Exercise Plan See handouts, added L shoulder isometrics on this date   Recommended Other Services Silver Sneakers   Consulted and Agree with Plan of Care Patient      Patient will benefit from skilled therapeutic intervention in order to improve the following deficits and impairments:  Hypomobility, Decreased activity tolerance, Decreased strength, Impaired UE functional use, Pain, Decreased mobility, Decreased range of motion, Improper body mechanics, Postural dysfunction, Impaired flexibility  Visit Diagnosis: Pain in left shoulder  Shoulder joint stiffness,  left  Muscle weakness (generalized)       G-Codes - 11-02-15 1645    Functional Assessment Tool Used Clinical impression/ pain/ ROM/ muscle weakness   Functional Limitation Carrying, moving and handling objects   Carrying, Moving and Handling Objects Current Status (E4235) At least 20 percent but less than 40 percent impaired, limited or restricted   Carrying, Moving and Handling Objects Goal Status (T6144) At least 20 percent but less than 40 percent impaired, limited or restricted  Carrying, Moving and Handling Objects Discharge Status [phone]) At least 20 percent but less than 40 percent impaired, limited or restricted      Problem List Patient Active Problem List   Diagnosis Date Noted  . Personal history of malignant neoplasm of breast 07/26/2014  . 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  . Lumbosacral spondylosis without myelopathy 04/30/2013  . Subacute lumbar radiculopathy 04/30/2013  . Chronic low back pain 02/19/2013  . 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 Trochanteric bursitis of right hip 04/11/2012  . Sacroiliac joint dysfunction of both sides 07/12/2011   Pura Spice, PT, DPT # [phone]   10/23/2015, 4:46 PM  [facility] [address] [place], [place], [address] Phone: [phone]   Fax:  [phone]  Name: MARJI KUEHNEL MRN: [id number] Date of Birth: 11-Jul-1950

## 2015-11-05 ENCOUNTER — Ambulatory Visit: Payer: Medicare Other | Admitting: Physical Therapy

## 2016-05-25 ENCOUNTER — Telehealth: Payer: Self-pay | Admitting: *Deleted

## 2016-05-25 DIAGNOSIS — Z87891 Personal history of nicotine dependence: Secondary | ICD-10-CM

## 2016-05-25 NOTE — Telephone Encounter (Signed)
Received call from patient for initial lung cancer screening scan. Obtained smoking history,(current, 1.25ppd x 45 years) as well as answering questions related to screening process. Patient denies signs of lung cancer such as weight loss or hemoptysis. Patient denies comorbidity that would prevent curative treatment if lung cancer were found. Patient is tentatively scheduled for shared decision making visit and CT scan on 06/08/16 at 1:30pm, pending insurance approval from business office.

## 2016-05-27 ENCOUNTER — Other Ambulatory Visit: Payer: Self-pay

## 2016-05-27 DIAGNOSIS — Z1231 Encounter for screening mammogram for malignant neoplasm of breast: Secondary | ICD-10-CM

## 2016-06-08 ENCOUNTER — Ambulatory Visit
Admission: RE | Admit: 2016-06-08 | Discharge: 2016-06-08 | Disposition: A | Discharge status: Home / Self Care | Payer: Medicare Other | Source: Ambulatory Visit | Attending: Oncology | Admitting: Oncology

## 2016-06-08 ENCOUNTER — Encounter: Payer: Self-pay | Admitting: Oncology

## 2016-06-08 ENCOUNTER — Inpatient Hospital Stay: Payer: Medicare Other | Attending: Oncology | Admitting: Oncology

## 2016-06-08 DIAGNOSIS — Z122 Encounter for screening for malignant neoplasm of respiratory organs: Secondary | ICD-10-CM

## 2016-06-08 DIAGNOSIS — I700 Atherosclerosis of aorta: Secondary | ICD-10-CM | POA: Diagnosis not present

## 2016-06-08 DIAGNOSIS — Z87891 Personal history of nicotine dependence: Secondary | ICD-10-CM | POA: Diagnosis present

## 2016-06-08 DIAGNOSIS — F17210 Nicotine dependence, cigarettes, uncomplicated: Secondary | ICD-10-CM

## 2016-06-13 DIAGNOSIS — Z87891 Personal history of nicotine dependence: Secondary | ICD-10-CM | POA: Insufficient documentation

## 2016-06-13 NOTE — Progress Notes (Signed)
In accordance with CMS guidelines, patient has met eligibility criteria including age, absence of signs or symptoms of lung cancer.  Social History  Substance Use Topics  . Smoking status: Current Every Day Smoker    Packs/day: 1.25    Years: 45.00    Types: Cigarettes  . Smokeless tobacco: Never Used  . Alcohol use No     Comment: No alcohol in the last 1.5 years     A shared decision-making session was conducted prior to the performance of CT scan. This includes one or more decision aids, includes benefits and harms of screening, follow-up diagnostic testing, over-diagnosis, false positive rate, and total radiation exposure.  Counseling on the importance of adherence to annual lung cancer LDCT screening, impact of co-morbidities, and ability or willingness to undergo diagnosis and treatment is imperative for compliance of the program.  Counseling on the importance of continued smoking cessation for former smokers; the importance of smoking cessation for current smokers, and information about tobacco cessation interventions have been given to patient including [place] and [employer] quit [place] programs.  Written order for lung cancer screening with LDCT has been given to the patient and any and all questions have been answered to the best of my abilities.   Yearly follow up will be coordinated by Burgess Estelle, Thoracic Navigator.

## 2016-06-14 ENCOUNTER — Telehealth: Payer: Self-pay | Admitting: *Deleted

## 2016-06-14 NOTE — Telephone Encounter (Signed)
Notified patient of LDCT lung cancer screening results with recommendation for 12 month follow up imaging. Also notified of incidental finding noted below. Patient verbalizes understanding. This note will be forwarded to PCP  IMPRESSION: 1. Lung-RADS Category 2, benign appearance or behavior. Continue annual screening with low-dose chest CT without contrast in 12 months. 2. Probable retained secretion in the right mainstem bronchus. Recommend attention on follow-up. 3.  Aortic atherosclerosis.

## 2016-07-16 ENCOUNTER — Ambulatory Visit
Admission: RE | Admit: 2016-07-16 | Discharge: 2016-07-16 | Disposition: A | Discharge status: Home / Self Care | Payer: Medicare Other | Source: Ambulatory Visit | Attending: General Surgery | Admitting: General Surgery

## 2016-07-16 DIAGNOSIS — Z1231 Encounter for screening mammogram for malignant neoplasm of breast: Secondary | ICD-10-CM | POA: Diagnosis present

## 2016-07-16 HISTORY — DX: Personal history of irradiation: Z92.3

## 2016-07-22 ENCOUNTER — Ambulatory Visit (INDEPENDENT_AMBULATORY_CARE_PROVIDER_SITE_OTHER): Payer: Medicare Other | Admitting: General Surgery

## 2016-07-22 ENCOUNTER — Telehealth: Payer: Self-pay | Admitting: General Surgery

## 2016-07-22 ENCOUNTER — Encounter: Payer: Self-pay | Admitting: General Surgery

## 2016-07-22 VITALS — BP 112/70 | HR 68 | Resp 16 | Ht 59.0 in | Wt 121.0 lb

## 2016-07-22 DIAGNOSIS — Z853 Personal history of malignant neoplasm of breast: Secondary | ICD-10-CM

## 2016-07-22 NOTE — Patient Instructions (Signed)
Patient will be asked to return to the office in one year with a left breast  screening mammogram.

## 2016-07-22 NOTE — Progress Notes (Signed)
Patient ID: Andrea [name], female   DOB: 1951-03-31, 66 y.o.   MRN: [id number]  Chief Complaint  Patient presents with  . Follow-up    HPI Andrea [name] is a 66 y.o. female who presents for a breast evaluation. The most recent left breast  mammogram was done on  07/16/2016.  Patient does not perform regular self breast checks and gets regular mammograms done.    HPI  Past Medical History:  Diagnosis Date  . Breast cancer ([place]) 2008   RT LUMPECTOMY  . Breast cancer ([place]) 2011   RT MASTECTOMY  . 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 Cervical post-laminectomy syndrome   . Complication of anesthesia    itching real bad or msucles spasms  . Cubital tunnel syndrome   . Disorders of sacrum   . Disturbance of skin sensation   . Hypertension   . Late effects of acute poliomyelitis   . Lateral epicondylitis  of elbow   . Parkinson's disease ([place]) 2011  . Peripheral vascular disease ([place]) 2011   venous stasis   . Personal history of radiation therapy   . Radiation 2008   BREAST CA  . Unspecified musculoskeletal disorders and symptoms referable to neck    cervical/trapezius    Past Surgical History:  Procedure Laterality Date  . ABDOMINAL HYSTERECTOMY  1997  . BREAST LUMPECTOMY Right 2008   wide excision, whole breast radiation  . BREAST SURGERY Right December 04, 2009   Right simple mastectomy, sentinel node biopsy.  Marland Kitchen CARPAL TUNNEL RELEASE    . COLONOSCOPY  [id number]   Dr. Allen Norris, Dr. Vira Agar  . ELBOW SURGERY Left 2011  . ELBOW SURGERY Left 2015  . HIP SURGERY Right 06/19/12  . LEG SURGERY Right 1958   Corrcetive surgery for polio  . LEG SURGERY Left 1958   corrective surgery for polio  . MASTECTOMY Right 2011   BREAST CA  . SHOULDER ARTHROSCOPY W/ ROTATOR CUFF REPAIR Bilateral [id number]  . SHOULDER ARTHROSCOPY WITH OPEN ROTATOR CUFF REPAIR Left 08/19/2015   Procedure: SHOULDER ARTHROSCOPY WITH  MINI OPEN ROTATOR CUFF  REPAIR,SUBACROMIAL DECOMPRESSION, ARTHROSCOPIC BICEPS TENODESIS;  Surgeon: Thornton Park, MD;  Location: [facility] ORS;  Service: Orthopedics;  Laterality: Left;  . SHOULDER SURGERY Left 2014  . SPINE SURGERY    . TOE FUSION Left 1970   little toe fusion    Family History  Problem Relation Age of Onset  . Cancer Mother   . Cancer Father   . Breast cancer Neg Hx     Social History Social History  Substance Use Topics  . Smoking status: Current Every Day Smoker    Packs/day: 1.25    Years: 45.00    Types: Cigarettes  . Smokeless tobacco: Never Used  . Alcohol use No     Comment: No alcohol in the last 1.5 years    Allergies  Allergen Reactions  . Lisinopril     Raises BP    Current Outpatient Prescriptions  Medication Sig Dispense Refill  . amLODipine (NORVASC) 10 MG tablet Take 5 mg by mouth every morning.     . cholecalciferol (VITAMIN D) 1000 UNITS tablet Take 2,000 Units by mouth every morning.     . diphenhydrAMINE (BENADRYL) 50 MG tablet Take 50 mg by mouth at bedtime as needed for sleep.    Marland Kitchen EST ESTROGENS-METHYLTEST DS 1.25-2.5 MG TABS Take 1.25-2.5 mg by mouth daily. (Patient taking differently: Take  1.25-2.5 mg by mouth every 3 (three) days. 1 tablet every 3rd day.) 30 each 6  . HYDROcodone-acetaminophen (NORCO) 10-325 MG tablet Take 1 tablet by mouth every 6 (six) hours as needed.    . lidocaine (LIDODERM) 5 % Place 1 patch onto the skin daily as needed. Remove & Discard patch within 12 hours or as directed by MD    . losartan (COZAAR) 50 MG tablet Take 50 mg by mouth every morning.     . meloxicam (MOBIC) 7.5 MG tablet Take 7.5 mg by mouth daily.    Marland Kitchen omeprazole (PRILOSEC) 40 MG capsule Take 1 capsule by mouth every morning.     . polyethylene glycol (MIRALAX / GLYCOLAX) packet Take 17 g by mouth as needed.    . pregabalin (LYRICA) 50 MG capsule Take 50 mg by mouth 3 (three) times daily.    Marland Kitchen tiZANidine (ZANAFLEX) 4 MG tablet 3 (three) times daily.     . traZODone  (DESYREL) 50 MG tablet Take 50 mg by mouth at bedtime. 1-2 tablets as needed for sleep.     No current facility-administered medications for this visit.     Review of Systems Review of Systems  Constitutional: Negative.   Respiratory: Negative.   Cardiovascular: Negative.     Blood pressure 112/70, pulse 68, resp. rate 16, height 4\' 11"  (1.499 m), weight 121 lb (54.9 kg).  Physical Exam Physical Exam  Constitutional: She is oriented to person, place, and time. She appears well-developed and well-nourished.  Eyes: Conjunctivae are normal. No scleral icterus.  Neck: Neck supple.  Cardiovascular: Normal rate, regular rhythm and normal heart sounds.   Pulmonary/Chest: Effort normal and breath sounds normal. Left breast exhibits no inverted nipple, no mass, no nipple discharge, no skin change and no tenderness.    Right mastectomy site is clean and well healed.   Lymphadenopathy:    She has no cervical adenopathy.       Left cervical: No superficial cervical, no deep cervical and no posterior cervical adenopathy present.  Neurological: She is alert and oriented to person, place, and time.  Skin: Skin is warm and dry.    Data Reviewed Left breast screening mammogram dated 07/16/2016 reviewed. No interval change. BI-RADS-1.  Assessment    No evidence of recurrent right breast DCIS, no contralateral cancer.  Continued smoking.      Plan       Patient will be asked to return to the office in one year with a left breast  screening mammogram.  This information has been scribed by Gaspar Cola CMA.   Robert Bellow 07/23/2016, 7:04 AM

## 2016-07-22 NOTE — Telephone Encounter (Signed)
PATIENT CAME IN AND WOULD LIKE A PRESCRIPTION FOR A BREAT  PROSTHETIC & BRA'S.PLEASE MAIL TO PATIENT AT [address] [place] [id number]

## 2016-07-23 ENCOUNTER — Encounter: Payer: Self-pay | Admitting: General Surgery

## 2016-07-26 NOTE — Telephone Encounter (Signed)
RX for prosthesis and surgical bra x 6 sent.

## 2016-08-11 DIAGNOSIS — R251 Tremor, unspecified: Secondary | ICD-10-CM | POA: Insufficient documentation

## 2016-08-22 ENCOUNTER — Other Ambulatory Visit: Payer: Self-pay | Admitting: Obstetrics and Gynecology

## 2016-12-03 ENCOUNTER — Other Ambulatory Visit: Payer: Self-pay | Admitting: Orthopedic Surgery

## 2016-12-03 DIAGNOSIS — M5416 Radiculopathy, lumbar region: Secondary | ICD-10-CM

## 2016-12-06 ENCOUNTER — Ambulatory Visit
Admission: RE | Admit: 2016-12-06 | Discharge: 2016-12-06 | Disposition: A | Discharge status: Home / Self Care | Payer: Medicare Other | Source: Ambulatory Visit | Attending: Orthopedic Surgery | Admitting: Orthopedic Surgery

## 2016-12-06 DIAGNOSIS — M5416 Radiculopathy, lumbar region: Secondary | ICD-10-CM | POA: Diagnosis present

## 2016-12-06 DIAGNOSIS — M48061 Spinal stenosis, lumbar region without neurogenic claudication: Secondary | ICD-10-CM | POA: Insufficient documentation

## 2016-12-06 DIAGNOSIS — M4316 Spondylolisthesis, lumbar region: Secondary | ICD-10-CM | POA: Insufficient documentation

## 2016-12-10 DIAGNOSIS — Z86010 Personal history of colonic polyps: Secondary | ICD-10-CM | POA: Insufficient documentation

## 2016-12-13 ENCOUNTER — Other Ambulatory Visit: Payer: Self-pay | Admitting: Orthopedic Surgery

## 2016-12-13 DIAGNOSIS — M5416 Radiculopathy, lumbar region: Secondary | ICD-10-CM

## 2016-12-13 IMAGING — MR MR SHOULDER*L* W/O CM
5 series · 40 of 40 positions shown · non-contrast
Comparison: MRI 07/25/2012.

CLINICAL DATA: Left shoulder pain with painful range of motion for
6 months. No known injury. History of shoulder surgery in [DATE]
and 9310.

EXAM:
MRI OF THE LEFT SHOULDER WITHOUT CONTRAST
TECHNIQUE: Multiplanar, multisequence MR imaging of the shoulder was performed.
No intravenous contrast was administered.

[Series 3: T2 fat-sat · axial · 4.0mm · 0.47mm/px · z∈[-28,+60]mm · 8 of 21 slices shown (1 of 3)]
[im 1/21]
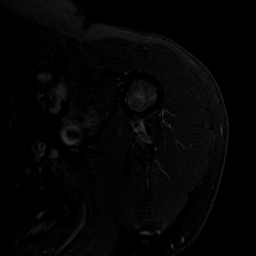
[im 3/21]
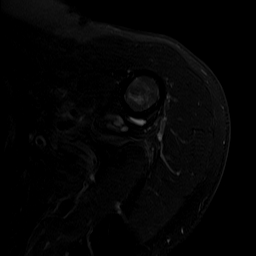
[im 6/21]
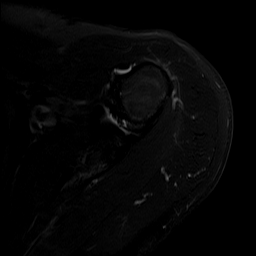
[im 9/21]
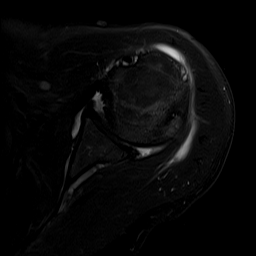
[im 12/21]
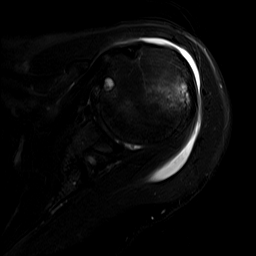
[im 15/21]
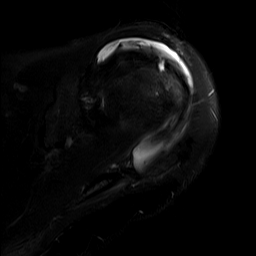
[im 18/21]
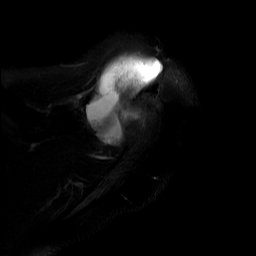
[im 21/21]
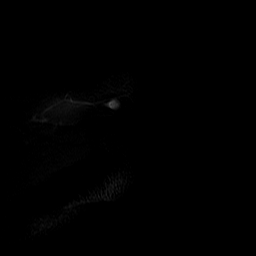

[Series 4: T2 fat-sat · oblique · 4.0mm · 0.62mm/px · 8 of 21 slices shown (2 of 3)]
[im 1/21]
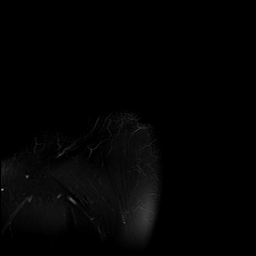
[im 3/21]
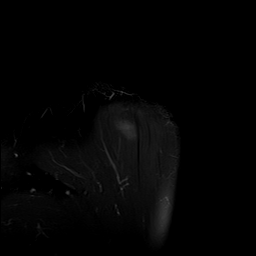
[im 6/21]
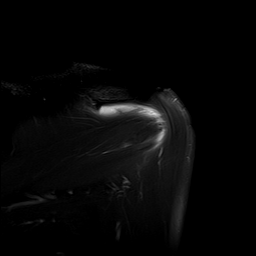
[im 9/21]
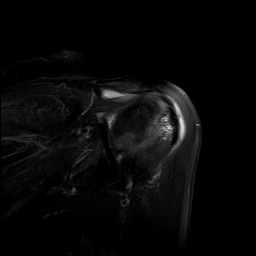
[im 12/21]
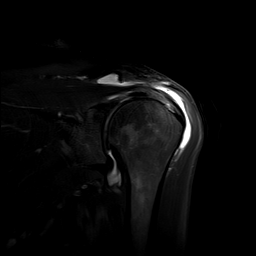
[im 15/21]
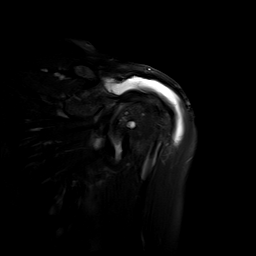
[im 18/21]
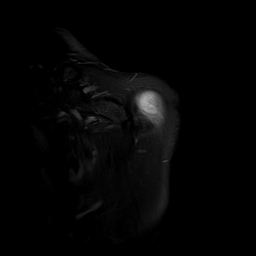
[im 21/21]
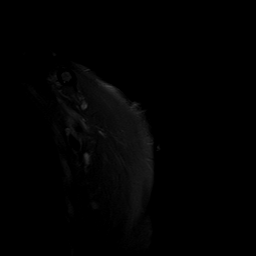

[Series 5: PD · oblique · 4.0mm · 0.62mm/px · 8 of 19 slices shown]
[im 1/19]
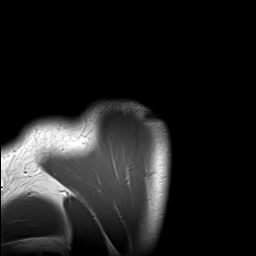
[im 3/19]
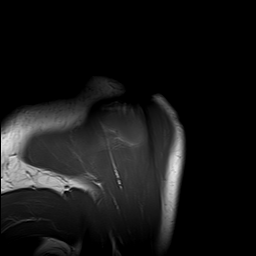
[im 6/19]
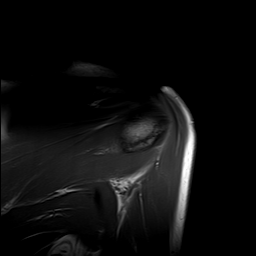
[im 8/19]
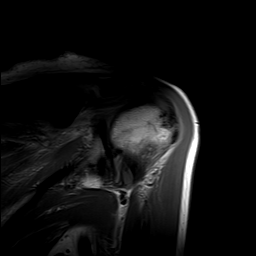
[im 11/19]
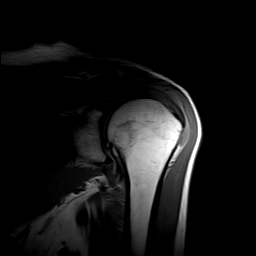
[im 13/19]
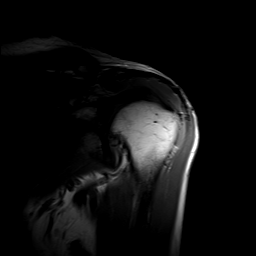
[im 16/19]
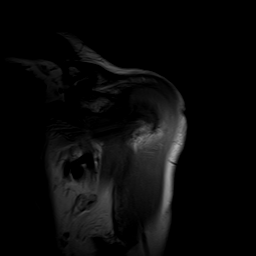
[im 19/19]
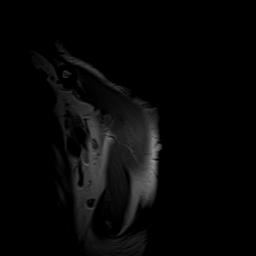

[Series 6: T1 · oblique · 4.0mm · 0.62mm/px · 8 of 19 slices shown]
[im 1/19]
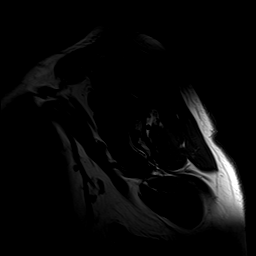
[im 3/19]
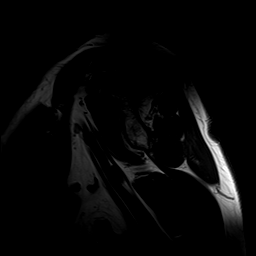
[im 6/19]
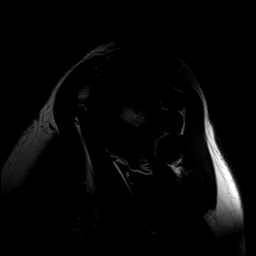
[im 8/19]
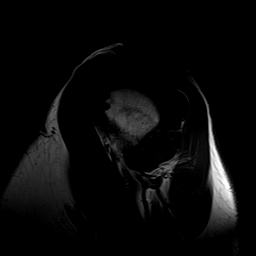
[im 11/19]
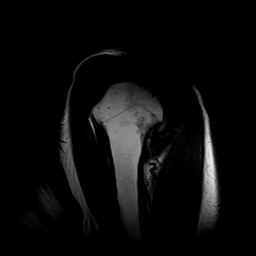
[im 13/19]
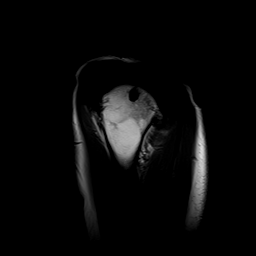
[im 16/19]
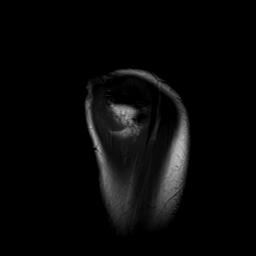
[im 19/19]
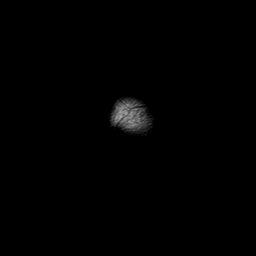

[Series 7: T2 fat-sat · oblique · 4.0mm · 0.62mm/px · 8 of 19 slices shown (3 of 3)]
[im 1/19]
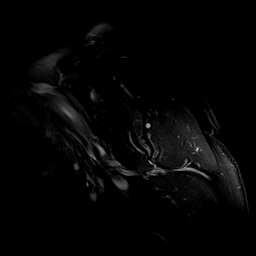
[im 3/19]
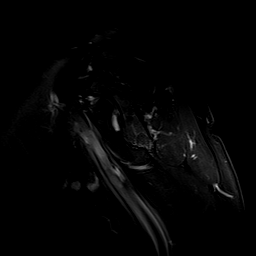
[im 6/19]
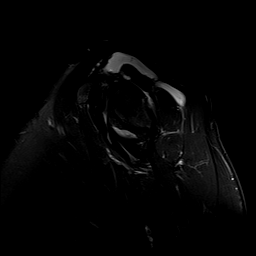
[im 8/19]
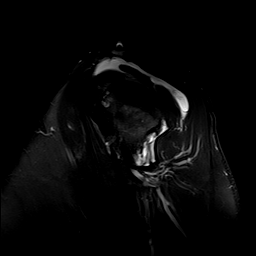
[im 11/19]
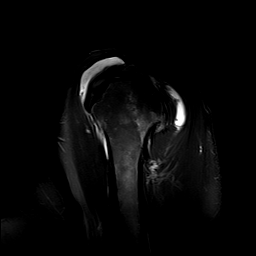
[im 13/19]
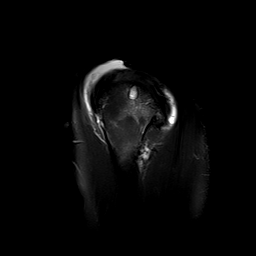
[im 16/19]
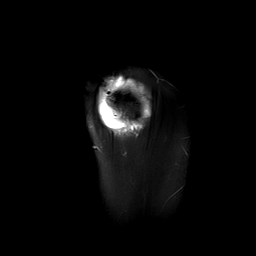
[im 19/19]
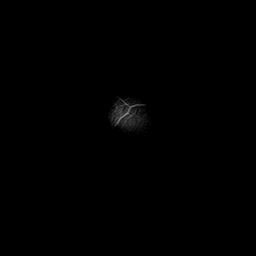

[40 of 40 positions shown; findings below may reference images not displayed]

FINDINGS: Rotator cuff: There are interval postsurgical changes suggesting
distal clavicle resection, acromioplasty and possible rotator cuff
debridement. No definite surgical changes in the humeral head. There
is diffuse supraspinatus and infraspinatus tendinosis with
high-grade partial articular surface insertional tearing of the
supraspinatus tendon. The bursal surface fibers appear intact, and
there is no evidence of full-thickness rotator cuff tear. The
subscapularis and teres minor tendons appear normal.

Muscles:  No focal muscular atrophy or edema.

Biceps long head:  Intact with probable mild bicipital tendinosis.

Acromioclavicular Joint: As above, probable interval acromioplasty
and distal clavicle resection. There is a large amount of fluid in
the subacromial-subdeltoid bursa. No osseous encroachment on the
rotator cuff passageway seen.

Glenohumeral Joint: Small shoulder joint effusion and mild
glenohumeral degenerative changes.

Labrum:  Labral degeneration without evidence of tear.

Bones: No acute or significant extra-articular osseous findings.
Chronic cystic changes in the humeral head medial to the lesser
tuberosity and near the infraspinatus insertion.
IMPRESSION: 1. Interval postsurgical changes consistent with acromioplasty and
distal clavicle resection with possible rotator cuff debridement.
2. Progressive distal rotator cuff tendinosis with high-grade
partial articular surface insertional tear of the supraspinatus
tendon. No full-thickness tendon tear identified.
3. Large amount of fluid in the subacromial -subdeltoid bursa.
4. The labrum and biceps tendon appear intact.

## 2016-12-27 ENCOUNTER — Encounter: Payer: Self-pay | Admitting: Physical Therapy

## 2016-12-27 ENCOUNTER — Ambulatory Visit: Payer: Medicare Other | Attending: Orthopedic Surgery | Admitting: Physical Therapy

## 2016-12-27 DIAGNOSIS — M6281 Muscle weakness (generalized): Secondary | ICD-10-CM | POA: Insufficient documentation

## 2016-12-27 DIAGNOSIS — G8929 Other chronic pain: Secondary | ICD-10-CM | POA: Diagnosis present

## 2016-12-27 DIAGNOSIS — M5441 Lumbago with sciatica, right side: Secondary | ICD-10-CM | POA: Diagnosis not present

## 2016-12-27 DIAGNOSIS — R269 Unspecified abnormalities of gait and mobility: Secondary | ICD-10-CM | POA: Diagnosis present

## 2016-12-28 NOTE — Therapy (Signed)
[place] [facility] [address] [place], [place], [address] Phone: [phone]   Fax:  [phone]  Physical Therapy Evaluation  Patient Details  Name: Andrea [name] MRN: [id number] Date of Birth: 1950-06-03 Referring Provider: Dr. Kurtis Bushman  Encounter Date: 12/27/2016    1 out of 8.  End of cert: 7/82/42    Past Medical History:  Diagnosis Date  . Breast cancer ([place]) 2008   RT LUMPECTOMY  . Breast cancer ([place]) 2011   RT MASTECTOMY  . 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 Cervical post-laminectomy syndrome   . Complication of anesthesia    itching real bad or msucles spasms  . Cubital tunnel syndrome   . Disorders of sacrum   . Disturbance of skin sensation   . Hypertension   . Late effects of acute poliomyelitis   . Lateral epicondylitis  of elbow   . Parkinson's disease ([place]) 2011  . Peripheral vascular disease ([place]) 2011   venous stasis   . Personal history of radiation therapy   . Radiation 2008   BREAST CA  . Unspecified musculoskeletal disorders and symptoms referable to neck    cervical/trapezius    Past Surgical History:  Procedure Laterality Date  . ABDOMINAL HYSTERECTOMY  1997  . BREAST LUMPECTOMY Right 2008   4 cm area of DCIS, margins less than 1 mm. Treated with wide excision, whole breast radiation  . BREAST SURGERY Right December 04, 2009   Right simple mastectomy, sentinel node biopsy.  Marland Kitchen CARPAL TUNNEL RELEASE    . COLONOSCOPY  [id number]   Dr. Allen Norris, Dr. Vira Agar  . ELBOW SURGERY Left 2011  . ELBOW SURGERY Left 2015  . HIP SURGERY Right 06/19/12  . LEG SURGERY Right 1958   Corrcetive surgery for polio  . LEG SURGERY Left 1958   corrective surgery for polio  . MASTECTOMY Right 2011   BREAST CA  . SHOULDER ARTHROSCOPY W/ ROTATOR CUFF REPAIR Bilateral [id number]  . SHOULDER ARTHROSCOPY WITH OPEN ROTATOR CUFF REPAIR Left 08/19/2015   Procedure: SHOULDER  ARTHROSCOPY WITH  MINI OPEN ROTATOR CUFF REPAIR,SUBACROMIAL DECOMPRESSION, ARTHROSCOPIC BICEPS TENODESIS;  Surgeon: Thornton Park, MD;  Location: [facility] ORS;  Service: Orthopedics;  Laterality: Left;  . SHOULDER SURGERY Left 2014  . SPINE SURGERY    . TOE FUSION Left 1970   little toe fusion    There were no vitals filed for this visit.           [facility] PT Assessment - 12/28/16 0001      Assessment   Medical Diagnosis Lumbar radiculopathy/ Low back pain   Referring Provider Dr. Kurtis Bushman   Onset Date/Surgical Date 10/31/16   Prior Therapy yes       Pt. reports radicular pain into R LE about 2.5 months ago.  Pt. had an injection last week with benefit.  Decrease R groin pain since injection but feels it is coming back.     See HEP.    Pt. is a 66 y/o female with chronic h/o R low back/ hip/ groin pain.  Pt. is known to PT clinic secondary to chronic h/o gait difficulty/ balance issues.  Lumbar AROM WNL.  R LE muscle strength grossly 4+/5 MMT except hip flexion/ adduction 4/5 MMT.  L LE muscle weakness (post-Polio syndrome)- wearing knee extension locking brace.  No pain with prone press-ups/ knee flexion.  LLD noted and pt. has a external  lift to offset discrepancy with standing/walking tasks.  Increase R groin pain with hip abd./ adduction/ piriformis stretch.  Significant L antalgic gait pattern with and without use of SPC.  Pt. entered PT with no assistive device but PT recommends use of SPC with all aspects of walking/functional mobility to decrease lateral sway.  MODI: 38%.  Pt. will benefit from skilled PT serivces to increase core strengthening/ R hip mobility to improve low back/groin pain with daily tasks.          PT Long Term Goals - 12/28/16 2009      PT LONG TERM GOAL #1   Title Pt. independent with HEP to increase R LE/core strength to WNL to improve pain-free mobility.     Baseline R LE muscle strength grossly 4+/5 MMT except hip flexion/ adduction 4/5 MMT.   L LE muscle weakness (post-Polio syndrome   Time 4   Period Weeks   Status New   Target Date 02/21/17     PT LONG TERM GOAL #2   Title Pt. will decrease MODI to <30% to improve pain-free functional mobility.    Baseline MODI: 38% on 8/27   Time 4   Period Weeks   Status New   Target Date 02/21/17     PT LONG TERM GOAL #3   Title Pt. will report <2/10 R low back/groin pain with household ambulation to improve pain-free mobility.    Baseline >3/10 R hip/groin/low back pain at rest and >5/10 pain at worst.    Time 4   Period Weeks   Status New   Target Date 02/21/17     PT LONG TERM GOAL #4   Title Pt. will increase LEFS to >45 out of 80 to improve functional mobility.    Baseline LEFS: 33 out of 80.     Time 4   Period Weeks   Status New   Target Date 02/21/17              Patient will benefit from skilled therapeutic intervention in order to improve the following deficits and impairments:  Hypomobility, Decreased activity tolerance, Decreased strength, Impaired UE functional use, Pain, Decreased mobility, Decreased range of motion, Improper body mechanics, Postural dysfunction, Impaired flexibility, Difficulty walking  Visit Diagnosis: Chronic right-sided low back pain with right-sided sciatica  Muscle weakness (generalized)  Gait difficulty     Problem List Patient Active Problem List   Diagnosis Date Noted  . Personal history of tobacco use, presenting hazards to health 06/13/2016  . Personal history of malignant neoplasm of breast 07/26/2014  . 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  . Lumbosacral spondylosis without myelopathy 04/30/2013  . Subacute lumbar radiculopathy 04/30/2013  . Chronic low back pain 02/19/2013  . 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 Trochanteric bursitis of right hip 04/11/2012  . Sacroiliac joint dysfunction of both sides 07/12/2011   Pura Spice, PT, DPT # [phone] 12/28/2016, 8:17 PM  [place] [facility] [address] [place], [place], [address] Phone: [phone]   Fax:  [phone]  Name: Andrea [name] MRN: [id number] Date of Birth: 1950-05-24

## 2017-01-05 ENCOUNTER — Ambulatory Visit: Payer: Medicare Other | Attending: Orthopedic Surgery | Admitting: Physical Therapy

## 2017-01-05 DIAGNOSIS — M5441 Lumbago with sciatica, right side: Secondary | ICD-10-CM | POA: Diagnosis present

## 2017-01-05 DIAGNOSIS — R269 Unspecified abnormalities of gait and mobility: Secondary | ICD-10-CM | POA: Insufficient documentation

## 2017-01-05 DIAGNOSIS — M6281 Muscle weakness (generalized): Secondary | ICD-10-CM | POA: Insufficient documentation

## 2017-01-05 DIAGNOSIS — G8929 Other chronic pain: Secondary | ICD-10-CM

## 2017-01-06 NOTE — Therapy (Signed)
[facility] [address] [place], [place], [address] Phone: [phone]   Fax:  [phone]  Physical Therapy Treatment  Patient Details  Name: Andrea [name] MRN: [id number] Date of Birth: 01/28/1951 Referring Provider: Dr. Kurtis Bushman  Encounter Date: 01/05/2017      PT End of Session - 01/06/17 1800    Visit Number 2   Number of Visits 8   Date for PT Re-Evaluation 01/24/17   Authorization - Visit Number 2   Authorization - Number of Visits 10   PT Start Time 1024   PT Stop Time 1120   PT Time Calculation (min) 56 min   Activity Tolerance Patient tolerated treatment well   Behavior During Therapy [facility] for tasks assessed/performed      Past Medical History:  Diagnosis Date  . Breast cancer ([place]) 2008   RT LUMPECTOMY  . Breast cancer ([place]) 2011   RT MASTECTOMY  . 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 Cervical post-laminectomy syndrome   . Complication of anesthesia    itching real bad or msucles spasms  . Cubital tunnel syndrome   . Disorders of sacrum   . Disturbance of skin sensation   . Hypertension   . Late effects of acute poliomyelitis   . Lateral epicondylitis  of elbow   . Parkinson's disease ([place]) 2011  . Peripheral vascular disease ([place]) 2011   venous stasis   . Personal history of radiation therapy   . Radiation 2008   BREAST CA  . Unspecified musculoskeletal disorders and symptoms referable to neck    cervical/trapezius    Past Surgical History:  Procedure Laterality Date  . ABDOMINAL HYSTERECTOMY  1997  . BREAST LUMPECTOMY Right 2008   4 cm area of DCIS, margins less than 1 mm. Treated with wide excision, whole breast radiation  . BREAST SURGERY Right December 04, 2009   Right simple mastectomy, sentinel node biopsy.  Marland Kitchen CARPAL TUNNEL RELEASE    . COLONOSCOPY  [id number]   Dr. Allen Norris, Dr. Vira Agar  . ELBOW SURGERY Left 2011  . ELBOW SURGERY Left 2015   . HIP SURGERY Right 06/19/12  . LEG SURGERY Right 1958   Corrcetive surgery for polio  . LEG SURGERY Left 1958   corrective surgery for polio  . MASTECTOMY Right 2011   BREAST CA  . SHOULDER ARTHROSCOPY W/ ROTATOR CUFF REPAIR Bilateral [id number]  . SHOULDER ARTHROSCOPY WITH OPEN ROTATOR CUFF REPAIR Left 08/19/2015   Procedure: SHOULDER ARTHROSCOPY WITH  MINI OPEN ROTATOR CUFF REPAIR,SUBACROMIAL DECOMPRESSION, ARTHROSCOPIC BICEPS TENODESIS;  Surgeon: Thornton Park, MD;  Location: [facility] ORS;  Service: Orthopedics;  Laterality: Left;  . SHOULDER SURGERY Left 2014  . SPINE SURGERY    . TOE FUSION Left 1970   little toe fusion    There were no vitals filed for this visit.      Subjective Assessment - 01/06/17 1759    Subjective Pt. reports 2/10 R SI/low back/groin pain currently.  Pt. has difficulty with a few of the exercises (PT reviewed/modified).     Limitations Standing;Lifting;Walking;House hold activities   Diagnostic tests see MRI   Patient Stated Goals Increase core strength/ pain management.     Currently in Pain? Yes   Pain Score 2    Pain Location Back   Pain Orientation Right   Pain Descriptors / Indicators Aching        Manual tx.:  There.ex.:      Discussed benefits of ice vs. Heat.          PT Long Term Goals - 12/28/16 2009      PT LONG TERM GOAL #1   Title Pt. independent with HEP to increase R LE/core strength to WNL to improve pain-free mobility.     Baseline R LE muscle strength grossly 4+/5 MMT except hip flexion/ adduction 4/5 MMT.  L LE muscle weakness (post-Polio syndrome   Time 4   Period Weeks   Status New   Target Date 02/21/17     PT LONG TERM GOAL #2   Title Pt. will decrease MODI to <30% to improve pain-free functional mobility.    Baseline MODI: 38% on 8/27   Time 4   Period Weeks   Status New   Target Date 02/21/17     PT LONG TERM GOAL #3   Title Pt. will report <2/10 R low back/groin pain with household ambulation to  improve pain-free mobility.    Baseline >3/10 R hip/groin/low back pain at rest and >5/10 pain at worst.    Time 4   Period Weeks   Status New   Target Date 02/21/17     PT LONG TERM GOAL #4   Title Pt. will increase LEFS to >45 out of 80 to improve functional mobility.    Baseline LEFS: 33 out of 80.     Time 4   Period Weeks   Status New   Target Date 02/21/17               Plan - 01/06/17 1800    Clinical Presentation Stable   Clinical Decision Making Moderate   Rehab Potential Good   PT Frequency 1x / week   PT Duration 4 weeks   PT Treatment/Interventions ADLs/Self Care Home Management;Aquatic Therapy;Therapeutic exercise;Therapeutic activities;Functional mobility training;Patient/family education;Manual techniques;Energy conservation;Passive range of motion;Scar mobilization;Gait training;Stair training;Balance training;Cryotherapy   PT Next Visit Plan Progress HEP   PT Home Exercise Plan TrA ex. program/ stretches (see handouts).       Patient will benefit from skilled therapeutic intervention in order to improve the following deficits and impairments:  Hypomobility, Decreased activity tolerance, Decreased strength, Impaired UE functional use, Pain, Decreased mobility, Decreased range of motion, Improper body mechanics, Postural dysfunction, Impaired flexibility, Difficulty walking  Visit Diagnosis: Chronic right-sided low back pain with right-sided sciatica  Muscle weakness (generalized)  Gait difficulty     Problem List Patient Active Problem List   Diagnosis Date Noted  . Personal history of tobacco use, presenting hazards to health 06/13/2016  . Personal history of malignant neoplasm of breast 07/26/2014  . 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  . Lumbosacral spondylosis without myelopathy 04/30/2013  . Subacute lumbar radiculopathy 04/30/2013  . Chronic low back pain 02/19/2013  . 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 Trochanteric bursitis of  right hip 04/11/2012  . Sacroiliac joint dysfunction of both sides 07/12/2011    Pura Spice 01/06/2017, 6:01 PM  [place] [facility] [address] [place], [place], [address] Phone: [phone]   Fax:  [phone]  Name: CARYL FATE MRN: [id number] Date of Birth: Apr 29, 1951

## 2017-01-11 ENCOUNTER — Ambulatory Visit: Payer: Medicare Other | Admitting: Physical Therapy

## 2017-01-11 ENCOUNTER — Encounter: Payer: Self-pay | Admitting: Physical Therapy

## 2017-01-11 DIAGNOSIS — G8929 Other chronic pain: Secondary | ICD-10-CM

## 2017-01-11 DIAGNOSIS — R269 Unspecified abnormalities of gait and mobility: Secondary | ICD-10-CM

## 2017-01-11 DIAGNOSIS — M5441 Lumbago with sciatica, right side: Principal | ICD-10-CM

## 2017-01-11 DIAGNOSIS — M6281 Muscle weakness (generalized): Secondary | ICD-10-CM

## 2017-01-11 NOTE — Therapy (Signed)
[facility] [address] [place], [place], [address] Phone: [phone]   Fax:  [phone]  Physical Therapy Treatment  Patient Details  Name: Andrea [name] MRN: [id number] Date of Birth: [birth year] Referring Provider: Dr. Kurtis Bushman  Encounter Date: 01/11/2017      PT End of Session - 01/11/17 1139    Visit Number 3   Number of Visits 8   Date for PT Re-Evaluation 01/24/17   Authorization - Visit Number 3   Authorization - Number of Visits 10   PT Start Time 2993   PT Stop Time 1121   PT Time Calculation (min) 50 min   Activity Tolerance Patient tolerated treatment well   Behavior During Therapy [facility] for tasks assessed/performed      Past Medical History:  Diagnosis Date  . Breast cancer ([place]) 2008   RT LUMPECTOMY  . Breast cancer ([place]) 2011   RT MASTECTOMY  . 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 Cervical post-laminectomy syndrome   . Complication of anesthesia    itching real bad or msucles spasms  . Cubital tunnel syndrome   . Disorders of sacrum   . Disturbance of skin sensation   . Hypertension   . Late effects of acute poliomyelitis   . Lateral epicondylitis  of elbow   . Parkinson's disease ([place]) 2011  . Peripheral vascular disease ([place]) 2011   venous stasis   . Personal history of radiation therapy   . Radiation 2008   BREAST CA  . Unspecified musculoskeletal disorders and symptoms referable to neck    cervical/trapezius    Past Surgical History:  Procedure Laterality Date  . ABDOMINAL HYSTERECTOMY  1997  . BREAST LUMPECTOMY Right 2008   4 cm area of DCIS, margins less than 1 mm. Treated with wide excision, whole breast radiation  . BREAST SURGERY Right December 04, 2009   Right simple mastectomy, sentinel node biopsy.  Marland Kitchen CARPAL TUNNEL RELEASE    . COLONOSCOPY  [id number]   Dr. Allen Norris, Dr. Vira Agar  . ELBOW SURGERY Left 2011  . ELBOW SURGERY Left  2015  . HIP SURGERY Right 06/19/12  . LEG SURGERY Right 1958   Corrcetive surgery for polio  . LEG SURGERY Left 1958   corrective surgery for polio  . MASTECTOMY Right 2011   BREAST CA  . SHOULDER ARTHROSCOPY W/ ROTATOR CUFF REPAIR Bilateral [id number]  . SHOULDER ARTHROSCOPY WITH OPEN ROTATOR CUFF REPAIR Left 08/19/2015   Procedure: SHOULDER ARTHROSCOPY WITH  MINI OPEN ROTATOR CUFF REPAIR,SUBACROMIAL DECOMPRESSION, ARTHROSCOPIC BICEPS TENODESIS;  Surgeon: Thornton Park, MD;  Location: [facility] ORS;  Service: Orthopedics;  Laterality: Left;  . SHOULDER SURGERY Left 2014  . SPINE SURGERY    . TOE FUSION Left 1970   little toe fusion    There were no vitals filed for this visit.      Subjective Assessment - 01/11/17 1042    Subjective Pt. states her back pain was a 4/10 last night due to trying to sleep in prone.  Pt. normally sleeps on R side.  2/10 LBP currently at rest.      Limitations Standing;Lifting;Walking;House hold activities   Diagnostic tests see MRI   Patient Stated Goals Increase core strength/ pain management.     Currently in Pain? Yes   Pain Score 2    Pain Location Back   Pain Orientation Right   Pain Descriptors / Indicators Aching  There.ex.:  Reviewed HEP.  L sidelying R hip clamshells 20x (added light manual resistance to last 10 reps.).    Manual tx.: Supine R LE/ lumbar stretches (11 min.).  R hip flexor stretch in supine with static holds over edge of mat table 4x with knee flexion/ quad stretch.  B hip adductor stretches with static holds as tolerated.  STM to R hip /lumbar region in L sidelying/ prone posture.  R LAD in supine position 3x.  MH to lumbar region in prone after tx. Session.           PT Long Term Goals - 12/28/16 2009      PT LONG TERM GOAL #1   Title Pt. independent with HEP to increase R LE/core strength to WNL to improve pain-free mobility.     Baseline R LE muscle strength grossly 4+/5 MMT except hip flexion/ adduction 4/5  MMT.  L LE muscle weakness (post-Polio syndrome   Time 4   Period Weeks   Status New   Target Date 02/21/17     PT LONG TERM GOAL #2   Title Pt. will decrease MODI to <30% to improve pain-free functional mobility.    Baseline MODI: 38% on 8/27   Time 4   Period Weeks   Status New   Target Date 02/21/17     PT LONG TERM GOAL #3   Title Pt. will report <2/10 R low back/groin pain with household ambulation to improve pain-free mobility.    Baseline >3/10 R hip/groin/low back pain at rest and >5/10 pain at worst.    Time 4   Period Weeks   Status New   Target Date 02/21/17     PT LONG TERM GOAL #4   Title Pt. will increase LEFS to >45 out of 80 to improve functional mobility.    Baseline LEFS: 33 out of 80.     Time 4   Period Weeks   Status New   Target Date 02/21/17            Plan - 01/11/17 1140    Clinical Impression Statement R groin discomfort with piriformis stretches/ knee to chest (pinching pain).  Good technique with supine/sidelying clamshells.  Pain limited with resisted hip adduction.  No increase c/o back pain with primary discomfort in R hip/groin.     Clinical Presentation Stable   Clinical Decision Making Moderate   Rehab Potential Good   PT Frequency 1x / week   PT Duration 4 weeks   PT Treatment/Interventions ADLs/Self Care Home Management;Aquatic Therapy;Therapeutic exercise;Therapeutic activities;Functional mobility training;Patient/family education;Manual techniques;Energy conservation;Passive range of motion;Scar mobilization;Gait training;Stair training;Balance training;Cryotherapy   PT Next Visit Plan Progress HEP   PT Home Exercise Plan TrA ex. program/ stretches (see handouts).       Patient will benefit from skilled therapeutic intervention in order to improve the following deficits and impairments:  Hypomobility, Decreased activity tolerance, Decreased strength, Impaired UE functional use, Pain, Decreased mobility, Decreased range of motion,  Improper body mechanics, Postural dysfunction, Impaired flexibility, Difficulty walking  Visit Diagnosis: Chronic right-sided low back pain with right-sided sciatica  Muscle weakness (generalized)  Gait difficulty     Problem List Patient Active Problem List   Diagnosis Date Noted  . Personal history of tobacco use, presenting hazards to health 06/13/2016  . Personal history of malignant neoplasm of breast 07/26/2014  . 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  . Lumbosacral spondylosis without myelopathy 04/30/2013  . Subacute lumbar radiculopathy 04/30/2013  .  Chronic low back pain 02/19/2013  . 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 Trochanteric bursitis of right hip 04/11/2012  . Sacroiliac joint dysfunction of both sides 07/12/2011   Pura Spice, PT, DPT # [phone] 01/11/2017, 12:57 PM  [place] [facility] [address] [place], [place], [address] Phone: [phone]   Fax:  [phone]  Name: Andrea [name] MRN: [id number] Date of Birth: 1950/05/10

## 2017-01-18 ENCOUNTER — Encounter: Payer: Self-pay | Admitting: Physical Therapy

## 2017-01-18 ENCOUNTER — Ambulatory Visit: Payer: Medicare Other | Admitting: Physical Therapy

## 2017-01-18 DIAGNOSIS — M6281 Muscle weakness (generalized): Secondary | ICD-10-CM

## 2017-01-18 DIAGNOSIS — G8929 Other chronic pain: Secondary | ICD-10-CM

## 2017-01-18 DIAGNOSIS — M5441 Lumbago with sciatica, right side: Principal | ICD-10-CM

## 2017-01-18 DIAGNOSIS — R269 Unspecified abnormalities of gait and mobility: Secondary | ICD-10-CM

## 2017-01-18 NOTE — Therapy (Signed)
[place] [facility] [address] [place], [place], [address] Phone: [phone]   Fax:  [phone]  Physical Therapy Treatment  Patient Details  Name: Andrea [name] MRN: [id number] Date of Birth: 05/18/1950 Referring Provider: Dr. Kurtis Bushman  Encounter Date: 01/18/2017      PT End of Session - 01/18/17 1019    Visit Number 4   Number of Visits 8   Date for PT Re-Evaluation 01/24/17   Authorization - Visit Number 4   Authorization - Number of Visits 10   PT Start Time 1019   PT Stop Time 1109   PT Time Calculation (min) 50 min   Activity Tolerance Patient tolerated treatment well   Behavior During Therapy [facility] for tasks assessed/performed      Past Medical History:  Diagnosis Date  . Breast cancer ([place]) 2008   RT LUMPECTOMY  . Breast cancer ([place]) 2011   RT MASTECTOMY  . 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 Cervical post-laminectomy syndrome   . Complication of anesthesia    itching real bad or msucles spasms  . Cubital tunnel syndrome   . Disorders of sacrum   . Disturbance of skin sensation   . Hypertension   . Late effects of acute poliomyelitis   . Lateral epicondylitis  of elbow   . Parkinson's disease ([place]) 2011  . Peripheral vascular disease ([place]) 2011   venous stasis   . Personal history of radiation therapy   . Radiation 2008   BREAST CA  . Unspecified musculoskeletal disorders and symptoms referable to neck    cervical/trapezius    Past Surgical History:  Procedure Laterality Date  . ABDOMINAL HYSTERECTOMY  1997  . BREAST LUMPECTOMY Right 2008   4 cm area of DCIS, margins less than 1 mm. Treated with wide excision, whole breast radiation  . BREAST SURGERY Right December 04, 2009   Right simple mastectomy, sentinel node biopsy.  Marland Kitchen CARPAL TUNNEL RELEASE    . COLONOSCOPY  [id number]   Dr. Allen Norris, Dr. Vira Agar  . ELBOW SURGERY Left 2011  . ELBOW SURGERY Left  2015  . HIP SURGERY Right 06/19/12  . LEG SURGERY Right 1958   Corrcetive surgery for polio  . LEG SURGERY Left 1958   corrective surgery for polio  . MASTECTOMY Right 2011   BREAST CA  . SHOULDER ARTHROSCOPY W/ ROTATOR CUFF REPAIR Bilateral [id number]  . SHOULDER ARTHROSCOPY WITH OPEN ROTATOR CUFF REPAIR Left 08/19/2015   Procedure: SHOULDER ARTHROSCOPY WITH  MINI OPEN ROTATOR CUFF REPAIR,SUBACROMIAL DECOMPRESSION, ARTHROSCOPIC BICEPS TENODESIS;  Surgeon: Thornton Park, MD;  Location: [facility] ORS;  Service: Orthopedics;  Laterality: Left;  . SHOULDER SURGERY Left 2014  . SPINE SURGERY    . TOE FUSION Left 1970   little toe fusion    There were no vitals filed for this visit.      Subjective Assessment - 01/18/17 1019    Subjective Pt. states R groin pain does not want to go away.  Pt. reports 3/10 R groin pain currently with some back discomfort.     Limitations Standing;Lifting;Walking;House hold activities   Diagnostic tests see MRI   Patient Stated Goals Increase core strength/ pain management.     Currently in Pain? Yes   Pain Score 2    Pain Location Groin   Pain Orientation Right   Pain Descriptors / Indicators Aching        There.ex.:  See new HEP (scanned in pictures)- issued GTB.  Standing hip flexion/ abd./ extension with knee B knee extension in //-bars 20x each.  Cuing to avoid R lateral trunk lean.   Manual tx.: Supine R LE/ lumbar stretches (5 min.).  B hip adductor stretches (added contract-relax technique with no increase c/o pain) with static holds as tolerated.  STM to R hip /lumbar region in L sidelying/ prone posture.  R LAD in supine position 3x.  No prone manual tx. Today.         PT Long Term Goals - 12/28/16 2009      PT LONG TERM GOAL #1   Title Pt. independent with HEP to increase R LE/core strength to WNL to improve pain-free mobility.     Baseline R LE muscle strength grossly 4+/5 MMT except hip flexion/ adduction 4/5 MMT.  L LE muscle weakness  (post-Polio syndrome   Time 4   Period Weeks   Status New   Target Date 02/21/17     PT LONG TERM GOAL #2   Title Pt. will decrease MODI to <30% to improve pain-free functional mobility.    Baseline MODI: 38% on 8/27   Time 4   Period Weeks   Status New   Target Date 02/21/17     PT LONG TERM GOAL #3   Title Pt. will report <2/10 R low back/groin pain with household ambulation to improve pain-free mobility.    Baseline >3/10 R hip/groin/low back pain at rest and >5/10 pain at worst.    Time 4   Period Weeks   Status New   Target Date 02/21/17     PT LONG TERM GOAL #4   Title Pt. will increase LEFS to >45 out of 80 to improve functional mobility.    Baseline LEFS: 33 out of 80.     Time 4   Period Weeks   Status New   Target Date 02/21/17             Plan - 01/18/17 1019    Clinical Impression Statement Increase R hip adductor length during stretches as compared to last tx.  Pt. remains limited with end-range hip abduction due to increase R groin pain.  PT issued several new resisted ex. with good technique demonstrated during tx. session.  No R hip tendenress with palpation.  Pt. cued to limit R lateral trunk lean during L swing through phase of gait.  No c/o back/groin pain during standing ther.ex. in //-bars with L knee brace locked.     Clinical Presentation Stable   Clinical Decision Making Moderate   Rehab Potential Good   PT Frequency 1x / week   PT Duration 4 weeks   PT Treatment/Interventions ADLs/Self Care Home Management;Aquatic Therapy;Therapeutic exercise;Therapeutic activities;Functional mobility training;Patient/family education;Manual techniques;Energy conservation;Passive range of motion;Scar mobilization;Gait training;Stair training;Balance training;Cryotherapy   PT Next Visit Plan Progress HEP   PT Home Exercise Plan TrA ex. program/ stretches (see handouts).       Patient will benefit from skilled therapeutic intervention in order to improve the  following deficits and impairments:  Hypomobility, Decreased activity tolerance, Decreased strength, Impaired UE functional use, Pain, Decreased mobility, Decreased range of motion, Improper body mechanics, Postural dysfunction, Impaired flexibility, Difficulty walking  Visit Diagnosis: Chronic right-sided low back pain with right-sided sciatica  Muscle weakness (generalized)  Gait difficulty     Problem List Patient Active Problem List   Diagnosis Date Noted  . Personal history of tobacco use, presenting hazards to  health 06/13/2016  . Personal history of malignant neoplasm of breast 07/26/2014  . 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  . Lumbosacral spondylosis without myelopathy 04/30/2013  . Subacute lumbar radiculopathy 04/30/2013  . Chronic low back pain 02/19/2013  . 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 Trochanteric bursitis of right hip 04/11/2012  . Sacroiliac joint dysfunction of both sides 07/12/2011   Pura Spice, PT, DPT # [phone] 01/18/2017, 1:02 PM  [facility] [address] [place], [place], [address] Phone: [phone]   Fax:  [phone]  Name: NIZA SODERHOLM MRN: [id number] Date of Birth: 1950/12/10

## 2017-01-25 ENCOUNTER — Encounter: Payer: Self-pay | Admitting: Physical Therapy

## 2017-01-25 ENCOUNTER — Ambulatory Visit: Payer: Medicare Other | Admitting: Physical Therapy

## 2017-01-25 DIAGNOSIS — G8929 Other chronic pain: Secondary | ICD-10-CM

## 2017-01-25 DIAGNOSIS — M5441 Lumbago with sciatica, right side: Secondary | ICD-10-CM | POA: Diagnosis not present

## 2017-01-25 DIAGNOSIS — R269 Unspecified abnormalities of gait and mobility: Secondary | ICD-10-CM

## 2017-01-25 DIAGNOSIS — M6281 Muscle weakness (generalized): Secondary | ICD-10-CM

## 2017-01-25 NOTE — Therapy (Signed)
[facility] [facility] [address] [place], [place], [address] Phone: [phone]   Fax:  [phone]  Physical Therapy Treatment  Patient Details  Name: Andrea [name] MRN: [id number] Date of Birth: 1950-09-11 Referring Provider: Dr. Kurtis Bushman  Encounter Date: 01/25/2017      PT End of Session - 01/26/17 1746    Visit Number 5   Number of Visits 8   Date for PT Re-Evaluation 01/26/17   Authorization - Visit Number 5   Authorization - Number of Visits 14   PT Start Time 4818   PT Stop Time 1132   PT Time Calculation (min) 56 min   Activity Tolerance Patient tolerated treatment well;No increased pain   Behavior During Therapy [facility] for tasks assessed/performed      Past Medical History:  Diagnosis Date  . Breast cancer ([place]) 2008   RT LUMPECTOMY  . Breast cancer ([place]) 2011   RT MASTECTOMY  . 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 Cervical post-laminectomy syndrome   . Complication of anesthesia    itching real bad or msucles spasms  . Cubital tunnel syndrome   . Disorders of sacrum   . Disturbance of skin sensation   . Hypertension   . Late effects of acute poliomyelitis   . Lateral epicondylitis  of elbow   . Parkinson's disease ([place]) 2011  . Peripheral vascular disease ([place]) 2011   venous stasis   . Personal history of radiation therapy   . Radiation 2008   BREAST CA  . Unspecified musculoskeletal disorders and symptoms referable to neck    cervical/trapezius    Past Surgical History:  Procedure Laterality Date  . ABDOMINAL HYSTERECTOMY  1997  . BREAST LUMPECTOMY Right 2008   4 cm area of DCIS, margins less than 1 mm. Treated with wide excision, whole breast radiation  . BREAST SURGERY Right December 04, 2009   Right simple mastectomy, sentinel node biopsy.  Marland Kitchen CARPAL TUNNEL RELEASE    . COLONOSCOPY  [id number]   Dr. Allen Norris, Dr. Vira Agar  . ELBOW SURGERY Left 2011  .  ELBOW SURGERY Left 2015  . HIP SURGERY Right 06/19/12  . LEG SURGERY Right 1958   Corrcetive surgery for polio  . LEG SURGERY Left 1958   corrective surgery for polio  . MASTECTOMY Right 2011   BREAST CA  . SHOULDER ARTHROSCOPY W/ ROTATOR CUFF REPAIR Bilateral [id number]  . SHOULDER ARTHROSCOPY WITH OPEN ROTATOR CUFF REPAIR Left 08/19/2015   Procedure: SHOULDER ARTHROSCOPY WITH  MINI OPEN ROTATOR CUFF REPAIR,SUBACROMIAL DECOMPRESSION, ARTHROSCOPIC BICEPS TENODESIS;  Surgeon: Thornton Park, MD;  Location: [facility] ORS;  Service: Orthopedics;  Laterality: Left;  . SHOULDER SURGERY Left 2014  . SPINE SURGERY    . TOE FUSION Left 1970   little toe fusion    There were no vitals filed for this visit.      Subjective Assessment - 01/26/17 1743    Subjective Pt. reports no new complaints.  R groin pain remains at this time.     Limitations Standing;Lifting;Walking;House hold activities   Diagnostic tests see MRI   Patient Stated Goals Increase core strength/ pain management.     Currently in Pain? Yes   Pain Score 2    Pain Location Groin   Pain Orientation Right   Pain Descriptors / Indicators Aching   Pain Type Chronic pain       There.ex.: Reviewed HEP in  depth.  Standing hip flexion/ abd./ extension with knee B knee extension in //-bars 20x each (mirror feedback to avoid leaning).  Supine ex. Program (hip flexion/ SLR/ bolster bridging).  Standing Airex.   Manual tx.: Supine R LE/ lumbar stretches (8 min.). B hip adductor stretches (added contract-relax technique with no increase c/o pain) with static holds as tolerated. STM to R hip /lumbar region in L sidelying/ prone posture. R LAD in supine position 3x. No prone manual tx. Today.         PT Long Term Goals - 01/26/17 1755      PT LONG TERM GOAL #1   Title Pt. independent with HEP to increase R LE/core strength to WNL to improve pain-free mobility.     Baseline R LE muscle strength grossly 4+/5 MMT except hip flexion  4/5 and adduction 4+/5 MMT.  L LE muscle weakness (post-Polio syndrome)   Time 4   Period Weeks   Status Partially Met   Target Date 02/21/17     PT LONG TERM GOAL #2   Title Pt. will decrease MODI to <30% to improve pain-free functional mobility.    Baseline MODI: 38% on 8/27.   MODI: 34% on 20-Feb-2023   Time 4   Period Weeks   Status Not Met     PT LONG TERM GOAL #3   Title Pt. will report <2/10 R low back/groin pain with household ambulation to improve pain-free mobility.    Baseline >3/10 R hip/groin/low back pain at rest and >5/10 pain at worst.    Time 4   Period Weeks   Status Not Met     PT LONG TERM GOAL #4   Title Pt. will increase LEFS to >45 out of 80 to improve functional mobility.    Baseline LEFS: 33 out of 80.    LEFS: 41 out of 80 on 02/20/23.     Time 4   Period Weeks   Status Partially Met            Plan - 01/26/17 1748    Clinical Impression Statement Pt. has shown slow but consistent progress with LE stretches/ strengthening ex. program.  Pt. continues to have L groin pain/ (+) impingment with hip IR and prolonged walking/standing tasks.  Pt. is independent with HEP and instructed to contact PT if any further issues over next several weeks/ months.     Clinical Presentation Stable   Clinical Decision Making Moderate   Rehab Potential Good   PT Frequency 1x / week   PT Duration 4 weeks   PT Treatment/Interventions ADLs/Self Care Home Management;Aquatic Therapy;Therapeutic exercise;Therapeutic activities;Functional mobility training;Patient/family education;Manual techniques;Energy conservation;Passive range of motion;Scar mobilization;Gait training;Stair training;Balance training;Cryotherapy   PT Next Visit Plan Discharge from PT at this time.  Pt. will continue with HEP.   PT Home Exercise Plan TrA ex. program/ stretches (see handouts).       Patient will benefit from skilled therapeutic intervention in order to improve the following deficits and impairments:   Hypomobility, Decreased activity tolerance, Decreased strength, Impaired UE functional use, Pain, Decreased mobility, Decreased range of motion, Improper body mechanics, Postural dysfunction, Impaired flexibility, Difficulty walking  Visit Diagnosis: Chronic right-sided low back pain with right-sided sciatica  Muscle weakness (generalized)  Gait difficulty       G-Codes - Feb 19, 2017 1758    Functional Assessment Tool Used (Outpatient Only) Clinical impression/ pain/ muscle weakness/ LEFS/ MODI   Functional Limitation Mobility: Walking and moving around   Mobility: Walking  and Moving Around Current Status [phone]) At least 20 percent but less than 40 percent impaired, limited or restricted   Mobility: Walking and Moving Around Goal Status [phone]) At least 20 percent but less than 40 percent impaired, limited or restricted   Mobility: Walking and Moving Around Discharge Status [phone]) At least 20 percent but less than 40 percent impaired, limited or restricted      Problem List Patient Active Problem List   Diagnosis Date Noted  . Personal history of tobacco use, presenting hazards to health 06/13/2016  . Personal history of malignant neoplasm of breast 07/26/2014  . 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  . Lumbosacral spondylosis without myelopathy 04/30/2013  . Subacute lumbar radiculopathy 04/30/2013  . Chronic low back pain 02/19/2013  . 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 Trochanteric bursitis of right hip 04/11/2012  . Sacroiliac joint dysfunction of both sides 07/12/2011   Pura Spice, PT, DPT # [id number] 01/26/2017, 6:01 PM  [facility] [address] [place], [place], [address] Phone: [phone]   Fax:  [phone]  Name: Andrea [name] MRN: [id number] Date of Birth: [birth year]

## 2017-03-04 DIAGNOSIS — R011 Cardiac murmur, unspecified: Secondary | ICD-10-CM | POA: Insufficient documentation

## 2017-03-16 ENCOUNTER — Other Ambulatory Visit: Payer: Self-pay | Admitting: Family Medicine

## 2017-03-16 DIAGNOSIS — R270 Ataxia, unspecified: Secondary | ICD-10-CM

## 2017-03-29 ENCOUNTER — Other Ambulatory Visit
Admission: RE | Admit: 2017-03-29 | Discharge: 2017-03-29 | Disposition: A | Discharge status: Home / Self Care | Payer: Medicare Other | Source: Ambulatory Visit | Attending: Family Medicine | Admitting: Family Medicine

## 2017-03-29 ENCOUNTER — Ambulatory Visit
Admission: RE | Admit: 2017-03-29 | Discharge: 2017-03-29 | Disposition: A | Discharge status: Home / Self Care | Payer: Medicare Other | Source: Ambulatory Visit | Attending: Family Medicine | Admitting: Family Medicine

## 2017-03-29 DIAGNOSIS — R270 Ataxia, unspecified: Secondary | ICD-10-CM | POA: Insufficient documentation

## 2017-03-29 DIAGNOSIS — I6782 Cerebral ischemia: Secondary | ICD-10-CM | POA: Diagnosis not present

## 2017-03-29 DIAGNOSIS — G939 Disorder of brain, unspecified: Secondary | ICD-10-CM | POA: Diagnosis not present

## 2017-03-29 MED ORDER — GADOBENATE DIMEGLUMINE 529 MG/ML IV SOLN
11.0000 mL | Freq: Once | INTRAVENOUS | Status: AC | PRN
  Administered 2017-03-29: 11 mL via INTRAVENOUS

## 2017-04-15 DIAGNOSIS — R270 Ataxia, unspecified: Secondary | ICD-10-CM | POA: Insufficient documentation

## 2017-04-15 DIAGNOSIS — R413 Other amnesia: Secondary | ICD-10-CM | POA: Insufficient documentation

## 2017-05-20 ENCOUNTER — Encounter: Payer: Self-pay | Admitting: *Deleted

## 2017-05-23 ENCOUNTER — Ambulatory Visit: Payer: Medicare Other | Admitting: Anesthesiology

## 2017-05-23 ENCOUNTER — Ambulatory Visit
Admission: RE | Admit: 2017-05-23 | Discharge: 2017-05-23 | Disposition: A | Discharge status: Home / Self Care | Payer: Medicare Other | Source: Ambulatory Visit | Attending: Unknown Physician Specialty | Admitting: Unknown Physician Specialty

## 2017-05-23 ENCOUNTER — Encounter: Payer: Self-pay | Admitting: *Deleted

## 2017-05-23 ENCOUNTER — Encounter
Admission: RE | Disposition: A | Discharge status: Home / Self Care | Payer: Self-pay | Source: Ambulatory Visit | Attending: Unknown Physician Specialty

## 2017-05-23 DIAGNOSIS — F329 Major depressive disorder, single episode, unspecified: Secondary | ICD-10-CM | POA: Diagnosis not present

## 2017-05-23 DIAGNOSIS — Z86010 Personal history of colonic polyps: Secondary | ICD-10-CM | POA: Insufficient documentation

## 2017-05-23 DIAGNOSIS — Z79899 Other long term (current) drug therapy: Secondary | ICD-10-CM | POA: Diagnosis not present

## 2017-05-23 DIAGNOSIS — K640 First degree hemorrhoids: Secondary | ICD-10-CM | POA: Insufficient documentation

## 2017-05-23 DIAGNOSIS — F17210 Nicotine dependence, cigarettes, uncomplicated: Secondary | ICD-10-CM | POA: Diagnosis not present

## 2017-05-23 DIAGNOSIS — G4700 Insomnia, unspecified: Secondary | ICD-10-CM | POA: Diagnosis not present

## 2017-05-23 DIAGNOSIS — I739 Peripheral vascular disease, unspecified: Secondary | ICD-10-CM | POA: Insufficient documentation

## 2017-05-23 DIAGNOSIS — E785 Hyperlipidemia, unspecified: Secondary | ICD-10-CM | POA: Diagnosis not present

## 2017-05-23 DIAGNOSIS — D122 Benign neoplasm of ascending colon: Secondary | ICD-10-CM | POA: Diagnosis not present

## 2017-05-23 DIAGNOSIS — Z9011 Acquired absence of right breast and nipple: Secondary | ICD-10-CM | POA: Diagnosis not present

## 2017-05-23 DIAGNOSIS — E559 Vitamin D deficiency, unspecified: Secondary | ICD-10-CM | POA: Diagnosis not present

## 2017-05-23 DIAGNOSIS — Z1211 Encounter for screening for malignant neoplasm of colon: Secondary | ICD-10-CM | POA: Diagnosis present

## 2017-05-23 DIAGNOSIS — G20 Parkinson's disease: Secondary | ICD-10-CM | POA: Diagnosis not present

## 2017-05-23 DIAGNOSIS — K219 Gastro-esophageal reflux disease without esophagitis: Secondary | ICD-10-CM | POA: Diagnosis not present

## 2017-05-23 DIAGNOSIS — I10 Essential (primary) hypertension: Secondary | ICD-10-CM | POA: Diagnosis not present

## 2017-05-23 DIAGNOSIS — K5730 Diverticulosis of large intestine without perforation or abscess without bleeding: Secondary | ICD-10-CM | POA: Insufficient documentation

## 2017-05-23 HISTORY — DX: Major depressive disorder, single episode, unspecified: F32.9

## 2017-05-23 HISTORY — DX: Hyperlipidemia, unspecified: E78.5

## 2017-05-23 HISTORY — DX: Pain in left shoulder: M25.512

## 2017-05-23 HISTORY — DX: Insomnia, unspecified: G47.00

## 2017-05-23 HISTORY — DX: Other amnesia: R41.3

## 2017-05-23 HISTORY — DX: Personal history of colon polyps, unspecified: Z86.0100

## 2017-05-23 HISTORY — DX: Gastro-esophageal reflux disease without esophagitis: K21.9

## 2017-05-23 HISTORY — DX: Personal history of other diseases of the nervous system and sense organs: Z86.69

## 2017-05-23 HISTORY — DX: Personal history of colonic polyps: Z86.010

## 2017-05-23 HISTORY — PX: COLONOSCOPY WITH PROPOFOL: SHX5780

## 2017-05-23 HISTORY — DX: Menopausal and female climacteric states: N95.1

## 2017-05-23 HISTORY — DX: Tremor, unspecified: R25.1

## 2017-05-23 HISTORY — DX: Vitamin D deficiency, unspecified: E55.9

## 2017-05-23 HISTORY — DX: Flushing: R23.2

## 2017-05-23 HISTORY — DX: Age-related osteoporosis without current pathological fracture: M81.0

## 2017-05-23 HISTORY — DX: Other constipation: K59.09

## 2017-05-23 HISTORY — DX: Depression, unspecified: F32.A

## 2017-05-23 SURGERY — COLONOSCOPY WITH PROPOFOL
Anesthesia: General

## 2017-05-23 MED ORDER — PROPOFOL 10 MG/ML IV BOLUS
INTRAVENOUS | Status: DC | PRN
  Administered 2017-05-23 (×4): 20 mg via INTRAVENOUS

## 2017-05-23 MED ORDER — SODIUM CHLORIDE 0.9 % IV SOLN: INTRAVENOUS | Status: DC

## 2017-05-23 MED ORDER — PROPOFOL 500 MG/50ML IV EMUL
INTRAVENOUS | Status: AC
  Filled 2017-05-23: qty 50

## 2017-05-23 MED ORDER — PROPOFOL 500 MG/50ML IV EMUL
INTRAVENOUS | Status: DC | PRN
  Administered 2017-05-23: 75 ug/kg/min via INTRAVENOUS

## 2017-05-23 MED ORDER — SODIUM CHLORIDE 0.9 % IV SOLN
INTRAVENOUS | Status: DC
  Administered 2017-05-23 (×2): via INTRAVENOUS

## 2017-05-23 MED ORDER — LIDOCAINE HCL (CARDIAC) 20 MG/ML IV SOLN
INTRAVENOUS | Status: DC | PRN
  Administered 2017-05-23: 30 mg via INTRAVENOUS

## 2017-05-23 MED ORDER — LIDOCAINE HCL (PF) 2 % IJ SOLN
INTRAMUSCULAR | Status: AC
  Filled 2017-05-23: qty 10

## 2017-05-23 NOTE — Op Note (Signed)
[facility] Patient Name: Andrea [name] Procedure Date: 05/23/2017 10:03 AM MRN: [id number] Account #: [id number] Date of Birth: 05/09/1950 Admit Type: Outpatient Age: 67 Room: [facility] ENDO ROOM 1 Gender: Female Note Status: Finalized Procedure:            Colonoscopy Indications:          High risk colon cancer surveillance: Personal history                        of colonic polyps Providers:            Manya Silvas, MD Referring MD:         Gayland Curry MD, MD (Referring MD) Medicines:            Propofol per Anesthesia Complications:        No immediate complications. Procedure:            Pre-Anesthesia Assessment:                       - After reviewing the risks and benefits, the patient                        was deemed in satisfactory condition to undergo the                        procedure.                       After obtaining informed consent, the colonoscope was                        passed under direct vision. Throughout the procedure,                        the patient's blood pressure, pulse, and oxygen                        saturations were monitored continuously. The                        Colonoscope was introduced through the anus and                        advanced to the the cecum, identified by appendiceal                        orifice and ileocecal valve. The colonoscopy was                        performed without difficulty. The patient tolerated the                        procedure well. The quality of the bowel preparation                        was adequate to identify polyps. Findings:      A diminutive polyp was found in the ascending colon. The polyp was       sessile. The polyp was removed with a jumbo cold forceps. Resection and       retrieval were complete.      Internal  hemorrhoids were found during endoscopy. The hemorrhoids were       small and Grade I (internal hemorrhoids that do not prolapse).    A few small-mouthed diverticula were found in the sigmoid colon.      The exam was otherwise without abnormality. Impression:           - One diminutive polyp in the ascending colon, removed                        with a jumbo cold forceps. Resected and retrieved.                       - Internal hemorrhoids.                       - Diverticulosis in the sigmoid colon.                       - The examination was otherwise normal. Recommendation:       - Await pathology results. Manya Silvas, MD 05/23/2017 10:56:54 AM This report has been signed electronically. Number of Addenda: 0 Note Initiated On: 05/23/2017 10:03 AM Scope Withdrawal Time: 0 hours 16 minutes 43 seconds  Total Procedure Duration: 0 hours 26 minutes 21 seconds       [facility]

## 2017-05-23 NOTE — Transfer of Care (Signed)
Immediate Anesthesia Transfer of Care Note  Patient: Andrea [name]  Procedure(s) Performed: COLONOSCOPY WITH PROPOFOL (N/A )  Patient Location: PACU and Endoscopy Unit  Anesthesia Type:General  Level of Consciousness: awake, alert  and oriented  Airway & Oxygen Therapy: Patient Spontanous Breathing and Patient connected to nasal cannula oxygen  Post-op Assessment: Report given to RN and Post -op Vital signs reviewed and stable  Post vital signs: Reviewed and stable  Last Vitals:  Vitals:   05/23/17 0940 05/23/17 1055  BP: 133/70 (!) 108/57  Pulse: 80 73  Resp: 16 13  Temp: (!) 36.2 C (!) 35.6 C  SpO2: 100% 99%    Last Pain:  Vitals:   05/23/17 1055  TempSrc: Tympanic         Complications: No apparent anesthesia complications

## 2017-05-23 NOTE — Anesthesia Post-op Follow-up Note (Signed)
Anesthesia QCDR form completed.        

## 2017-05-23 NOTE — Anesthesia Preprocedure Evaluation (Signed)
Anesthesia Evaluation  Patient identified by MRN, date of birth, ID band Patient awake    Reviewed: Allergy & Precautions, NPO status , Patient's Chart, lab work & pertinent test results  History of Anesthesia Complications Negative for: history of anesthetic complications  Airway Mallampati: II  TM Distance: >3 FB Neck ROM: Full    Dental no notable dental hx.    Pulmonary neg sleep apnea, neg COPD, Current Smoker,    breath sounds clear to auscultation- rhonchi (-) wheezing      Cardiovascular hypertension, Pt. on medications (-) CAD, (-) Past MI, (-) Cardiac Stents and (-) CABG  Rhythm:Regular Rate:Normal - Systolic murmurs and - Diastolic murmurs    Neuro/Psych PSYCHIATRIC DISORDERS Depression negative neurological ROS     GI/Hepatic Neg liver ROS, GERD  ,  Endo/Other  negative endocrine ROSneg diabetes  Renal/GU negative Renal ROS     Musculoskeletal  (+) Arthritis ,   Abdominal (+) - obese,   Peds  Hematology negative hematology ROS (+)   Anesthesia Other Findings Past Medical History: 2008: Breast cancer ([place])     Comment:  RT LUMPECTOMY 2011: Breast cancer ([place])     Comment:  RT MASTECTOMY 2008,2011 : Cancer ([place])     Comment:   High-grade DCIS,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 No date: Cervical post-laminectomy syndrome No date: Chronic constipation No date: Complication of anesthesia     Comment:  itching real bad or msucles spasms No date: Cubital tunnel syndrome No date: Depression No date: Disorders of sacrum No date: Disturbance of skin sensation No date: GERD (gastroesophageal reflux disease) No date: History of colon polyps No date: Hot flashes No date: Hx of diplopia     Comment:  right eye No date: Hyperlipidemia No date: Hypertension No date: Insomnia No date: Late effects of acute poliomyelitis No date: Lateral epicondylitis  of elbow No date: Loss of  memory No date: Menopausal state No date: Osteoporosis No date: Pain in joint of left shoulder 2011: Parkinson's disease ([place]) 2011: Peripheral vascular disease ([place])     Comment:  venous stasis  No date: Personal history of radiation therapy 2008: Radiation     Comment:  BREAST CA No date: Tremor No date: Unspecified musculoskeletal disorders and symptoms referable  to neck     Comment:  cervical/trapezius No date: Vitamin D deficiency   Reproductive/Obstetrics                             Anesthesia Physical Anesthesia Plan  ASA: III  Anesthesia Plan: General   Post-op Pain Management:    Induction: Intravenous  PONV Risk Score and Plan: 2 and Propofol infusion  Airway Management Planned: Natural Airway  Additional Equipment:   Intra-op Plan:   Post-operative Plan:   Informed Consent: I have reviewed the patients History and Physical, chart, labs and discussed the procedure including the risks, benefits and alternatives for the proposed anesthesia with the patient or authorized representative who has indicated his/her understanding and acceptance.   Dental advisory given  Plan Discussed with: CRNA and Anesthesiologist  Anesthesia Plan Comments:         Anesthesia Quick Evaluation

## 2017-05-23 NOTE — Anesthesia Postprocedure Evaluation (Signed)
Anesthesia Post Note  Patient: TAZIAH DIFATTA  Procedure(s) Performed: COLONOSCOPY WITH PROPOFOL (N/A )  Patient location during evaluation: Endoscopy Anesthesia Type: General Level of consciousness: awake and alert and oriented Pain management: pain level controlled Vital Signs Assessment: post-procedure vital signs reviewed and stable Respiratory status: spontaneous breathing, nonlabored ventilation and respiratory function stable Cardiovascular status: blood pressure returned to baseline and stable Postop Assessment: no signs of nausea or vomiting Anesthetic complications: no     Last Vitals:  Vitals:   05/23/17 1115 05/23/17 1125  BP: 117/67 (!) 139/59  Pulse: 79 70  Resp: 19 (!) 21  Temp:    SpO2: 99% 99%    Last Pain:  Vitals:   05/23/17 1055  TempSrc: Tympanic                 Desteni Piscopo

## 2017-05-23 NOTE — H&P (Signed)
Primary Care Physician:  Gayland Curry, MD Primary Gastroenterologist:  Dr. Vira Agar  Pre-Procedure History & Physical: HPI:  Andrea [name] is a 67 y.o. female is here for an colonoscopy.   Past Medical History:  Diagnosis Date  . Breast cancer ([place]) 2008   RT LUMPECTOMY  . Breast cancer ([place]) 2011   RT MASTECTOMY  . 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 Cervical post-laminectomy syndrome   . Chronic constipation   . Complication of anesthesia    itching real bad or msucles spasms  . Cubital tunnel syndrome   . Depression   . Disorders of sacrum   . Disturbance of skin sensation   . GERD (gastroesophageal reflux disease)   . History of colon polyps   . Hot flashes   . Hx of diplopia    right eye  . Hyperlipidemia   . Hypertension   . Insomnia   . Late effects of acute poliomyelitis   . Lateral epicondylitis  of elbow   . Loss of memory   . Menopausal state   . Osteoporosis   . Pain in joint of left shoulder   . Parkinson's disease ([place]) 2011  . Peripheral vascular disease ([place]) 2011   venous stasis   . Personal history of radiation therapy   . Radiation 2008   BREAST CA  . Tremor   . Unspecified musculoskeletal disorders and symptoms referable to neck    cervical/trapezius  . Vitamin D deficiency     Past Surgical History:  Procedure Laterality Date  . ABDOMINAL HYSTERECTOMY  1997  . BREAST LUMPECTOMY Right 2008   4 cm area of DCIS, margins less than 1 mm. Treated with wide excision, whole breast radiation  . BREAST SURGERY Right December 04, 2009   Right simple mastectomy, sentinel node biopsy.  Marland Kitchen CARPAL TUNNEL RELEASE    . COLONOSCOPY  [id number]   Dr. Allen Norris, Dr. Vira Agar  . COLONOSCOPY    . ELBOW SURGERY Left 2011  . ELBOW SURGERY Left 2015  . ESOPHAGOGASTRODUODENOSCOPY    . HIP SURGERY Right 06/19/12  . LEG SURGERY Right 1958   Corrcetive surgery for polio  . LEG SURGERY Left 1958   corrective  surgery for polio  . MASTECTOMY Right 2011   BREAST CA  . rectocele/enterocelle repair and perinoplasty    . SHOULDER ARTHROSCOPY W/ ROTATOR CUFF REPAIR Bilateral [id number]  . SHOULDER ARTHROSCOPY WITH OPEN ROTATOR CUFF REPAIR Left 08/19/2015   Procedure: SHOULDER ARTHROSCOPY WITH  MINI OPEN ROTATOR CUFF REPAIR,SUBACROMIAL DECOMPRESSION, ARTHROSCOPIC BICEPS TENODESIS;  Surgeon: Thornton Park, MD;  Location: [facility] ORS;  Service: Orthopedics;  Laterality: Left;  . SHOULDER SURGERY Left 2014  . SPINE SURGERY    . TOE FUSION Left 1970   little toe fusion  . TONSILLECTOMY      Prior to Admission medications   Medication Sig Start Date End Date Taking? Authorizing Provider  amLODipine (NORVASC) 10 MG tablet Take 5 mg by mouth every morning.    Yes [provider]  cholecalciferol (VITAMIN D) 1000 UNITS tablet Take 2,000 Units by mouth every morning.    Yes [provider]  Cyanocobalamin (B-12) 1000 MCG TABS Take 1 tablet by mouth daily.   Yes [provider]  EST ESTROGENS-METHYLTEST DS 1.25-2.5 MG TABS Take 1.25-2.5 mg by mouth daily. Patient taking differently: Take 1.25-2.5 mg by mouth every 3 (three) days. 1 tablet every 3rd day. 04/07/15  Yes  Defrancesco, Alanda Slim, MD  HYDROcodone-acetaminophen (NORCO) 10-325 MG tablet Take 1 tablet by mouth every 6 (six) hours as needed.   Yes [provider]  lidocaine (LIDODERM) 5 % Place 1 patch onto the skin daily as needed. Remove & Discard patch within 12 hours or as directed by MD   Yes [provider]  losartan (COZAAR) 50 MG tablet Take 50 mg by mouth every morning.    Yes [provider]  meloxicam (MOBIC) 7.5 MG tablet Take 7.5 mg by mouth daily.   Yes [provider]  omeprazole (PRILOSEC) 40 MG capsule Take 1 capsule by mouth every morning.  01/26/12  Yes [provider]  pregabalin (LYRICA) 50 MG capsule Take 50 mg by mouth 3 (three) times daily.   Yes [provider]  tiZANidine (ZANAFLEX) 4 MG tablet 3 (three) times daily.  07/20/15  Yes [provider]  traZODone (DESYREL) 50 MG tablet Take 50 mg by mouth at bedtime. 1-2 tablets as needed for sleep.   Yes [provider]  diphenhydrAMINE (BENADRYL) 50 MG tablet Take 50 mg by mouth at bedtime as needed for sleep.    [provider]  polyethylene glycol (MIRALAX / GLYCOLAX) packet Take 17 g by mouth as needed.    [provider]    Allergies as of 12/27/2016 - Review Complete 12/27/2016  Allergen Reaction Noted  . Lisinopril  05/24/2011    Family History  Problem Relation Age of Onset  . Cancer Mother   . Cancer Father   . Breast cancer Neg Hx     Social History   Socioeconomic History  . Marital status: Married    Spouse name: Not on file  . Number of children: Not on file  . Years of education: Not on file  . Highest education level: Not on file  Social Needs  . Financial resource strain: Not on file  . Food insecurity - worry: Not on file  . Food insecurity - inability: Not on file  . Transportation needs - medical: Not on file  . Transportation needs - non-medical: Not on file  Occupational History  . Not on file  Tobacco Use  . Smoking status: Current Every Day Smoker    Packs/day: 0.50    Years: 50.00    Pack years: 25.00    Types: Cigarettes  . Smokeless tobacco: Never Used  Substance and Sexual Activity  . Alcohol use: No    Comment: No alcohol in the last 1.5 years  . Drug use: No  . Sexual activity: Not on file  Other Topics Concern  . Not on file  Social History Narrative  . Not on file    Review of Systems: See HPI, otherwise negative ROS  Physical Exam: BP 133/70   Pulse 80   Temp (!) 97.2 F (36.2 C) (Tympanic)   Resp 16   Ht 4\' 11"  (1.499 m)   Wt 51.7 kg (114 lb)   SpO2 100%   BMI 23.03 kg/m  General:   Alert,  pleasant and cooperative in NAD Head:  Normocephalic and atraumatic. Neck:  Supple; no masses or  thyromegaly. Lungs:  Clear throughout to auscultation.    Heart:  Regular rate and rhythm. Abdomen:  Soft, nontender and nondistended. Normal bowel sounds, without guarding, and without rebound.   Neurologic:  Alert and  oriented x4;  grossly normal neurologically.  Impression/Plan: Andrea [name] is here for an colonoscopy to be performed for [facility] colon  polyps  Risks, benefits, limitations, and alternatives regarding  colonoscopy have been reviewed with the patient.  Questions have been answered.  All parties agreeable.   Gaylyn Cheers, MD  05/23/2017, 10:19 AM

## 2017-05-24 ENCOUNTER — Encounter: Payer: Self-pay | Admitting: Unknown Physician Specialty

## 2017-05-24 LAB — SURGICAL PATHOLOGY

## 2017-06-01 ENCOUNTER — Other Ambulatory Visit: Payer: Self-pay

## 2017-06-01 DIAGNOSIS — Z1231 Encounter for screening mammogram for malignant neoplasm of breast: Secondary | ICD-10-CM

## 2017-06-06 ENCOUNTER — Encounter: Payer: Self-pay | Admitting: *Deleted

## 2017-06-06 ENCOUNTER — Telehealth: Payer: Self-pay | Admitting: *Deleted

## 2017-06-06 DIAGNOSIS — Z87891 Personal history of nicotine dependence: Secondary | ICD-10-CM

## 2017-06-06 DIAGNOSIS — Z122 Encounter for screening for malignant neoplasm of respiratory organs: Secondary | ICD-10-CM

## 2017-06-06 NOTE — Telephone Encounter (Signed)
Notified patient that annual lung cancer screening low dose CT scan is due currently or will be in near future. Confirmed that patient is within the age range of 55-77, and asymptomatic, (no signs or symptoms of lung cancer). Patient denies illness that would prevent curative treatment for lung cancer if found. Verified smoking history, (57.25 pack year). The shared decision making visit was done 06/08/16. Patient is agreeable for CT scan being scheduled.

## 2017-06-13 ENCOUNTER — Ambulatory Visit
Admission: RE | Admit: 2017-06-13 | Discharge: 2017-06-13 | Disposition: A | Discharge status: Home / Self Care | Payer: Medicare Other | Source: Ambulatory Visit | Attending: Nurse Practitioner | Admitting: Nurse Practitioner

## 2017-06-13 DIAGNOSIS — I700 Atherosclerosis of aorta: Secondary | ICD-10-CM | POA: Diagnosis not present

## 2017-06-13 DIAGNOSIS — Z9011 Acquired absence of right breast and nipple: Secondary | ICD-10-CM | POA: Diagnosis not present

## 2017-06-13 DIAGNOSIS — Z87891 Personal history of nicotine dependence: Secondary | ICD-10-CM

## 2017-06-13 DIAGNOSIS — J439 Emphysema, unspecified: Secondary | ICD-10-CM | POA: Insufficient documentation

## 2017-06-13 DIAGNOSIS — Z122 Encounter for screening for malignant neoplasm of respiratory organs: Secondary | ICD-10-CM

## 2017-06-20 ENCOUNTER — Telehealth: Payer: Self-pay | Admitting: *Deleted

## 2017-06-20 NOTE — Telephone Encounter (Signed)
Notified patient of LDCT lung cancer screening program results with recommendation for 12 month follow up imaging. Also notified of incidental findings noted below and is encouraged to discuss further with PCP who will receive a copy of this note and/or the CT report. Patient verbalizes understanding.   IMPRESSION: 1. Lung-RADS 2, benign appearance or behavior. Continue annual screening with low-dose chest CT without contrast in 12 months. 2. Aortic atherosclerosis (ICD10-I70.0) and emphysema (ICD10-J43.9). 3. Right mastectomy.

## 2017-07-18 ENCOUNTER — Ambulatory Visit
Admission: RE | Admit: 2017-07-18 | Discharge: 2017-07-18 | Disposition: A | Discharge status: Home / Self Care | Payer: Medicare Other | Source: Ambulatory Visit | Attending: General Surgery | Admitting: General Surgery

## 2017-07-18 DIAGNOSIS — Z1231 Encounter for screening mammogram for malignant neoplasm of breast: Secondary | ICD-10-CM | POA: Diagnosis not present

## 2017-07-26 ENCOUNTER — Encounter: Payer: Self-pay | Admitting: General Surgery

## 2017-07-26 ENCOUNTER — Ambulatory Visit (INDEPENDENT_AMBULATORY_CARE_PROVIDER_SITE_OTHER): Payer: Medicare Other | Admitting: General Surgery

## 2017-07-26 VITALS — BP 120/68 | HR 60 | Resp 14 | Ht 60.0 in | Wt 117.0 lb

## 2017-07-26 DIAGNOSIS — D0511 Intraductal carcinoma in situ of right breast: Secondary | ICD-10-CM | POA: Diagnosis not present

## 2017-07-26 NOTE — Patient Instructions (Signed)
  The patient has been asked to return to the office in one year with a left screening mammogram. The patient is aware to call back for any questions or concerns.The patient is aware to use a heating pad as needed for comfort.

## 2017-07-26 NOTE — Progress Notes (Signed)
Patient ID: Andrea [name], female   DOB: Aug 18, 1950, 67 y.o.   MRN: [id number]  Chief Complaint  Patient presents with  . Follow-up    HPI Andrea [name] is a 67 y.o. female who presents for a breast evaluation. The most recent left breast  mammogram was done on 07/18/2017.  Patient does perform regular self breast checks and gets regular mammograms done.   Patient states she gets some pains on her right mastectomy site.   HPI   Past Medical History:  Diagnosis Date  . Breast cancer ([place]) 2008   RT LUMPECTOMY  . Breast cancer ([place]) 2011   RT MASTECTOMY  . 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 Cervical post-laminectomy syndrome   . Chronic constipation   . Complication of anesthesia    itching real bad or msucles spasms  . Cubital tunnel syndrome   . Depression   . Disorders of sacrum   . Disturbance of skin sensation   . GERD (gastroesophageal reflux disease)   . History of colon polyps   . Hot flashes   . Hx of diplopia    right eye  . Hyperlipidemia   . Hypertension   . Insomnia   . Late effects of acute poliomyelitis   . Lateral epicondylitis  of elbow   . Loss of memory   . Menopausal state   . Osteoporosis   . Pain in joint of left shoulder   . Parkinson's disease ([place]) 2011  . Peripheral vascular disease ([place]) 2011   venous stasis   . Personal history of radiation therapy   . Radiation 2008   BREAST CA  . Tremor   . Unspecified musculoskeletal disorders and symptoms referable to neck    cervical/trapezius  . Vitamin D deficiency     Past Surgical History:  Procedure Laterality Date  . ABDOMINAL HYSTERECTOMY  1997  . BREAST LUMPECTOMY Right 2008   4 cm area of DCIS, margins less than 1 mm. Treated with wide excision, whole breast radiation  . BREAST SURGERY Right December 04, 2009   Right simple mastectomy, sentinel node biopsy.  Marland Kitchen CARPAL TUNNEL RELEASE    . COLONOSCOPY  [id number]   Dr. Allen Norris, Dr. Vira Agar   . COLONOSCOPY    . COLONOSCOPY WITH PROPOFOL N/A 05/23/2017   Procedure: COLONOSCOPY WITH PROPOFOL;  Surgeon: Manya Silvas, MD;  Location: [facility] ENDOSCOPY;  Service: Endoscopy;  Laterality: N/A;  . ELBOW SURGERY Left 2011  . ELBOW SURGERY Left 2015  . ESOPHAGOGASTRODUODENOSCOPY    . HIP SURGERY Right 06/19/12  . LEG SURGERY Right 1958   Corrcetive surgery for polio  . LEG SURGERY Left 1958   corrective surgery for polio  . MASTECTOMY Right 2011   BREAST CA  . rectocele/enterocelle repair and perinoplasty    . SHOULDER ARTHROSCOPY W/ ROTATOR CUFF REPAIR Bilateral [id number]  . SHOULDER ARTHROSCOPY WITH OPEN ROTATOR CUFF REPAIR Left 08/19/2015   Procedure: SHOULDER ARTHROSCOPY WITH  MINI OPEN ROTATOR CUFF REPAIR,SUBACROMIAL DECOMPRESSION, ARTHROSCOPIC BICEPS TENODESIS;  Surgeon: Thornton Park, MD;  Location: [facility] ORS;  Service: Orthopedics;  Laterality: Left;  . SHOULDER SURGERY Left 2014  . SPINE SURGERY    . TOE FUSION Left 1970   little toe fusion  . TONSILLECTOMY      Family History  Problem Relation Age of Onset  . Cancer Mother   . Cancer Father   . Breast cancer Other 42  Social History Social History   Tobacco Use  . Smoking status: Former Smoker    Packs/day: 0.50    Years: 50.00    Pack years: 25.00    Types: Cigarettes    Last attempt to quit: 07/18/2017    Years since quitting: 0.0  . Smokeless tobacco: Never Used  Substance Use Topics  . Alcohol use: No    Comment: No alcohol in the last 1.5 years  . Drug use: No    Allergies  Allergen Reactions  . Lisinopril     Raises BP    Current Outpatient Medications  Medication Sig Dispense Refill  . amLODipine (NORVASC) 10 MG tablet Take 5 mg by mouth every morning.     . cholecalciferol (VITAMIN D) 1000 UNITS tablet Take 2,000 Units by mouth every morning.     . Cyanocobalamin (B-12) 1000 MCG TABS Take 1 tablet by mouth daily.    . diphenhydrAMINE (BENADRYL) 50 MG tablet Take 50 mg by mouth at  bedtime as needed for sleep.    Marland Kitchen EST ESTROGENS-METHYLTEST DS 1.25-2.5 MG TABS Take 1.25-2.5 mg by mouth daily. (Patient taking differently: Take 1.25-2.5 mg by mouth every 3 (three) days. 1 tablet every 3rd day.) 30 each 6  . HYDROcodone-acetaminophen (NORCO) 10-325 MG tablet Take 1 tablet by mouth every 6 (six) hours as needed.    . lidocaine (LIDODERM) 5 % Place 1 patch onto the skin daily as needed. Remove & Discard patch within 12 hours or as directed by MD    . losartan (COZAAR) 50 MG tablet Take 50 mg by mouth every morning.     . meloxicam (MOBIC) 7.5 MG tablet Take 7.5 mg by mouth daily.    Marland Kitchen omeprazole (PRILOSEC) 40 MG capsule Take 1 capsule by mouth every morning.     . polyethylene glycol (MIRALAX / GLYCOLAX) packet Take 17 g by mouth as needed.    . pregabalin (LYRICA) 50 MG capsule Take 50 mg by mouth 3 (three) times daily.    Marland Kitchen tiZANidine (ZANAFLEX) 4 MG tablet 3 (three) times daily.     . traZODone (DESYREL) 50 MG tablet Take 50 mg by mouth at bedtime. 1-2 tablets as needed for sleep.     No current facility-administered medications for this visit.     Review of Systems Review of Systems  Blood pressure 120/68, pulse 60, resp. rate 14, height 5' (1.524 m), weight 117 lb (53.1 kg).  Physical Exam Physical Exam  Constitutional: She is oriented to person, place, and time. She appears well-developed and well-nourished.  Eyes: Conjunctivae are normal. No scleral icterus.  Neck: Neck supple.  Cardiovascular: Normal rate, regular rhythm and normal heart sounds.  Pulmonary/Chest: Effort normal and breath sounds normal. Left breast exhibits no inverted nipple, no mass, no nipple discharge, no skin change and no tenderness.    Right mastectomy site is well healed.   Lymphadenopathy:    She has no cervical adenopathy.    She has no axillary adenopathy.  Neurological: She is alert and oriented to person, place, and time.  Skin: Skin is warm and dry.    Data Reviewed Left  breast screening mammogram of July 18, 2017 reviewed.  BI-RADS-1.  Assessment    No evidence of recurrent cancer.    Plan   The patient has been asked to return to the office in one year with a left screening mammogram. The patient is aware to call back for any questions or concerns.The patient is aware to  use a heating pad as needed for comfort.   HPI, Physical Exam, Assessment and Plan have been scribed under the direction and in the presence of Hervey Ard, MD.  Gaspar Cola, CMA  I have completed the exam and reviewed the above documentation for accuracy and completeness.  I agree with the above.  Haematologist has been used and any errors in dictation or transcription are unintentional.  Hervey Ard, M.D., F.A.C.S.  Forest Gleason Kesia Dalto 07/27/2017, 7:48 PM

## 2017-09-07 ENCOUNTER — Other Ambulatory Visit: Payer: Self-pay | Admitting: Family Medicine

## 2017-09-07 DIAGNOSIS — R9089 Other abnormal findings on diagnostic imaging of central nervous system: Secondary | ICD-10-CM

## 2017-09-07 DIAGNOSIS — M810 Age-related osteoporosis without current pathological fracture: Secondary | ICD-10-CM

## 2017-09-15 ENCOUNTER — Ambulatory Visit
Admission: RE | Admit: 2017-09-15 | Discharge: 2017-09-15 | Disposition: A | Discharge status: Home / Self Care | Payer: Medicare Other | Source: Ambulatory Visit | Attending: Family Medicine | Admitting: Family Medicine

## 2017-09-15 DIAGNOSIS — M8588 Other specified disorders of bone density and structure, other site: Secondary | ICD-10-CM | POA: Diagnosis not present

## 2017-09-15 DIAGNOSIS — M810 Age-related osteoporosis without current pathological fracture: Secondary | ICD-10-CM

## 2017-09-15 DIAGNOSIS — I10 Essential (primary) hypertension: Secondary | ICD-10-CM | POA: Insufficient documentation

## 2017-09-19 ENCOUNTER — Ambulatory Visit
Admission: RE | Admit: 2017-09-19 | Discharge: 2017-09-19 | Disposition: A | Discharge status: Home / Self Care | Payer: Medicare Other | Source: Ambulatory Visit | Attending: Family Medicine | Admitting: Family Medicine

## 2017-09-19 DIAGNOSIS — G9389 Other specified disorders of brain: Secondary | ICD-10-CM | POA: Diagnosis not present

## 2017-09-19 DIAGNOSIS — R9089 Other abnormal findings on diagnostic imaging of central nervous system: Secondary | ICD-10-CM | POA: Insufficient documentation

## 2017-09-19 MED ORDER — GADOBENATE DIMEGLUMINE 529 MG/ML IV SOLN
10.0000 mL | Freq: Once | INTRAVENOUS | Status: AC | PRN
  Administered 2017-09-19: 10 mL via INTRAVENOUS

## 2017-09-29 DIAGNOSIS — M169 Osteoarthritis of hip, unspecified: Secondary | ICD-10-CM | POA: Insufficient documentation

## 2018-05-03 DIAGNOSIS — J439 Emphysema, unspecified: Secondary | ICD-10-CM

## 2018-05-03 HISTORY — DX: Emphysema, unspecified: J43.9

## 2018-06-02 ENCOUNTER — Telehealth: Payer: Self-pay

## 2018-06-02 NOTE — Telephone Encounter (Signed)
Call pt regarding lung screening. Pt is a current smoker. Pt smokes 1/2 to 1 pack per day. She would like scan to be the same day as her spouse. Pt unable to have scan on Feb. 3rd or  18 th. Pt denies any new health issues at this time.

## 2018-06-06 ENCOUNTER — Telehealth: Payer: Self-pay | Admitting: *Deleted

## 2018-06-06 ENCOUNTER — Encounter: Payer: Self-pay | Admitting: *Deleted

## 2018-06-06 DIAGNOSIS — Z122 Encounter for screening for malignant neoplasm of respiratory organs: Secondary | ICD-10-CM

## 2018-06-06 NOTE — Telephone Encounter (Signed)
Called pt to inform of her appt for ldct screening on Friday 06/16/2018 @ 10:10am here @ [facility].voiced understanding.

## 2018-06-06 NOTE — Telephone Encounter (Signed)
Patient has been notified that the annual lung cancer screening low dose CT scan is due currently or will be in the near future.  Confirmed that the patient is within the age range of 44-80, and asymptomatic, and currently exhibits no signs or symptoms of lung cancer.  Patient denies illness that would prevent curative treatment for lung cancer if found.  Verified smoking history, current smoker 1 ppd with 58.25pkyr hx .  The shared decision making visit was completed on 06-08-16.  Patient is agreeable for the CT scan to be scheduled.  Will call patient back with date and time of appointment.

## 2018-06-09 ENCOUNTER — Other Ambulatory Visit: Payer: Self-pay

## 2018-06-09 DIAGNOSIS — Z1231 Encounter for screening mammogram for malignant neoplasm of breast: Secondary | ICD-10-CM

## 2018-06-16 ENCOUNTER — Ambulatory Visit
Admission: RE | Admit: 2018-06-16 | Discharge: 2018-06-16 | Disposition: A | Discharge status: Home / Self Care | Payer: Medicare Other | Source: Ambulatory Visit | Attending: Oncology | Admitting: Oncology

## 2018-06-16 DIAGNOSIS — Z122 Encounter for screening for malignant neoplasm of respiratory organs: Secondary | ICD-10-CM | POA: Insufficient documentation

## 2018-06-19 ENCOUNTER — Encounter: Payer: Self-pay | Admitting: *Deleted

## 2018-06-24 ENCOUNTER — Encounter: Payer: Self-pay | Admitting: *Deleted

## 2018-06-30 ENCOUNTER — Encounter: Payer: Self-pay | Admitting: *Deleted

## 2018-07-05 ENCOUNTER — Other Ambulatory Visit: Payer: Self-pay | Admitting: Family Medicine

## 2018-07-05 DIAGNOSIS — M8580 Other specified disorders of bone density and structure, unspecified site: Secondary | ICD-10-CM

## 2018-07-05 DIAGNOSIS — M816 Localized osteoporosis [Lequesne]: Secondary | ICD-10-CM

## 2018-07-05 DIAGNOSIS — M859 Disorder of bone density and structure, unspecified: Secondary | ICD-10-CM

## 2018-07-21 ENCOUNTER — Inpatient Hospital Stay: Payer: Medicare Other

## 2018-07-21 ENCOUNTER — Inpatient Hospital Stay: Payer: Medicare Other | Attending: Hematology and Oncology | Admitting: Hematology and Oncology

## 2018-07-21 ENCOUNTER — Encounter: Payer: Self-pay | Admitting: Hematology and Oncology

## 2018-07-21 ENCOUNTER — Other Ambulatory Visit: Payer: Self-pay

## 2018-07-21 VITALS — BP 113/72 | HR 63 | Temp 97.1°F | Resp 18 | Ht 60.0 in | Wt 116.4 lb

## 2018-07-21 DIAGNOSIS — Z9011 Acquired absence of right breast and nipple: Secondary | ICD-10-CM | POA: Diagnosis not present

## 2018-07-21 DIAGNOSIS — D7589 Other specified diseases of blood and blood-forming organs: Secondary | ICD-10-CM | POA: Diagnosis present

## 2018-07-21 DIAGNOSIS — F17210 Nicotine dependence, cigarettes, uncomplicated: Secondary | ICD-10-CM

## 2018-07-21 DIAGNOSIS — Z923 Personal history of irradiation: Secondary | ICD-10-CM | POA: Diagnosis not present

## 2018-07-21 DIAGNOSIS — R718 Other abnormality of red blood cells: Secondary | ICD-10-CM | POA: Diagnosis not present

## 2018-07-21 DIAGNOSIS — Z809 Family history of malignant neoplasm, unspecified: Secondary | ICD-10-CM | POA: Diagnosis not present

## 2018-07-21 DIAGNOSIS — R888 Abnormal findings in other body fluids and substances: Secondary | ICD-10-CM

## 2018-07-21 DIAGNOSIS — Z86000 Personal history of in-situ neoplasm of breast: Secondary | ICD-10-CM | POA: Insufficient documentation

## 2018-07-21 DIAGNOSIS — I10 Essential (primary) hypertension: Secondary | ICD-10-CM | POA: Diagnosis not present

## 2018-07-21 DIAGNOSIS — D7289 Other specified disorders of white blood cells: Secondary | ICD-10-CM

## 2018-07-21 DIAGNOSIS — E538 Deficiency of other specified B group vitamins: Secondary | ICD-10-CM

## 2018-07-21 LAB — CBC WITH DIFFERENTIAL/PLATELET
Abs Immature Granulocytes: 0.01 10*3/uL (ref 0.00–0.07)
Basophils Absolute: 0.1 10*3/uL (ref 0.0–0.1)
Basophils Relative: 1 %
Eosinophils Absolute: 0.2 10*3/uL (ref 0.0–0.5)
Eosinophils Relative: 4 %
HCT: 42.1 % (ref 36.0–46.0)
Hemoglobin: 14.1 g/dL (ref 12.0–15.0)
Immature Granulocytes: 0 %
Lymphocytes Relative: 35 %
Lymphs Abs: 1.5 10*3/uL (ref 0.7–4.0)
MCH: 31.2 pg (ref 26.0–34.0)
MCHC: 33.5 g/dL (ref 30.0–36.0)
MCV: 93.1 fL (ref 80.0–100.0)
Monocytes Absolute: 0.3 10*3/uL (ref 0.1–1.0)
Monocytes Relative: 8 %
Neutro Abs: 2.2 10*3/uL (ref 1.7–7.7)
Neutrophils Relative %: 52 %
Platelets: 224 10*3/uL (ref 150–400)
RBC: 4.52 MIL/uL (ref 3.87–5.11)
RDW: 14 % (ref 11.5–15.5)
WBC: 4.2 10*3/uL (ref 4.0–10.5)
nRBC: 0 % (ref 0.0–0.2)

## 2018-07-21 LAB — COMPREHENSIVE METABOLIC PANEL
ALT: 37 U/L (ref 0–44)
AST: 34 U/L (ref 15–41)
Albumin: 4 g/dL (ref 3.5–5.0)
Alkaline Phosphatase: 86 U/L (ref 38–126)
Anion gap: 5 (ref 5–15)
BUN: 14 mg/dL (ref 8–23)
CO2: 28 mmol/L (ref 22–32)
Calcium: 9.6 mg/dL (ref 8.9–10.3)
Chloride: 104 mmol/L (ref 98–111)
Creatinine, Ser: 0.57 mg/dL (ref 0.44–1.00)
GFR calc Af Amer: >60 mL/min (ref >60)
GFR calc non Af Amer: >60 mL/min (ref >60)
Glucose, Bld: 84 mg/dL (ref 70–99)
Potassium: 4.1 mmol/L (ref 3.5–5.1)
Sodium: 137 mmol/L (ref 135–145)
Total Bilirubin: 0.6 mg/dL (ref 0.3–1.2)
Total Protein: 6.7 g/dL (ref 6.5–8.1)

## 2018-07-21 LAB — RETICULOCYTES
Immature Retic Fract: 4.1 % (ref 2.3–15.9)
RBC.: 4.49 MIL/uL (ref 3.87–5.11)
Retic Count, Absolute: 49.4 10*3/uL (ref 19.0–186.0)
Retic Ct Pct: 1.1 % (ref 0.4–3.1)

## 2018-07-21 LAB — FOLATE: Folate: 9.1 ng/mL (ref >5.9)

## 2018-07-21 LAB — TSH: TSH: 1.932 u[IU]/mL (ref 0.350–4.500)

## 2018-07-21 LAB — VITAMIN B12: Vitamin B-12: 385 pg/mL (ref 180–914)

## 2018-07-21 NOTE — Progress Notes (Signed)
[facility] day:  07/21/2018  Chief Complaint: NURA CAHOON is a 68 y.o. female with an abnormal blood chemistry who is referred in consultation by Dr. Gayland Curry for assessment and management.   HPI: The patient was seen on 07/03/2018 for an annual wellness visit.  At that time, she felt fine.  She states that she was having some hot flashes coming off her hormones.  She describes being on "high-dose hormones" daily and beginning a taper schedule before that appointment.  She was on HRT every other day.  She was put on a lower dose HRT and Effexor beginning 07/03/2018.  She was on low dose HRT x 1 week.  She is now off HRT.  She denies any sweats or weight loss.  She denies any recent infections.   She notes shortness of breath after smoking but not with exercise.  She has chronic constipation.  She has dry mouth and dry eyes but no diagnosis of Sjogren's.  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  She notes some skin changes over the past 5 to 6 weeks.  She describes some itching on the right side of her scalp which turned into a "big knot" (now gone).  She had another spot on the left side of her scalp well as well as new area of pruritus on her right flank.  She denies any vesicles.  She denies any fevers.  She states that her diet is good.  She has a protein shake in the morning.  She eats meat daily.  She rarely eats green leafy vegetables.  She has supper daily.  She likes to eat chocolate covered almonds, Aggie Hacker, and Zingers every day.  She has a 50 pack year smoking history.  She states that she smokes a half a pack a day when at home but more when she is "out and about".  She has participated in the low-dose chest CT screening program for the past 3 years.   Chest CT on 06/06/2018 revealed Lung-RADS 2.  CBCs have been followed: 12/26/2017:  Hematocrit 39.7,  hemoglobin 13.6, MCV 86, platelets 259,000, WBC 4700 with an ANC of 2900.  DIff:  61% segs, 23% lymphs, 11% monos, 5% eos. 06/28/2018:  Hematocrit 39.8, hemoglobin 13.4, MCV 93, platelets 232,000, WBC 4000 with an ANC of 1500.  Diff:  36% segs, 47% lymphs, 11% monos, 5% eos. 07/03/2018:  Hematocrit 39.7, hemoglobin 13.4, MCV 92, platelets 249,000, WBC 5300 with an ANC of 3200.  Diff:  62% segs, 25% lymphs, 7% monos, 1% eos, 1% baso, 4% variant lymphs.  Peripheral smear on 07/03/2018 revealed macrocytes, burr cells, anisocytosis, and poikilocytosis.  Creatinine was 0.7 on 06/28/2018.  Ferritin was 101, iron saturation 35% and TIBC 285 on 06/28/2018.  B12 was 204 on 02/22/2017 and 2358 on 12/26/2017.  She states that that she has a history of "low iron".  She states that she was on iron pills but stopped after her last visit.  She has a history of B12 deficiency.  She has been off B12 for the past 2 months.  She notes no new medications or herbal products.  She started DHT last year and stopped in 07/2018 as it made her dizzy.  She notes moving into a new single wide trailer on 02/02/2018 with a strong formaldehyde smell.  She developed nausea and headache with exposure.  She has a history of  a breast cancer.  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  DCIS was hormone receptor negative.  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  She is followed by Dr. Bary Castilla.   Past Medical History:  Diagnosis Date  . Breast cancer ([place]) 2008   RT LUMPECTOMY  . Breast cancer ([place]) 2011   RT MASTECTOMY  . 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 Cervical post-laminectomy syndrome   . Chronic constipation   . Complication of anesthesia    itching real bad or msucles spasms  . Cubital tunnel syndrome   . Depression   . Disorders of sacrum   . Disturbance of skin sensation   . GERD  (gastroesophageal reflux disease)   . History of colon polyps   . Hot flashes   . Hx of diplopia    right eye  . Hyperlipidemia   . Hypertension   . Insomnia   . Late effects of acute poliomyelitis   . Lateral epicondylitis  of elbow   . Loss of memory   . Menopausal state   . Osteoporosis   . Pain in joint of left shoulder   . Parkinson's disease ([place]) 2011  . Peripheral vascular disease ([place]) 2011   venous stasis   . Personal history of radiation therapy   . Radiation 2008   BREAST CA  . Tremor   . Unspecified musculoskeletal disorders and symptoms referable to neck    cervical/trapezius  . Vitamin D deficiency     Past Surgical History:  Procedure Laterality Date  . ABDOMINAL HYSTERECTOMY  1997  . BREAST LUMPECTOMY Right 2008   4 cm area of DCIS, margins less than 1 mm. Treated with wide excision, whole breast radiation  . BREAST SURGERY Right December 04, 2009   Right simple mastectomy, sentinel node biopsy.  Marland Kitchen CARPAL TUNNEL RELEASE    . COLONOSCOPY  [id number]   Dr. Allen Norris, Dr. Vira Agar  . COLONOSCOPY    . COLONOSCOPY WITH PROPOFOL N/A 05/23/2017   Procedure: COLONOSCOPY WITH PROPOFOL;  Surgeon: Manya Silvas, MD;  Location: [facility] ENDOSCOPY;  Service: Endoscopy;  Laterality: N/A;  . ELBOW SURGERY Left 2011  . ELBOW SURGERY Left 2015  . ESOPHAGOGASTRODUODENOSCOPY    . HIP SURGERY Right 06/19/12  . LEG SURGERY Right 1958   Corrcetive surgery for polio  . LEG SURGERY Left 1958   corrective surgery for polio  . MASTECTOMY Right 2011   BREAST CA  . rectocele/enterocelle repair and perinoplasty    . SHOULDER ARTHROSCOPY W/ ROTATOR CUFF REPAIR Bilateral [id number]  . SHOULDER ARTHROSCOPY WITH OPEN ROTATOR CUFF REPAIR Left 08/19/2015   Procedure: SHOULDER ARTHROSCOPY WITH  MINI OPEN ROTATOR CUFF REPAIR,SUBACROMIAL DECOMPRESSION, ARTHROSCOPIC BICEPS TENODESIS;  Surgeon: Thornton Park, MD;  Location: [facility] ORS;  Service: Orthopedics;  Laterality: Left;  . SHOULDER SURGERY  Left 2014  . SPINE SURGERY    . TOE FUSION Left 1970   little toe fusion  . TONSILLECTOMY      Family History  Problem Relation Age of Onset  . Cancer Mother   . Cancer Father   . Breast cancer Other 28    Social History:  reports that she has been smoking cigarettes. She has a 25.00 pack-year smoking history. She has never used smokeless tobacco. She reports that she does not drink alcohol or use drugs.  She works a half a pack  a day at home but more when she is "out and about".  He is smoked for the past 50 years.  She denies any alcohol use.  She denies any exposure to radiation or toxins.  She is a retired Sales executive for [employer]. 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  She lives in [place].  She the patient is alone today.  Allergies:  Allergies  Allergen Reactions  . Lisinopril     Raises BP    Current Medications: Current Outpatient Medications  Medication Sig Dispense Refill  . amLODipine (NORVASC) 10 MG tablet Take 5 mg by mouth every morning.     Marland Kitchen atorvastatin (LIPITOR) 80 MG tablet Take 80 mg by mouth daily.    . Biotin 10000 MCG TABS Take 10,000 mcg by mouth daily.    . cholecalciferol (VITAMIN D) 1000 UNITS tablet Take 1,000 Units by mouth every morning.     . diphenhydrAMINE (BENADRYL) 50 MG tablet Take 50 mg by mouth at bedtime as needed for sleep.    Mariane Baumgarten Sodium 100 MG capsule Take 3 tablets by mouth as needed.    Marland Kitchen HYDROcodone-acetaminophen (NORCO) 10-325 MG tablet Take 1 tablet by mouth every 6 (six) hours as needed.    . Ipratropium-Albuterol (COMBIVENT RESPIMAT) 20-100 MCG/ACT AERS respimat 1 puff every 6 (six) hours as needed.     Marland Kitchen losartan (COZAAR) 50 MG tablet Take 50 mg by mouth every morning.     . meloxicam (MOBIC) 7.5 MG tablet Take 7.5 mg by mouth 2 (two) times daily.     Marland Kitchen omeprazole (PRILOSEC) 40 MG capsule Take 1 capsule by mouth every morning.     . polyethylene glycol (MIRALAX /  GLYCOLAX) packet Take 17 g by mouth as needed.    . pregabalin (LYRICA) 50 MG capsule Take 100 mg by mouth 3 (three) times daily.     Marland Kitchen tiZANidine (ZANAFLEX) 4 MG tablet 3 (three) times daily.     . traZODone (DESYREL) 50 MG tablet Take 50 mg by mouth at bedtime. 1-2 tablets as needed for sleep.    Marland Kitchen venlafaxine XR (EFFEXOR XR) 37.5 MG 24 hr capsule Take 37.5 mg by mouth daily with breakfast.      No current facility-administered medications for this visit.     Review of Systems:  GENERAL:  Feels "fine".  Some fatigue.  No fevers, sweats or weight loss. PERFORMANCE STATUS (ECOG):  1 HEENT:   Blurry vision for years.  Seasonal allergies.  No runny nose, sore throat, mouth sores or tenderness. Lungs: Shortness of breath after smoking.  No cough.  No hemoptysis. Cardiac:  No chest pain, palpitations, orthopnea, or PND. GI:  Chronic constipation.  No nausea, vomiting, diarrhea, melena or hematochezia. GU:  No urgency, frequency, dysuria, or hematuria. Musculoskeletal:  Generalized aches and pains secondary to post-polio syndrome. Extremities:  No pain or swelling. Skin: Skin changes x 5-6 weeks with areas of pruritus (scalp and right flank- see HPI). Neuro:  Parkinson's.  No headache, numbness or weakness, balance or coordination issues. Endocrine:  No diabetes, thyroid issues, or night sweats. Hot flashes with tapering of hormone replacement therapy (HRT). Psych:  No mood changes, depression or anxiety. Pain:  No focal pain. Review of systems:  All other systems reviewed and found to be negative.  Physical Exam: Blood pressure 113/72, pulse 63, temperature (!) 97.1 F (36.2 C), temperature source Tympanic, resp. rate 18, height  5' (1.524 m), weight 116 lb 6.5 oz (52.8 kg), SpO2 98 %. GENERAL:  Well developed, well nourished, woman sitting comfortably in the exam room in no acute distress. MENTAL STATUS:  Alert and oriented to person, place and time. HEAD:  Shoulder length brown hair.   Normocephalic, atraumatic, face symmetric, no Cushingoid features. EYES:  Glasses.  Blue eyes.  Pupils equal round and reactive to light and accomodation.  No conjunctivitis or scleral icterus. ENT:  Oropharynx clear without lesion.  Tongue normal. Mucous membranes dry.  RESPIRATORY:  Clear to auscultation without rales, wheezes or rhonchi. CARDIOVASCULAR:  Regular rate and rhythm without murmur, rub or gallop. ABDOMEN:  Soft, non-tender, with active bowel sounds, and no hepatosplenomegaly.  No masses. SKIN:  Nail ridges.  No scalp lesions.  Small area of pinkness left parietal region.  Right flank with dry slightly pink skin.  No ulcers or lesions. EXTREMITIES:  Thin legs.  Left lower extremity brace extending to above the knee.  No edema, no skin discoloration or tenderness.  No palpable cords. LYMPH NODES: No palpable cervical, supraclavicular, axillary or inguinal adenopathy  NEUROLOGICAL: Unremarkable. PSYCH:  Appropriate.   Office Visit on 07/21/2018  Component Date Value Ref Range Status  . Sodium 07/21/2018 137  135 - 145 mmol/L Final  . Potassium 07/21/2018 4.1  3.5 - 5.1 mmol/L Final  . Chloride 07/21/2018 104  98 - 111 mmol/L Final  . CO2 07/21/2018 28  22 - 32 mmol/L Final  . Glucose, Bld 07/21/2018 84  70 - 99 mg/dL Final  . BUN 07/21/2018 14  8 - 23 mg/dL Final  . Creatinine, Ser 07/21/2018 0.57  0.44 - 1.00 mg/dL Final  . Calcium 07/21/2018 9.6  8.9 - 10.3 mg/dL Final  . Total Protein 07/21/2018 6.7  6.5 - 8.1 g/dL Final  . Albumin 07/21/2018 4.0  3.5 - 5.0 g/dL Final  . AST 07/21/2018 34  15 - 41 U/L Final  . ALT 07/21/2018 37  0 - 44 U/L Final  . Alkaline Phosphatase 07/21/2018 86  38 - 126 U/L Final  . Total Bilirubin 07/21/2018 0.6  0.3 - 1.2 mg/dL Final  . GFR calc non Af Amer 07/21/2018 >60  >60 mL/min Final  . GFR calc Af Amer 07/21/2018 >60  >60 mL/min Final  . Anion gap 07/21/2018 5  5 - 15 Final   Performed at [facility], [address] [place], [place] [address]  . WBC 07/21/2018 4.2  4.0 - 10.5 K/uL Final  . RBC 07/21/2018 4.52  3.87 - 5.11 MIL/uL Final  . Hemoglobin 07/21/2018 14.1  12.0 - 15.0 g/dL Final  . HCT 07/21/2018 42.1  36.0 - 46.0 % Final  . MCV 07/21/2018 93.1  80.0 - 100.0 fL Final  . MCH 07/21/2018 31.2  26.0 - 34.0 pg Final  . MCHC 07/21/2018 33.5  30.0 - 36.0 g/dL Final  . RDW 07/21/2018 14.0  11.5 - 15.5 % Final  . Platelets 07/21/2018 224  150 - 400 K/uL Final  . nRBC 07/21/2018 0.0  0.0 - 0.2 % Final  . Neutrophils Relative % 07/21/2018 52  % Final  . Neutro Abs 07/21/2018 2.2  1.7 - 7.7 K/uL Final  . Lymphocytes Relative 07/21/2018 35  % Final  . Lymphs Abs 07/21/2018 1.5  0.7 - 4.0 K/uL Final  . Monocytes Relative 07/21/2018 8  % Final  . Monocytes Absolute 07/21/2018 0.3  0.1 - 1.0 K/uL Final  . Eosinophils Relative 07/21/2018  4  % Final  . Eosinophils Absolute 07/21/2018 0.2  0.0 - 0.5 K/uL Final  . Basophils Relative 07/21/2018 1  % Final  . Basophils Absolute 07/21/2018 0.1  0.0 - 0.1 K/uL Final  . Immature Granulocytes 07/21/2018 0  % Final  . Abs Immature Granulocytes 07/21/2018 0.01  0.00 - 0.07 K/uL Final   Performed at [facility], [address], [place], [place] [address]    Assessment:  SHYLA GAYHEART is a 68 y.o. 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  She has no history of liver disease or kidney disease.    She has had recent abnormalities in her CBC.  ANC was 1500 on 06/08/2018.  Hemoglobin has been normal.  Peripheral smear revealed on 07/03/2018 revealed atypical lymphocytes, macrocytes, burr cells, anisocytosis, and poikilocytosis.  She has a history of B12 deficiency.  B12 was 204 on 02/22/2017 and 2358 on 12/26/2017.  She has been off B12 x 2 months.  She has a history of iron deficiency.  Ferritin was 101 on 06/28/2018.  She has been off oral iron since 07/2018.  She has a 50 pack year smoking history.  Low dose chest CT on 06/06/2018  revealed Lung-RADS 2.  She has a history of right breast DCIS in 2008 s/p lumpectomy followed by radiation.  DCIS was hormone receptor negative.  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  Symptomatically, she feels "fine", although a little more fatigued than unusal.  She notes hot flashes after coming off of HRT.  She denies any B symptoms.  Exam reveals no adenopathy or hepatosplenomegaly.  Plan: 1.   Labs today:  CBC with diff, CMP, B12, folate, TSH, retic, flow cytometry. 2.   Atypical lymphocytes  Etiology unclear.  Patient denies any recent infections.  Patient denies B symptoms, but notes menopausal symptoms.  Flow cytometry. 3.   Variable RBC morphology  Macrocytes (large cells) can be seen in B12 and folate deficiency, thyroid dysfunction, increased retic count.  Burr cells (echinocytes) can be seen in kidney and liver disease, hypothyroidism, anorexia.  Anisocytosis- variability in RBC size.  Poikilocytosis- variability in RBC shape.  Doubt significance as patient is not anemic.  Some abnormalities can be artifact.   Patient has been off B12 x 2 months.  Discuss work-up. 4.   RTC in 1 week for MD assessment and review of work-up.   Lequita Asal, MD  07/21/2018, 12:31 PM

## 2018-07-21 NOTE — Progress Notes (Signed)
Pt her as new patient. Referred Dr. Astrid Divine for abnormal labs. Denies any concerns.

## 2018-07-24 LAB — COMP PANEL: LEUKEMIA/LYMPHOMA

## 2018-07-27 ENCOUNTER — Ambulatory Visit: Payer: Medicare Other | Admitting: General Surgery

## 2018-07-28 ENCOUNTER — Other Ambulatory Visit: Payer: Self-pay

## 2018-07-29 DIAGNOSIS — E538 Deficiency of other specified B group vitamins: Secondary | ICD-10-CM | POA: Insufficient documentation

## 2018-07-29 NOTE — Progress Notes (Signed)
[facility]  [address], [address] [place], [place] [address] Phone: [phone]  Fax: [phone]   Telephone Office Visit:  07/31/2018   Referring physician:  Gayland Curry, MD   I connected with Andrea [name] on 07/31/18 at 1:02 PM EDT by telephone and verified that I was speaking with the correct person using 2 identifiers.  The patient was at home.  I discussed the limitations, risk, security and privacy concerns of performing an evaluation and management service by telephone and the availability of in person appointments.  I also discussed with the patient that there may be a patient responsible charge related to this service.  The patient expressed understanding and agreed to proceed.   Chief Complaint: Andrea [name] is a 68 y.o. female with an abnormal blood chemistry for review of work-up and discussion regarding direction of therapy.  HPI:  The patient was last seen in the medical oncology clinic on 07/21/2018 for initial consultation for atypical lymphocytes and variable RBC morphology.  Symptomatically, she felt "fine", although possibly a little more fatigued than unusal.  She noted hot flashes after coming off of HRT.  She denied any B symptoms.  She had a history of B12 deficiency and had been off her B12 x 2 months.  Exam revealed no adenopathy or hepatosplenomegaly.  She underwent a work-up.   CBC revealed a hematocrit of 42.1, hemoglobin 14.1, platelets 224,000, white count 4200 with an ANC of 2200.  Differential was unremarkable.  Retic was 1.1%.  B12 was 385.  Folate was 9.1.  TSH was 1.932 (normal).  CMP was normal.   Flow cytometry revealed no significant immunophenotypic abnormality.  There was no monoclonal B-cell population.  Kappa: Lambda ratio was 1.6.  There was no loss of, or aberrant expression of, the pan T-cell antigens to suggest a neoplastic T-cell process.  CD4: CD8 ratio was 2.1.  There were no circulating blasts.  There was no  immunophenotypic evidence of abnormal myeloid maturation.    Symptomatically, she denies any complaints.   Past Medical History:  Diagnosis Date  . Breast cancer ([place]) 2008   RT LUMPECTOMY  . Breast cancer ([place]) 2011   RT MASTECTOMY  . 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 Cervical post-laminectomy syndrome   . Chronic constipation   . Complication of anesthesia    itching real bad or msucles spasms  . Cubital tunnel syndrome   . Depression   . Disorders of sacrum   . Disturbance of skin sensation   . GERD (gastroesophageal reflux disease)   . History of colon polyps   . Hot flashes   . Hx of diplopia    right eye  . Hyperlipidemia   . Hypertension   . Insomnia   . Late effects of acute poliomyelitis   . Lateral epicondylitis  of elbow   . Loss of memory   . Menopausal state   . Osteoporosis   . Pain in joint of left shoulder   . Parkinson's disease ([place]) 2011  . Peripheral vascular disease ([place]) 2011   venous stasis   . Personal history of radiation therapy   . Radiation 2008   BREAST CA  . Tremor   . Unspecified musculoskeletal disorders and symptoms referable to neck    cervical/trapezius  . Vitamin D deficiency     Past Surgical History:  Procedure Laterality Date  . ABDOMINAL HYSTERECTOMY  1997  .  BREAST LUMPECTOMY Right 2008   4 cm area of DCIS, margins less than 1 mm. Treated with wide excision, whole breast radiation  . BREAST SURGERY Right December 04, 2009   Right simple mastectomy, sentinel node biopsy.  Marland Kitchen CARPAL TUNNEL RELEASE    . COLONOSCOPY  [id number]   Dr. Allen Norris, Dr. Vira Agar  . COLONOSCOPY    . COLONOSCOPY WITH PROPOFOL N/A 05/23/2017   Procedure: COLONOSCOPY WITH PROPOFOL;  Surgeon: Manya Silvas, MD;  Location[facility]COPY;  Service: Endoscopy;  Laterality: N/A;  . ELBOW SURGERY Left 2011  . ELBOW SURGERY Left 2015  . ESOPHAGOGASTRODUODENOSCOPY    . HIP SURGERY Right 06/19/12  . LEG  SURGERY Right 1958   Corrcetive surgery for polio  . LEG SURGERY Left 1958   corrective surgery for polio  . MASTECTOMY Right 2011   BREAST CA  . rectocele/enterocelle repair and perinoplasty    . SHOULDER ARTHROSCOPY W/ ROTATOR CUFF REPAIR Bilateral [id number]  . SHOULDER ARTHROSCOPY WITH OPEN ROTATOR CUFF REPAIR Left 08/19/2015   Procedure: SHOULDER ARTHROSCOPY WITH  MINI OPEN ROTATOR CUFF REPAIR,SUBACROMIAL DECOMPRESSION, ARTHROSCOPIC BICEPS TENODESIS;  Surgeon: Thornton Park, MD;  Location: [facility] ORS;  Service: Orthopedics;  Laterality: Left;  . SHOULDER SURGERY Left 2014  . SPINE SURGERY    . TOE FUSION Left 1970   little toe fusion  . TONSILLECTOMY      Family History  Problem Relation Age of Onset  . Cancer Mother   . Cancer Father   . Breast cancer Other 75    Social History:  reports that she has been smoking cigarettes. She has a 25.00 pack-year smoking history. She has never used smokeless tobacco. She reports that she does not drink alcohol or use drugs.   She smokes a half a pack a day at home but more when she is "out and about".  He is smoked for the past 50 years.  She denies any alcohol use.  She denies any exposure to radiation or toxins.  She is a retired Sales executive for [employer]. 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  She lives in [place].  Participants in the patient's visit included the patient and Vito Berger, CMA, today.  The intake visit was provided by Vito Berger, CMA.  Allergies:  Allergies  Allergen Reactions  . Lisinopril     Raises BP    Current Medications: Current Outpatient Medications  Medication Sig Dispense Refill  . amLODipine (NORVASC) 10 MG tablet Take 5 mg by mouth every morning.     Marland Kitchen atorvastatin (LIPITOR) 80 MG tablet Take 80 mg by mouth daily.    . Biotin 10000 MCG TABS Take 10,000 mcg by mouth daily.    . cholecalciferol (VITAMIN D) 1000 UNITS tablet Take  1,000 Units by mouth every morning.     . diphenhydrAMINE (BENADRYL) 50 MG tablet Take 50 mg by mouth at bedtime as needed for sleep.    Andrea [name] Sodium 100 MG capsule Take 3 tablets by mouth as needed.    Marland Kitchen HYDROcodone-acetaminophen (NORCO) 10-325 MG tablet Take 1 tablet by mouth every 6 (six) hours as needed.    . Ipratropium-Albuterol (COMBIVENT RESPIMAT) 20-100 MCG/ACT AERS respimat 1 puff every 6 (six) hours as needed.     Marland Kitchen losartan (COZAAR) 50 MG tablet Take 50 mg by mouth every morning.     . meloxicam (MOBIC) 7.5 MG tablet Take 7.5 mg by mouth  2 (two) times daily.     Marland Kitchen omeprazole (PRILOSEC) 40 MG capsule Take 1 capsule by mouth every morning.     . pregabalin (LYRICA) 50 MG capsule Take 100 mg by mouth 3 (three) times daily.     Marland Kitchen tiZANidine (ZANAFLEX) 4 MG tablet 3 (three) times daily.     . traZODone (DESYREL) 50 MG tablet Take 50 mg by mouth at bedtime. 1-2 tablets as needed for sleep.    Marland Kitchen venlafaxine XR (EFFEXOR XR) 37.5 MG 24 hr capsule Take 75 mg by mouth daily with breakfast.     . venlafaxine XR (EFFEXOR-XR) 75 MG 24 hr capsule Take by mouth.    . polyethylene glycol (MIRALAX / GLYCOLAX) packet Take 17 g by mouth as needed.     No current facility-administered medications for this visit.     Review of Systems:  GENERAL:  Feels "fine".  No changes since last visit.  No fevers, sweats or weight loss. PERFORMANCE STATUS (ECOG):  1 HEENT:  Seasonal allergies.  No visual changes, runny nose, sore throat, mouth sores or tenderness. Lungs:  Shortness of breath.  No cough.  No hemoptysis. Cardiac:  No chest pain, palpitations, orthopnea, or PND. GI:  Chronic constipation.  No nausea, vomiting, diarrhea, melena or hematochezia. GU:  No urgency, frequency, dysuria, or hematuria. Musculoskeletal:  Post polio syndrome.  Lower back pain. Extremities:  No pain or swelling. Skin:  No rashes or skin changes. Neuro:  Parkinson's.  No headache, numbness or weakness, balance or  coordination issues. Endocrine:  No diabetes, thyroid issues, or night sweats.  Hot flashes. Psych:  No mood changes, depression or anxiety. Pain:  Lower back ache (4 out of 10). Review of systems:  All other systems reviewed and found to be negative.   No visits with results within 3 Day(s) from this visit.  Latest known visit with results is:  Office Visit on 07/21/2018  Component Date Value Ref Range Status  . Retic Ct Pct 07/21/2018 1.1  0.4 - 3.1 % Final  . RBC. 07/21/2018 4.49  3.87 - 5.11 MIL/uL Final  . Retic Count, Absolute 07/21/2018 49.4  19.0 - 186.0 K/uL Final  . Immature Retic Fract 07/21/2018 4.1  2.3 - 15.9 % Final   Performed at [facility], [address] [place], [place] [address]  . TSH 07/21/2018 1.932  0.350 - 4.500 uIU/mL Final   Comment: Performed by a 3rd Generation assay with a functional sensitivity of <=0.01 uIU/mL. Performed at [facility], [address], [place], [place] [address]   . Folate 07/21/2018 9.1  >5.9 ng/mL Final   Performed at [facility], [address] [place], [place] [address]  . PATH INTERP XXX-IMP 07/21/2018 Comment   Final   No significant immunophenotypic abnormality detected, see comment  . ANNOTATION COMMENT IMP 07/21/2018 Comment   Corrected   Recommend clinical correlation and follow up as appropriate.  Marland Kitchen CLINICAL INFO 07/21/2018 Comment   Corrected   Comment: (NOTE) Accompanying CBC dated 07-21-18 shows: WBC count 4.2, Neu 2.2, Lym 1.5, Mon 0.3.   . Specimen Type 07/21/2018 Comment   Final   Peripheral blood  . ASSESSMENT OF LEUKOCYTES 07/21/2018 Comment   Final   Comment: (NOTE) No monoclonal B cell population is detected. kappa:lambda ratio 1.6 There is no loss of, or aberrant expression of, the pan T cell antigens to suggest a neoplastic T cell process. CD4:CD8 ratio 2.8 No circulating blasts are detected. There is no  immunophenotypic evidence of abnormal myeloid maturation.  Analysis of the leukocyte population shows: granulocytes 49%, monocytes 7%, lymphocytes 44%, blasts <0.1%, B cells 4%, T cells 36%, NK cells 4%.   . % Viable Cells 07/21/2018 Comment   Corrected   92%  . ANALYSIS AND GATING STRATEGY 07/21/2018 Comment   Final   8 color analysis with CD45/SSC  . IMMUNOPHENOTYPING STUDY 07/21/2018 Comment   Final   Comment: (NOTE) CD2       Normal         CD3       Normal CD4       Normal         CD5       Normal CD7       Normal         CD8       Normal CD10      Normal         CD11b     Normal CD13      Normal         CD14      Normal CD16      Normal         CD19      Normal CD20      Normal         CD33      Normal CD34      Normal         CD38      Normal CD45      Normal         CD56      Normal CD57      Normal         CD117     Normal HLA-DR    Normal         KAPPA     Normal LAMBDA    Normal         CD64      Normal   . PATHOLOGIST NAME 07/21/2018 Comment   Final   Lovett Sox, M.D.  . COMMENT: 07/21/2018 Comment   Corrected   Comment: (NOTE) Each antibody in this assay was utilized to assess for potential abnormalities of studied cell populations or to characterize identified abnormalities. This test was developed and its performance characteristics determined by [facility].  It has not been cleared or approved by the U.S. Food and Drug Administration. The FDA has determined that such clearance or approval is not necessary. This test is used for clinical purposes.  It should not be regarded as investigational or for research. Performed At: -[facility] [place], [place] [id number] Nechama Guard MD ZR:[id number] Performed At: [facility] [address] [place], [place] [id number] Nechama Guard MD LH:[id number]   . Sodium 07/21/2018 137  135 - 145 mmol/L Final  . Potassium 07/21/2018 4.1  3.5 - 5.1 mmol/L Final  . Chloride 07/21/2018 104  98 - 111 mmol/L Final  . CO2 07/21/2018 28  22 - 32  mmol/L Final  . Glucose, Bld 07/21/2018 84  70 - 99 mg/dL Final  . BUN 07/21/2018 14  8 - 23 mg/dL Final  . Creatinine, Ser 07/21/2018 0.57  0.44 - 1.00 mg/dL Final  . Calcium 07/21/2018 9.6  8.9 - 10.3 mg/dL Final  . Total Protein 07/21/2018 6.7  6.5 - 8.1 g/dL Final  . Albumin 07/21/2018 4.0  3.5 - 5.0 g/dL Final  . AST 07/21/2018 34  15 - 41  U/L Final  . ALT 07/21/2018 37  0 - 44 U/L Final  . Alkaline Phosphatase 07/21/2018 86  38 - 126 U/L Final  . Total Bilirubin 07/21/2018 0.6  0.3 - 1.2 mg/dL Final  . GFR calc non Af Amer 07/21/2018 >60  >60 mL/min Final  . GFR calc Af Amer 07/21/2018 >60  >60 mL/min Final  . Anion gap 07/21/2018 5  5 - 15 Final   Performed at [facility], [address], [place], [place] [address]  . WBC 07/21/2018 4.2  4.0 - 10.5 K/uL Final  . RBC 07/21/2018 4.52  3.87 - 5.11 MIL/uL Final  . Hemoglobin 07/21/2018 14.1  12.0 - 15.0 g/dL Final  . HCT 07/21/2018 42.1  36.0 - 46.0 % Final  . MCV 07/21/2018 93.1  80.0 - 100.0 fL Final  . MCH 07/21/2018 31.2  26.0 - 34.0 pg Final  . MCHC 07/21/2018 33.5  30.0 - 36.0 g/dL Final  . RDW 07/21/2018 14.0  11.5 - 15.5 % Final  . Platelets 07/21/2018 224  150 - 400 K/uL Final  . nRBC 07/21/2018 0.0  0.0 - 0.2 % Final  . Neutrophils Relative % 07/21/2018 52  % Final  . Neutro Abs 07/21/2018 2.2  1.7 - 7.7 K/uL Final  . Lymphocytes Relative 07/21/2018 35  % Final  . Lymphs Abs 07/21/2018 1.5  0.7 - 4.0 K/uL Final  . Monocytes Relative 07/21/2018 8  % Final  . Monocytes Absolute 07/21/2018 0.3  0.1 - 1.0 K/uL Final  . Eosinophils Relative 07/21/2018 4  % Final  . Eosinophils Absolute 07/21/2018 0.2  0.0 - 0.5 K/uL Final  . Basophils Relative 07/21/2018 1  % Final  . Basophils Absolute 07/21/2018 0.1  0.0 - 0.1 K/uL Final  . Immature Granulocytes 07/21/2018 0  % Final  . Abs Immature Granulocytes 07/21/2018 0.01  0.00 - 0.07 K/uL Final   Performed at [facility], [address]  [place], [place] [address]  . Vitamin B-12 07/21/2018 385  180 - 914 pg/mL Final   Comment: (NOTE) This assay is not validated for testing neonatal or myeloproliferative syndrome specimens for Vitamin B12 levels. Performed at [facility], [name], [place], [place] [address]     Assessment:  LADONNA VANORDER is a 68 y.o. 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  She has no history of liver disease or kidney disease.    She has had recent abnormalities in her CBC.  ANC was 1500 on 06/08/2018.  Hemoglobin has been normal.  Peripheral smear revealed on 07/03/2018 revealed atypical lymphocytes, macrocytes, burr cells, anisocytosis, and poikilocytosis.  Work-up on 07/21/2018 revealed a hematocrit of 42.1, hemoglobin 14.1, platelets 224,000, white count 4200 with an ANC of 2200.  Differential was unremarkable.  Retic was 1.1%.  B12 was 385.  Normal studies included: folate, TSH, and CMP.  Flow cytometry revealed no significant immunophenotypic abnormality.    She has a history of B12 deficiency.  B12 was 204 on 02/22/2017, 2358 on 12/26/2017, and 385 on 07/21/2018.  She was off B12 x 2 months.  She has a history of iron deficiency.  Ferritin was 101 on 06/28/2018.  She has been off oral iron since 07/2018.  She has a 50 pack year smoking history.  Low dose chest CT on 06/06/2018 revealed Lung-RADS 2.  She has a history of right breast DCIS in 2008 s/p lumpectomy followed by radiation.  DCIS was hormone receptor negative.  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  Symptomatically, she denies any B symptoms.  Plan: 1.   Review work-up.   CBC is normal.  CMP is normal.  Flow cytometry reveals no leukemia. 2.   History of B12 deficiency  B12 was 204 on 02/22/2017.  With supplementation, B12 increased to 2358.  Goal B12 >= 400.  She was off B12 x 2 months.  Suspect B12 drifting down has affected energy level.  Discuss restarting B12  supplementation at a lower dose.  Consider evaluation for pernicious anemia (anti-parietal antibodies and intrinsic factor antibodies). 3.   RTC prn.   I discussed the assessment and treatment plan with the patient.  The patient was provided an opportunity to ask questions and all were answered.  The patient agreed with the plan and demonstrated an understanding of the instructions.  The patient was advised to call back or seek an in person evaluation if the symptoms worsen or if the condition fails to improve as anticipated.  I provided 5 minutes (1:02 PM - 1:07 PM) of non-face-to-face time during this encounter.  I provided these services from the [facility] office.   Lequita Asal, MD, PhD  07/31/2018, 1:07 PM

## 2018-07-31 ENCOUNTER — Encounter: Payer: Self-pay | Admitting: Hematology and Oncology

## 2018-07-31 ENCOUNTER — Inpatient Hospital Stay (HOSPITAL_BASED_OUTPATIENT_CLINIC_OR_DEPARTMENT_OTHER): Payer: Medicare Other | Admitting: Hematology and Oncology

## 2018-07-31 ENCOUNTER — Other Ambulatory Visit: Payer: Self-pay

## 2018-07-31 DIAGNOSIS — E538 Deficiency of other specified B group vitamins: Secondary | ICD-10-CM | POA: Diagnosis not present

## 2018-07-31 NOTE — Progress Notes (Signed)
DOB and address was verified today with phone visit.

## 2018-08-03 ENCOUNTER — Encounter: Payer: Self-pay | Admitting: Family Medicine

## 2018-08-13 ENCOUNTER — Encounter: Payer: Self-pay | Admitting: Hematology and Oncology

## 2018-09-18 ENCOUNTER — Other Ambulatory Visit: Payer: Self-pay

## 2018-09-18 ENCOUNTER — Ambulatory Visit
Admission: RE | Admit: 2018-09-18 | Discharge: 2018-09-18 | Disposition: A | Discharge status: Home / Self Care | Payer: Medicare Other | Source: Ambulatory Visit | Attending: Family Medicine | Admitting: Family Medicine

## 2018-09-18 ENCOUNTER — Ambulatory Visit
Admission: RE | Admit: 2018-09-18 | Discharge: 2018-09-18 | Disposition: A | Discharge status: Home / Self Care | Payer: Medicare Other | Source: Ambulatory Visit | Attending: General Surgery | Admitting: General Surgery

## 2018-09-18 DIAGNOSIS — M816 Localized osteoporosis [Lequesne]: Secondary | ICD-10-CM | POA: Insufficient documentation

## 2018-09-18 DIAGNOSIS — M8580 Other specified disorders of bone density and structure, unspecified site: Secondary | ICD-10-CM | POA: Diagnosis present

## 2018-09-18 DIAGNOSIS — Z1231 Encounter for screening mammogram for malignant neoplasm of breast: Secondary | ICD-10-CM | POA: Diagnosis present

## 2018-09-18 DIAGNOSIS — M859 Disorder of bone density and structure, unspecified: Secondary | ICD-10-CM

## 2018-09-21 ENCOUNTER — Ambulatory Visit: Payer: Medicare Other | Admitting: General Surgery

## 2018-10-03 ENCOUNTER — Ambulatory Visit (INDEPENDENT_AMBULATORY_CARE_PROVIDER_SITE_OTHER): Payer: Medicare Other | Admitting: General Surgery

## 2018-10-03 ENCOUNTER — Encounter: Payer: Self-pay | Admitting: General Surgery

## 2018-10-03 ENCOUNTER — Other Ambulatory Visit: Payer: Self-pay

## 2018-10-03 VITALS — BP 145/82 | HR 80 | Temp 97.5°F | Resp 16 | Ht 60.0 in | Wt 118.2 lb

## 2018-10-03 DIAGNOSIS — D0511 Intraductal carcinoma in situ of right breast: Secondary | ICD-10-CM | POA: Diagnosis not present

## 2018-10-03 NOTE — Progress Notes (Signed)
Patient ID: Andrea [name], female   DOB: [birth year], 68 y.o.   MRN: [id number]  Chief Complaint  Patient presents with  . Follow-up    Left screening mammogram    HPI Andrea [name] is a 68 y.o. female.  Left screening mammogram. No complaints today. She admits to allergies with a light yellow productive cough.  The patient denies any shortness of breath.  She thought that she might like to discuss mastopexy on the left side, less likely to consider implant/reconstruction on the right.  She had met with Dr Dede Query at the time of her 2011 mastectomy, but had decided not to proceed with surgical intervention at that time.  HPI  Past Medical History:  Diagnosis Date  . Breast cancer ([place]) 2008   RT LUMPECTOMY  . Breast cancer ([place]) 2011   RT MASTECTOMY  . 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 Cervical post-laminectomy syndrome   . Chronic constipation   . Complication of anesthesia    itching real bad or msucles spasms  . Cubital tunnel syndrome   . Depression   . Disorders of sacrum   . Disturbance of skin sensation   . GERD (gastroesophageal reflux disease)   . History of colon polyps   . Hot flashes   . Hx of diplopia    right eye  . Hyperlipidemia   . Hypertension   . Insomnia   . Late effects of acute poliomyelitis   . Lateral epicondylitis  of elbow   . Loss of memory   . Menopausal state   . Osteoporosis   . Pain in joint of left shoulder   . Parkinson's disease ([employer]) 2011  . Peripheral vascular disease ([place]) 2011   venous stasis   . Personal history of radiation therapy   . Radiation 2008   BREAST CA  . Tremor   . Unspecified musculoskeletal disorders and symptoms referable to neck    cervical/trapezius  . Vitamin D deficiency     Past Surgical History:  Procedure Laterality Date  . ABDOMINAL HYSTERECTOMY  1997  . BREAST LUMPECTOMY Right 2008   4 cm area of DCIS, margins less than 1 mm. Treated with wide  excision, whole breast radiation  . BREAST SURGERY Right December 04, 2009   Right simple mastectomy, sentinel node biopsy.  Marland Kitchen CARPAL TUNNEL RELEASE    . COLONOSCOPY  [id number]   Dr. Allen Norris, Dr. Vira Agar  . COLONOSCOPY    . COLONOSCOPY WITH PROPOFOL N/A 05/23/2017   Procedure: COLONOSCOPY WITH PROPOFOL;  Surgeon: Manya Silvas, MD;  Location: [facility] ENDOSCOPY;  Service: Endoscopy;  Laterality: N/A;  . ELBOW SURGERY Left 2011  . ELBOW SURGERY Left 2015  . ESOPHAGOGASTRODUODENOSCOPY    . HIP SURGERY Right 06/19/12  . LEG SURGERY Right 1958   Corrcetive surgery for polio  . LEG SURGERY Left 1958   corrective surgery for polio  . MASTECTOMY Right 2011   BREAST CA  . rectocele/enterocelle repair and perinoplasty    . SHOULDER ARTHROSCOPY W/ ROTATOR CUFF REPAIR Bilateral [id number]  . SHOULDER ARTHROSCOPY WITH OPEN ROTATOR CUFF REPAIR Left 08/19/2015   Procedure: SHOULDER ARTHROSCOPY WITH  MINI OPEN ROTATOR CUFF REPAIR,SUBACROMIAL DECOMPRESSION, ARTHROSCOPIC BICEPS TENODESIS;  Surgeon: Thornton Park, MD;  Location: [facility] ORS;  Service: Orthopedics;  Laterality: Left;  . SHOULDER SURGERY Left 2014  . SPINE SURGERY    . TOE FUSION Left 1970   little toe fusion  .  TONSILLECTOMY      Family History  Problem Relation Age of Onset  . Cancer Mother   . Cancer Father   . Breast cancer Other 82    Social History Social History   Tobacco Use  . Smoking status: Current Every Day Smoker    Packs/day: 0.50    Years: 50.00    Pack years: 25.00    Types: Cigarettes    Last attempt to quit: 07/18/2017    Years since quitting: 1.2  . Smokeless tobacco: Never Used  . Tobacco comment: Patient states she is already working with PCP to quit  Substance Use Topics  . Alcohol use: No    Comment: No alcohol in the last 1.5 years  . Drug use: No    Allergies  Allergen Reactions  . Lisinopril     Raises BP    Current Outpatient Medications  Medication Sig Dispense Refill  . amLODipine  (NORVASC) 10 MG tablet Take 5 mg by mouth every morning.     Marland Kitchen atorvastatin (LIPITOR) 80 MG tablet Take 80 mg by mouth daily.    . Biotin 10000 MCG TABS Take 10,000 mcg by mouth daily.    . cholecalciferol (VITAMIN D) 1000 UNITS tablet Take 1,000 Units by mouth every morning.     . diphenhydrAMINE (BENADRYL) 50 MG tablet Take 50 mg by mouth at bedtime as needed for sleep.    Mariane Baumgarten Sodium 100 MG capsule Take 3 tablets by mouth as needed.    Marland Kitchen HYDROcodone-acetaminophen (NORCO) 10-325 MG tablet Take 1 tablet by mouth every 6 (six) hours as needed.    . Ipratropium-Albuterol (COMBIVENT RESPIMAT) 20-100 MCG/ACT AERS respimat 1 puff every 6 (six) hours as needed.     Marland Kitchen losartan (COZAAR) 50 MG tablet Take 50 mg by mouth every morning.     . meloxicam (MOBIC) 7.5 MG tablet Take 7.5 mg by mouth 2 (two) times daily.     Marland Kitchen omeprazole (PRILOSEC) 40 MG capsule Take 1 capsule by mouth every morning.     . polyethylene glycol (MIRALAX / GLYCOLAX) packet Take 17 g by mouth as needed.    . pregabalin (LYRICA) 50 MG capsule Take 100 mg by mouth 3 (three) times daily.     Marland Kitchen tiZANidine (ZANAFLEX) 4 MG tablet 3 (three) times daily.     . traZODone (DESYREL) 50 MG tablet Take 50 mg by mouth at bedtime. 1-2 tablets as needed for sleep.    Marland Kitchen venlafaxine XR (EFFEXOR-XR) 75 MG 24 hr capsule Take by mouth.     No current facility-administered medications for this visit.     Review of Systems Review of Systems  Constitutional: Negative.   Respiratory: Positive for cough.   Cardiovascular: Negative.     Blood pressure (!) 145/82, pulse 80, temperature (!) 97.5 F (36.4 C), temperature source Temporal, resp. rate 16, height 5' (1.524 m), weight 118 lb 3.2 oz (53.6 kg), SpO2 94 %.  Physical Exam Physical Exam Exam conducted with a chaperone present.  Constitutional:      Appearance: She is well-developed.  Eyes:     General: No scleral icterus.    Conjunctiva/sclera: Conjunctivae normal.  Neck:      Musculoskeletal: Normal range of motion and neck supple.  Cardiovascular:     Rate and Rhythm: Normal rate and regular rhythm.     Heart sounds: Normal heart sounds.  Pulmonary:     Effort: Pulmonary effort is normal.     Breath sounds: Normal  breath sounds.  Chest:     Breasts:        Left: No inverted nipple, mass, nipple discharge, skin change or tenderness.       Comments: Right mastectomy site Lymphadenopathy:     Cervical: No cervical adenopathy.  Skin:    General: Skin is warm and dry.  Neurological:     Mental Status: She is alert and oriented to person, place, and time.  Psychiatric:        Behavior: Behavior normal.     Data Reviewed Bone density dated Sep 19, 2018: Spine is unchanged with osteopenia.  Femur shows osteoporosis T score of -3.5. Hematology note of July 31, 2018 reviewed.  Assessment No evidence of recurrent cancer.  Essentially stable osteopenia/osteoporosis.  Plan The patient may benefit from aggressive measures to improve her bone density based on the slow progression over time from osteopenia to osteoporosis.  I suspect some of the issue is related to her previous polio and decreased muscle mass.  The patient will likely benefit from speaking to the plastic surgeon to see what her options are, even if she does not proceed to any surgical intervention.  She will let the office know if she wants to have an evaluation either in [place] or with [employer], DO in [place].  The patient is aware to call back for any questions or new concerns.  The patient was offered the opportunity to have her annual screening left breast mammogram and clinical breast exam with PCP.  As of this time she wants to return here at least 1 more year.  The patient has been asked to return to the office in one year with a unilateral left breast screening mammogram.  HPI, Physical Exam, Assessment and Plan have been scribed under the direction and in the presence of  Robert Bellow, MD. Jonnie Finner, CMA   HPI, assessment, plan and physical exam has been scribed under the direction and in the presence of Robert Bellow, MD. Karie Fetch, RN  I have completed the exam and reviewed the above documentation for accuracy and completeness.  I agree with the above.  Haematologist has been used and any errors in dictation or transcription are unintentional.  Hervey Ard, M.D., F.A.C.S.   Forest Gleason Byrnett 10/03/2018, 9:28 PM

## 2018-10-03 NOTE — Patient Instructions (Addendum)
The patient is aware to call back for any questions or new concerns. The patient has been asked to return to the office in one year with a unilateral left breast screening mammogram.

## 2018-10-11 ENCOUNTER — Encounter: Payer: Self-pay | Admitting: General Surgery

## 2018-10-13 ENCOUNTER — Ambulatory Visit
Admission: RE | Admit: 2018-10-13 | Discharge: 2018-10-13 | Disposition: A | Discharge status: Home / Self Care | Payer: Medicare Other | Attending: Family Medicine | Admitting: Family Medicine

## 2018-10-13 ENCOUNTER — Other Ambulatory Visit: Payer: Self-pay

## 2018-10-13 ENCOUNTER — Other Ambulatory Visit: Payer: Self-pay | Admitting: Family Medicine

## 2018-10-13 ENCOUNTER — Ambulatory Visit
Admission: RE | Admit: 2018-10-13 | Discharge: 2018-10-13 | Disposition: A | Discharge status: Home / Self Care | Payer: Medicare Other | Source: Ambulatory Visit | Attending: Family Medicine | Admitting: Family Medicine

## 2018-10-13 DIAGNOSIS — R937 Abnormal findings on diagnostic imaging of other parts of musculoskeletal system: Secondary | ICD-10-CM

## 2018-11-22 ENCOUNTER — Encounter: Payer: Self-pay | Admitting: Hematology and Oncology

## 2018-11-23 ENCOUNTER — Telehealth: Payer: Self-pay

## 2018-11-23 NOTE — Telephone Encounter (Signed)
Per patient request last office note and labs be faxed to [facility]. Notes have been faxed to [phone].

## 2018-11-24 ENCOUNTER — Other Ambulatory Visit: Payer: Self-pay | Admitting: Orthopedic Surgery

## 2018-11-24 DIAGNOSIS — M5412 Radiculopathy, cervical region: Secondary | ICD-10-CM

## 2018-11-24 DIAGNOSIS — M542 Cervicalgia: Secondary | ICD-10-CM

## 2018-12-01 ENCOUNTER — Encounter: Payer: Self-pay | Admitting: General Surgery

## 2018-12-06 ENCOUNTER — Ambulatory Visit
Admission: RE | Admit: 2018-12-06 | Discharge: 2018-12-06 | Disposition: A | Discharge status: Home / Self Care | Payer: Medicare Other | Source: Ambulatory Visit | Attending: Orthopedic Surgery | Admitting: Orthopedic Surgery

## 2018-12-06 ENCOUNTER — Other Ambulatory Visit: Payer: Self-pay

## 2018-12-06 DIAGNOSIS — M5412 Radiculopathy, cervical region: Secondary | ICD-10-CM | POA: Insufficient documentation

## 2018-12-06 DIAGNOSIS — M542 Cervicalgia: Secondary | ICD-10-CM

## 2019-01-25 ENCOUNTER — Other Ambulatory Visit: Payer: Self-pay | Admitting: Neurosurgery

## 2019-01-29 NOTE — Addendum Note (Signed)
Addended by: Earnie Larsson on: 01/29/2019 10:16 AM   Modules accepted: Orders

## 2019-01-30 ENCOUNTER — Other Ambulatory Visit: Payer: Self-pay

## 2019-01-30 ENCOUNTER — Other Ambulatory Visit (HOSPITAL_COMMUNITY)
Admission: RE | Admit: 2019-01-30 | Discharge: 2019-01-30 | Disposition: A | Discharge status: Home / Self Care | Payer: Medicare Other | Source: Ambulatory Visit | Attending: Neurosurgery | Admitting: Neurosurgery

## 2019-01-30 ENCOUNTER — Encounter (HOSPITAL_COMMUNITY)
Admission: RE | Admit: 2019-01-30 | Discharge: 2019-01-30 | Disposition: A | Discharge status: Home / Self Care | Payer: Medicare Other | Source: Ambulatory Visit | Attending: Neurosurgery | Admitting: Neurosurgery

## 2019-01-30 LAB — SARS CORONAVIRUS 2 (TAT 6-24 HRS): SARS Coronavirus 2: NEGATIVE

## 2019-01-30 LAB — SURGICAL PCR SCREEN
MRSA, PCR: NEGATIVE
Staphylococcus aureus: POSITIVE — AB

## 2019-01-30 LAB — BASIC METABOLIC PANEL
Anion gap: 8 (ref 5–15)
BUN: 13 mg/dL (ref 8–23)
CO2: 25 mmol/L (ref 22–32)
Calcium: 9.6 mg/dL (ref 8.9–10.3)
Chloride: 107 mmol/L (ref 98–111)
Creatinine, Ser: 0.56 mg/dL (ref 0.44–1.00)
GFR calc Af Amer: >60 mL/min (ref >60)
GFR calc non Af Amer: >60 mL/min (ref >60)
Glucose, Bld: 85 mg/dL (ref 70–99)
Potassium: 4.3 mmol/L (ref 3.5–5.1)
Sodium: 140 mmol/L (ref 135–145)

## 2019-01-30 LAB — CBC
HCT: 39.3 % (ref 36.0–46.0)
Hemoglobin: 13.2 g/dL (ref 12.0–15.0)
MCH: 31.6 pg (ref 26.0–34.0)
MCHC: 33.6 g/dL (ref 30.0–36.0)
MCV: 94 fL (ref 80.0–100.0)
Platelets: 293 10*3/uL (ref 150–400)
RBC: 4.18 MIL/uL (ref 3.87–5.11)
RDW: 14.1 % (ref 11.5–15.5)
WBC: 7.8 10*3/uL (ref 4.0–10.5)
nRBC: 0 % (ref 0.0–0.2)

## 2019-01-30 MED ORDER — CHLORHEXIDINE GLUCONATE CLOTH 2 % EX PADS: 6.0000 | MEDICATED_PAD | Freq: Once | CUTANEOUS | Status: DC

## 2019-01-30 NOTE — Pre-Procedure Instructions (Signed)
Andrea [name]  01/30/2019      [facility] [place], [place] - [address] [place] [address] Phone: [phone] Fax: [phone]  [facility] [id number], [place] - [address] AT [address] [place] [address] Phone: [phone] Fax: [phone]    Your procedure is scheduled on 02/01/19.  Report to [facility] Admitting at 6 A.M.  Call this number if you have problems the morning of surgery:  [phone]   Remember:  Do not eat or drink after midnight.  Y   Take these medicines the morning of surgery with A SIP OF WATER ---NORVASC,ALLEGRA,HYDROCODONE,PRILOSEC,EFFEXOR,LYRICA,ZANAFLEX    Do not wear jewelry, make-up or nail polish.  Do not wear lotions, powders, or perfumes, or deodorant.  Do not shave 48 hours prior to surgery.  Men may shave face and neck.  Do not bring valuables to the hospital.  [facility] is not responsible for any belongings or valuables.  Contacts, dentures or bridgework may not be worn into surgery.  Leave your suitcase in the car.  After surgery it may be brought to your room.  For patients admitted to the hospital, discharge time will be determined by your treatment team.  Patients discharged the day of surgery will not be allowed to drive home.   Special instructions:  ---Do not take any aspirin,anti-inflammatories,vitamins,or herbal supplements 5-7 days prior to surgery. [place] - Preparing for Surgery  Before surgery, you can play an important role.  Because skin is not sterile, your skin needs to be as free of germs as possible.  You can reduce the number of germs on you skin by washing with CHG (chlorahexidine gluconate) soap before surgery.  CHG is an antiseptic cleaner which kills germs and bonds with the skin to continue killing germs even after washing.  Oral Hygiene is also important in reducing the risk of infection.   Remember to brush your teeth with your regular toothpaste the morning of surgery.  Please DO NOT use if you have an allergy to CHG or antibacterial soaps.  If your skin becomes reddened/irritated stop using the CHG and inform your nurse when you arrive at Short Stay.  Do not shave (including legs and underarms) for at least 48 hours prior to the first CHG shower.  You may shave your face.  Please follow these instructions carefully:   1.  Shower with CHG Soap the night before surgery and the morning of Surgery.  2.  If you choose to wash your hair, wash your hair first as usual with your normal shampoo.  3.  After you shampoo, rinse your hair and body thoroughly to remove the shampoo. 4.  Use CHG as you would any other liquid soap.  You can apply chg directly to the skin and wash gently with a      scrungie or washcloth.           5.  Apply the CHG Soap to your body ONLY FROM THE NECK DOWN.   Do not use on open wounds or open sores. Avoid contact with your eyes, ears, mouth and genitals (private parts).  Wash genitals (private parts) with your normal soap.  6.  Wash thoroughly, paying special attention to the area where your surgery will be performed.  7.  Thoroughly rinse your body with warm water from the neck down.  8.  DO NOT shower/wash with your normal soap after using and rinsing off the CHG Soap.  9.  Pat yourself dry with a clean towel.            10.  Wear clean pajamas.            11.  Place clean sheets on your bed the night of your first shower and do not sleep with pets.  Day of Surgery  Do not apply any lotions/deoderants the morning of surgery.   Please wear clean clothes to the hospital/surgery center. Remember to brush your teeth with toothpaste.   Please read over the following fact sheets that you were given. MRSA Information

## 2019-01-30 NOTE — Progress Notes (Signed)
PCP - B.ALDRIDGE  [facility] Cardiologist - CLEARANCE RECEIVED FROM DR Gigi Gin   -    Chest x-ray - NA EKG - REQUESTED  Stress Test - 9/20 ECHO - NA        Aspirin Instructions:STOP     COVID TEST- TODAY  Anesthesia review: EKG AND CLEARANCE REQ. DR K  TODAY HEART HX Patient denies shortness of breath, fever, cough and chest pain at PAT appointment   Patient verbalized understanding of instructions that were given to them at the PAT appointment. Patient was also instructed that they will need to review over the PAT instructions again at home before surgery.

## 2019-01-31 NOTE — Anesthesia Preprocedure Evaluation (Addendum)
Anesthesia Evaluation  Patient identified by MRN, date of birth, ID band Patient awake    Reviewed: Allergy & Precautions, NPO status , Patient's Chart, lab work & pertinent test results  History of Anesthesia Complications Negative for: history of anesthetic complications  Airway Mallampati: I  TM Distance: >3 FB Neck ROM: Full    Dental  (+) Caps, Dental Advisory Given   Pulmonary Current SmokerPatient did not abstain from smoking.,  9/29/20202 SARS coronavirus NEG   breath sounds clear to auscultation       Cardiovascular hypertension, Pt. on medications (-) angina Rhythm:Regular Rate:Normal  01/02/2019 Stress: EF 65%, normal perfusion   Neuro/Psych Depression Parkinson's Chronic back pain: narcotics    GI/Hepatic GERD  Medicated and Controlled,(+)     substance abuse  ,   Endo/Other  negative endocrine ROS  Renal/GU negative Renal ROS     Musculoskeletal  (+) Arthritis , Osteoarthritis,  narcotic dependent  Abdominal   Peds  Hematology negative hematology ROS (+)   Anesthesia Other Findings H/o breast cancer  Reproductive/Obstetrics                           Anesthesia Physical Anesthesia Plan  ASA: III  Anesthesia Plan: General   Post-op Pain Management:    Induction: Intravenous  PONV Risk Score and Plan: 3 and Dexamethasone, Ondansetron and Scopolamine patch - Pre-op  Airway Management Planned: Oral ETT  Additional Equipment:   Intra-op Plan:   Post-operative Plan: Extubation in OR  Informed Consent: I have reviewed the patients History and Physical, chart, labs and discussed the procedure including the risks, benefits and alternatives for the proposed anesthesia with the patient or authorized representative who has indicated his/her understanding and acceptance.     Dental advisory given  Plan Discussed with: CRNA and Surgeon  Anesthesia Plan Comments:  (Follows with cardiology at [facility] for hx of atypical chest pain and risk factor modification. Last seen by Hilbert Odor, [place] 01/10/19 and cleared for surgery. Per note, "The patient is a 68 year old female with no known cardiac history, however, with several risk factors for cardiovascular disease including active smoking status, HTN, and HLD, who presents to clinic for cardiac clearances prior to having cervical disc replacement surgery. No scheduled date for this procedure set yet. The patient does report having right sided chest pain intermittently for years. This pain is atypical in nature for cardiac etiology but given her risk factors and change in EKG as compared to prior (new T wave flattening and ectopic rhythm noted) we obtained NM stress test for further evaluation which did not show any evidence of myocardial ischemia. She does have a prior ECHO on file showing normal heart structure and LV function. Holter monitor with occasional PVCs and PACs but no other significant rhythm disturbances. Medication regimen has been optimized and the patient is not experiencing any acute symptoms related to their pre-existing cardiac diagnoses. EKG performed in the office shows no significant changes from prior. The scheduled procedure is moderate risk, however, the patient is considered at low risk for cardiac complications form sedation, so she may proceed to their scheduled procedure as scheduled."  EKG 12/27/18: NSR. Rate 67. Nonspecific T wave abnormalities.   Nuclear stress 01/02/19 (care everywhere): FINDINGS: Regional wall motion: reveals normal myocardial thickening and wall  Motion. LVEF= 67% The overall quality of the study is good.  Artifacts noted: no Left ventricular cavity: normal  Impression: Normal Lexiscan infusion EKG  Normal myocardial perfusion without evidence of myocardial ischemia )    Anesthesia Quick Evaluation

## 2019-02-01 ENCOUNTER — Encounter (HOSPITAL_COMMUNITY)
Admission: RE | Disposition: A | Discharge status: Home / Self Care | Payer: Self-pay | Source: Home / Self Care | Attending: Neurosurgery

## 2019-02-01 ENCOUNTER — Inpatient Hospital Stay (HOSPITAL_COMMUNITY): Payer: Medicare Other | Admitting: Physician Assistant

## 2019-02-01 ENCOUNTER — Other Ambulatory Visit: Payer: Self-pay

## 2019-02-01 ENCOUNTER — Inpatient Hospital Stay (HOSPITAL_COMMUNITY): Payer: Medicare Other | Admitting: Anesthesiology

## 2019-02-01 ENCOUNTER — Inpatient Hospital Stay (HOSPITAL_COMMUNITY)
Admission: RE | Admit: 2019-02-01 | Discharge: 2019-02-01 | DRG: 473 | Disposition: A | Discharge status: Home / Self Care | Payer: Medicare Other | Attending: Neurosurgery | Admitting: Neurosurgery

## 2019-02-01 ENCOUNTER — Inpatient Hospital Stay (HOSPITAL_COMMUNITY): Payer: Medicare Other

## 2019-02-01 ENCOUNTER — Encounter (HOSPITAL_COMMUNITY): Payer: Self-pay | Admitting: Certified Registered"

## 2019-02-01 DIAGNOSIS — Z803 Family history of malignant neoplasm of breast: Secondary | ICD-10-CM

## 2019-02-01 DIAGNOSIS — M2578 Osteophyte, vertebrae: Secondary | ICD-10-CM | POA: Diagnosis present

## 2019-02-01 DIAGNOSIS — E785 Hyperlipidemia, unspecified: Secondary | ICD-10-CM | POA: Diagnosis present

## 2019-02-01 DIAGNOSIS — G20 Parkinson's disease: Secondary | ICD-10-CM | POA: Diagnosis present

## 2019-02-01 DIAGNOSIS — Z86010 Personal history of colonic polyps: Secondary | ICD-10-CM | POA: Diagnosis not present

## 2019-02-01 DIAGNOSIS — Z981 Arthrodesis status: Secondary | ICD-10-CM

## 2019-02-01 DIAGNOSIS — M4712 Other spondylosis with myelopathy, cervical region: Secondary | ICD-10-CM | POA: Diagnosis present

## 2019-02-01 DIAGNOSIS — Z923 Personal history of irradiation: Secondary | ICD-10-CM | POA: Diagnosis not present

## 2019-02-01 DIAGNOSIS — F17210 Nicotine dependence, cigarettes, uncomplicated: Secondary | ICD-10-CM | POA: Diagnosis present

## 2019-02-01 DIAGNOSIS — Z20828 Contact with and (suspected) exposure to other viral communicable diseases: Secondary | ICD-10-CM | POA: Diagnosis present

## 2019-02-01 DIAGNOSIS — F329 Major depressive disorder, single episode, unspecified: Secondary | ICD-10-CM | POA: Diagnosis present

## 2019-02-01 DIAGNOSIS — Z79891 Long term (current) use of opiate analgesic: Secondary | ICD-10-CM

## 2019-02-01 DIAGNOSIS — M4722 Other spondylosis with radiculopathy, cervical region: Secondary | ICD-10-CM | POA: Diagnosis present

## 2019-02-01 DIAGNOSIS — M1990 Unspecified osteoarthritis, unspecified site: Secondary | ICD-10-CM | POA: Diagnosis present

## 2019-02-01 DIAGNOSIS — I739 Peripheral vascular disease, unspecified: Secondary | ICD-10-CM | POA: Diagnosis present

## 2019-02-01 DIAGNOSIS — Z853 Personal history of malignant neoplasm of breast: Secondary | ICD-10-CM

## 2019-02-01 DIAGNOSIS — Z791 Long term (current) use of non-steroidal anti-inflammatories (NSAID): Secondary | ICD-10-CM | POA: Diagnosis not present

## 2019-02-01 DIAGNOSIS — Z9011 Acquired absence of right breast and nipple: Secondary | ICD-10-CM | POA: Diagnosis not present

## 2019-02-01 DIAGNOSIS — M4802 Spinal stenosis, cervical region: Secondary | ICD-10-CM | POA: Diagnosis present

## 2019-02-01 DIAGNOSIS — M4312 Spondylolisthesis, cervical region: Secondary | ICD-10-CM | POA: Diagnosis present

## 2019-02-01 DIAGNOSIS — Z90710 Acquired absence of both cervix and uterus: Secondary | ICD-10-CM

## 2019-02-01 DIAGNOSIS — I10 Essential (primary) hypertension: Secondary | ICD-10-CM | POA: Diagnosis present

## 2019-02-01 DIAGNOSIS — Z888 Allergy status to other drugs, medicaments and biological substances status: Secondary | ICD-10-CM | POA: Diagnosis not present

## 2019-02-01 DIAGNOSIS — M810 Age-related osteoporosis without current pathological fracture: Secondary | ICD-10-CM | POA: Diagnosis present

## 2019-02-01 DIAGNOSIS — Z79899 Other long term (current) drug therapy: Secondary | ICD-10-CM

## 2019-02-01 DIAGNOSIS — G8929 Other chronic pain: Secondary | ICD-10-CM | POA: Diagnosis present

## 2019-02-01 DIAGNOSIS — Z419 Encounter for procedure for purposes other than remedying health state, unspecified: Secondary | ICD-10-CM

## 2019-02-01 DIAGNOSIS — K219 Gastro-esophageal reflux disease without esophagitis: Secondary | ICD-10-CM | POA: Diagnosis present

## 2019-02-01 HISTORY — PX: ANTERIOR CERVICAL DECOMP/DISCECTOMY FUSION: SHX1161

## 2019-02-01 SURGERY — ANTERIOR CERVICAL DECOMPRESSION/DISCECTOMY FUSION 2 LEVELS
Anesthesia: General | Site: Neck

## 2019-02-01 MED ORDER — PANTOPRAZOLE SODIUM 40 MG PO TBEC: 40.0000 mg | DELAYED_RELEASE_TABLET | Freq: Every day | ORAL | Status: DC

## 2019-02-01 MED ORDER — MELOXICAM 7.5 MG PO TABS
7.5000 mg | ORAL_TABLET | Freq: Two times a day (BID) | ORAL | Status: DC
  Administered 2019-02-01: 7.5 mg via ORAL
  Filled 2019-02-01 (×2): qty 1

## 2019-02-01 MED ORDER — HYDROMORPHONE HCL 1 MG/ML IJ SOLN
INTRAMUSCULAR | Status: AC
  Filled 2019-02-01: qty 1

## 2019-02-01 MED ORDER — TRAZODONE HCL 50 MG PO TABS
50.0000 mg | ORAL_TABLET | Freq: Every evening | ORAL | Status: DC | PRN
  Filled 2019-02-01: qty 1

## 2019-02-01 MED ORDER — HYDROCODONE-ACETAMINOPHEN 5-325 MG PO TABS: 1.0000 | ORAL_TABLET | ORAL | Status: DC | PRN

## 2019-02-01 MED ORDER — SODIUM CHLORIDE 0.9% FLUSH
3.0000 mL | Freq: Two times a day (BID) | INTRAVENOUS | Status: DC
  Administered 2019-02-01: 16:00:00 3 mL via INTRAVENOUS

## 2019-02-01 MED ORDER — CYCLOBENZAPRINE HCL 10 MG PO TABS: 10.0000 mg | ORAL_TABLET | Freq: Three times a day (TID) | ORAL | Status: DC | PRN

## 2019-02-01 MED ORDER — CALCIUM CARBONATE-VITAMIN D 500-200 MG-UNIT PO TABS: 1.0000 | ORAL_TABLET | Freq: Every day | ORAL | Status: DC

## 2019-02-01 MED ORDER — ONDANSETRON HCL 4 MG/2ML IJ SOLN: 4.0000 mg | Freq: Four times a day (QID) | INTRAMUSCULAR | Status: DC | PRN

## 2019-02-01 MED ORDER — PHENOL 1.4 % MT LIQD
1.0000 | OROMUCOSAL | Status: DC | PRN
  Administered 2019-02-01: 1 via OROMUCOSAL
  Filled 2019-02-01: qty 177

## 2019-02-01 MED ORDER — CEFAZOLIN SODIUM-DEXTROSE 2-4 GM/100ML-% IV SOLN
2.0000 g | INTRAVENOUS | Status: AC
  Administered 2019-02-01: 2 g via INTRAVENOUS

## 2019-02-01 MED ORDER — PROPOFOL 10 MG/ML IV BOLUS
INTRAVENOUS | Status: AC
  Filled 2019-02-01: qty 20

## 2019-02-01 MED ORDER — PROMETHAZINE HCL 25 MG/ML IJ SOLN: 6.2500 mg | INTRAMUSCULAR | Status: DC | PRN

## 2019-02-01 MED ORDER — ONDANSETRON HCL 4 MG PO TABS: 4.0000 mg | ORAL_TABLET | Freq: Four times a day (QID) | ORAL | Status: DC | PRN

## 2019-02-01 MED ORDER — LOSARTAN POTASSIUM 50 MG PO TABS
50.0000 mg | ORAL_TABLET | Freq: Every day | ORAL | Status: DC
  Administered 2019-02-01: 50 mg via ORAL
  Filled 2019-02-01: qty 1

## 2019-02-01 MED ORDER — CHLORHEXIDINE GLUCONATE CLOTH 2 % EX PADS: 6.0000 | MEDICATED_PAD | Freq: Once | CUTANEOUS | Status: DC

## 2019-02-01 MED ORDER — SODIUM CHLORIDE 0.9% FLUSH: 3.0000 mL | INTRAVENOUS | Status: DC | PRN

## 2019-02-01 MED ORDER — VITAMIN B-12 1000 MCG PO TABS
1000.0000 ug | ORAL_TABLET | Freq: Every day | ORAL | Status: DC
  Filled 2019-02-01: qty 1

## 2019-02-01 MED ORDER — DEXAMETHASONE SODIUM PHOSPHATE 10 MG/ML IJ SOLN
10.0000 mg | Freq: Once | INTRAMUSCULAR | Status: AC
  Administered 2019-02-01: 10 mg via INTRAVENOUS

## 2019-02-01 MED ORDER — BIOTIN 10000 MCG PO TABS: 10000.0000 ug | ORAL_TABLET | Freq: Every day | ORAL | Status: DC

## 2019-02-01 MED ORDER — PREGABALIN 100 MG PO CAPS
100.0000 mg | ORAL_CAPSULE | Freq: Three times a day (TID) | ORAL | Status: DC
  Administered 2019-02-01: 100 mg via ORAL
  Filled 2019-02-01: qty 1

## 2019-02-01 MED ORDER — TRIAMCINOLONE ACETONIDE 55 MCG/ACT NA AERO: 2.0000 | INHALATION_SPRAY | Freq: Every day | NASAL | Status: DC | PRN

## 2019-02-01 MED ORDER — ONDANSETRON HCL 4 MG/2ML IJ SOLN
INTRAMUSCULAR | Status: DC | PRN
  Administered 2019-02-01: 4 mg via INTRAVENOUS

## 2019-02-01 MED ORDER — LORATADINE 10 MG PO TABS: 10.0000 mg | ORAL_TABLET | Freq: Every day | ORAL | Status: DC

## 2019-02-01 MED ORDER — THROMBIN 5000 UNITS EX SOLR
CUTANEOUS | Status: AC
  Filled 2019-02-01: qty 15000

## 2019-02-01 MED ORDER — SODIUM CHLORIDE 0.9 % IV SOLN
INTRAVENOUS | Status: DC | PRN
  Administered 2019-02-01: 10:00:00 60 ug/min via INTRAVENOUS

## 2019-02-01 MED ORDER — FAMOTIDINE 20 MG PO TABS
20.0000 mg | ORAL_TABLET | Freq: Two times a day (BID) | ORAL | Status: DC
  Administered 2019-02-01: 20 mg via ORAL
  Filled 2019-02-01: qty 1

## 2019-02-01 MED ORDER — ATORVASTATIN CALCIUM 80 MG PO TABS
80.0000 mg | ORAL_TABLET | Freq: Every day | ORAL | Status: DC
  Administered 2019-02-01: 80 mg via ORAL
  Filled 2019-02-01: qty 1

## 2019-02-01 MED ORDER — ACETAMINOPHEN 325 MG PO TABS: 650.0000 mg | ORAL_TABLET | ORAL | Status: DC | PRN

## 2019-02-01 MED ORDER — ACETAMINOPHEN 650 MG RE SUPP: 650.0000 mg | RECTAL | Status: DC | PRN

## 2019-02-01 MED ORDER — HYDROCODONE-ACETAMINOPHEN 10-325 MG PO TABS
1.0000 | ORAL_TABLET | ORAL | Status: DC | PRN
  Administered 2019-02-01: 2 via ORAL
  Administered 2019-02-01: 1 via ORAL
  Filled 2019-02-01 (×3): qty 1

## 2019-02-01 MED ORDER — LACTATED RINGERS IV SOLN
INTRAVENOUS | Status: DC | PRN
  Administered 2019-02-01 (×2): via INTRAVENOUS

## 2019-02-01 MED ORDER — SODIUM CHLORIDE 0.9 % IV SOLN: 250.0000 mL | INTRAVENOUS | Status: DC

## 2019-02-01 MED ORDER — LACTATED RINGERS IV SOLN
INTRAVENOUS | Status: DC
  Administered 2019-02-01: 07:00:00 via INTRAVENOUS

## 2019-02-01 MED ORDER — FENTANYL CITRATE (PF) 250 MCG/5ML IJ SOLN
INTRAMUSCULAR | Status: AC
  Filled 2019-02-01: qty 5

## 2019-02-01 MED ORDER — THROMBIN 5000 UNITS EX SOLR
OROMUCOSAL | Status: DC | PRN
  Administered 2019-02-01 (×2): 5 mL via TOPICAL

## 2019-02-01 MED ORDER — ROCURONIUM BROMIDE 10 MG/ML (PF) SYRINGE
PREFILLED_SYRINGE | INTRAVENOUS | Status: AC
  Filled 2019-02-01: qty 10

## 2019-02-01 MED ORDER — ROCURONIUM BROMIDE 50 MG/5ML IV SOSY
PREFILLED_SYRINGE | INTRAVENOUS | Status: DC | PRN
  Administered 2019-02-01: 50 mg via INTRAVENOUS

## 2019-02-01 MED ORDER — SUGAMMADEX SODIUM 200 MG/2ML IV SOLN
INTRAVENOUS | Status: DC | PRN
  Administered 2019-02-01: 125 mg via INTRAVENOUS

## 2019-02-01 MED ORDER — DIPHENHYDRAMINE HCL 25 MG PO CAPS
50.0000 mg | ORAL_CAPSULE | Freq: Every evening | ORAL | Status: DC | PRN
  Filled 2019-02-01: qty 2

## 2019-02-01 MED ORDER — PHENYLEPHRINE 40 MCG/ML (10ML) SYRINGE FOR IV PUSH (FOR BLOOD PRESSURE SUPPORT)
PREFILLED_SYRINGE | INTRAVENOUS | Status: AC
  Filled 2019-02-01: qty 10

## 2019-02-01 MED ORDER — SCOPOLAMINE 1 MG/3DAYS TD PT72
MEDICATED_PATCH | TRANSDERMAL | Status: AC
  Administered 2019-02-01: 1.5 mg via TRANSDERMAL
  Filled 2019-02-01: qty 1

## 2019-02-01 MED ORDER — SUCCINYLCHOLINE CHLORIDE 200 MG/10ML IV SOSY
PREFILLED_SYRINGE | INTRAVENOUS | Status: AC
  Filled 2019-02-01: qty 10

## 2019-02-01 MED ORDER — 0.9 % SODIUM CHLORIDE (POUR BTL) OPTIME
TOPICAL | Status: DC | PRN
  Administered 2019-02-01: 09:00:00 1000 mL

## 2019-02-01 MED ORDER — SODIUM CHLORIDE 0.9 % IV SOLN
INTRAVENOUS | Status: DC | PRN
  Administered 2019-02-01: 500 mL

## 2019-02-01 MED ORDER — POLYETHYLENE GLYCOL 3350 17 G PO PACK: 17.0000 g | PACK | Freq: Every day | ORAL | Status: DC | PRN

## 2019-02-01 MED ORDER — PHENYLEPHRINE 40 MCG/ML (10ML) SYRINGE FOR IV PUSH (FOR BLOOD PRESSURE SUPPORT)
PREFILLED_SYRINGE | INTRAVENOUS | Status: DC | PRN
  Administered 2019-02-01 (×3): 80 ug via INTRAVENOUS

## 2019-02-01 MED ORDER — ONDANSETRON HCL 4 MG/2ML IJ SOLN
INTRAMUSCULAR | Status: AC
  Filled 2019-02-01: qty 2

## 2019-02-01 MED ORDER — HYDROMORPHONE HCL 1 MG/ML IJ SOLN
0.2500 mg | INTRAMUSCULAR | Status: DC | PRN
  Administered 2019-02-01 (×3): 0.5 mg via INTRAVENOUS

## 2019-02-01 MED ORDER — MEPERIDINE HCL 25 MG/ML IJ SOLN: 6.2500 mg | INTRAMUSCULAR | Status: DC | PRN

## 2019-02-01 MED ORDER — HYDROMORPHONE HCL 1 MG/ML IJ SOLN: 1.0000 mg | INTRAMUSCULAR | Status: DC | PRN

## 2019-02-01 MED ORDER — LIDOCAINE 2% (20 MG/ML) 5 ML SYRINGE
INTRAMUSCULAR | Status: AC
  Filled 2019-02-01: qty 5

## 2019-02-01 MED ORDER — DEXAMETHASONE SODIUM PHOSPHATE 10 MG/ML IJ SOLN
INTRAMUSCULAR | Status: AC
  Filled 2019-02-01: qty 1

## 2019-02-01 MED ORDER — PROPOFOL 10 MG/ML IV BOLUS
INTRAVENOUS | Status: DC | PRN
  Administered 2019-02-01: 90 mg via INTRAVENOUS

## 2019-02-01 MED ORDER — TIZANIDINE HCL 4 MG PO TABS: 4.0000 mg | ORAL_TABLET | Freq: Four times a day (QID) | ORAL | Status: DC | PRN

## 2019-02-01 MED ORDER — SCOPOLAMINE 1 MG/3DAYS TD PT72
1.0000 | MEDICATED_PATCH | Freq: Once | TRANSDERMAL | Status: DC
  Administered 2019-02-01: 07:00:00 1.5 mg via TRANSDERMAL

## 2019-02-01 MED ORDER — MIDAZOLAM HCL 2 MG/2ML IJ SOLN
INTRAMUSCULAR | Status: AC
  Filled 2019-02-01: qty 2

## 2019-02-01 MED ORDER — LIDOCAINE 2% (20 MG/ML) 5 ML SYRINGE
INTRAMUSCULAR | Status: DC | PRN
  Administered 2019-02-01 (×2): 40 mg via INTRAVENOUS

## 2019-02-01 MED ORDER — VENLAFAXINE HCL ER 75 MG PO CP24: 150.0000 mg | ORAL_CAPSULE | Freq: Every day | ORAL | Status: DC

## 2019-02-01 MED ORDER — AMLODIPINE BESYLATE 5 MG PO TABS: 5.0000 mg | ORAL_TABLET | ORAL | Status: DC

## 2019-02-01 MED ORDER — CEFAZOLIN SODIUM-DEXTROSE 2-4 GM/100ML-% IV SOLN
INTRAVENOUS | Status: AC
  Filled 2019-02-01: qty 100

## 2019-02-01 MED ORDER — MIDAZOLAM HCL 2 MG/2ML IJ SOLN: 0.5000 mg | Freq: Once | INTRAMUSCULAR | Status: DC | PRN

## 2019-02-01 MED ORDER — FENTANYL CITRATE (PF) 100 MCG/2ML IJ SOLN
INTRAMUSCULAR | Status: DC | PRN
  Administered 2019-02-01 (×2): 50 ug via INTRAVENOUS
  Administered 2019-02-01: 100 ug via INTRAVENOUS
  Administered 2019-02-01: 50 ug via INTRAVENOUS
  Administered 2019-02-01: 100 ug via INTRAVENOUS
  Administered 2019-02-01: 50 ug via INTRAVENOUS

## 2019-02-01 MED ORDER — CEFAZOLIN SODIUM-DEXTROSE 1-4 GM/50ML-% IV SOLN
1.0000 g | Freq: Three times a day (TID) | INTRAVENOUS | Status: DC
  Administered 2019-02-01: 16:00:00 1 g via INTRAVENOUS
  Filled 2019-02-01: qty 50

## 2019-02-01 MED ORDER — MIDAZOLAM HCL 5 MG/5ML IJ SOLN
INTRAMUSCULAR | Status: DC | PRN
  Administered 2019-02-01: 2 mg via INTRAVENOUS

## 2019-02-01 MED ORDER — THROMBIN 5000 UNITS EX SOLR
CUTANEOUS | Status: AC
  Filled 2019-02-01: qty 5000

## 2019-02-01 MED ORDER — MENTHOL 3 MG MT LOZG
1.0000 | LOZENGE | OROMUCOSAL | Status: DC | PRN
  Filled 2019-02-01: qty 9

## 2019-02-01 SURGICAL SUPPLY — 64 items
APL SKNCLS STERI-STRIP NONHPOA (GAUZE/BANDAGES/DRESSINGS) ×1
BAG DECANTER FOR FLEXI CONT (MISCELLANEOUS) ×2 IMPLANT
BENZOIN TINCTURE PRP APPL 2/3 (GAUZE/BANDAGES/DRESSINGS) ×2 IMPLANT
BIT DRILL 13 (BIT) ×1 IMPLANT
BUR MATCHSTICK NEURO 3.0 LAGG (BURR) ×2 IMPLANT
CAGE PEEK 6X14X11 (Cage) ×2 IMPLANT
CAGE PEEK 7X14X11 (Cage) ×2 IMPLANT
CANISTER SUCT 3000ML PPV (MISCELLANEOUS) ×2 IMPLANT
CARTRIDGE OIL MAESTRO DRILL (MISCELLANEOUS) ×1 IMPLANT
COVER WAND RF STERILE (DRAPES) ×1 IMPLANT
DIFFUSER DRILL AIR PNEUMATIC (MISCELLANEOUS) ×2 IMPLANT
DRAPE C-ARM 42X72 X-RAY (DRAPES) ×4 IMPLANT
DRAPE LAPAROTOMY 100X72 PEDS (DRAPES) ×2 IMPLANT
DRAPE MICROSCOPE LEICA (MISCELLANEOUS) ×2 IMPLANT
DURAPREP 6ML APPLICATOR 50/CS (WOUND CARE) ×2 IMPLANT
ELECT COATED BLADE 2.86 ST (ELECTRODE) ×2 IMPLANT
ELECT REM PT RETURN 9FT ADLT (ELECTROSURGICAL) ×2
ELECTRODE REM PT RTRN 9FT ADLT (ELECTROSURGICAL) ×1 IMPLANT
GAUZE 4X4 16PLY RFD (DISPOSABLE) IMPLANT
GAUZE SPONGE 4X4 12PLY STRL (GAUZE/BANDAGES/DRESSINGS) ×2 IMPLANT
GLOVE BIO SURGEON STRL SZ 6.5 (GLOVE) ×1 IMPLANT
GLOVE BIOGEL PI IND STRL 6.5 (GLOVE) IMPLANT
GLOVE BIOGEL PI IND STRL 7.5 (GLOVE) IMPLANT
GLOVE BIOGEL PI INDICATOR 6.5 (GLOVE) ×3
GLOVE BIOGEL PI INDICATOR 7.5 (GLOVE) ×2
GLOVE ECLIPSE 7.0 STRL STRAW (GLOVE) ×1 IMPLANT
GLOVE ECLIPSE 9.0 STRL (GLOVE) ×2 IMPLANT
GLOVE EXAM NITRILE XL STR (GLOVE) IMPLANT
GLOVE SURG SS PI 6.0 STRL IVOR (GLOVE) ×4 IMPLANT
GOWN STRL REUS W/ TWL LRG LVL3 (GOWN DISPOSABLE) IMPLANT
GOWN STRL REUS W/ TWL XL LVL3 (GOWN DISPOSABLE) ×1 IMPLANT
GOWN STRL REUS W/TWL 2XL LVL3 (GOWN DISPOSABLE) IMPLANT
GOWN STRL REUS W/TWL LRG LVL3 (GOWN DISPOSABLE) ×8
GOWN STRL REUS W/TWL XL LVL3 (GOWN DISPOSABLE) ×2
HALTER HD/CHIN CERV TRACTION D (MISCELLANEOUS) ×2 IMPLANT
HEMOSTAT POWDER KIT SURGIFOAM (HEMOSTASIS) ×2 IMPLANT
HEMOSTAT SURGICEL 2X14 (HEMOSTASIS) IMPLANT
KIT BASIN OR (CUSTOM PROCEDURE TRAY) ×2 IMPLANT
KIT TURNOVER KIT B (KITS) ×2 IMPLANT
NDL SPNL 20GX3.5 QUINCKE YW (NEEDLE) ×1 IMPLANT
NEEDLE SPNL 20GX3.5 QUINCKE YW (NEEDLE) ×2 IMPLANT
NS IRRIG 1000ML POUR BTL (IV SOLUTION) ×2 IMPLANT
OIL CARTRIDGE MAESTRO DRILL (MISCELLANEOUS) ×2
PACK LAMINECTOMY NEURO (CUSTOM PROCEDURE TRAY) ×2 IMPLANT
PAD ARMBOARD 7.5X6 YLW CONV (MISCELLANEOUS) ×6 IMPLANT
PLATE ELITE 42MM (Plate) ×1 IMPLANT
RUBBERBAND STERILE (MISCELLANEOUS) ×4 IMPLANT
SCREW ST 15X4XST VA NS SPNE (Screw) IMPLANT
SCREW ST VAR 4 ATL (Screw) ×12 IMPLANT
SPACER SPNL 11X14X6XPEEK CVD (Cage) IMPLANT
SPACER SPNL 11X14X7XPEEK CVD (Cage) IMPLANT
SPCR SPNL 11X14X6XPEEK CVD (Cage) ×1 IMPLANT
SPCR SPNL 11X14X7XPEEK CVD (Cage) ×1 IMPLANT
SPONGE INTESTINAL PEANUT (DISPOSABLE) ×2 IMPLANT
SPONGE SURGIFOAM ABS GEL SZ50 (HEMOSTASIS) ×1 IMPLANT
STRIP CLOSURE SKIN 1/2X4 (GAUZE/BANDAGES/DRESSINGS) ×2 IMPLANT
SUT VIC AB 3-0 SH 8-18 (SUTURE) ×2 IMPLANT
SUT VIC AB 4-0 RB1 18 (SUTURE) ×2 IMPLANT
TAPE CLOTH 4X10 WHT NS (GAUZE/BANDAGES/DRESSINGS) ×1 IMPLANT
TAPE CLOTH SURG 4X10 WHT LF (GAUZE/BANDAGES/DRESSINGS) ×1 IMPLANT
TOWEL GREEN STERILE (TOWEL DISPOSABLE) ×2 IMPLANT
TOWEL GREEN STERILE FF (TOWEL DISPOSABLE) ×2 IMPLANT
TRAP SPECIMEN MUCOUS 40CC (MISCELLANEOUS) ×2 IMPLANT
WATER STERILE IRR 1000ML POUR (IV SOLUTION) ×2 IMPLANT

## 2019-02-01 NOTE — Progress Notes (Signed)
Orthopedic Tech Progress Note Patient Details:  Andrea [name] 1951-05-03 [id number] Applied while in PACU Ortho Devices Type of Ortho Device: Soft collar Ortho Device/Splint Location: neck Ortho Device/Splint Interventions: Application, Adjustment, Ordered   Post Interventions Patient Tolerated: Well Instructions Provided: Adjustment of device, Care of device   Janit Pagan 02/01/2019, 10:48 AM

## 2019-02-01 NOTE — H&P (Signed)
Andrea [name] is an 68 y.o. female.   Chief Complaint: Neck pain HPI: 68 year old female status post prior C5-6 anterior cervical decompression fusion presents with increasing neck pain with radiation to both shoulders.  Symptoms of failed conservative management her work-up demonstrates evidence of marked cervical spondylosis with severe facet arthropathy and moderately severe stenosis at C3-4 and C4-5.  Patient presents now for two-level anterior cervical decompression and fusion in hopes of improving her symptoms.  Past Medical History:  Diagnosis Date  . Breast cancer ([place]) 2008   RT LUMPECTOMY  . Breast cancer ([place]) 2011   RT MASTECTOMY  . 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 Cervical post-laminectomy syndrome   . Chronic constipation   . Complication of anesthesia    itching real bad or msucles spasms  . Cubital tunnel syndrome   . Depression   . Disorders of sacrum   . Disturbance of skin sensation   . GERD (gastroesophageal reflux disease)   . History of colon polyps   . Hot flashes   . Hx of diplopia    right eye  . Hyperlipidemia   . Hypertension   . Insomnia   . Late effects of acute poliomyelitis   . Lateral epicondylitis  of elbow   . Loss of memory   . Menopausal state   . Osteoporosis   . Pain in joint of left shoulder   . Parkinson's disease ([place]) 2011  . Peripheral vascular disease ([place]) 2011   venous stasis   . Personal history of radiation therapy   . Radiation 2008   BREAST CA  . Tremor   . Unspecified musculoskeletal disorders and symptoms referable to neck    cervical/trapezius  . Vitamin D deficiency     Past Surgical History:  Procedure Laterality Date  . ABDOMINAL HYSTERECTOMY  1997  . BREAST LUMPECTOMY Right 2008   4 cm area of DCIS, margins less than 1 mm. Treated with wide excision, whole breast radiation  . BREAST SURGERY Right December 04, 2009   Right simple mastectomy, sentinel node  biopsy.  Marland Kitchen CARPAL TUNNEL RELEASE    . COLONOSCOPY  SN:[id number]   Dr. Allen Norris, Dr. Vira Agar  . COLONOSCOPY    . COLONOSCOPY WITH PROPOFOL N/A 05/23/2017   Procedure: COLONOSCOPY WITH PROPOFOL;  Surgeon: Manya Silvas, MD;  Location: [facility] ENDOSCOPY;  Service: Endoscopy;  Laterality: N/A;  . ELBOW SURGERY Left 2011  . ELBOW SURGERY Left 2015  . ESOPHAGOGASTRODUODENOSCOPY    . HIP SURGERY Right 06/19/12  . LEG SURGERY Right 1958   Corrcetive surgery for polio  . LEG SURGERY Left 1958   corrective surgery for polio  . MASTECTOMY Right 2011   BREAST CA  . rectocele/enterocelle repair and perinoplasty    . SHOULDER ARTHROSCOPY W/ ROTATOR CUFF REPAIR Bilateral [id number]  . SHOULDER ARTHROSCOPY WITH OPEN ROTATOR CUFF REPAIR Left 08/19/2015   Procedure: SHOULDER ARTHROSCOPY WITH  MINI OPEN ROTATOR CUFF REPAIR,SUBACROMIAL DECOMPRESSION, ARTHROSCOPIC BICEPS TENODESIS;  Surgeon: Thornton Park, MD;  Location: [facility] ORS;  Service: Orthopedics;  Laterality: Left;  . SHOULDER SURGERY Left 2014  . SPINE SURGERY    . TOE FUSION Left 1970   little toe fusion  . TONSILLECTOMY      Family History  Problem Relation Age of Onset  . Cancer Mother   . Cancer Father   . Breast cancer Other 94   Social History:  reports that she has been  smoking cigarettes. She has a 25.00 pack-year smoking history. She has never used smokeless tobacco. She reports that she does not drink alcohol or use drugs.  Allergies:  Allergies  Allergen Reactions  . Lisinopril     Raises BP    Medications Prior to Admission  Medication Sig Dispense Refill  . amLODipine (NORVASC) 5 MG tablet Take 5 mg by mouth every morning.     Marland Kitchen atorvastatin (LIPITOR) 80 MG tablet Take 80 mg by mouth daily.    . Biotin 10000 MCG TABS Take 10,000 mcg by mouth daily.    . Calcium Carbonate-Vitamin D (CALCIUM 500 + D PO) Take 1 tablet by mouth daily.    . diphenhydrAMINE (BENADRYL) 50 MG tablet Take 50 mg by mouth at bedtime as needed for sleep.     . famotidine (PEPCID) 20 MG tablet Take 20 mg by mouth 2 (two) times daily.    . fexofenadine (ALLEGRA) 60 MG tablet Take 60 mg by mouth 4 (four) times daily as needed for allergies or rhinitis.    Marland Kitchen HYDROcodone-acetaminophen (NORCO) 10-325 MG tablet Take 1 tablet by mouth every 6 (six) hours as needed for moderate pain.     Marland Kitchen losartan (COZAAR) 50 MG tablet Take 50 mg by mouth every morning.     . meloxicam (MOBIC) 7.5 MG tablet Take 7.5 mg by mouth 2 (two) times daily.     Marland Kitchen omeprazole (PRILOSEC) 20 MG capsule Take 20 mg by mouth every morning.     . polyethylene glycol (MIRALAX / GLYCOLAX) packet Take 17 g by mouth daily as needed for moderate constipation.     . pregabalin (LYRICA) 100 MG capsule Take 100 mg by mouth 3 (three) times daily.     Marland Kitchen tiZANidine (ZANAFLEX) 4 MG tablet Take 4 mg by mouth 4 (four) times daily as needed for muscle spasms.     . traZODone (DESYREL) 50 MG tablet Take 50 mg by mouth at bedtime as needed for sleep.     Marland Kitchen triamcinolone (NASACORT ALLERGY 24HR) 55 MCG/ACT AERO nasal inhaler Place 2 sprays into the nose daily as needed (allergies).    . venlafaxine XR (EFFEXOR-XR) 150 MG 24 hr capsule Take 150 mg by mouth daily with breakfast.     . vitamin B-12 (CYANOCOBALAMIN) 1000 MCG tablet Take 1,000 mcg by mouth daily.      Results for orders placed or performed during the hospital encounter of 01/30/19 (from the past 48 hour(s))  Surgical pcr screen     Status: Abnormal   Collection Time: 01/30/19  3:12 PM   Specimen: Nasal Mucosa; Nasal Swab  Result Value Ref Range   MRSA, PCR NEGATIVE NEGATIVE   Staphylococcus aureus POSITIVE (A) NEGATIVE    Comment: (NOTE) The Xpert SA Assay (FDA approved for NASAL specimens in patients 34 years of age and older), is one component of a comprehensive surveillance program. It is not intended to diagnose infection nor to guide or monitor treatment. Performed at [facility], [place] [address] [place] [address]    CBC     Status: None   Collection Time: 01/30/19  3:20 PM  Result Value Ref Range   WBC 7.8 4.0 - 10.5 K/uL   RBC 4.18 3.87 - 5.11 MIL/uL   Hemoglobin 13.2 12.0 - 15.0 g/dL   HCT 39.3 36.0 - 46.0 %   MCV 94.0 80.0 - 100.0 fL   MCH 31.6 26.0 - 34.0 pg   MCHC 33.6 30.0 -  36.0 g/dL   RDW 14.1 11.5 - 15.5 %   Platelets 293 150 - 400 K/uL   nRBC 0.0 0.0 - 0.2 %    Comment: Performed at [facility], [place] [address], [place], [place] [id number]  Basic metabolic panel     Status: None   Collection Time: 01/30/19  3:20 PM  Result Value Ref Range   Sodium 140 135 - 145 mmol/L   Potassium 4.3 3.5 - 5.1 mmol/L   Chloride 107 98 - 111 mmol/L   CO2 25 22 - 32 mmol/L   Glucose, Bld 85 70 - 99 mg/dL   BUN 13 8 - 23 mg/dL   Creatinine, Ser 0.56 0.44 - 1.00 mg/dL   Calcium 9.6 8.9 - 10.3 mg/dL   GFR calc non Af Amer >60 >60 mL/min   GFR calc Af Amer >60 >60 mL/min   Anion gap 8 5 - 15    Comment: Performed at [facility] [address], [place], [place] [address]   No results found.  Pertinent items noted in HPI and remainder of comprehensive ROS otherwise negative.  Blood pressure 131/66, pulse 72, temperature 98.2 F (36.8 C), temperature source Oral, resp. rate 20, height 5' (1.524 m), weight 49.9 kg, SpO2 100 %.  Patient is awake and alert.  She is oriented and appropriate.  Speech is fluent.  Judgment insight are intact.  Cranial nerve function normal bilateral.  Motor examination extremities reveals intact motor strength in both upper and lower extremities.  Sensory examination nonfocal.  Deep tender versus normal active.  No evidence of long track signs.  Gait somewhat antalgic.  Posture normal.  Examination head ears eyes nose throat is unremarkable her chest and abdomen are benign.  Extremities are free from injury or deformity. Assessment/Plan C3-4, C4-5 spondylosis with foraminal and central stenosis.  Plan C3-4, C4-5 anterior cervical discectomy and fusion utilizing  interbody cage, locally harvested autograft, and anterior plate instrumentation.  Risks and benefits of been explained.  Patient wishes to proceed.  Mallie Mussel A Rakel Junio 02/01/2019, 7:46 AM

## 2019-02-01 NOTE — Progress Notes (Signed)
Discharged instructions/education/AVS/Rx given to patient and verbalized understanding. Swallowing well with no complaints, voice is clear. Pain is mild to moderate but tolerable per patient. Ambulating well with supervision, MAE. Voiding and emptying her bladder well. Awaiting for her transport.

## 2019-02-01 NOTE — Discharge Summary (Signed)
Physician Discharge Summary  Patient ID: Andrea [name] MRN: [id number] DOB/AGE: 02-Apr-1951 68 y.o.  Admit date: 02/01/2019 Discharge date: 02/01/2019  Admission Diagnoses:  Discharge Diagnoses:  Active Problems:   Cervical spondylosis with myelopathy and radiculopathy   Discharged Condition: good  Hospital Course: Patient admitted to the hospital where she underwent uncomplicated two-level anterior cervical decompression and fusion.  Postoperatively she is doing very well.  Preoperative neck and upper extremity symptoms are much improved.  Swallowing well.  Voice strong.  Ambulating without difficulty.  Voiding.  Ready for discharge home.  Consults:   Significant Diagnostic Studies:   Treatments:   Discharge Exam: Blood pressure (!) 112/59, pulse 68, temperature 98.5 F (36.9 C), temperature source Oral, resp. rate 18, height 5' (1.524 m), weight 49.9 kg, SpO2 95 %. Awake and alert.  Oriented and appropriate.  Cranial nerve function intact.  Motor and sensory function extremities stable.  Wound clean and dry.  Neck soft.  Chest and abdomen benign.  Disposition: Discharge disposition: 01-Home or Self Care        Allergies as of 02/01/2019      Reactions   Lisinopril    Raises BP      Medication List    TAKE these medications   amLODipine 5 MG tablet Commonly known as: NORVASC Take 5 mg by mouth every morning.   atorvastatin 80 MG tablet Commonly known as: LIPITOR Take 80 mg by mouth daily.   Biotin 10000 MCG Tabs Take 10,000 mcg by mouth daily.   CALCIUM 500 + D PO Take 1 tablet by mouth daily.   diphenhydrAMINE 50 MG tablet Commonly known as: BENADRYL Take 50 mg by mouth at bedtime as needed for sleep.   famotidine 20 MG tablet Commonly known as: PEPCID Take 20 mg by mouth 2 (two) times daily.   fexofenadine 60 MG tablet Commonly known as: ALLEGRA Take 60 mg by mouth 4 (four) times daily as needed for allergies or rhinitis.    HYDROcodone-acetaminophen 10-325 MG tablet Commonly known as: NORCO Take 1 tablet by mouth every 6 (six) hours as needed for moderate pain.   losartan 50 MG tablet Commonly known as: COZAAR Take 50 mg by mouth every morning.   meloxicam 7.5 MG tablet Commonly known as: MOBIC Take 7.5 mg by mouth 2 (two) times daily.   Nasacort Allergy 24HR 55 MCG/ACT Aero nasal inhaler Generic drug: triamcinolone Place 2 sprays into the nose daily as needed (allergies).   omeprazole 20 MG capsule Commonly known as: PRILOSEC Take 20 mg by mouth every morning.   polyethylene glycol 17 g packet Commonly known as: MIRALAX / GLYCOLAX Take 17 g by mouth daily as needed for moderate constipation.   pregabalin 100 MG capsule Commonly known as: LYRICA Take 100 mg by mouth 3 (three) times daily.   tiZANidine 4 MG tablet Commonly known as: ZANAFLEX Take 4 mg by mouth 4 (four) times daily as needed for muscle spasms.   traZODone 50 MG tablet Commonly known as: DESYREL Take 50 mg by mouth at bedtime as needed for sleep.   venlafaxine XR 150 MG 24 hr capsule Commonly known as: EFFEXOR-XR Take 150 mg by mouth daily with breakfast.   vitamin B-12 1000 MCG tablet Commonly known as: CYANOCOBALAMIN Take 1,000 mcg by mouth daily.        Signed: Cooper [name] [place] 02/01/2019, 6:57 PM

## 2019-02-01 NOTE — Discharge Instructions (Signed)

## 2019-02-01 NOTE — Brief Op Note (Signed)
02/01/2019  9:44 AM  PATIENT:  Andrea [name]  68 y.o. female  PRE-OPERATIVE DIAGNOSIS:  Spondylosis  POST-OPERATIVE DIAGNOSIS:  Spondylosis  PROCEDURE:  Procedure(s): Anterior Cervical Decompression Fusion Cervical three-four, Cervical four-five, hardware removal Cervical five-six (N/A)  SURGEON:  Surgeon(s) and Role:    * Earnie Larsson, MD - Primary  PHYSICIAN ASSISTANT:   ASSISTANTS: Bergman<NP   ANESTHESIA:   general  EBL:  50 mL   BLOOD ADMINISTERED:none  DRAINS: none   LOCAL MEDICATIONS USED:  NONE  SPECIMEN:  No Specimen  DISPOSITION OF SPECIMEN:  N/A  COUNTS:  YES  TOURNIQUET:  * No tourniquets in log *  DICTATION: .Dragon Dictation  PLAN OF CARE: Admit to inpatient   PATIENT DISPOSITION:  PACU - hemodynamically stable.   Delay start of Pharmacological VTE agent (>24hrs) due to surgical blood loss or risk of bleeding: yes

## 2019-02-01 NOTE — Op Note (Signed)
Date of procedure: 02/01/2019  Date of dictation: Same  Service: Neurosurgery  Preoperative diagnosis: C3-4, C4-5 spondylosis with stenosis and myelopathy and radiculopathy  Postoperative diagnosis: Same  Procedure Name: C3-4, C4-5 anterior cervical discectomy with interbody fusion utilizing interbody peek cages, local harvested autograft, and anterior plate instrumentation  Removal of C5-6 plate  Surgeon:Paulita Licklider A.Stela Iwasaki, M.D.  Asst. Surgeon: Reinaldo Meeker, NP  Anesthesia: General  Indication: 68 year old female status post a remote C5-6 anterior cervical discectomy and fusion.  Patient with worsening neck and bilateral proximal upper extremity symptoms.  Work-up demonstrates evidence of degenerative anterolisthesis of C3 on C4 with marked foraminal stenosis and severe facet arthropathy.  Patient also with severe foraminal stenosis and facet arthropathy at C4-5 with moderately severe central stenosis.  Patient presents now for two-level anterior cervical decompression and fusion in hopes of improving her symptoms.  Operative note: After induction of anesthesia, patient position supine with neck slightly extended and held in place with halter traction.  Patient's anterior cervical region prepped and draped sterilely.  Incision made overlying C4.  Dissection performed on the right.  Retractor placed.  Fluoroscopy used.  Levels confirmed.  Previously placed anterior cervical plate at 075-GRM was dissected free.  This was disassembled and removed.  Fusion at C5-6 was inspected and found to be solid.  Disc spaces at C4-5 and C3-4 were then incised with a 15 blade.  Discectomy was then performed using various instruments down to level the posterior annulus.  Microscope was then brought into the field and used throughout the remainder of the discectomies.  Remaining aspects annulus and osteophytes removed using high-speed drill down to the level of the posterior logical limb.  Posterior logical is not elevated and  resected in piecemeal fashion.  Underlying thecal sac was then identified.  A wide central decompression was then performed by undercutting the bodies of C3 and C4.  Decompression then proceeded into each neural foramen.  Wide anterior foraminotomies were performed on the course exiting C4 nerve roots.  At this point a very thorough decompression had been achieved.  There was no evidence of injury to the thecal sac or nerve roots.  The procedure was then repeated at C4-5 in a similar fashion and again without complication.  Wound was then irrigated with saline solution.  Gelfoam was placed topically for hemostasis then removed.  [employer] anatomic peek cages were packed with locally harvested autograft.  Each cage was then impacted into place and recessed slightly from the anterior cortical margin.  A 43 mm Atlantis anterior cervical plate was then placed over the C3, C4, C5 levels.  This then attached under fluoroscopic guidance using 13 mm variable angle screws.  All 6 screws given final tightening found to be solidly within the bone.  Locking screws engaged in all levels.  Final images reveal good position of the cages and the hardware at the proper operative level with normal alignment of the spine.  Wound is then irrigated one final time.  Hemostasis was assured.  Wounds and closed in layers with Vicryl sutures.  Steri-Strips and sterile dressing were applied.  No apparent complications.  Patient tolerated the procedure well and she returns to the recovery room postop.

## 2019-02-01 NOTE — Transfer of Care (Signed)
Immediate Anesthesia Transfer of Care Note  Patient: Andrea [name]  Procedure(s) Performed: Anterior Cervical Decompression Fusion Cervical three-four, Cervical four-five, hardware removal Cervical five-six (N/A Neck)  Patient Location: PACU  Anesthesia Type:General  Level of Consciousness: awake and patient cooperative  Airway & Oxygen Therapy: Patient Spontanous Breathing and Patient connected to nasal cannula oxygen  Post-op Assessment: Report given to RN and Post -op Vital signs reviewed and stable  Post vital signs: Reviewed and stable  Last Vitals:  Vitals Value Taken Time  BP 127/68 02/01/19 0955  Temp    Pulse 93 02/01/19 0956  Resp 13 02/01/19 0956  SpO2 100 % 02/01/19 0956  Vitals shown include unvalidated device data.  Last Pain:  Vitals:   02/01/19 [id number]  TempSrc: Oral         Complications: No apparent anesthesia complications

## 2019-02-01 NOTE — Anesthesia Procedure Notes (Addendum)
Procedure Name: Intubation Date/Time: 02/01/2019 8:07 AM Performed by: Orlie Dakin, CRNA Pre-anesthesia Checklist: Patient identified, Emergency Drugs available, Suction available and Patient being monitored Patient Re-evaluated:Patient Re-evaluated prior to induction Oxygen Delivery Method: Circle system utilized Preoxygenation: Pre-oxygenation with 100% oxygen Induction Type: IV induction Ventilation: Mask ventilation without difficulty Laryngoscope Size: Glidescope and 3 Grade View: Grade I Tube type: Oral Tube size: 7.0 mm Number of attempts: 1 Airway Equipment and Method: Video-laryngoscopy and Stylet Placement Confirmation: ETT inserted through vocal cords under direct vision,  breath sounds checked- equal and bilateral and positive ETCO2 Secured at: 24 cm Tube secured with: Tape Dental Injury: Teeth and Oropharynx as per pre-operative assessment  Comments: Patient positioned neck for comfort prior to induction, minimal extension with DL.  Glidescope used due to limited ROM of neck.  4x4s bite block used at end of case.

## 2019-02-01 NOTE — Anesthesia Postprocedure Evaluation (Signed)
Anesthesia Post Note  Patient: Andrea [name]  Procedure(s) Performed: Anterior Cervical Decompression Fusion Cervical three-four, Cervical four-five, hardware removal Cervical five-six (N/A Neck)     Patient location during evaluation: PACU Anesthesia Type: General Level of consciousness: awake and alert, patient cooperative and oriented Pain management: pain level controlled Vital Signs Assessment: post-procedure vital signs reviewed and stable Respiratory status: spontaneous breathing, nonlabored ventilation and respiratory function stable Cardiovascular status: blood pressure returned to baseline and stable Postop Assessment: no apparent nausea or vomiting Anesthetic complications: no    Last Vitals:  Vitals:   02/01/19 1135 02/01/19 1540  BP: 138/72 (!) 112/59  Pulse: 76 68  Resp: 19 18  Temp: 36.9 C 36.9 C  SpO2: 99% 95%    Last Pain:  Vitals:   02/01/19 1540  TempSrc: Oral  PainSc:                  Havard Radigan,E. Brylee Berk

## 2019-02-02 ENCOUNTER — Encounter (HOSPITAL_COMMUNITY): Payer: Self-pay | Admitting: Neurosurgery

## 2019-02-02 MED FILL — Thrombin For Soln 5000 Unit: CUTANEOUS | Qty: 5000 | Status: AC

## 2019-02-02 MED FILL — Gelatin Absorbable MT Powder: OROMUCOSAL | Qty: 1 | Status: AC

## 2019-02-21 DIAGNOSIS — J449 Chronic obstructive pulmonary disease, unspecified: Secondary | ICD-10-CM

## 2019-02-21 HISTORY — DX: Chronic obstructive pulmonary disease, unspecified: J44.9

## 2019-03-12 ENCOUNTER — Ambulatory Visit
Admission: RE | Admit: 2019-03-12 | Discharge: 2019-03-12 | Disposition: A | Discharge status: Home / Self Care | Payer: Medicare Other | Attending: Family Medicine | Admitting: Family Medicine

## 2019-03-12 ENCOUNTER — Ambulatory Visit
Admission: RE | Admit: 2019-03-12 | Discharge: 2019-03-12 | Disposition: A | Discharge status: Home / Self Care | Payer: Medicare Other | Source: Ambulatory Visit | Attending: Family Medicine | Admitting: Family Medicine

## 2019-03-12 ENCOUNTER — Other Ambulatory Visit: Payer: Self-pay

## 2019-03-12 ENCOUNTER — Other Ambulatory Visit: Payer: Self-pay | Admitting: Family Medicine

## 2019-03-12 DIAGNOSIS — R053 Chronic cough: Secondary | ICD-10-CM

## 2019-03-12 DIAGNOSIS — R05 Cough: Secondary | ICD-10-CM | POA: Diagnosis present

## 2019-03-16 ENCOUNTER — Other Ambulatory Visit: Payer: Self-pay | Admitting: Orthopedic Surgery

## 2019-03-16 DIAGNOSIS — M545 Low back pain, unspecified: Secondary | ICD-10-CM

## 2019-03-30 ENCOUNTER — Other Ambulatory Visit: Payer: Self-pay

## 2019-03-30 ENCOUNTER — Ambulatory Visit
Admission: RE | Admit: 2019-03-30 | Discharge: 2019-03-30 | Disposition: A | Discharge status: Home / Self Care | Payer: Medicare Other | Source: Ambulatory Visit | Attending: Orthopedic Surgery | Admitting: Orthopedic Surgery

## 2019-03-30 DIAGNOSIS — M545 Low back pain, unspecified: Secondary | ICD-10-CM

## 2019-04-04 DIAGNOSIS — M5412 Radiculopathy, cervical region: Secondary | ICD-10-CM | POA: Insufficient documentation

## 2019-06-06 ENCOUNTER — Telehealth: Payer: Self-pay

## 2019-06-06 DIAGNOSIS — Z87891 Personal history of nicotine dependence: Secondary | ICD-10-CM

## 2019-06-06 NOTE — Telephone Encounter (Signed)
Patient has been notified that lung cancer screening CT scan is due currently or will be in near future. Confirmed that patient is within the appropriate age range, and asymptomatic, (no signs or symptoms of lung cancer). Patient denies illness that would prevent curative treatment for lung cancer if found. Verified smoking history (quit 11.28.2020) Patient is agreeable for CT scan being scheduled any day mid morning.

## 2019-06-07 NOTE — Telephone Encounter (Signed)
Smoking history: former, quit 03/31/19 58.25 pack year

## 2019-06-07 NOTE — Addendum Note (Signed)
Addended by: Lieutenant Diego on: 06/07/2019 02:06 PM   Modules accepted: Orders

## 2019-06-22 ENCOUNTER — Ambulatory Visit
Admission: RE | Admit: 2019-06-22 | Discharge: 2019-06-22 | Disposition: A | Discharge status: Home / Self Care | Payer: Medicare Other | Source: Ambulatory Visit | Attending: Nurse Practitioner | Admitting: Nurse Practitioner

## 2019-06-22 ENCOUNTER — Other Ambulatory Visit: Payer: Self-pay

## 2019-06-22 DIAGNOSIS — Z87891 Personal history of nicotine dependence: Secondary | ICD-10-CM | POA: Diagnosis not present

## 2019-06-26 ENCOUNTER — Encounter: Payer: Self-pay | Admitting: *Deleted

## 2019-07-30 ENCOUNTER — Other Ambulatory Visit: Payer: Self-pay | Admitting: Family Medicine

## 2019-07-30 DIAGNOSIS — M810 Age-related osteoporosis without current pathological fracture: Secondary | ICD-10-CM

## 2019-08-16 ENCOUNTER — Other Ambulatory Visit: Payer: Self-pay | Admitting: General Surgery

## 2019-08-16 DIAGNOSIS — Z1239 Encounter for other screening for malignant neoplasm of breast: Secondary | ICD-10-CM

## 2019-08-16 DIAGNOSIS — Z1231 Encounter for screening mammogram for malignant neoplasm of breast: Secondary | ICD-10-CM

## 2019-08-16 DIAGNOSIS — Z853 Personal history of malignant neoplasm of breast: Secondary | ICD-10-CM

## 2019-09-19 ENCOUNTER — Ambulatory Visit
Admission: RE | Admit: 2019-09-19 | Discharge: 2019-09-19 | Disposition: A | Discharge status: Home / Self Care | Payer: Medicare Other | Source: Ambulatory Visit | Attending: Family Medicine | Admitting: Family Medicine

## 2019-09-19 ENCOUNTER — Ambulatory Visit
Admission: RE | Admit: 2019-09-19 | Discharge: 2019-09-19 | Disposition: A | Discharge status: Home / Self Care | Payer: Medicare Other | Source: Ambulatory Visit | Attending: General Surgery | Admitting: General Surgery

## 2019-09-19 DIAGNOSIS — M810 Age-related osteoporosis without current pathological fracture: Secondary | ICD-10-CM | POA: Diagnosis not present

## 2019-09-19 DIAGNOSIS — Z1239 Encounter for other screening for malignant neoplasm of breast: Secondary | ICD-10-CM | POA: Diagnosis present

## 2019-09-19 DIAGNOSIS — Z1231 Encounter for screening mammogram for malignant neoplasm of breast: Secondary | ICD-10-CM | POA: Diagnosis present

## 2019-10-09 DIAGNOSIS — Z993 Dependence on wheelchair: Secondary | ICD-10-CM | POA: Insufficient documentation

## 2019-11-19 ENCOUNTER — Ambulatory Visit: Payer: Medicare Other | Attending: *Deleted | Admitting: Physical Therapy

## 2019-11-19 ENCOUNTER — Other Ambulatory Visit: Payer: Self-pay

## 2019-11-19 DIAGNOSIS — R262 Difficulty in walking, not elsewhere classified: Secondary | ICD-10-CM | POA: Insufficient documentation

## 2019-11-19 NOTE — Therapy (Signed)
[place] [facility] [address] [place], [place], [address] Phone: [phone]   Fax:  [phone]  Physical Therapy Wheelchair Evaluation  Patient Details  Name: Andrea [name] MRN: [id number] Date of Birth: [birth year] No data recorded  Encounter Date: 11/19/2019   PT End of Session - 11/19/19 1046    Visit Number 1    Number of Visits 1    Date for PT Re-Evaluation 11/19/19    PT Start Time 0935    PT Stop Time 1040    PT Time Calculation (min) 65 min    Equipment Utilized During Treatment Gait belt    Activity Tolerance Patient tolerated treatment well;No increased pain    Behavior During Therapy WFL for tasks assessed/performed           Past Medical History:  Diagnosis Date  . Breast cancer ([place]) 2008   RT LUMPECTOMY  . Breast cancer ([place]) 2011   RT MASTECTOMY  . 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 Cervical post-laminectomy syndrome   . Chronic constipation   . Complication of anesthesia    itching real bad or msucles spasms  . Cubital tunnel syndrome   . Depression   . Disorders of sacrum   . Disturbance of skin sensation   . GERD (gastroesophageal reflux disease)   . History of colon polyps   . Hot flashes   . Hx of diplopia    right eye  . Hyperlipidemia   . Hypertension   . Insomnia   . Late effects of acute poliomyelitis   . Lateral epicondylitis  of elbow   . Loss of memory   . Menopausal state   . Osteoporosis   . Pain in joint of left shoulder   . Parkinson's disease ([place]) 2011  . Peripheral vascular disease ([place]) 2011   venous stasis   . Personal history of radiation therapy   . Radiation 2008   BREAST CA  . Tremor   . Unspecified musculoskeletal disorders and symptoms referable to neck    cervical/trapezius  . Vitamin D deficiency     Past Surgical History:  Procedure Laterality Date  . ABDOMINAL HYSTERECTOMY  1997  . ANTERIOR  CERVICAL DECOMP/DISCECTOMY FUSION N/A 02/01/2019   Procedure: Anterior Cervical Decompression Fusion Cervical three-four, Cervical four-five, hardware removal Cervical five-six;  Surgeon: Earnie Larsson, MD;  Location: [place];  Service: Neurosurgery;  Laterality: N/A;  . BREAST LUMPECTOMY Right 2008   4 cm area of DCIS, margins less than 1 mm. Treated with wide excision, whole breast radiation  . BREAST SURGERY Right December 04, 2009   Right simple mastectomy, sentinel node biopsy.  Marland Kitchen CARPAL TUNNEL RELEASE    . COLONOSCOPY  [id number]   Dr. Allen Norris, Dr. Vira Agar  . COLONOSCOPY    . COLONOSCOPY WITH PROPOFOL N/A 05/23/2017   Procedure: COLONOSCOPY WITH PROPOFOL;  Surgeon: Manya Silvas, MD;  Location: [facility] ENDOSCOPY;  Service: Endoscopy;  Laterality: N/A;  . ELBOW SURGERY Left 2011  . ELBOW SURGERY Left 2015  . ESOPHAGOGASTRODUODENOSCOPY    . HIP SURGERY Right 06/19/12  . LEG SURGERY Right 1958   Corrcetive surgery for polio  . LEG SURGERY Left 1958   corrective surgery for polio  . MASTECTOMY Right 2011   BREAST CA  . rectocele/enterocelle repair and perinoplasty    . SHOULDER ARTHROSCOPY W/ ROTATOR CUFF REPAIR Bilateral [id number]  . SHOULDER ARTHROSCOPY WITH OPEN  ROTATOR CUFF REPAIR Left 08/19/2015   Procedure: SHOULDER ARTHROSCOPY WITH  MINI OPEN ROTATOR CUFF REPAIR,SUBACROMIAL DECOMPRESSION, ARTHROSCOPIC BICEPS TENODESIS;  Surgeon: Thornton Park, MD;  Location: [facility];  Service: Orthopedics;  Laterality: Left;  . SHOULDER SURGERY Left 2014  . SPINE SURGERY    . TOE FUSION Left 1970   little toe fusion  . TONSILLECTOMY      There were no vitals filed for this visit.  PATIENT INFORMATION: This Evaluation form will serve as the LMN for the following suppliers:  Supplier: [employer] Contact Person: Deberah Pelton, ATP Phone:[phone]   Reason for Referral: New Power wheelchair Patient/caregiver Goals: to get new power wheelchair Patient was seen for face-to-face evaluation for new  power wheelchair.  Also present was  Deberah Pelton, ATP                   to discuss recommendations and wheelchair options.  Further paperwork was completed and sent to vendor.  Patient appears to qualify for power mobility device at this time per objective findings.   MEDICAL HISTORY:Post polio syndrome  Diagnosis:Post polio syndrome, PMH significant: COPD, Parkinson's Disease, Peripheral vascular disease, neuropathy (BLE, L>R), swelling in BLE,  Primary Diagnosis Onset:1957 [] Progressive Disease Relevant Past and Future Surgeries: C3-C6 anterior cervical fusion 2020, BUE rotator cuff repair 2010/2012, see above for additional surgeries;  Height:5 feet Weight: 115 pounds Explain and recent changes or trends in weight: none  Relevant History including falls: [place] a month ago, no injuries      HOME ENVIRONMENT: [x] House- mobile home  [] Condo/town home  [] Apartment  [] Assisted Living    [] Lives Alone [x]  Lives with spouse                                                  Hours with caregiver: 24/7  [x] Home is accessible to patient            Stairs  [] Yes []  No     Ramp [x] Yes [] No Comments:     COMMUNITY ADL: TRANSPORTATION: [] Car    [x] Van    [] Public Transportation    [] Adapted w/c Lift   [] Ambulance   [] Other:       [x] Sits in wheelchair during transport  Employment/School:   Retired;   Tax inspector pertaining to mobility                                                     Other:                                       FUNCTIONAL/SENSORY PROCESSING SKILLS:  Handedness:   [x] Right     [] Left    [] NA  Comments:                                 Counsellor for Wheeled Mobility [x] Processing Skills are adequate for safe wheelchair operation  Areas of concern than may interfere with safe operation of wheelchair Description of problem   []  Attention to environment     []   Judgment     []  Hearing  []  Vision or visual processing    [] Motor Planning   []  Fluctuations in Behavior                                                   VERBAL COMMUNICATION: [x] WFL receptive [x]  WFL expressive [] Understandable  [] Difficult to understand  [] non-communicative []  Uses an augmented communication device    CURRENT SEATING / MOBILITY: Current Mobility Base:   [] None  [] Dependent  [] Manual  [x] Scooter  [] Power   Type of Control:                       Manufacturer:    [employer]                     Size:                         Age:    May 2011           Current Condition of Mobility Base:  Broken left rear wheel,                                                                                                             Current Wheelchair components:  Has a hard time moving scooter with a few incidents of scooter almost tipping over;                                                                                                                                  Describe posture in present seating system:     WFL                                                                       SENSATION and SKIN ISSUES: Sensation [] Intact [x] Impaired [] Absent   Level of sensation:    Numbness and tingling in BLE feet; intact lower leg;  Pressure Relief: Able to perform effective pressure relief :   [x] Yes  []  No Method:       Transfers, uses quad cane;                                                                        If not, Why?:                                                                          Skin Issues/Skin Integrity Current Skin Issues   [x] Yes [] No  [] Intact [x]  Red area , along LLE where brace rubs, wears cotton barrier to help protect skin;  []  Open Area  [] Scar Tissue [] At risk from prolonged sitting  Where  LLE along knee where brace comes in contact;                             History of Skin Issues   [] Yes [x] No  Where                                         When                                                Hx of skin flap surgeries [] Yes [x] No  Where                  When                                                  Limited sitting tolerance [] Yes [x] No Hours spent sitting in wheelchair daily:  Several, able to transfer and move around as needed;                                                        Complaint of Pain:  Please describe:    3/10 low back pain currently; will worsen with prolonged sitting;  Swelling/Edema:        Has LLE KAFO, does get swelling in BLE (L>R) primarily in lower legs and ankles;  Worse with prolonged sitting in dependent position, relieved with elevation;                                                                                                                                          ADL STATUS (in reference to wheelchair use):  Indep Assist Unable Indep with Equip Not assessed Comments  Dressing     X                                                                    Eating       X                                                                                                                       Toileting     X                                                                                                                          Bathing    X  Grooming/ Hygiene   X                                                                                                                           Meal Prep  X                                                       does small amounts of cooking;                                                                  IADLS   X                                                                                                               Bowel Management: [x] Continent  [] Incontinent   [] Accidents Comments:                                                  Bladder Management: [x] Continent  [] Incontinent  [] Accidents Comments:          Occasional bladder leak                                    WHEELCHAIR SKILLS: Manual w/c Propulsion: [] UE or LE strength and endurance sufficient to participate in ADLs using manual wheelchair Arm :  [] left [] right  [] Both                                   Foot:   [] left [] right  [] Both  Distance:   Operate Scooter: []  Strength, hand grip, balance and transfer appropriate for use [] Living environment is accessible for use of scooter  Operate Power w/c:  [x]  Std. Joystick   []  Alternative Controls Indep [x]  Assist []  Dependent/ Unable []  N/A []  [x] Safe          []   Functional      Distance:                Bed confined without wheelchair []  Yes [x]  No   STRENGTH/RANGE OF MOTION:  Range of Motion Strength  Shoulder         WFL                                        Grossly 3+/5  Elbow WFL Grossly 4/5  Wrist/Hand WFL Grip strength: R: 50#, L: 45#  Hip WFL- slightly decreased LLE RLE: 4/5, L: 3-/5  Knee WFL on RLE, PROM is WFL on LLE RLE: 4/5, L: 0/5  Ankle WFL on RLE, PROM on LLE is WFL RLE: 4/5, L: 0/5      MOBILITY/BALANCE:  []  Patient is totally dependent for mobility                                                                                               Balance Transfers Ambulation  Sitting Balance: Standing Balance: [x]  Independent, with quad cane []  Independent/Modified Independent  [x]  WFL     []  WFL []  Supervision [x]  Supervision  []  Uses UE for balance  [x]  Supervision with quad cane []  Min Assist []  Ambulates with Assist                           []  Min Assist []  Min assist []  Mod Assist [x]  Ambulates with Device:  []  RW   []  StW   [x]  Quad Cane   []                 []  Mod Assist []  Mod assist []  Max assist   []  Max Assist []  Max assist []  Dependent [x]  Indep. Short Distance Only  []  Unable []  Unable []  Lift  / Sling Required Distance (in feet)             50-75 feet, 10 meter walk speed 0.43 m/s with quad cane (limited home ambulator, high fall risk);  Exhibits uneven step length with short step on RLE, decreased LLE hip/knee flexion during swing with increased circumduction for foot clearance, increased genu recuvatum on LLE during mid stance, wide base of support, short stance time on LLE; requires close supervision for safety;                 []  Sliding board []  Unable to Ambulate: (Explain:  Cardio Status:  [x] Intact  []  Impaired   []  NA                              Respiratory Status:  [] Intact   [x] Impaired   [] NA       Does have COPD, Spo2 following gait 100%, HR 65                              Orthotics/Prosthetics:  Wears LLE KAFO, current brace she has had for 3 years.                                                                       Comments (Address manual vs power w/c vs scooter): Patient exhibits functional strength in BUE however does have history of bilateral rotator cuff repair with history of chronic shoulder pain which does limit tolerance with manual wheelchair; Pt does have a scooter but has had some instability operating scooter with almost tipping over/falling during turns. Pt does have some tremors in BUE and head as a result of parkinson's disease which limits safety; She is able to safely operate a joystick for power chair device. Power chair would significant improve safety with mobility in the home. Pt's home is accessible for power chair.                                                          Anterior / Posterior Obliquity Rotation-Pelvis  PELVIS    [x] Neutral  []  Posterior  []  Anterior     [x] WFL  [] Right Elevated  [] Left Elevated   [x] WFL  [] Right Anterior []   Left Anterior    []  Fixed []  Partly Flexible [x]  Flexible  []  Other  []  Fixed  []  Partly Flexible  [x]  Flexible []  Other  []  Fixed  []  Partly Flexible  [x]  Flexible []  Other   TRUNK [x] WFL [] Thoracic Kyphosis [] Lumbar Lordosis   [x]  WFL [] Convex Right [] Convex Left   [] c-curve [] s-curve [] multiple  [x]  Neutral []  Left-anterior []  Right-anterior    []  Fixed []  Flexible []  Partly Flexible       Other  []  Fixed []  Flexible []  Partly Flexible []  Other  []  Fixed           []  Flexible []  Partly Flexible []  Other   Position Windswept   HIPS  [x]  Neutral []  Abduct []  ADduct [x]  Neutral []  Right []  Left       []  Fixed  []  Partly Flexible             []  Dislocated []  Flexible []  Subluxed    []  Fixed []  Partly Flexible  []  Flexible []  Other              Foot Positioning Knee Positioning   Knees and  Feet  [x]  WFL [] Left [] Right [x]  WFL [] Left [] Right   KNEES ROM concerns: ROM concerns:   & Dorsi-Flexed                    [] Lt [] Rt                                  FEET Plantar Flexed                  [] Lt [] Rt     Inversion                    [] Lt [] Rt  Eversion                    [] Lt [] Rt    HEAD [x]  Functional []  Good Head Control   & []  Flexed         []  Extended [x]  Adequate Head Control, does have tremors in head but has good control;    NECK []  Rotated  Lt  []  Lat Flexed Lt []  Rotated  Rt []  Lat Flexed Rt []  Limited Head Control    []  Cervical Hyperextension []  Absent  Head Control    SHOULDERS ELBOWS WRIST& HAND         Left     Right    Left     Right  U/E [x] Functional  Left            [x] Functional  Right       [facility]           WFL               [] Fisting             [] Fisting     [] elevated Left [] depressed  Left [] elevated Right [] depressed  Right      [] protracted Left [] retracted Left [] protracted Right [] retracted Right [] subluxed  Left              [] subluxed  Right         Goals for Wheelchair Mobility  [x]  Independence with mobility in the home with motor related ADLs (MRADLs)  [x]  Independence with MRADLs in the community []  Provide dependent mobility  []  Provide recline     [] Provide tilt    Goals for Seating system [x]  Optimize pressure distribution [x]  Provide support needed to facilitate function or safety []  Provide corrective forces to assist with maintaining or improving posture []  Accommodate client's posture: current seated postures and positions are not flexible or will not tolerate corrective forces []  Client to be independent with relieving pressure in the wheelchair [] Enhance physiological function such as breathing, swallowing, digestion                                                                               MOBILITY BASE RECOMMENDATIONS and JUSTIFICATION: MOBILITY COMPONENT JUSTIFICATION  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: [employer]           Size: Width 16          Seat Depth  18           [x] provide transport from point A to B [x] promote Indep mobility  [x] is not a safe, functional ambulator [] walker or cane inadequate [] non-standard width/depth necessary to accommodate anatomical measurement []                             [] Manual Mobility Base [] non-functional ambulator    [] Scooter/POV  [] can safely operate  [] can safely transfer   [] has adequate trunk stability  [] cannot functionally propel manual w/c  [x] Power Mobility Base, group 2 chair [x] non functional ambulator  [x] cannot functionally propel manual wheelchair  [x]  cannot functionally and safely operate scooter/POV [x] can safely operate and willing to  [] Stroller Base [] infant/child  []   unable to propel manual wheelchair [] allows for growth [] non-functional ambulator [] non-functional UE [] Indep mobility is not a goal at this time  [] Tilt  [] Forward                   [] Backward                  [] Powered tilt              [] Manual tilt  [] change position against gravitational force on head and shoulders  [] change position for pressure relief/cannot weight shift [] transfers  [] management of tone [] rest periods [] control edema [] facilitate postural control  []                                        [] Recline  [x] Manual recline on power base [] Manual recline on manual base  [] accommodate femur to back angle  [] bring to full recline for ADL care  [x] change position for safety when negotiating incline [x] rest periods [] repositioning for transfers or clothing/diaper /catheter changes [] head positioning  [] Lighter weight required [] self- propulsion  [] lifting []                                                 [] Heavy Duty required [] user weight greater than 250# [] extreme tone/ over active movement [] broken frame on previous chair []                                     [x]  Back  [x]  Angle Adjustable []  Custom molded                           [x] postural control [] control of tone/spasticity [] accommodation of range of motion [] UE functional control [] accommodation for seating system []                                          [] provide lateral trunk support [] accommodate deformity [] provide posterior trunk support [] provide lumbar/sacral support [x] support trunk in midline [] Pressure relief over spinal processes  []  Seat Cushion                       [] impaired sensation  [] decubitus ulcers present [] history of pressure ulceration [] prevent pelvic extension [] low maintenance  [] stabilize pelvis  [] accommodate obliquity [] accommodate multiple deformity [] neutralize lower extremity position [] increase pressure distribution []                                           []  Pelvic/thigh support  []  Lateral thigh guide []  Distal medial pad  []  Distal lateral pad []  pelvis in neutral [] accommodate pelvis []  position upper legs []  alignment []  accommodate ROM []  decrease adduction [] accommodate tone [] removable for transfers [] decrease abduction  []  Lateral trunk Supports []  Lt     []  Rt [] decrease lateral trunk leaning [] control tone [] contour for increased contact [] safety  [] accommodate asymmetry []                                                 [  x] Mounting  hardware  [x]  Cane holder to assist with transfers;  [] back   [] seat [] headrest      []  thigh support [] fixed   [] swing away [] attach seat platform/cushion to w/c frame [] attach back cushion to w/c frame [x] allow cane to travel for increased safety with transfers;  [] mount headrest  [] swing medial thigh support away [] swing lateral supports away for transfers  []                                                     Armrests  [] fixed [x] adjustable height [] removable   [] swing away  [x] flip back   [] reclining [] full length pads [] desk    [] pads tubular  [x] provide support with elbow at 90   [] provide support for w/c tray [x] change of height/angles for variable activities [x] remove for transfers [x] allow to come closer to table top [] remove for access to tables []                                               Hangers/ Leg rests  [] 60 [] 70 [] 90 [] elevating [] heavy duty  [] articulating [] fixed [] lift off [] swing away     [] power [] provide LE support  [] accommodate to hamstring tightness [] elevate legs during recline   [] provide change in position for Legs [] Maintain placement of feet on footplate [] durability [] enable transfers [] decrease edema [] Accommodate lower leg length []                                         Foot support Footplate    [] Lt  []  Rt  []  Center mount [] flip up                            [] depth/angle adjustable [] Amputee adapter    []  Lt     []  Rt [] provide foot support [] accommodate to ankle ROM [] transfers [] Provide support for residual extremity []  allow foot to go under wheelchair base []  decrease tone  []                                                 []  Ankle strap/heel loops [] support foot on foot support [] decrease extraneous movement [] provide input to heel  [] protect foot  Tires: [] pneumatic  [] flat free inserts  [] solid  [] decrease maintenance  [] prevent frequent flats [] increase shock absorbency [] decrease pain from road shock [] decrease  spasms from road shock []                                              []  Headrest  [] provide posterior head support [] provide posterior neck support [] provide lateral head support [] provide anterior head support [] support during tilt and recline [] improve feeding   [] improve respiration [] placement of switches [] safety  [] accommodate ROM  [] accommodate tone [] improve visual orientation  []  Anterior chest strap []  Vest []  Shoulder  retractors  [] decrease forward movement of shoulder [] accommodation of TLSO [] decrease forward movement of trunk [] decrease shoulder elevation [] added abdominal support [] alignment [] assistance with shoulder control  []                                               Pelvic Positioner [] Belt [] SubASIS bar [] Dual Pull [] stabilize tone [] decrease falling out of chair/ **will not Decrease potential for sliding due to pelvic tilting [] prevent excessive rotation [] pad for protection over boney prominence [] prominence comfort [] special pull angle to control rotation []                                                  Upper ExtremitySupport  [] L   []  R [] Arm trough   [] hand support []  tray       [] full tray [] swivel mount [] decrease edema      [] decrease subluxation   [] control tone   [] placement for AAC/Computer/EADL [] decrease gravitational pull on shoulders [] provide midline positioning [] provide support to increase UE function [] provide hand support in natural position [] provide work surface   POWER WHEELCHAIR CONTROLS  [x] Proportional  [] Non-Proportional Type                                      [] Left  [x] Right [x] provides access for controlling wheelchair   [] lacks motor control to operate proportional drive control [] unable to understand proportional controls  Actuator Control Module  [x] Single  [] Multiple   [x] Allow the client to operate the power seat function(s) through the joystick control   [] Safety Reset Switches [] Used to change  modes and stop the wheelchair when driving in latch mode    [] Upgraded Electronics   [] programming for accurate control [] progressive Disease/changing condition [] non-proportional drive control needed [] Needed in order to operate power seat functions through joystick control   [] Display box [] Allows user to see in which mode and drive the wheelchair is set  [] necessary for alternate controls    [] Digital interface electronics [] Allows w/c to operate when using alternative drive controls  [] ASL Head Array [] Allows client to operate wheelchair  through switches placed in tri-panel headrest  [] Sip and puff with tubing kit [] needed to operate sip and puff drive controls  [] Upgraded tracking electronics [] increase safety when driving [] correct tracking when on uneven surfaces  [x] Mount for switches or joystick [x] Attaches switches to w/c  [] Swing away for access or transfers [x] midline for optimal placement [x] provides for consistent access  [] Attendant controlled joystick plus mount [] safety [] long distance driving [] operation of seat functions [] compliance with transportation regulations []                                             Rear wheel placement/Axle adjustability [] None [] semi adjustable [] fully adjustable  [] improved UE access to wheels [] improved stability [] changing angle in space for improvement of postural stability [] 1-arm drive access [] amputee pad placement []   Wheel rims/ hand rims  [] metal   [] plastic coated [] oblique projections           [] vertical projections [] Provide ability to propel manual wheelchair  []  Increase self-propulsion with hand weakness/decreased grasp  Push handles [] extended   [] angle adjustable              [] standard [] caregiver access [] caregiver assist [] allows "hooking" to enable increased ability to perform ADLs or maintain balance  One armed device   [] Lt   [] Rt [] enable propulsion of manual wheelchair with  one arm   []                                            Brake/wheel lock extension []  Lt   []  Rt [] increase indep in applying wheel locks   [] Side guards [] prevent clothing getting caught in wheel or becoming soiled []  prevent skin tears/abrasions  Battery:                                            [x] to power wheelchair                                                         Other:                                                                                                                        The above equipment has a life- long use expectancy. Growth and changes in medical and/or functional conditions would be the exceptions. This is to certify that the therapist has no financial relationship with durable medical provider or manufacturer. The therapist will not receive remuneration of any kind for the equipment recommended in this evaluation.   Patient has mobility limitation that significantly impairs safe, timely participation in one or more mobility related ADL's. (bathing, toileting, feeding, dressing, grooming, moving from room to room)  [x]  Yes []  No  Will mobility device sufficiently improve ability to participate and/or be aided in participation of MRADL's?      [x]  Yes []  No  Can limitation be compensated for with use of a cane or walker?                                    []  Yes [x]  No  Does patient or caregiver demonstrate ability/potential ability & willingness to safely use the mobility device?    [x]  Yes []  No  Does patient's home environment support use of recommended mobility device?            [  x] Yes []  No  Does patient have sufficient upper extremity function necessary to functionally propel a manual wheelchair?     []  Yes [x]  No  Does patient have sufficient strength and trunk stability to safely operate a POV (scooter)?                                  []  Yes [x]  No  Does patient need additional features/benefits provided by a power wheelchair for MRADL's in the  home?        [x]  Yes []  No  Does the patient demonstrate the ability to safely use a power wheelchair?                   [x]  Yes []  No     Physician's Name Printed:   Severiano Gilbert MD                                                     68 Signature:  Date:     This is to certify that I, the above signed therapist have the following affiliations: []  This DME provider []  Manufacturer of recommended equipment []  Patient's long term care facility [x]  None of the above  Therapist Name/Signature:                                            Date:    Objective measurements completed on examination: See above findings.        PT Long Term Goals - 11/19/19 1113      PT LONG TERM GOAL #1   Title Pt and caregivers will understand PT recommendation and appropriate/safe use for wheelchair and seating for home use.    Time 1    Period Days    Status Achieved    Target Date 11/19/19             Plan - 11/19/19 1047    Clinical Impression Statement Patient presents to therapy for wheelchair evaluation. She has a PMH significant for post polio syndrome with LLE weakness as well as Parkinson's disease, spinal surgery, etc. She does appear to qualify for a power chair which would significant improve her safety in the home.    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 Cervical spondylosis with myelopathy and radiculopathy 02/01/2019  .  B12 deficiency 07/29/2018  . Personal history of tobacco use, presenting hazards to health 06/13/2016  . Personal history of malignant neoplasm of breast 07/26/2014  . 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  . Lumbosacral spondylosis without myelopathy 04/30/2013  . Subacute lumbar radiculopathy 04/30/2013  . Chronic low back pain 02/19/2013  . 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 Trochanteric bursitis of right hip 04/11/2012  . Sacroiliac joint dysfunction of both sides 07/12/2011    Cristin Penaflor PT, DPT 11/19/2019, 11:14 AM  [place] MAIN [facility] SERVICES [address] [place], [place], [address] Phone: [phone]   Fax:  [phone]  Name: COSTELLA SCHWARZ MRN: [id number] Date of Birth: 09/15/1950

## 2020-06-09 ENCOUNTER — Other Ambulatory Visit: Payer: Self-pay | Admitting: Orthopedic Surgery

## 2020-06-09 DIAGNOSIS — M25512 Pain in left shoulder: Secondary | ICD-10-CM

## 2020-06-12 ENCOUNTER — Ambulatory Visit: Payer: Medicare Other | Attending: Otolaryngology

## 2020-06-12 DIAGNOSIS — G4733 Obstructive sleep apnea (adult) (pediatric): Secondary | ICD-10-CM | POA: Insufficient documentation

## 2020-06-13 ENCOUNTER — Other Ambulatory Visit: Payer: Self-pay

## 2020-06-15 ENCOUNTER — Ambulatory Visit
Admission: RE | Admit: 2020-06-15 | Discharge: 2020-06-15 | Disposition: A | Discharge status: Home / Self Care | Payer: Medicare Other | Source: Ambulatory Visit | Attending: Orthopedic Surgery | Admitting: Orthopedic Surgery

## 2020-06-15 DIAGNOSIS — M25512 Pain in left shoulder: Secondary | ICD-10-CM | POA: Insufficient documentation

## 2020-06-20 ENCOUNTER — Other Ambulatory Visit: Payer: Self-pay | Admitting: *Deleted

## 2020-06-20 DIAGNOSIS — Z87891 Personal history of nicotine dependence: Secondary | ICD-10-CM

## 2020-06-20 DIAGNOSIS — Z122 Encounter for screening for malignant neoplasm of respiratory organs: Secondary | ICD-10-CM

## 2020-06-20 NOTE — Progress Notes (Signed)
Contacted and scheduled for annual lung screening scan. Patient is a former smoker, quit 03/31/19, 58.25 pack year history.

## 2020-07-02 ENCOUNTER — Other Ambulatory Visit: Payer: Self-pay

## 2020-07-02 ENCOUNTER — Ambulatory Visit
Admission: RE | Admit: 2020-07-02 | Discharge: 2020-07-02 | Disposition: A | Discharge status: Home / Self Care | Payer: Medicare Other | Source: Ambulatory Visit | Attending: Oncology | Admitting: Oncology

## 2020-07-02 DIAGNOSIS — Z122 Encounter for screening for malignant neoplasm of respiratory organs: Secondary | ICD-10-CM | POA: Insufficient documentation

## 2020-07-02 DIAGNOSIS — Z87891 Personal history of nicotine dependence: Secondary | ICD-10-CM | POA: Diagnosis not present

## 2020-07-03 ENCOUNTER — Other Ambulatory Visit: Payer: Self-pay | Admitting: General Surgery

## 2020-07-03 DIAGNOSIS — Z1231 Encounter for screening mammogram for malignant neoplasm of breast: Secondary | ICD-10-CM

## 2020-07-03 DIAGNOSIS — Z853 Personal history of malignant neoplasm of breast: Secondary | ICD-10-CM

## 2020-07-07 ENCOUNTER — Other Ambulatory Visit: Payer: Self-pay | Admitting: Otolaryngology

## 2020-07-07 DIAGNOSIS — R1310 Dysphagia, unspecified: Secondary | ICD-10-CM

## 2020-07-10 ENCOUNTER — Encounter: Payer: Self-pay | Admitting: *Deleted

## 2020-07-16 ENCOUNTER — Other Ambulatory Visit: Payer: Self-pay

## 2020-07-16 ENCOUNTER — Ambulatory Visit
Admission: RE | Admit: 2020-07-16 | Discharge: 2020-07-16 | Disposition: A | Discharge status: Home / Self Care | Payer: Medicare Other | Source: Ambulatory Visit | Attending: Otolaryngology | Admitting: Otolaryngology

## 2020-07-16 DIAGNOSIS — R1310 Dysphagia, unspecified: Secondary | ICD-10-CM

## 2020-07-17 NOTE — Therapy (Signed)
[place], [place], [address] Phone: [phone]   Fax:     Modified Barium Swallow  Patient Details  Name: Andrea [name] MRN: [id number] Date of Birth: 01-02-1951 No data recorded  Encounter Date: 07/16/2020   End of Session - 07/17/20 0851    Visit Number 1    Number of Visits 1    Date for SLP Re-Evaluation 07/16/20    SLP Start Time 1250    SLP Stop Time  0177    SLP Time Calculation (min) 25 min    Activity Tolerance Patient tolerated treatment well           Objective Swallowing Evaluation: Type of Study: MBS-Modified Barium Swallow Study   Patient Details  Name: Andrea [name] MRN: [id number] Date of Birth: 07/28/1950  Today's Date: 07/17/2020 Time: SLP Start Time (ACUTE ONLY): 1250 -SLP Stop Time (ACUTE ONLY): 1315  SLP Time Calculation (min) (ACUTE ONLY): 25 min   Past Medical History:  Past Medical History:  Diagnosis Date  . Breast cancer ([place]) 2008   RT LUMPECTOMY  . Breast cancer ([place]) 2011   RT MASTECTOMY  . 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 Cervical post-laminectomy syndrome   . Chronic constipation   . Complication of anesthesia    itching real bad or msucles spasms  . Cubital tunnel syndrome   . Depression   . Disorders of sacrum   . Disturbance of skin sensation   . GERD (gastroesophageal reflux disease)   . History of colon polyps   . Hot flashes   . Hx of diplopia    right eye  . Hyperlipidemia   . Hypertension   . Insomnia   . Late effects of acute poliomyelitis   . Lateral epicondylitis  of elbow   . Loss of memory   . Menopausal state   . Osteoporosis   . Pain in joint of left shoulder   . Parkinson's disease ([place]) 2011  . Peripheral vascular disease ([place]) 2011   venous stasis   . Personal history of radiation therapy   . Radiation 2008   BREAST CA  . Tremor   . Unspecified  musculoskeletal disorders and symptoms referable to neck    cervical/trapezius  . Vitamin D deficiency    Past Surgical History:  Past Surgical History:  Procedure Laterality Date  . ABDOMINAL HYSTERECTOMY  1997  . ANTERIOR CERVICAL DECOMP/DISCECTOMY FUSION N/A 02/01/2019   Procedure: Anterior Cervical Decompression Fusion Cervical three-four, Cervical four-five, hardware removal Cervical five-six;  Surgeon: Earnie Larsson, MD;  Location: [facility];  Service: Neurosurgery;  Laterality: N/A;  . BREAST LUMPECTOMY Right 2008   4 cm area of DCIS, margins less than 1 mm. Treated with wide excision, whole breast radiation  . BREAST SURGERY Right December 04, 2009   Right simple mastectomy, sentinel node biopsy.  Marland Kitchen CARPAL TUNNEL RELEASE    . COLONOSCOPY  [id number]   Dr. Allen Norris, Dr. Vira Agar  . COLONOSCOPY    . COLONOSCOPY WITH PROPOFOL N/A 05/23/2017   Procedure: COLONOSCOPY WITH PROPOFOL;  Surgeon: Manya Silvas, MD;  Location: [facility] ENDOSCOPY;  Service: Endoscopy;  Laterality: N/A;  . ELBOW SURGERY Left 2011  . ELBOW SURGERY Left 2015  . ESOPHAGOGASTRODUODENOSCOPY    . HIP SURGERY Right 06/19/12  . LEG SURGERY Right 1958   Corrcetive surgery for polio  . [place]  corrective surgery for polio  . MASTECTOMY Right 2011   BREAST CA  . rectocele/enterocelle repair and perinoplasty    . SHOULDER ARTHROSCOPY W/ ROTATOR CUFF REPAIR Bilateral [id number]  . SHOULDER ARTHROSCOPY WITH OPEN ROTATOR CUFF REPAIR Left 08/19/2015   Procedure: SHOULDER ARTHROSCOPY WITH  MINI OPEN ROTATOR CUFF REPAIR,SUBACROMIAL DECOMPRESSION, ARTHROSCOPIC BICEPS TENODESIS;  Surgeon: Thornton Park, MD;  Location: [facility] ORS;  Service: Orthopedics;  Laterality: Left;  . SHOULDER SURGERY Left 2014  . SPINE SURGERY    . TOE FUSION Left 1970   little toe fusion  . TONSILLECTOMY     HPI: Andrea [name] is a 70 y.o. female with past medical history noted for GERD, ACDF, post-polio syndrome, breast cancer, Parkinson's disease,  COPD referred by Dr. Pryor Ochoa for dysphagia to solids and liquids. Ms. Piscopo reports increasing dysphagia over the past 7 months. She reports having both MBS and barium swallow "many years ago." She does not remember the results. She complains of xerostomia and globus sensation at level of thyroid notch.   Subjective: "It comes back up"    Assessment / Plan / Recommendation  CHL IP CLINICAL IMPRESSIONS 07/16/2020  Clinical Impression Patient presents with mild structural pharyngeal dysphagia. Despite xerostomia, oral stage is within normal limits, with good oral control, rotary mastication, and timely anterior to posterior transit. Pharyngeal swallow initiation is timely at the level of the base of tongue. ACDF hardware present C3-5; the posterior pharyngeal wall around and just above this region protrudes into the pharyngeal space. This results in delayed and at times incomplete closure of the the epiglottis as it contacts the posterior pharyngeal wall. Despite this, pt had adequate airway protection, with no penetration or aspiration of any consistency. Barium pill became trapped in the valleculae, with immediate retrograde movement to return to oral cavity; she was able to swallow with second sip of thin liquid. Just below ACDF hardware and above the upper esophageal sphincter, there is a small pouching with statis/retention of solids >liquids, but this is transient. Suspect pt symptoms are more related to vallecular retention (she verbalized "this is what happens," when pill trapped in valleculae). However, MD may consider monitoring region of C5-7 for potential development of Zenker's diverticulum, as pt often exerts extra pressure to proprel boluses, particularly solids, through the upper pharynx. Advise she continue regular solids and thin liquids; alternate solids and liquids, chew foods well and moisten dry foods. May benefit from attempting pills whole in puree as heavier bolus may facilitate better  epiglottic deflection. No further skilled ST recommended at this time.  SLP Visit Diagnosis Dysphagia, pharyngeal phase (R13.13);Dysphagia, pharyngoesophageal phase (R13.14)  Attention and concentration deficit following --  Frontal lobe and executive function deficit following --  Impact on safety and function Mild aspiration risk      CHL IP TREATMENT RECOMMENDATION 07/16/2020  Treatment Recommendations No treatment recommended at this time     Prognosis 07/16/2020  Prognosis for Safe Diet Advancement Good  Barriers to Reach Goals --  Barriers/Prognosis Comment --    CHL IP DIET RECOMMENDATION 07/16/2020  SLP Diet Recommendations Regular solids;Thin liquid  Liquid Administration via Cup;Straw  Medication Administration Whole meds with puree  Compensations Slow rate;Small sips/bites;Follow solids with liquid;Multiple dry swallows after each bite/sip  Postural Changes Seated upright at 90 degrees      CHL IP OTHER RECOMMENDATIONS 07/16/2020  Recommended Consults --  Oral Care Recommendations Oral care BID  Other Recommendations --      CHL IP FOLLOW UP RECOMMENDATIONS 07/16/2020  Follow up Recommendations None      No flowsheet data found.         CHL IP ORAL PHASE 07/16/2020  Oral Phase WFL  Oral - Pudding Teaspoon --  Oral - Pudding Cup --  Oral - Honey Teaspoon --  Oral - Honey Cup --  Oral - Nectar Teaspoon --  Oral - Nectar Cup --  Oral - Nectar Straw --  Oral - Thin Teaspoon --  Oral - Thin Cup --  Oral - Thin Straw --  Oral - Puree --  Oral - Mech Soft --  Oral - Regular --  Oral - Multi-Consistency --  Oral - Pill --  Oral Phase - Comment --    CHL IP PHARYNGEAL PHASE 07/16/2020  Pharyngeal Phase Impaired  Pharyngeal- Pudding Teaspoon --  Pharyngeal --  Pharyngeal- Pudding Cup --  Pharyngeal --  Pharyngeal- Honey Teaspoon --  Pharyngeal --  Pharyngeal- Honey Cup --  Pharyngeal --  Pharyngeal- Nectar Teaspoon --  Pharyngeal --  Pharyngeal- Nectar  Cup Reduced epiglottic inversion;Pharyngeal residue - posterior pharnyx  Pharyngeal Material does not enter airway  Pharyngeal- Nectar Straw --  Pharyngeal --  Pharyngeal- Thin Teaspoon --  Pharyngeal --  Pharyngeal- Thin Cup Reduced epiglottic inversion;Pharyngeal residue - posterior pharnyx  Pharyngeal Material does not enter airway  Pharyngeal- Thin Straw --  Pharyngeal --  Pharyngeal- Puree Reduced epiglottic inversion;Pharyngeal residue - posterior pharnyx  Pharyngeal Material does not enter airway  Pharyngeal- Mechanical Soft Reduced epiglottic inversion;Pharyngeal residue - posterior pharnyx  Pharyngeal Material does not enter airway  Pharyngeal- Regular --  Pharyngeal --  Pharyngeal- Multi-consistency --  Pharyngeal --  Pharyngeal- Pill Reduced epiglottic inversion;Pharyngeal residue - valleculae  Pharyngeal Material does not enter airway  Pharyngeal Comment --       There were no vitals filed for this visit.     Dysphagia, unspecified type - Plan: DG SWALLOW FUNC OP MEDICARE SPEECH PATH, DG SWALLOW FUNC OP MEDICARE SPEECH PATH    Problem List Patient Active Problem List   Diagnosis Date Noted  . Cervical spondylosis with myelopathy and radiculopathy 02/01/2019  . B12 deficiency 07/29/2018  . Personal history of tobacco use, presenting hazards to health 06/13/2016  . Personal history of malignant neoplasm of breast 07/26/2014  . 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  . Lumbosacral spondylosis without myelopathy 04/30/2013  . Subacute lumbar radiculopathy 04/30/2013  . Chronic low back pain 02/19/2013  . 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 Trochanteric bursitis of right hip 04/11/2012  . Sacroiliac joint dysfunction of both sides 07/12/2011   Deneise Lever, [place], [place] 07/17/2020, 8:57 AM  [facility] [place], [place],  [address] Phone: [phone]   Fax:     Name: Andrea [name] MRN: [id number] Date of Birth: [birth year]

## 2020-08-01 ENCOUNTER — Other Ambulatory Visit: Payer: Self-pay | Admitting: Orthopaedic Surgery

## 2020-08-01 ENCOUNTER — Other Ambulatory Visit (HOSPITAL_COMMUNITY): Payer: Self-pay | Admitting: Orthopaedic Surgery

## 2020-08-01 DIAGNOSIS — M7062 Trochanteric bursitis, left hip: Secondary | ICD-10-CM

## 2020-08-16 ENCOUNTER — Ambulatory Visit
Admission: RE | Admit: 2020-08-16 | Discharge: 2020-08-16 | Disposition: A | Discharge status: Home / Self Care | Payer: Medicare Other | Source: Ambulatory Visit | Attending: Orthopaedic Surgery | Admitting: Orthopaedic Surgery

## 2020-08-16 ENCOUNTER — Other Ambulatory Visit: Payer: Self-pay

## 2020-08-16 DIAGNOSIS — M7062 Trochanteric bursitis, left hip: Secondary | ICD-10-CM | POA: Diagnosis present

## 2020-08-25 ENCOUNTER — Other Ambulatory Visit: Payer: Self-pay | Admitting: General Surgery

## 2020-08-25 DIAGNOSIS — M818 Other osteoporosis without current pathological fracture: Secondary | ICD-10-CM

## 2020-09-23 ENCOUNTER — Ambulatory Visit
Admission: RE | Admit: 2020-09-23 | Discharge: 2020-09-23 | Disposition: A | Discharge status: Home / Self Care | Payer: Medicare Other | Source: Ambulatory Visit | Attending: General Surgery | Admitting: General Surgery

## 2020-09-23 ENCOUNTER — Other Ambulatory Visit: Payer: Self-pay

## 2020-09-23 DIAGNOSIS — M818 Other osteoporosis without current pathological fracture: Secondary | ICD-10-CM | POA: Insufficient documentation

## 2020-09-23 DIAGNOSIS — Z1231 Encounter for screening mammogram for malignant neoplasm of breast: Secondary | ICD-10-CM | POA: Diagnosis present

## 2020-11-24 ENCOUNTER — Other Ambulatory Visit: Payer: Self-pay | Admitting: Physician Assistant

## 2020-11-24 DIAGNOSIS — Z86018 Personal history of other benign neoplasm: Secondary | ICD-10-CM

## 2020-12-08 ENCOUNTER — Ambulatory Visit
Admission: RE | Admit: 2020-12-08 | Discharge: 2020-12-08 | Disposition: A | Discharge status: Home / Self Care | Payer: Medicare Other | Source: Ambulatory Visit | Attending: Physician Assistant | Admitting: Physician Assistant

## 2020-12-08 ENCOUNTER — Other Ambulatory Visit: Payer: Self-pay

## 2020-12-08 DIAGNOSIS — Z86018 Personal history of other benign neoplasm: Secondary | ICD-10-CM | POA: Diagnosis present

## 2020-12-08 MED ORDER — GADOBUTROL 1 MMOL/ML IV SOLN
5.0000 mL | Freq: Once | INTRAVENOUS | Status: AC | PRN
  Administered 2020-12-08: 5 mL via INTRAVENOUS

## 2021-03-03 ENCOUNTER — Other Ambulatory Visit: Payer: Self-pay

## 2021-03-03 ENCOUNTER — Observation Stay
Admission: EM | Admit: 2021-03-03 | Discharge: 2021-03-04 | Disposition: A | Discharge status: Home / Self Care | Payer: Medicare Other | Attending: Family Medicine | Admitting: Family Medicine

## 2021-03-03 ENCOUNTER — Emergency Department: Payer: Medicare Other

## 2021-03-03 DIAGNOSIS — F17210 Nicotine dependence, cigarettes, uncomplicated: Secondary | ICD-10-CM | POA: Insufficient documentation

## 2021-03-03 DIAGNOSIS — M545 Low back pain, unspecified: Secondary | ICD-10-CM

## 2021-03-03 DIAGNOSIS — J189 Pneumonia, unspecified organism: Secondary | ICD-10-CM | POA: Diagnosis not present

## 2021-03-03 DIAGNOSIS — G14 Postpolio syndrome: Secondary | ICD-10-CM

## 2021-03-03 DIAGNOSIS — J9601 Acute respiratory failure with hypoxia: Principal | ICD-10-CM | POA: Insufficient documentation

## 2021-03-03 DIAGNOSIS — Z20822 Contact with and (suspected) exposure to covid-19: Secondary | ICD-10-CM | POA: Insufficient documentation

## 2021-03-03 DIAGNOSIS — I10 Essential (primary) hypertension: Secondary | ICD-10-CM | POA: Diagnosis not present

## 2021-03-03 DIAGNOSIS — Z853 Personal history of malignant neoplasm of breast: Secondary | ICD-10-CM | POA: Insufficient documentation

## 2021-03-03 DIAGNOSIS — J441 Chronic obstructive pulmonary disease with (acute) exacerbation: Secondary | ICD-10-CM | POA: Insufficient documentation

## 2021-03-03 DIAGNOSIS — Z87891 Personal history of nicotine dependence: Secondary | ICD-10-CM

## 2021-03-03 DIAGNOSIS — G8929 Other chronic pain: Secondary | ICD-10-CM

## 2021-03-03 DIAGNOSIS — Z79899 Other long term (current) drug therapy: Secondary | ICD-10-CM | POA: Diagnosis not present

## 2021-03-03 DIAGNOSIS — R42 Dizziness and giddiness: Secondary | ICD-10-CM | POA: Diagnosis present

## 2021-03-03 DIAGNOSIS — J9691 Respiratory failure, unspecified with hypoxia: Secondary | ICD-10-CM | POA: Diagnosis present

## 2021-03-03 LAB — COMPREHENSIVE METABOLIC PANEL
ALT: 13 U/L (ref 0–44)
AST: 24 U/L (ref 15–41)
Albumin: 2.7 g/dL — ABNORMAL LOW (ref 3.5–5.0)
Alkaline Phosphatase: 109 U/L (ref 38–126)
Anion gap: 5 (ref 5–15)
BUN: 21 mg/dL (ref 8–23)
CO2: 21 mmol/L — ABNORMAL LOW (ref 22–32)
Calcium: 7.8 mg/dL — ABNORMAL LOW (ref 8.9–10.3)
Chloride: 109 mmol/L (ref 98–111)
Creatinine, Ser: 1.22 mg/dL — ABNORMAL HIGH (ref 0.44–1.00)
GFR, Estimated: 47 mL/min — ABNORMAL LOW (ref >60)
Glucose, Bld: 101 mg/dL — ABNORMAL HIGH (ref 70–99)
Potassium: 3.2 mmol/L — ABNORMAL LOW (ref 3.5–5.1)
Sodium: 135 mmol/L (ref 135–145)
Total Bilirubin: 1.1 mg/dL (ref 0.3–1.2)
Total Protein: 5.7 g/dL — ABNORMAL LOW (ref 6.5–8.1)

## 2021-03-03 LAB — CBC
HCT: 29 % — ABNORMAL LOW (ref 36.0–46.0)
Hemoglobin: 10 g/dL — ABNORMAL LOW (ref 12.0–15.0)
MCH: 29.9 pg (ref 26.0–34.0)
MCHC: 34.5 g/dL (ref 30.0–36.0)
MCV: 86.8 fL (ref 80.0–100.0)
Platelets: 227 10*3/uL (ref 150–400)
RBC: 3.34 MIL/uL — ABNORMAL LOW (ref 3.87–5.11)
RDW: 14.9 % (ref 11.5–15.5)
WBC: 10.7 10*3/uL — ABNORMAL HIGH (ref 4.0–10.5)
nRBC: 0 % (ref 0.0–0.2)

## 2021-03-03 LAB — RESP PANEL BY RT-PCR (FLU A&B, COVID) ARPGX2
Influenza A by PCR: NEGATIVE
Influenza B by PCR: NEGATIVE
SARS Coronavirus 2 by RT PCR: NEGATIVE

## 2021-03-03 LAB — LACTIC ACID, PLASMA
Lactic Acid, Venous: 1.1 mmol/L (ref 0.5–1.9)
Lactic Acid, Venous: 1.2 mmol/L (ref 0.5–1.9)

## 2021-03-03 LAB — CREATININE, SERUM
Creatinine, Ser: 1.09 mg/dL — ABNORMAL HIGH (ref 0.44–1.00)
GFR, Estimated: 54 mL/min — ABNORMAL LOW (ref >60)

## 2021-03-03 LAB — BRAIN NATRIURETIC PEPTIDE: B Natriuretic Peptide: 134.3 pg/mL — ABNORMAL HIGH (ref 0.0–100.0)

## 2021-03-03 MED ORDER — METHYLPREDNISOLONE SODIUM SUCC 125 MG IJ SOLR
60.0000 mg | Freq: Every day | INTRAMUSCULAR | Status: DC
  Administered 2021-03-03 – 2021-03-04 (×2): 60 mg via INTRAVENOUS
  Filled 2021-03-03 (×2): qty 2

## 2021-03-03 MED ORDER — LACTATED RINGERS IV SOLN: INTRAVENOUS | Status: DC

## 2021-03-03 MED ORDER — SODIUM CHLORIDE 0.9 % IV SOLN: 500.0000 mg | INTRAVENOUS | Status: DC

## 2021-03-03 MED ORDER — PANTOPRAZOLE SODIUM 40 MG PO TBEC
40.0000 mg | DELAYED_RELEASE_TABLET | Freq: Every day | ORAL | Status: DC
  Administered 2021-03-04: 40 mg via ORAL
  Filled 2021-03-03: qty 1

## 2021-03-03 MED ORDER — UMECLIDINIUM-VILANTEROL 62.5-25 MCG/ACT IN AEPB
1.0000 | INHALATION_SPRAY | Freq: Every day | RESPIRATORY_TRACT | Status: DC
  Administered 2021-03-04: 1 via RESPIRATORY_TRACT
  Filled 2021-03-03: qty 14

## 2021-03-03 MED ORDER — TIZANIDINE HCL 4 MG PO TABS
4.0000 mg | ORAL_TABLET | Freq: Four times a day (QID) | ORAL | Status: DC | PRN
  Filled 2021-03-03: qty 1

## 2021-03-03 MED ORDER — IPRATROPIUM-ALBUTEROL 0.5-2.5 (3) MG/3ML IN SOLN
3.0000 mL | Freq: Four times a day (QID) | RESPIRATORY_TRACT | Status: DC
  Administered 2021-03-03 – 2021-03-04 (×4): 3 mL via RESPIRATORY_TRACT
  Filled 2021-03-03 (×4): qty 3

## 2021-03-03 MED ORDER — ATORVASTATIN CALCIUM 20 MG PO TABS
80.0000 mg | ORAL_TABLET | Freq: Every day | ORAL | Status: DC
  Administered 2021-03-03: 80 mg via ORAL
  Filled 2021-03-03: qty 4

## 2021-03-03 MED ORDER — SODIUM CHLORIDE 0.9 % IV SOLN
500.0000 mg | INTRAVENOUS | Status: DC
  Administered 2021-03-03: 500 mg via INTRAVENOUS
  Filled 2021-03-03 (×2): qty 500

## 2021-03-03 MED ORDER — ALBUTEROL SULFATE (2.5 MG/3ML) 0.083% IN NEBU: 2.5000 mg | INHALATION_SOLUTION | RESPIRATORY_TRACT | Status: DC | PRN

## 2021-03-03 MED ORDER — PREGABALIN 50 MG PO CAPS
100.0000 mg | ORAL_CAPSULE | Freq: Three times a day (TID) | ORAL | Status: DC
  Administered 2021-03-03 – 2021-03-04 (×2): 100 mg via ORAL
  Filled 2021-03-03 (×2): qty 2

## 2021-03-03 MED ORDER — HYDROCODONE-ACETAMINOPHEN 10-325 MG PO TABS
1.0000 | ORAL_TABLET | Freq: Four times a day (QID) | ORAL | Status: DC | PRN
Start: 2021-03-03 — End: 2021-03-04

## 2021-03-03 MED ORDER — SODIUM CHLORIDE 0.9 % IV SOLN
2.0000 g | INTRAVENOUS | Status: DC
  Administered 2021-03-03: 2 g via INTRAVENOUS
  Filled 2021-03-03 (×2): qty 20

## 2021-03-03 MED ORDER — ENOXAPARIN SODIUM 40 MG/0.4ML IJ SOSY
40.0000 mg | PREFILLED_SYRINGE | INTRAMUSCULAR | Status: DC
  Administered 2021-03-03: 40 mg via SUBCUTANEOUS
  Filled 2021-03-03: qty 0.4

## 2021-03-03 MED ORDER — SODIUM CHLORIDE 0.9 % IV SOLN
1000.0000 mL | Freq: Once | INTRAVENOUS | Status: AC
  Administered 2021-03-03: 1000 mL via INTRAVENOUS

## 2021-03-03 MED ORDER — SODIUM CHLORIDE 0.9 % IV SOLN: 2.0000 g | INTRAVENOUS | Status: DC

## 2021-03-03 MED ORDER — TRAZODONE HCL 50 MG PO TABS
50.0000 mg | ORAL_TABLET | Freq: Every evening | ORAL | Status: DC | PRN
  Administered 2021-03-03: 50 mg via ORAL
  Filled 2021-03-03: qty 1

## 2021-03-03 NOTE — Assessment & Plan Note (Signed)
The patient is requiring 3L to maintain saturations of 96%. This is due to Lt Lower lobe pneumonia and COPD exacerbation.

## 2021-03-03 NOTE — Assessment & Plan Note (Signed)
Continue Norco 10/325 mg on an as needed basis as at home.

## 2021-03-03 NOTE — ED Notes (Signed)
Pt ambulated to [facility] and back. With ambulation pt reported SOB and began coughing. O2 decreased to 91% on 3L [place].

## 2021-03-03 NOTE — Assessment & Plan Note (Signed)
Due to pneumonia. Patient is receiving duonebs, albuterol, and solumedrol. Monitor.

## 2021-03-03 NOTE — Assessment & Plan Note (Signed)
Left basilar infiltrate on CXR. Rales at left base with coarse wheezes throughout. WBC 10.7 The patient is receiving IV ceftriaxone and azithromycin. Blood cultures x 2 have been obtained.

## 2021-03-03 NOTE — ED Triage Notes (Signed)
Via [facility] from home. 90% on RA, 96% on 3L. Husband called medic for weakness, when medic arrived pressure was 72/40, after 800cc 0.9% NS pressure 72/37. BGL 141. Pt Aox4 but lethargic

## 2021-03-03 NOTE — H&P (Signed)
Andrea [name] is an 70 y.o. female.   Chief Complaint: Dizziness, confusion, cough, shortness of breath x several days. HPI: The patient is a 70 yr old woman who was brought to the [facility] ED on 03/03/2021 by her husband. She had had worsening shortness of breath which has been accompanied by confusion this morning. She was lethargic upon presentation in the ED.  The patient carries a past medical history significant for chronic constipation, GERD, Hyperlipidemia, Hypertension, Post-Polio Syndrome, Parkinsons, PVD, Vitamin D Def.  The patient denies fevers, but admits to chills. She has not have much PO intake for the last couple of days due to lack of appetite. She has a cough that is occasionally productive of purulent sputum. There is no nausea, vomiting, or diarrhea. No abdominal pain, swellling, or rashes, lesions or sores. She is very weak.  In the ED the patient was lethargic, hypoxic, and hypotensive at 72/37. She was saturating 90% on Room air. She was very weak. Potassium was low at 3.2, CO2 was 21, Creaining was increased at 1.22 from her baseline creatinine of 0.56. CXR demonstrated a left basilar consolidate. Given her hypotensive her heart rate of 58 was inappropriately low.  She was started on IV ceftriaxone and azithromycin in the ED. Blood cultures x 2 were obtained. She was give IV fluid resuscitation and beta agonist therapy.   [facility] hospitalists were consulted to admit the patient for further evaluation and treatment.  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   Cervical post-laminectomy syndrome    Chronic constipation    Complication of anesthesia    itching real bad or msucles spasms   Cubital tunnel syndrome    Depression    Disorders of sacrum    Disturbance of skin sensation    GERD  (gastroesophageal reflux disease)    History of colon polyps    Hot flashes    Hx of diplopia    right eye   Hyperlipidemia    Hypertension    Insomnia    Late effects of acute poliomyelitis    Lateral epicondylitis  of elbow    Loss of memory    Menopausal state    Osteoporosis    Pain in joint of left shoulder    Parkinson's disease ([place]) 2011   Peripheral vascular disease ([place]) 2011   venous stasis    Personal history of radiation therapy    Radiation 2008   BREAST CA   Tremor    Unspecified musculoskeletal disorders and symptoms referable to neck    cervical/trapezius   Vitamin D deficiency     Past Surgical History:  Procedure Laterality Date   ABDOMINAL HYSTERECTOMY  1997   ANTERIOR CERVICAL DECOMP/DISCECTOMY FUSION N/A 02/01/2019   Procedure: Anterior Cervical Decompression Fusion Cervical three-four, Cervical four-five, hardware removal Cervical five-six;  Surgeon: Earnie Larsson, MD;  Location: [facility];  Service: Neurosurgery;  Laterality: N/A;   BREAST LUMPECTOMY Right 2008   4 cm area of DCIS, margins less than 1 mm. Treated with wide excision, whole breast radiation   BREAST SURGERY Right December 04, 2009   Right simple mastectomy, sentinel node biopsy.   CARPAL TUNNEL RELEASE     COLONOSCOPY  [id number]   Dr. Allen Norris, Dr. Vira Agar   COLONOSCOPY  COLONOSCOPY WITH PROPOFOL N/A 05/23/2017   Procedure: COLONOSCOPY WITH PROPOFOL;  Surgeon: Manya Silvas, MD;  Location: [facility];  Service: Endoscopy;  Laterality: N/A;   ELBOW SURGERY Left 2011   ELBOW SURGERY Left 2015   ESOPHAGOGASTRODUODENOSCOPY     HIP SURGERY Right 06/19/12   LEG SURGERY Right 1958   Corrcetive surgery for polio   LEG SURGERY Left 1958   corrective surgery for polio   MASTECTOMY Right 2011   BREAST CA   rectocele/enterocelle repair and perinoplasty     SHOULDER ARTHROSCOPY W/ ROTATOR CUFF REPAIR Bilateral 2010,2012   SHOULDER ARTHROSCOPY WITH OPEN ROTATOR CUFF REPAIR Left 08/19/2015    Procedure: SHOULDER ARTHROSCOPY WITH  MINI OPEN ROTATOR CUFF REPAIR,SUBACROMIAL DECOMPRESSION, ARTHROSCOPIC BICEPS TENODESIS;  Surgeon: Thornton Park, MD;  Location: [facility];  Service: Orthopedics;  Laterality: Left;   SHOULDER SURGERY Left 2014   SPINE SURGERY     TOE FUSION Left 1970   little toe fusion   TONSILLECTOMY      Family History  Problem Relation Age of Onset   Cancer Mother    Cancer Father    Breast cancer Other 19   Social History:  reports that she has been smoking cigarettes. She has a 25.00 pack-year smoking history. She has never used smokeless tobacco. She reports that she does not drink alcohol and does not use drugs. (Not in a hospital admission)   Allergies:  Allergies  Allergen Reactions   Lisinopril     Raises BP    ROS: Twelve systems were reviewed with patient and were found to be negative with exception for those elements included in HPI above.  General appearance: alert, distracted, fatigued, and mild distress Head: Normocephalic, without obvious abnormality, atraumatic Eyes: conjunctivae/corneas clear. PERRL, EOM's intact. Fundi benign. Throat: lips, mucosa, and tongue normal; teeth and gums normal Neck: no adenopathy, no carotid bruit, no JVD, supple, symmetrical, trachea midline, and thyroid not enlarged, symmetric, no tenderness/mass/nodules Resp:  Positive for increased work of breathing with significant tachypnea, 2 work dyspnea, and accessory muscle use. Positive for scattered rhonchi throughout and rales at the left base. No tactile fremitus. Chest wall: no tenderness Cardio: regular rate and rhythm, S1, S2 normal, no murmur, click, rub or gallop GI: soft, non-tender; bowel sounds normal; no masses,  no organomegaly Extremities: extremities normal, atraumatic, no cyanosis or edema Pulses: 2+ and symmetric Skin: Skin color, texture, turgor normal. No rashes or lesions Lymph nodes: Cervical, supraclavicular, and axillary nodes  normal. Neurologic: Grossly normal Incision/Wound: None noted.  Results for orders placed or performed during the hospital encounter of 03/03/21 (from the past 48 hour(s))  CBC     Status: Abnormal   Collection Time: 03/03/21  1:30 PM  Result Value Ref Range   WBC 10.7 (H) 4.0 - 10.5 K/uL   RBC 3.34 (L) 3.87 - 5.11 MIL/uL   Hemoglobin 10.0 (L) 12.0 - 15.0 g/dL   HCT 29.0 (L) 36.0 - 46.0 %   MCV 86.8 80.0 - 100.0 fL   MCH 29.9 26.0 - 34.0 pg   MCHC 34.5 30.0 - 36.0 g/dL   RDW 14.9 11.5 - 15.5 %   Platelets 227 150 - 400 K/uL   nRBC 0.0 0.0 - 0.2 %    Comment: Performed at [facility], [address], [place], [place] [address]  Comprehensive metabolic panel     Status: Abnormal   Collection Time: 03/03/21  1:30 PM  Result Value Ref Range   Sodium 135  135 - 145 mmol/L   Potassium 3.2 (L) 3.5 - 5.1 mmol/L   Chloride 109 98 - 111 mmol/L   CO2 21 (L) 22 - 32 mmol/L   Glucose, Bld 101 (H) 70 - 99 mg/dL    Comment: Glucose reference range applies only to samples taken after fasting for at least 8 hours.   BUN 21 8 - 23 mg/dL   Creatinine, Ser 1.22 (H) 0.44 - 1.00 mg/dL   Calcium 7.8 (L) 8.9 - 10.3 mg/dL   Total Protein 5.7 (L) 6.5 - 8.1 g/dL   Albumin 2.7 (L) 3.5 - 5.0 g/dL   AST 24 15 - 41 U/L   ALT 13 0 - 44 U/L   Alkaline Phosphatase 109 38 - 126 U/L   Total Bilirubin 1.1 0.3 - 1.2 mg/dL   GFR, Estimated 47 (L) >60 mL/min    Comment: (NOTE) Calculated using the CKD-EPI Creatinine Equation (2021)    Anion gap 5 5 - 15    Comment: Performed at [facility], [address] [place], [place] [address]  Lactic acid, plasma     Status: None   Collection Time: 03/03/21  1:30 PM  Result Value Ref Range   Lactic Acid, Venous 1.1 0.5 - 1.9 mmol/L    Comment: Performed at [facility], [address] [place], [place] [address]  Brain natriuretic peptide     Status: Abnormal   Collection Time: 03/03/21  1:30 PM  Result Value Ref Range   B Natriuretic  Peptide 134.3 (H) 0.0 - 100.0 pg/mL    Comment: Performed at [facility], [address] [place], [place] [address]  Lactic acid, plasma     Status: None   Collection Time: 03/03/21  3:10 PM  Result Value Ref Range   Lactic Acid, Venous 1.2 0.5 - 1.9 mmol/L    Comment: Performed at [facility], [address] [place], [place] [address]  Resp Panel by RT-PCR (Flu A&B, Covid) Nasopharyngeal Swab     Status: None   Collection Time: 03/03/21  3:10 PM   Specimen: Nasopharyngeal Swab; Nasopharyngeal(NP) swabs in vial transport medium  Result Value Ref Range   SARS Coronavirus 2 by RT PCR NEGATIVE NEGATIVE    Comment: (NOTE) SARS-CoV-2 target nucleic acids are NOT DETECTED.  The SARS-CoV-2 RNA is generally detectable in upper respiratory specimens during the acute phase of infection. The lowest concentration of SARS-CoV-2 viral copies this assay can detect is 138 copies/mL. A negative result does not preclude SARS-Cov-2 infection and should not be used as the sole basis for treatment or other patient management decisions. A negative result may occur with  improper specimen collection/handling, submission of specimen other than nasopharyngeal swab, presence of viral mutation(s) within the areas targeted by this assay, and inadequate number of viral copies(<138 copies/mL). A negative result must be combined with clinical observations, patient history, and epidemiological information. The expected result is Negative.  Fact Sheet for Patients:  EntrepreneurPulse.com.au  Fact Sheet for Healthcare Providers:  IncredibleEmployment.be  This test is no t yet approved or cleared by the [place] FDA and  has been authorized for detection and/or diagnosis of SARS-CoV-2 by FDA under an Emergency Use Authorization (EUA). This EUA will remain  in effect (meaning this test can be used) for the duration of the COVID-19 declaration under  Section 564(b)(1) of the Act, 21 U.S.C.section 360bbb-3(b)(1), unless the authorization is terminated  or revoked sooner.       Influenza A  by PCR NEGATIVE NEGATIVE   Influenza B by PCR NEGATIVE NEGATIVE    Comment: (NOTE) The Xpert Xpress SARS-CoV-2/FLU/RSV plus assay is intended as an aid in the diagnosis of influenza from Nasopharyngeal swab specimens and should not be used as a sole basis for treatment. Nasal washings and aspirates are unacceptable for Xpert Xpress SARS-CoV-2/FLU/RSV testing.  Fact Sheet for Patients: EntrepreneurPulse.com.au  Fact Sheet for Healthcare Providers: IncredibleEmployment.be  This test is not yet approved or cleared by the [place] FDA and has been authorized for detection and/or diagnosis of SARS-CoV-2 by FDA under an Emergency Use Authorization (EUA). This EUA will remain in effect (meaning this test can be used) for the duration of the COVID-19 declaration under Section 564(b)(1) of the Act, 21 U.S.C. section 360bbb-3(b)(1), unless the authorization is terminated or revoked.  Performed at [facility], [address] [place], [place] [address]   Creatinine, serum     Status: Abnormal   Collection Time: 03/03/21  7:08 PM  Result Value Ref Range   Creatinine, Ser 1.09 (H) 0.44 - 1.00 mg/dL   GFR, Estimated 54 (L) >60 mL/min    Comment: (NOTE) Calculated using the CKD-EPI Creatinine Equation (2021) Performed at [facility], [address] [place], [place] [address]    @[email or web address] @  Blood pressure 134/62, pulse (!) 101, temperature 99.5 F (37.5 C), temperature source Oral, resp. rate 14, height 5' (1.524 m), weight 51.7 kg, SpO2 100 %.    Assessment/Plan Respiratory failure with hypoxia ([place]) The patient is requiring 3L to maintain saturations of 96%. This is due to Lt Lower lobe pneumonia and COPD exacerbation.  Pneumonia Left basilar infiltrate on CXR. Rales at left  base with coarse wheezes throughout. WBC 10.7 The patient is receiving IV ceftriaxone and azithromycin. Blood cultures x 2 have been obtained.  COPD with acute exacerbation (HCC) Due to pneumonia. Patient is receiving duonebs, albuterol, and solumedrol. Monitor.  Chronic low back pain Continue Norco 10/325 mg on an as needed basis as at home.   Post-polio syndrome Continue Lyrica and zanaflex as at home.    Mccormick Macon 03/03/2021, 8:45 PM

## 2021-03-03 NOTE — Assessment & Plan Note (Signed)
Continue Lyrica and zanaflex as at home.

## 2021-03-03 NOTE — ED Provider Notes (Signed)
[facility] Emergency Department Provider Note   ____________________________________________    I have reviewed the triage vital signs and the nursing notes.   HISTORY  Chief Complaint Hypotension (From home)     HPI Andrea [name] is a 70 y.o. female with past medical history as detailed below who presents with complaints of dizziness, fatigue, cough and shortness of breath.  Her husband reports that the patient seemed somewhat confused at times as well.  He reports this is been ongoing for several days now but seems to be worse today.  He reports her cough is at times severe.  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   Cervical post-laminectomy syndrome    Chronic constipation    Complication of anesthesia    itching real bad or msucles spasms   Cubital tunnel syndrome    Depression    Disorders of sacrum    Disturbance of skin sensation    GERD (gastroesophageal reflux disease)    History of colon polyps    Hot flashes    Hx of diplopia    right eye   Hyperlipidemia    Hypertension    Insomnia    Late effects of acute poliomyelitis    Lateral epicondylitis  of elbow    Loss of memory    Menopausal state    Osteoporosis    Pain in joint of left shoulder    Parkinson's disease ([place]) 2011   Peripheral vascular disease ([place]) 2011   venous stasis    Personal history of radiation therapy    Radiation 2008   BREAST CA   Tremor    Unspecified musculoskeletal disorders and symptoms referable to neck    cervical/trapezius   Vitamin D deficiency     Patient Active Problem List   Diagnosis Date Noted   Cervical spondylosis with myelopathy and radiculopathy 02/01/2019   B12 deficiency 07/29/2018   Personal history of tobacco use, presenting hazards to health  06/13/2016   Personal history of malignant neoplasm of breast 07/26/2014   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   Lumbosacral spondylosis without myelopathy 04/30/2013   Subacute lumbar radiculopathy 04/30/2013   Chronic low back pain 02/19/2013   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   Trochanteric bursitis of right hip 04/11/2012   Sacroiliac joint dysfunction of both sides 07/12/2011    Past Surgical History:  Procedure Laterality Date   ABDOMINAL HYSTERECTOMY  1997   ANTERIOR CERVICAL DECOMP/DISCECTOMY FUSION N/A 02/01/2019   Procedure: Anterior Cervical Decompression Fusion Cervical three-four, Cervical four-five, hardware removal Cervical five-six;  Surgeon: Earnie Larsson, MD;  Location: [facility];  Service: Neurosurgery;  Laterality: N/A;   BREAST LUMPECTOMY Right 2008   4 cm area of DCIS, margins less than 1 mm. Treated with wide excision, whole breast radiation   BREAST SURGERY Right December 04, 2009   Right simple mastectomy, sentinel node biopsy.   CARPAL TUNNEL RELEASE     COLONOSCOPY  [id number]   Dr. Allen Norris, Dr. Vira Agar   COLONOSCOPY     COLONOSCOPY WITH PROPOFOL N/A 05/23/2017   Procedure: COLONOSCOPY WITH PROPOFOL;  Surgeon: Manya Silvas, MD;  Location: [facility];  Service: Endoscopy;  Laterality: N/A;   ELBOW SURGERY Left 2011   ELBOW SURGERY Left 2015   ESOPHAGOGASTRODUODENOSCOPY  HIP SURGERY Right 06/19/12   LEG SURGERY Right 1958   Corrcetive surgery for polio   LEG SURGERY Left 1958   corrective surgery for polio   MASTECTOMY Right 2011   BREAST CA   rectocele/enterocelle repair and perinoplasty     SHOULDER ARTHROSCOPY W/ ROTATOR CUFF REPAIR Bilateral 2010,2012   SHOULDER ARTHROSCOPY WITH OPEN ROTATOR CUFF REPAIR Left 08/19/2015   Procedure: SHOULDER ARTHROSCOPY WITH  MINI OPEN ROTATOR CUFF REPAIR,SUBACROMIAL DECOMPRESSION, ARTHROSCOPIC BICEPS TENODESIS;  Surgeon: Thornton Park, MD;  Location: [facility];  Service: Orthopedics;  Laterality: Left;   SHOULDER  SURGERY Left 2014   SPINE SURGERY     TOE FUSION Left 1970   little toe fusion   TONSILLECTOMY      Prior to Admission medications   Medication Sig Start Date End Date Taking? Authorizing Provider  amLODipine (NORVASC) 5 MG tablet Take 5 mg by mouth every morning.     [provider]  atorvastatin (LIPITOR) 80 MG tablet Take 80 mg by mouth daily. 05/03/14   [provider]  Biotin 10000 MCG TABS Take 10,000 mcg by mouth daily.    [provider]  Calcium Carbonate-Vitamin D (CALCIUM 500 + D PO) Take 1 tablet by mouth daily.    [provider]  diphenhydrAMINE (BENADRYL) 50 MG tablet Take 50 mg by mouth at bedtime as needed for sleep.    [provider]  famotidine (PEPCID) 20 MG tablet Take 20 mg by mouth 2 (two) times daily.    [provider]  fexofenadine (ALLEGRA) 60 MG tablet Take 60 mg by mouth 4 (four) times daily as needed for allergies or rhinitis.    [provider]  HYDROcodone-acetaminophen (NORCO) 10-325 MG tablet Take 1 tablet by mouth every 6 (six) hours as needed for moderate pain.     [provider]  losartan (COZAAR) 50 MG tablet Take 50 mg by mouth every morning.     [provider]  meloxicam (MOBIC) 7.5 MG tablet Take 7.5 mg by mouth 2 (two) times daily.     [provider]  omeprazole (PRILOSEC) 20 MG capsule Take 20 mg by mouth every morning.  01/26/12   [provider]  polyethylene glycol (MIRALAX / GLYCOLAX) packet Take 17 g by mouth daily as needed for moderate constipation.     [provider]  pregabalin (LYRICA) 100 MG capsule Take 100 mg by mouth 3 (three) times daily.     [provider]  tiZANidine (ZANAFLEX) 4 MG tablet Take 4 mg by mouth 4 (four) times daily as needed for muscle spasms.  07/20/15   [provider]  traZODone (DESYREL) 50 MG tablet Take 50 mg by mouth at bedtime as needed for sleep.     [provider]   triamcinolone (NASACORT ALLERGY 24HR) 55 MCG/ACT AERO nasal inhaler Place 2 sprays into the nose daily as needed (allergies).    [provider]  venlafaxine XR (EFFEXOR-XR) 150 MG 24 hr capsule Take 150 mg by mouth daily with breakfast.  07/21/18 07/21/19  [provider]  vitamin B-12 (CYANOCOBALAMIN) 1000 MCG tablet Take 1,000 mcg by mouth daily.    [provider]     Allergies Lisinopril  Family History  Problem Relation Age of Onset   Cancer Mother    Cancer Father    Breast cancer Other 60    Social History Social History   Tobacco Use   Smoking status: Every Day    Packs/day:  0.50    Years: 50.00    Pack years: 25.00    Types: Cigarettes    Last attempt to quit: 07/18/2017    Years since quitting: 3.6   Smokeless tobacco: Never   Tobacco comments:    Patient states she is already working with PCP to quit  Vaping Use   Vaping Use: Never used  Substance Use Topics   Alcohol use: No    Comment: No alcohol in the last 1.5 years   Drug use: No    Review of Systems  Constitutional: No fever/chills Eyes: No visual changes.  ENT: No sore throat. Cardiovascular: Denies chest pain. Respiratory: Denies shortness of breath.  Positive cough Gastrointestinal: No abdominal pain.  No nausea, no vomiting.   Genitourinary: Negative for dysuria. Musculoskeletal: Negative for back pain. Skin: Negative for rash. Neurological: Negative for headaches or weakness   ____________________________________________   PHYSICAL EXAM:  VITAL SIGNS: ED Triage Vitals  Enc Vitals Group     BP 03/03/21 1259 (!) 99/54     Pulse Rate 03/03/21 1259 (!) 58     Resp 03/03/21 1259 19     Temp 03/03/21 1259 99.5 F (37.5 C)     Temp Source 03/03/21 1259 Oral     SpO2 03/03/21 1259 96 %     Weight 03/03/21 1253 51.7 kg (114 lb)     Height 03/03/21 1253 1.524 m (5')     Head Circumference --      Peak Flow --      Pain Score 03/03/21 1253 4     Pain Loc --       Pain Edu? --      Excl. in [place]? --     Constitutional: Alert and oriented.  Eyes: Conjunctivae are normal.  Head: Atraumatic. Nose: No congestion/rhinnorhea. Mouth/Throat: Mucous membranes are moist.    Cardiovascular: Normal rate, regular rhythm. Grossly normal heart sounds.  Good peripheral circulation. Respiratory: Normal respiratory effort.  No retractions. Lungs CTAB. Gastrointestinal: Soft and nontender. No distention.   Genitourinary: deferred Musculoskeletal: No lower extremity tenderness nor edema.  Warm and well perfused Neurologic:  Normal speech and language. No gross focal neurologic deficits are appreciated.  Skin:  Skin is warm, dry and intact. No rash noted. Psychiatric: Mood and affect are normal. Speech and behavior are normal.  ____________________________________________   LABS (all labs ordered are listed, but only abnormal results are displayed)  Labs Reviewed  CBC - Abnormal; Notable for the following components:      Result Value   WBC 10.7 (*)    RBC 3.34 (*)    Hemoglobin 10.0 (*)    HCT 29.0 (*)    All other components within normal limits  COMPREHENSIVE METABOLIC PANEL - Abnormal; Notable for the following components:   Potassium 3.2 (*)    CO2 21 (*)    Glucose, Bld 101 (*)    Creatinine, Ser 1.22 (*)    Calcium 7.8 (*)    Total Protein 5.7 (*)    Albumin 2.7 (*)    GFR, Estimated 47 (*)    All other components within normal limits  BRAIN NATRIURETIC PEPTIDE - Abnormal; Notable for the following components:   B Natriuretic Peptide 134.3 (*)    All other components within normal limits  CULTURE, BLOOD (SINGLE)  RESP PANEL BY RT-PCR (FLU A&B, COVID) ARPGX2  CULTURE, BLOOD (SINGLE)  LACTIC ACID, PLASMA  LACTIC ACID, PLASMA   ____________________________________________  EKG   ____________________________________________  RADIOLOGY  Chest x-ray viewed by me, consistent with right lower lobe  pneumonia ____________________________________________   PROCEDURES  Procedure(s) performed: No  Procedures   Critical Care performed: yes  CRITICAL CARE Performed by: Lavonia Drafts   Total critical care time: 30 minutes  Critical care time was exclusive of separately billable procedures and treating other patients.  Critical care was necessary to treat or prevent imminent or life-threatening deterioration.  Critical care was time spent personally by me on the following activities: development of treatment plan with patient and/or surrogate as well as nursing, discussions with consultants, evaluation of patient's response to treatment, examination of patient, obtaining history from patient or surrogate, ordering and performing treatments and interventions, ordering and review of laboratory studies, ordering and review of radiographic studies, pulse oximetry and re-evaluation of patient's condition.  ____________________________________________   INITIAL IMPRESSION / ASSESSMENT AND PLAN / ED COURSE  Pertinent labs & imaging results that were available during my care of the patient were reviewed by me and considered in my medical decision making (see chart for details).   Patient presents with dizziness, confusion, cough and shortness of breath.  Found to be hypoxic here in the emergency department with saturations in the 80s on room air, improved significantly with nasal cannula oxygen.  Given her description suspicious for pneumonia  Chest x-ray is consistent with pneumonia, lactic acid is normal, mild elevation of white blood cell count.  Will treat with IV Rocephin and IV azithromycin and admit to the hospitalist service    ____________________________________________   FINAL CLINICAL IMPRESSION(S) / ED DIAGNOSES  Final diagnoses:  Community acquired pneumonia of right lower lobe of lung        Note:  This document was prepared using Dragon voice recognition  software and may include unintentional dictation errors.    Lavonia Drafts, MD 03/03/21 1447

## 2021-03-04 DIAGNOSIS — J9601 Acute respiratory failure with hypoxia: Secondary | ICD-10-CM | POA: Diagnosis not present

## 2021-03-04 LAB — BLOOD CULTURE ID PANEL (REFLEXED) - BCID2

## 2021-03-04 LAB — CBC WITH DIFFERENTIAL/PLATELET
Abs Immature Granulocytes: 0.03 10*3/uL (ref 0.00–0.07)
Basophils Absolute: 0.1 10*3/uL (ref 0.0–0.1)
Basophils Relative: 0 %
Eosinophils Absolute: 0 10*3/uL (ref 0.0–0.5)
Eosinophils Relative: 0 %
HCT: 33.4 % — ABNORMAL LOW (ref 36.0–46.0)
Hemoglobin: 11.4 g/dL — ABNORMAL LOW (ref 12.0–15.0)
Immature Granulocytes: 0 %
Lymphocytes Relative: 3 %
Lymphs Abs: 0.4 10*3/uL — ABNORMAL LOW (ref 0.7–4.0)
MCH: 29.4 pg (ref 26.0–34.0)
MCHC: 34.1 g/dL (ref 30.0–36.0)
MCV: 86.1 fL (ref 80.0–100.0)
Monocytes Absolute: 0.2 10*3/uL (ref 0.1–1.0)
Monocytes Relative: 2 %
Neutro Abs: 12.3 10*3/uL — ABNORMAL HIGH (ref 1.7–7.7)
Neutrophils Relative %: 95 %
Platelets: 281 10*3/uL (ref 150–400)
RBC: 3.88 MIL/uL (ref 3.87–5.11)
RDW: 15.1 % (ref 11.5–15.5)
Smear Review: NORMAL
WBC: 13.1 10*3/uL — ABNORMAL HIGH (ref 4.0–10.5)
nRBC: 0 % (ref 0.0–0.2)

## 2021-03-04 LAB — COMPREHENSIVE METABOLIC PANEL
ALT: 15 U/L (ref 0–44)
AST: 25 U/L (ref 15–41)
Albumin: 3 g/dL — ABNORMAL LOW (ref 3.5–5.0)
Alkaline Phosphatase: 124 U/L (ref 38–126)
Anion gap: 9 (ref 5–15)
BUN: 15 mg/dL (ref 8–23)
CO2: 19 mmol/L — ABNORMAL LOW (ref 22–32)
Calcium: 8.4 mg/dL — ABNORMAL LOW (ref 8.9–10.3)
Chloride: 112 mmol/L — ABNORMAL HIGH (ref 98–111)
Creatinine, Ser: 0.99 mg/dL (ref 0.44–1.00)
GFR, Estimated: >60 mL/min (ref >60)
Glucose, Bld: 135 mg/dL — ABNORMAL HIGH (ref 70–99)
Potassium: 3.3 mmol/L — ABNORMAL LOW (ref 3.5–5.1)
Sodium: 140 mmol/L (ref 135–145)
Total Bilirubin: 0.7 mg/dL (ref 0.3–1.2)
Total Protein: 6.4 g/dL — ABNORMAL LOW (ref 6.5–8.1)

## 2021-03-04 LAB — HIV ANTIBODY (ROUTINE TESTING W REFLEX): HIV Screen 4th Generation wRfx: NONREACTIVE

## 2021-03-04 MED ORDER — PREDNISONE 20 MG PO TABS: 40.0000 mg | ORAL_TABLET | Freq: Every day | ORAL | 0 refills | Status: AC

## 2021-03-04 MED ORDER — AMOXICILLIN 500 MG PO CAPS: 1000.0000 mg | ORAL_CAPSULE | Freq: Three times a day (TID) | ORAL | 0 refills | Status: DC

## 2021-03-04 MED ORDER — AZITHROMYCIN 250 MG PO TABS
500.0000 mg | ORAL_TABLET | Freq: Once | ORAL | Status: AC
Start: 2021-03-04 — End: 2021-03-04
  Administered 2021-03-04: 500 mg via ORAL
  Filled 2021-03-04: qty 2

## 2021-03-04 MED ORDER — AMOXICILLIN 500 MG PO CAPS
1000.0000 mg | ORAL_CAPSULE | Freq: Once | ORAL | Status: AC
  Administered 2021-03-04: 1000 mg via ORAL
  Filled 2021-03-04: qty 2

## 2021-03-04 MED ORDER — AZITHROMYCIN 500 MG PO TABS: 500.0000 mg | ORAL_TABLET | Freq: Every day | ORAL | 0 refills | Status: AC

## 2021-03-04 MED ORDER — ALBUTEROL SULFATE HFA 108 (90 BASE) MCG/ACT IN AERS: 2.0000 | INHALATION_SPRAY | Freq: Four times a day (QID) | RESPIRATORY_TRACT | 2 refills | Status: AC | PRN

## 2021-03-04 MED ORDER — AMOXICILLIN 500 MG PO CAPS: 1000.0000 mg | ORAL_CAPSULE | Freq: Three times a day (TID) | ORAL | 0 refills | Status: AC

## 2021-03-04 MED ORDER — POTASSIUM CHLORIDE CRYS ER 20 MEQ PO TBCR
40.0000 meq | EXTENDED_RELEASE_TABLET | ORAL | Status: AC
  Administered 2021-03-04 (×2): 40 meq via ORAL
  Filled 2021-03-04 (×2): qty 2

## 2021-03-04 NOTE — Progress Notes (Signed)
PHARMACY - PHYSICIAN COMMUNICATION CRITICAL VALUE ALERT - BLOOD CULTURE IDENTIFICATION (BCID)  BCID results:  1 (anaerobic) bottle of 4 w/ Staph Epi, no resistance.  Pt currently on Azithromycin and Ceftriaxone x 5 days. Transition pt from Ceftriaxone to Cefazolin 2 gm?  Do you want to DC or continue Azithromycin for CAP coverage?  Name of physician contacted: A.C. Cephus Slater, MD  Passed off Follow Up to D Zeigler / R. Raquel Sarna.  Renda Rolls, PharmD, [facility] 03/04/2021 7:08 AM

## 2021-03-04 NOTE — Care Management CC44 (Signed)
Condition Code 44 Documentation Completed  Patient Details  Name: JACKQUELYN SUNDBERG MRN: [id number] Date of Birth: [birth year]   Condition Code 44 given:  Yes Patient signature on Condition Code 44 notice:  Yes Documentation of 2 MD's agreement:  Yes Code 44 added to claim:  Yes    Beverly Sessions, RN 03/04/2021, 3:11 PM

## 2021-03-04 NOTE — Plan of Care (Signed)

## 2021-03-04 NOTE — Discharge Summary (Signed)
Physician Discharge Summary  Andrea [name] EHM:[id number] DOB: 07-03-1950 DOA: 03/03/2021  PCP: Baxter Hire, MD  Admit date: 03/03/2021 Discharge date: 03/04/2021  Time spent: 40 minutes  Recommendations for Outpatient Follow-up:  Follow outpatient CBC/CMP Follow repeat blood cultures Holding BP meds - follow outpatient Follow CXR in 3 weeks or so Follow A1c outpatient   Discharge Diagnoses:  Principal Problem:   Respiratory failure with hypoxia ([place]) Active Problems:   Post-polio syndrome   Chronic low back pain   Personal history of tobacco use, presenting hazards to health   Pneumonia   COPD with acute exacerbation [facility])   Discharge Condition: stable  Diet recommendation: heart healthy  Filed Weights   03/03/21 1253  Weight: 51.7 kg    History of present illness:  70 yo with hx GERD, post polio syndrome, HLD, parkinsons, HTN and multiple other medical problems who presented to the hospital with confusion, lethargy, and hypotension.  Now found to have acute hypoxic resp failure 2/2 CAP and COPD exacerbation.  She's improved on hospital day 1, plan for discharge with outpt follow up.    Hospital Course:  Acute Hypoxic Respiratory Failure Community Acquired Pneumonia COPD Exacerbation Required 3 L at presentation CXR with L basilar opacity concerning for pneumonia No wheezing today, but thought to have sx c/w COPD exacerbation at presentation  She's improved at this time, did well on RA with therapy Will continue abx at discharge, discharge on amoxicillin/azithromycin and short course of steroids Needs repeat CXR in [name] few weeks Fever noted 11/1 PM, now resolved, follow outpatient  Staph Epidermidis Bacteremia Follow repeat cultures Only in 1 bottle so far, likely Sandford Diop contaminant Follow   Hypotension  Hypertension Hypotensive at presentation - reported falls likely related to this - no head trauma or MSK pain noted on exam Hypotension related to infection +/-  poor PO intake/volume depletion Bp normalized Hold antihypertensives at discharge   Chronic Low Back Pain Home meds  Post Polio Syndrome Lyrica, zanaflex  Procedures: none  Consultations: none  Discharge Exam: Vitals:   03/04/21 1246 03/04/21 1337  BP:    Pulse: 98   Resp: 18   Temp:    SpO2: 98% 99%   Feeling well, eager to get home if possible Called husband, no answer  General: No acute distress. Cardiovascular: Heart sounds show Nehemias Sauceda regular rate, and rhythm. Lungs: Clear to auscultation bilaterally  Abdomen: Soft, nontender, nondistended  Neurological: Alert and oriented 3. Moves all extremities 4  Cranial nerves II through XII grossly intact. Skin: Warm and dry. No rashes or lesions. Extremities: No clubbing or cyanosis. No edema.  Discharge Instructions   Discharge Instructions     Call MD for:  difficulty breathing, headache or visual disturbances   Complete by: As directed    Call MD for:  extreme fatigue   Complete by: As directed    Call MD for:  hives   Complete by: As directed    Call MD for:  persistant dizziness or light-headedness   Complete by: As directed    Call MD for:  persistant nausea and vomiting   Complete by: As directed    Call MD for:  redness, tenderness, or signs of infection (pain, swelling, redness, odor or green/yellow discharge around incision site)   Complete by: As directed    Call MD for:  severe uncontrolled pain   Complete by: As directed    Call MD for:  temperature >100.4   Complete by: As directed  Diet - low sodium heart healthy   Complete by: As directed    Discharge instructions   Complete by: As directed    You were admitted for pneumonia.  You've improved with antibiotics and steroids.  Continue your antibiotics as prescribed.  I'll also send another 3 days of steroids.  You'll need Aryelle Figg repeat x ray in the next several weeks to ensure your chest x ray findings resolve.   You had 1 blood culture return  positive, but the bacteria is Chima Astorino common contaminant.  Please follow up your final culture results with your PCP.  Your blood sugars are high related to steroids.  Please follow this up with your PCP, you'll need an A1c check outpatient.  Hold your amlodipine and losartan until you follow up with your PCP since your blood pressures were low when you got here.  Return for new, recurrent, or worsening symptoms.  Please ask your PCP to request records from this hospitalization so they know what was done and what the next steps will be.   Increase activity slowly   Complete by: As directed       Allergies as of 03/04/2021       Reactions   Lisinopril    Raises BP        Medication List     STOP taking these medications    amLODipine 5 MG tablet Commonly known as: NORVASC   losartan 50 MG tablet Commonly known as: COZAAR       TAKE these medications    albuterol 108 (90 Base) MCG/ACT inhaler Commonly known as: VENTOLIN HFA Inhale 2 puffs into the lungs every 6 (six) hours as needed for wheezing or shortness of breath.   amoxicillin 500 MG capsule Commonly known as: AMOXIL Take 2 capsules (1,000 mg total) by mouth 3 (three) times daily for 6 days.   atorvastatin 80 MG tablet Commonly known as: LIPITOR Take 80 mg by mouth daily.   azithromycin 500 MG tablet Commonly known as: Zithromax Take 1 tablet (500 mg total) by mouth daily for 3 days. Take 1 tablet daily for 3 days. Start taking on: March 05, 2021   Biotin 10000 MCG Tabs Take 10,000 mcg by mouth daily.   CALCIUM 500 + D PO Take 1 tablet by mouth daily.   diphenhydrAMINE 50 MG tablet Commonly known as: BENADRYL Take 50 mg by mouth at bedtime as needed for sleep.   famotidine 20 MG tablet Commonly known as: PEPCID Take 20 mg by mouth 2 (two) times daily.   fexofenadine 60 MG tablet Commonly known as: ALLEGRA Take 60 mg by mouth 4 (four) times daily as needed for allergies or rhinitis.    HYDROcodone-acetaminophen 10-325 MG tablet Commonly known as: NORCO Take 1 tablet by mouth every 6 (six) hours as needed for moderate pain.   meloxicam 7.5 MG tablet Commonly known as: MOBIC Take 7.5 mg by mouth 2 (two) times daily.   Nasacort Allergy 24HR 55 MCG/ACT Aero nasal inhaler Generic drug: triamcinolone Place 2 sprays into the nose daily as needed (allergies).   omeprazole 20 MG capsule Commonly known as: PRILOSEC Take 20 mg by mouth every morning.   polyethylene glycol 17 g packet Commonly known as: MIRALAX / GLYCOLAX Take 17 g by mouth daily as needed for moderate constipation.   predniSONE 20 MG tablet Commonly known as: DELTASONE Take 2 tablets (40 mg total) by mouth daily for 3 days. Start taking on: March 05, 2021   pregabalin 100  MG capsule Commonly known as: LYRICA Take 100 mg by mouth 3 (three) times daily.   tiZANidine 4 MG tablet Commonly known as: ZANAFLEX Take 4 mg by mouth 4 (four) times daily as needed for muscle spasms.   traZODone 50 MG tablet Commonly known as: DESYREL Take 50 mg by mouth at bedtime as needed for sleep.   venlafaxine XR 150 MG 24 hr capsule Commonly known as: EFFEXOR-XR Take 150 mg by mouth daily with breakfast.   vitamin B-12 1000 MCG tablet Commonly known as: CYANOCOBALAMIN Take 1,000 mcg by mouth daily.        Allergies  Allergen Reactions   Lisinopril     Raises BP      The results of significant diagnostics from this hospitalization (including imaging, microbiology, ancillary and laboratory) are listed below for reference.    Significant Diagnostic Studies: DG Chest 2 View  Result Date: 03/03/2021 CLINICAL DATA:  Possible pneumonia, weakness. EXAM: CHEST - 2 VIEW COMPARISON:  March 12, 2019. FINDINGS: The heart size and mediastinal contours are within normal limits. Right lung is clear. Left basilar opacity is noted concerning for pneumonia. Status post left shoulder arthroplasty. IMPRESSION: Left  basilar opacity concerning Electronically Signed   By: Marijo Conception M.D.   On: 03/03/2021 13:51    Microbiology: Recent Results (from the past 240 hour(s))  Blood culture (single)     Status: None (Preliminary result)   Collection Time: 03/03/21  1:30 PM   Specimen: BLOOD  Result Value Ref Range Status   Specimen Description BLOOD BLOOD RIGHT ARM  Final   Special Requests   Final    BOTTLES DRAWN AEROBIC AND ANAEROBIC Blood Culture adequate volume   Culture  Setup Time   Final    GRAM POSITIVE COCCI ANAEROBIC BOTTLE ONLY Organism ID to follow CRITICAL RESULT CALLED TO, READ BACK BY AND VERIFIED WITH: Lloyd Huger PHARMD 03/04/21 6440 SLM Performed at [facility], [address] [place], [place] [address]    Culture GRAM POSITIVE COCCI  Final   Report Status PENDING  Incomplete  Blood Culture ID Panel (Reflexed)     Status: Abnormal   Collection Time: 03/03/21  1:30 PM  Result Value Ref Range Status   Enterococcus faecalis NOT DETECTED NOT DETECTED Final   Enterococcus Faecium NOT DETECTED NOT DETECTED Final   Listeria monocytogenes NOT DETECTED NOT DETECTED Final   Staphylococcus species DETECTED (Tosca Pletz) NOT DETECTED Final    Comment: CRITICAL RESULT CALLED TO, READ BACK BY AND VERIFIED WITH: NATHAN BELUE 03/04/21 0648 SLM    Staphylococcus aureus (BCID) NOT DETECTED NOT DETECTED Final   Staphylococcus epidermidis DETECTED (Riaz Onorato) NOT DETECTED Final    Comment: CRITICAL RESULT CALLED TO, READ BACK BY AND VERIFIED WITH: NATHAN BELUE 03/04/21 0648 SLM    Staphylococcus lugdunensis NOT DETECTED NOT DETECTED Final   Streptococcus species NOT DETECTED NOT DETECTED Final   Streptococcus agalactiae NOT DETECTED NOT DETECTED Final   Streptococcus pneumoniae NOT DETECTED NOT DETECTED Final   Streptococcus pyogenes NOT DETECTED NOT DETECTED Final   Elynn Patteson.calcoaceticus-baumannii NOT DETECTED NOT DETECTED Final   Bacteroides fragilis NOT DETECTED NOT DETECTED Final   Enterobacterales  NOT DETECTED NOT DETECTED Final   Enterobacter cloacae complex NOT DETECTED NOT DETECTED Final   Escherichia coli NOT DETECTED NOT DETECTED Final   Klebsiella aerogenes NOT DETECTED NOT DETECTED Final   Klebsiella oxytoca NOT DETECTED NOT DETECTED Final   Klebsiella pneumoniae NOT DETECTED NOT DETECTED Final   Proteus species NOT  DETECTED NOT DETECTED Final   Salmonella species NOT DETECTED NOT DETECTED Final   Serratia marcescens NOT DETECTED NOT DETECTED Final   Haemophilus influenzae NOT DETECTED NOT DETECTED Final   Neisseria meningitidis NOT DETECTED NOT DETECTED Final   Pseudomonas aeruginosa NOT DETECTED NOT DETECTED Final   Stenotrophomonas maltophilia NOT DETECTED NOT DETECTED Final   Candida albicans NOT DETECTED NOT DETECTED Final   Candida auris NOT DETECTED NOT DETECTED Final   Candida glabrata NOT DETECTED NOT DETECTED Final   Candida krusei NOT DETECTED NOT DETECTED Final   Candida parapsilosis NOT DETECTED NOT DETECTED Final   Candida tropicalis NOT DETECTED NOT DETECTED Final   Cryptococcus neoformans/gattii NOT DETECTED NOT DETECTED Final   Methicillin resistance mecA/C NOT DETECTED NOT DETECTED Final    Comment: Performed at [facility], [address] [place], [place] [address]  Resp Panel by RT-PCR (Flu Caven Perine&B, Covid) Nasopharyngeal Swab     Status: None   Collection Time: 03/03/21  3:10 PM   Specimen: Nasopharyngeal Swab; Nasopharyngeal(NP) swabs in vial transport medium  Result Value Ref Range Status   SARS Coronavirus 2 by RT PCR NEGATIVE NEGATIVE Final    Comment: (NOTE) SARS-CoV-2 target nucleic acids are NOT DETECTED.  The SARS-CoV-2 RNA is generally detectable in upper respiratory specimens during the acute phase of infection. The lowest concentration of SARS-CoV-2 viral copies this assay can detect is 138 copies/mL. Daviona Herbert negative result does not preclude SARS-Cov-2 infection and should not be used as the sole basis for treatment or other patient  management decisions. Jaren Vanetten negative result may occur with  improper specimen collection/handling, submission of specimen other than nasopharyngeal swab, presence of viral mutation(s) within the areas targeted by this assay, and inadequate number of viral copies(<138 copies/mL). Jamesetta Greenhalgh negative result must be combined with clinical observations, patient history, and epidemiological information. The expected result is Negative.  Fact Sheet for Patients:  EntrepreneurPulse.com.au  Fact Sheet for Healthcare Providers:  IncredibleEmployment.be  This test is no t yet approved or cleared by the [place] FDA and  has been authorized for detection and/or diagnosis of SARS-CoV-2 by FDA under an Emergency Use Authorization (EUA). This EUA will remain  in effect (meaning this test can be used) for the duration of the COVID-19 declaration under Section 564(b)(1) of the Act, 21 U.S.C.section 360bbb-3(b)(1), unless the authorization is terminated  or revoked sooner.       Influenza Jesselyn Rask by PCR NEGATIVE NEGATIVE Final   Influenza B by PCR NEGATIVE NEGATIVE Final    Comment: (NOTE) The Xpert Xpress SARS-CoV-2/FLU/RSV plus assay is intended as an aid in the diagnosis of influenza from Nasopharyngeal swab specimens and should not be used as Nadra Hritz sole basis for treatment. Nasal washings and aspirates are unacceptable for Xpert Xpress SARS-CoV-2/FLU/RSV testing.  Fact Sheet for Patients: EntrepreneurPulse.com.au  Fact Sheet for Healthcare Providers: IncredibleEmployment.be  This test is not yet approved or cleared by the [place] FDA and has been authorized for detection and/or diagnosis of SARS-CoV-2 by FDA under an Emergency Use Authorization (EUA). This EUA will remain in effect (meaning this test can be used) for the duration of the COVID-19 declaration under Section 564(b)(1) of the Act, 21 U.S.C. section 360bbb-3(b)(1),  unless the authorization is terminated or revoked.  Performed at [facility], [address] [place], [place] [address]   Culture, blood (single)     Status: None (Preliminary result)   Collection Time: 03/03/21  3:10 PM   Specimen: BLOOD  Result Value  Ref Range Status   Specimen Description BLOOD BLOOD RIGHT FOREARM  Final   Special Requests   Final    BOTTLES DRAWN AEROBIC AND ANAEROBIC Blood Culture adequate volume   Culture   Final    NO GROWTH < 24 HOURS Performed at [facility], [address] [place], [place] [address]    Report Status PENDING  Incomplete     Labs: Basic Metabolic Panel: Recent Labs  Lab 03/03/21 1330 03/03/21 1908 03/04/21 0523  NA 135  --  140  K 3.2*  --  3.3*  CL 109  --  112*  CO2 21*  --  19*  GLUCOSE 101*  --  135*  BUN 21  --  15  CREATININE 1.22* 1.09* 0.99  CALCIUM 7.8*  --  8.4*   Liver Function Tests: Recent Labs  Lab 03/03/21 1330 03/04/21 0523  AST 24 25  ALT 13 15  ALKPHOS 109 124  BILITOT 1.1 0.7  PROT 5.7* 6.4*  ALBUMIN 2.7* 3.0*   No results for input(s): LIPASE, AMYLASE in the last 168 hours. No results for input(s): AMMONIA in the last 168 hours. CBC: Recent Labs  Lab 03/03/21 1330 03/04/21 0523  WBC 10.7* 13.1*  NEUTROABS  --  12.3*  HGB 10.0* 11.4*  HCT 29.0* 33.4*  MCV 86.8 86.1  PLT 227 281   Cardiac Enzymes: No results for input(s): CKTOTAL, CKMB, CKMBINDEX, TROPONINI in the last 168 hours. BNP: BNP (last 3 results) Recent Labs    03/03/21 1330  BNP 134.3*    ProBNP (last 3 results) No results for input(s): PROBNP in the last 8760 hours.  CBG: No results for input(s): GLUCAP in the last 168 hours.     Signed:  Fayrene Helper MD.  [facility] 03/04/2021, 2:26 PM

## 2021-03-04 NOTE — Progress Notes (Signed)
Telemetry order discontinued. Telemetry box returned to tele holder in med room.

## 2021-03-04 NOTE — Care Management Obs Status (Signed)
[place] NOTIFICATION   Patient Details  Name: Andrea [name] MRN: [id number] Date of Birth: 12/28/1950   Medicare Observation Status Notification Given:  Yes    Beverly Sessions, RN 03/04/2021, 3:11 PM

## 2021-03-04 NOTE — Evaluation (Signed)
Physical Therapy Evaluation Patient Details Name: Andrea [name] MRN: [id number] DOB: [birth year] Today's Date: 03/04/2021  History of Present Illness  per EMR pt is a 70 yr old woman who was brought to the [facility] ED on 03/03/2021 by her husband due to worsening shortness of breath which has been accompanied by confusion. She was lethargic upon presentation in the ED. Chest x-ray is consistent with pneumonia,  PMH  significant for chronic constipation, GERD, Hyperlipidemia, Hypertension, Post-Polio Syndrome, Parkinsons, PVD, Vitamin D Def   Clinical Impression  Pt admitted with above diagnosis. Pt received supine in bed agreeable to PT with husband present at bed side. Reports being mod-I with all mobility with ADL's/IADL's. Intermittent need for standard walker to ambulate when days she feels less steady. Pt resting on 2 L/min satting at 98%. Able to stand and amb with supervision 43' with RW. Amb with circumductory gait on LLE with KAFO donned due to hx of post polio. Pt overall steady throughout with RW with no sway or LOB. Trialed RA with 30' return to room. Maintained SPO2 > 90% throughout denying SOB or dizziness. Amb from room to bathroom on RA with use of IV pole with supervision. Pt given privacy in bathroom and care handed off to OT. Besides reports of mild weakness in LE's per pt report, pt near baseline function. RN notified pt maintaining sats on RA > 90% and current mobility status. No follow up PT recommended. Pt currently with functional limitations due to the deficits listed below (see PT Problem List). Pt will benefit from skilled PT to increase their independence and safety with mobility to allow discharge to the venue listed below.    Recommendations for follow up therapy are one component of a multi-disciplinary discharge planning process, led by the attending physician.  Recommendations may be updated based on patient status, additional functional criteria and insurance  authorization.  Follow Up Recommendations No PT follow up    Assistance Recommended at Discharge Set up Supervision/Assistance  Functional Status Assessment Patient has had a recent decline in their functional status and demonstrates the ability to make significant improvements in function in a reasonable and predictable amount of time.  Equipment Recommendations       Recommendations for Other Services       Precautions / Restrictions Precautions Precautions: Fall Restrictions Weight Bearing Restrictions: No Other Position/Activity Restrictions: Pt has L KAFO due to post-polio      Mobility  Bed Mobility Overal bed mobility: Modified Independent               Patient Response: Cooperative  Transfers Overall transfer level: Needs assistance Equipment used: Rolling walker (2 wheels) Transfers: Sit to/from Stand Sit to Stand: Supervision Stand pivot transfers: Supervision              Ambulation/Gait Ambulation/Gait assistance: Supervision Gait Distance (Feet): 60 Feet Assistive device: Rolling walker (2 wheels) Gait Pattern/deviations: Step-to pattern;Decreased stance time - right     General Gait Details: Circumductory gait noted on LLE due to hx of post polio. Use of RW in hallway. Amb with IV pole in room to bathroom.  Stairs            Wheelchair Mobility    Modified Rankin (Stroke Patients Only)       Balance Overall balance assessment: Needs assistance Sitting-balance support: No upper extremity supported;Feet supported Sitting balance-Leahy Scale: Good       Standing balance-Leahy Scale: Fair Standing balance comment: Requires support of IV pole  or RW to maintain balance                             Pertinent Vitals/Pain Pain Assessment: No/denies pain    Home Living Family/patient expects to be discharged to:: Private residence Living Arrangements: Spouse/significant other Available Help at Discharge:  Family;Available 24 hours/day Type of Home: House Home Access: Ramped entrance       Home Layout: One level Home Equipment: Conservation officer, nature (2 wheels);Cane - single point;BSC;Shower seat;Grab bars - tub/shower;Toilet riser;Wheelchair - power      Prior Function Prior Level of Function : Independent/Modified Independent             Mobility Comments: MOD I- Indep without AD ADLs Comments: MOD I-Indep with all ADL/IADL, cooking, cleaning, laundry     Hand Dominance        Extremity/Trunk Assessment   Upper Extremity Assessment Upper Extremity Assessment: Overall WFL for tasks assessed    Lower Extremity Assessment Lower Extremity Assessment: Generalized weakness;LLE deficits/detail LLE Deficits / Details: due to post polio    Cervical / Trunk Assessment Cervical / Trunk Assessment: Kyphotic  Communication   Communication: No difficulties  Cognition Arousal/Alertness: Awake/alert Behavior During Therapy: WFL for tasks assessed/performed Overall Cognitive Status: Within Functional Limits for tasks assessed                                 General Comments: AMb 30' on RA with SPO2 at 93% with RW        General Comments General comments (skin integrity, edema, etc.): Reports use of L knee brace for ambulation    Exercises Other Exercises Other Exercises: Role of PT in acute setting, d/c recs   Assessment/Plan    PT Assessment Patient needs continued PT services  PT Problem List Decreased strength;Decreased range of motion;Decreased activity tolerance;Decreased knowledge of use of DME;Decreased balance       PT Treatment Interventions Therapeutic exercise;DME instruction;Gait training;Balance training;Stair training;Neuromuscular re-education;Functional mobility training;Therapeutic activities;Patient/family education    PT Goals (Current goals can be found in the Care Plan section)  Acute Rehab PT Goals Patient Stated Goal: get home PT Goal  Formulation: With patient/family Time For Goal Achievement: 03/18/21 Potential to Achieve Goals: Good    Frequency Min 2X/week   Barriers to discharge        Co-evaluation               AM-PAC PT "6 Clicks" Mobility  Outcome Measure Help needed turning from your back to your side while in a flat bed without using bedrails?: A Little Help needed moving from lying on your back to sitting on the side of a flat bed without using bedrails?: A Little Help needed moving to and from a bed to a chair (including a wheelchair)?: A Little Help needed standing up from a chair using your arms (e.g., wheelchair or bedside chair)?: A Little Help needed to walk in hospital room?: A Little Help needed climbing 3-5 steps with a railing? : A Lot 6 Click Score: 17    End of Session Equipment Utilized During Treatment: Gait belt;Oxygen Activity Tolerance: Patient tolerated treatment well Patient left: Other (comment);with family/visitor present (in bathroom in care of OT) Nurse Communication: Mobility status PT Visit Diagnosis: Other abnormalities of gait and mobility (R26.89);Muscle weakness (generalized) (M62.81)    Time: 3734-2876 PT Time Calculation (min) (ACUTE ONLY): 20  min   Charges:   PT Evaluation $PT Eval Low Complexity: 1 Low PT Treatments $Gait Training: 8-22 mins      Salem Caster. Fairly IV, PT, DPT Physical Therapist- [facility]  03/04/2021, 12:09 PM

## 2021-03-04 NOTE — Evaluation (Signed)
Occupational Therapy Evaluation Patient Details Name: Andrea [name] MRN: [id number] DOB: [birth year] Today's Date: 03/04/2021   History of Present Illness per EMR pt is a 70 yr old woman who was brought to the [facility] ED on 03/03/2021 by her husband due to worsening shortness of breath which has been accompanied by confusion. She was lethargic upon presentation in the ED. Chest x-ray is consistent with pneumonia,  PMH  significant for chronic constipation, GERD, Hyperlipidemia, Hypertension, Post-Polio Syndrome, Parkinsons, PVD, Vitamin D Def   Clinical Impression   Chart reviewed, RN cleared pt for participation in OT evaluation. Pt finishing with PT prior to session starting. Pt agreeable to session, alert and oriented throughout. Pt will have assistance from husband as needed following discharge. Pt is able to complete all ADL with MOD I with education re: energy conservation. Good carry over and demo of skills reviewed. Pt is left in bedside chair, NAD, all needs met. Of note, pt SPO2 95-98 on RA throughout evaluation.  No further OT needed at this time. Please re-consult if there is a change in functional status.      Recommendations for follow up therapy are one component of a multi-disciplinary discharge planning process, led by the attending physician.  Recommendations may be updated based on patient status, additional functional criteria and insurance authorization.   Follow Up Recommendations  No OT follow up    Assistance Recommended at Discharge None  Functional Status Assessment  Patient has had a recent decline in their functional status and demonstrates the ability to make significant improvements in function in a reasonable and predictable amount of time.  Equipment Recommendations   (pt has all equipment recommended)    Recommendations for Other Services       Precautions / Restrictions Precautions Precautions: Fall Restrictions Weight Bearing Restrictions: No Other  Position/Activity Restrictions: Pt has L KAFO due to post-polio      Mobility Bed Mobility Overal bed mobility: Modified Independent                  Transfers Overall transfer level: Needs assistance Equipment used: Rolling walker (2 wheels) Transfers: Sit to/from Stand Sit to Stand: Supervision Stand pivot transfers: Supervision                Balance Overall balance assessment: Needs assistance Sitting-balance support: No upper extremity supported;Feet supported Sitting balance-Leahy Scale: Good       Standing balance-Leahy Scale: Fair Standing balance comment: Requires support of IV pole or RW to maintain balance                           ADL either performed or assessed with clinical judgement   ADL Overall ADL's : Modified independent                                       General ADL Comments: pt performs all ADL in seated at EOB (UB/LB bathing, UB/LB dressing (including KAFO); grooming tasks in seated and standing (using energy conservation techniques), toileting with Distant SUP     Vision Baseline Vision/History: 1 Wears glasses       Perception     Praxis      Pertinent Vitals/Pain Pain Assessment: No/denies pain     Hand Dominance     Extremity/Trunk Assessment Upper Extremity Assessment Upper Extremity Assessment: Overall WFL for tasks assessed  Lower Extremity Assessment Lower Extremity Assessment: Generalized weakness;LLE deficits/detail LLE Deficits / Details: due to post polio   Cervical / Trunk Assessment Cervical / Trunk Assessment: Kyphotic   Communication Communication Communication: No difficulties   Cognition Arousal/Alertness: Awake/alert Behavior During Therapy: WFL for tasks assessed/performed Overall Cognitive Status: Within Functional Limits for tasks assessed                                 General Comments: AMb 30' on RA with SPO2 at 93% with RW     General  Comments  Reports use of L knee brace for ambulation    Exercises Other Exercises Other Exercises: Role of PT in acute setting, d/c recs   Shoulder Instructions      Home Living Family/patient expects to be discharged to:: Private residence Living Arrangements: Spouse/significant other Available Help at Discharge: Family;Available 24 hours/day Type of Home: House Home Access: Ramped entrance     Home Layout: One level     Bathroom Shower/Tub: Teacher, early years/pre: Handicapped height Bathroom Accessibility: Yes   Home Equipment: Conservation officer, nature (2 wheels);Cane - single point;BSC;Shower seat;Grab bars - tub/shower;Toilet riser;Wheelchair - power          Prior Functioning/Environment Prior Level of Function : Independent/Modified Independent             Mobility Comments: MOD I- Indep without AD ADLs Comments: MOD I-Indep with all ADL/IADL, cooking, cleaning, laundry        OT Problem List: Decreased activity tolerance      OT Treatment/Interventions:      OT Goals(Current goals can be found in the care plan section) Acute Rehab OT Goals Patient Stated Goal: to go home OT Goal Formulation: With patient Time For Goal Achievement: 03/18/21 Potential to Achieve Goals: Good  OT Frequency:     Barriers to D/C:            Co-evaluation              AM-PAC OT "6 Clicks" Daily Activity     Outcome Measure Help from another person eating meals?: None Help from another person taking care of personal grooming?: None Help from another person toileting, which includes using toliet, bedpan, or urinal?: None Help from another person bathing (including washing, rinsing, drying)?: None Help from another person to put on and taking off regular upper body clothing?: None Help from another person to put on and taking off regular lower body clothing?: None 6 Click Score: 24   End of Session Equipment Utilized During Treatment: Gait belt;Rolling walker  (2 wheels) Nurse Communication: Mobility status  Activity Tolerance: Patient tolerated treatment well Patient left: in chair;with call bell/phone within reach;with chair alarm set  OT Visit Diagnosis: Unsteadiness on feet (R26.81);Other abnormalities of gait and mobility (R26.89);Repeated falls (R29.6)                Time: 4010-2725 OT Time Calculation (min): 34 min Charges:  OT General Charges $OT Visit: 1 Visit OT Evaluation $OT Eval Low Complexity: 1 Low OT Treatments $Self Care/Home Management : 8-22 mins  Shanon Payor, OTD OTR/L  03/04/21, 12:43 PM

## 2021-03-04 NOTE — Progress Notes (Signed)
PHARMACY - PHYSICIAN COMMUNICATION CRITICAL VALUE ALERT - BLOOD CULTURE IDENTIFICATION (BCID)  GENESI STEFANKO is an 70 y.o. female who presented to [facility] on 03/03/2021 with a chief complaint of shortness of breath.  Assessment: PMH significant for HLD, HTN, Post-Polio Sundrome, Parkinsons, and PVD. Presented hypoxic and hypotensive. Currently on Kiefer 2L, febrile overnight, and tachycardic. Chest xray c/w pneumonia. Bcx growing GPC 1/4. BCID detected MSSE.  Name of physician (or Provider) Contacted: Passed off from Lloyd Huger, [place] who contacted [facility] Cephus Slater, MD.  Current antibiotics: ceftriaxone 2 grams every 24 hours and azithromycin 500 mg every 24 hours  Changes to prescribed antibiotics recommended: Possible contaminant. Regardless, recommend continuing ceftriaxone and azithromycin for inpatient CAP coverage. Repeat blood cultures ordered. Patient is on recommended antibiotics - No changes needed  Results for orders placed or performed during the hospital encounter of 03/03/21  Blood Culture ID Panel (Reflexed) (Collected: 03/03/2021  1:30 PM)  Result Value Ref Range   Enterococcus faecalis NOT DETECTED NOT DETECTED   Enterococcus Faecium NOT DETECTED NOT DETECTED   Listeria monocytogenes NOT DETECTED NOT DETECTED   Staphylococcus species DETECTED (A) NOT DETECTED   Staphylococcus aureus (BCID) NOT DETECTED NOT DETECTED   Staphylococcus epidermidis DETECTED (A) NOT DETECTED   Staphylococcus lugdunensis NOT DETECTED NOT DETECTED   Streptococcus species NOT DETECTED NOT DETECTED   Streptococcus agalactiae NOT DETECTED NOT DETECTED   Streptococcus pneumoniae NOT DETECTED NOT DETECTED   Streptococcus pyogenes NOT DETECTED NOT DETECTED   A.calcoaceticus-baumannii NOT DETECTED NOT DETECTED   Bacteroides fragilis NOT DETECTED NOT DETECTED   Enterobacterales NOT DETECTED NOT DETECTED   Enterobacter cloacae complex NOT DETECTED NOT DETECTED   Escherichia coli NOT DETECTED NOT DETECTED    Klebsiella aerogenes NOT DETECTED NOT DETECTED   Klebsiella oxytoca NOT DETECTED NOT DETECTED   Klebsiella pneumoniae NOT DETECTED NOT DETECTED   Proteus species NOT DETECTED NOT DETECTED   Salmonella species NOT DETECTED NOT DETECTED   Serratia marcescens NOT DETECTED NOT DETECTED   Haemophilus influenzae NOT DETECTED NOT DETECTED   Neisseria meningitidis NOT DETECTED NOT DETECTED   Pseudomonas aeruginosa NOT DETECTED NOT DETECTED   Stenotrophomonas maltophilia NOT DETECTED NOT DETECTED   Candida albicans NOT DETECTED NOT DETECTED   Candida auris NOT DETECTED NOT DETECTED   Candida glabrata NOT DETECTED NOT DETECTED   Candida krusei NOT DETECTED NOT DETECTED   Candida parapsilosis NOT DETECTED NOT DETECTED   Candida tropicalis NOT DETECTED NOT DETECTED   Cryptococcus neoformans/gattii NOT DETECTED NOT DETECTED   Methicillin resistance mecA/C NOT DETECTED NOT DETECTED    Wynelle Cleveland, PharmD Pharmacy Resident  03/04/2021 8:00 AM

## 2021-03-06 LAB — CULTURE, BLOOD (SINGLE): Special Requests: ADEQUATE

## 2021-03-07 LAB — CULTURE, BLOOD (SINGLE): Special Requests: ADEQUATE

## 2021-03-09 LAB — CULTURE, BLOOD (ROUTINE X 2)
Culture: NO GROWTH
Culture: NO GROWTH
Special Requests: ADEQUATE
Special Requests: ADEQUATE

## 2021-06-24 DIAGNOSIS — Z9011 Acquired absence of right breast and nipple: Secondary | ICD-10-CM | POA: Insufficient documentation

## 2021-07-01 ENCOUNTER — Other Ambulatory Visit: Payer: Self-pay | Admitting: Nurse Practitioner

## 2021-07-01 DIAGNOSIS — K219 Gastro-esophageal reflux disease without esophagitis: Secondary | ICD-10-CM

## 2021-07-01 DIAGNOSIS — R1110 Vomiting, unspecified: Secondary | ICD-10-CM

## 2021-07-01 DIAGNOSIS — R1310 Dysphagia, unspecified: Secondary | ICD-10-CM

## 2021-07-08 ENCOUNTER — Ambulatory Visit
Admission: RE | Admit: 2021-07-08 | Discharge: 2021-07-08 | Disposition: A | Discharge status: Home / Self Care | Payer: Medicare Other | Source: Ambulatory Visit | Attending: Nurse Practitioner | Admitting: Nurse Practitioner

## 2021-07-08 ENCOUNTER — Other Ambulatory Visit: Payer: Self-pay

## 2021-07-08 DIAGNOSIS — R1310 Dysphagia, unspecified: Secondary | ICD-10-CM | POA: Diagnosis present

## 2021-07-08 DIAGNOSIS — K219 Gastro-esophageal reflux disease without esophagitis: Secondary | ICD-10-CM | POA: Diagnosis not present

## 2021-07-08 DIAGNOSIS — R1110 Vomiting, unspecified: Secondary | ICD-10-CM | POA: Insufficient documentation

## 2021-07-27 ENCOUNTER — Other Ambulatory Visit: Payer: Self-pay | Admitting: General Surgery

## 2021-07-27 DIAGNOSIS — Z1231 Encounter for screening mammogram for malignant neoplasm of breast: Secondary | ICD-10-CM

## 2021-07-27 DIAGNOSIS — Z853 Personal history of malignant neoplasm of breast: Secondary | ICD-10-CM

## 2021-07-28 ENCOUNTER — Other Ambulatory Visit: Payer: Self-pay

## 2021-07-28 ENCOUNTER — Ambulatory Visit (INDEPENDENT_AMBULATORY_CARE_PROVIDER_SITE_OTHER): Payer: Medicare Other | Admitting: Podiatry

## 2021-07-28 ENCOUNTER — Ambulatory Visit (INDEPENDENT_AMBULATORY_CARE_PROVIDER_SITE_OTHER): Payer: Medicare Other

## 2021-07-28 ENCOUNTER — Encounter: Payer: Self-pay | Admitting: Podiatry

## 2021-07-28 DIAGNOSIS — M2042 Other hammer toe(s) (acquired), left foot: Secondary | ICD-10-CM

## 2021-07-28 DIAGNOSIS — M2041 Other hammer toe(s) (acquired), right foot: Secondary | ICD-10-CM

## 2021-07-28 DIAGNOSIS — G4700 Insomnia, unspecified: Secondary | ICD-10-CM | POA: Insufficient documentation

## 2021-07-28 DIAGNOSIS — I10 Essential (primary) hypertension: Secondary | ICD-10-CM | POA: Insufficient documentation

## 2021-07-28 NOTE — Progress Notes (Signed)
? ?HPI: 71 y.o. female presenting today with her husband for evaluation of bilateral foot complaints. ?LT foot: Patient has a history of polio affecting the left lower extremity ever since she was a child.  She wears a left lower extremity AFO with a hinged knee brace covering the entire lower extremity.  The patient has a longstanding history of crossover deformity of the second digit overlapping the great toe.  Patient states it has been very cumbersome and irritating for years.  She has tried different AFO bracing, shoes, and splinting to help alleviate her symptoms but they have all failed.  She is very frustrated and would like the toe to be removed.  Her husband has a history of toe amputation and she states that he did very well and she would like to have the same performed to her left second toe since it is constantly irritating and affecting her on a daily basis. 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  Patient states that although it is not as irritating as her overlap to the second digit of the left foot, and she continues to have tenderness associated to the hammertoe deformity of the right foot.  She has tried different shoe gear modifications with minimal improvement.  They have progressively gotten worse with time.  She would also like to have them addressed if she is going to have surgery.  She presents for further treatment and evaluation ? ?Past Medical History:  ?Diagnosis Date  ? Breast cancer ([place]) 2008  ? RT LUMPECTOMY  ? Breast cancer [facility]) 2011  ? RT MASTECTOMY  ? Cancer [facility]) [id number]   ?  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 Cervical post-laminectomy syndrome   ? Chronic constipation   ? Complication of anesthesia   ? itching real bad or msucles spasms  ? Cubital tunnel syndrome   ? Depression   ? Disorders of sacrum   ? Disturbance of skin sensation   ? GERD (gastroesophageal reflux disease)   ? History of  colon polyps   ? Hot flashes   ? Hx of diplopia   ? right eye  ? Hyperlipidemia   ? Hypertension   ? Insomnia   ? Late effects of acute poliomyelitis   ? Lateral epicondylitis  of elbow   ? Loss of memory   ? Menopausal state   ? Osteoporosis   ? Pain in joint of left shoulder   ? Parkinson's disease ([place]) 2011  ? Peripheral vascular disease ([place]) 2011  ? venous stasis   ? Personal history of radiation therapy   ? Radiation 2008  ? BREAST CA  ? Tremor   ? Unspecified musculoskeletal disorders and symptoms referable to neck   ? cervical/trapezius  ? Vitamin D deficiency   ? ? ?Past Surgical History:  ?Procedure Laterality Date  ? ABDOMINAL HYSTERECTOMY  1997  ? ANTERIOR CERVICAL DECOMP/DISCECTOMY FUSION N/A 02/01/2019  ? Procedure: Anterior Cervical Decompression Fusion Cervical three-four, Cervical four-five, hardware removal Cervical five-six;  Surgeon: Earnie Larsson, MD;  Location: [place];  Service: Neurosurgery;  Laterality: N/A;  ? BREAST LUMPECTOMY Right 2008  ? 4 cm area of DCIS, margins less than 1 mm. Treated with wide excision, whole breast radiation  ? BREAST SURGERY Right December 04, 2009  ? Right simple mastectomy, sentinel node biopsy.  ? CARPAL TUNNEL RELEASE    ? COLONOSCOPY  [id number]  ? Dr. Allen Norris, Dr. Vira Agar  ?  COLONOSCOPY    ? COLONOSCOPY WITH PROPOFOL N/A 05/23/2017  ? Procedure: COLONOSCOPY WITH PROPOFOL;  Surgeon: Manya Silvas, MD;  Location: [facility] ENDOSCOPY;  Service: Endoscopy;  Laterality: N/A;  ? ELBOW SURGERY Left 2011  ? ELBOW SURGERY Left 2015  ? ESOPHAGOGASTRODUODENOSCOPY    ? HIP SURGERY Right 06/19/12  ? LEG SURGERY Right 1958  ? Corrcetive surgery for polio  ? LEG SURGERY Left 1958  ? corrective surgery for polio  ? MASTECTOMY Right 2011  ? BREAST CA  ? rectocele/enterocelle repair and perinoplasty    ? SHOULDER ARTHROSCOPY W/ ROTATOR CUFF REPAIR Bilateral 2010,2012  ? SHOULDER ARTHROSCOPY WITH OPEN ROTATOR CUFF REPAIR Left 08/19/2015  ? Procedure: SHOULDER ARTHROSCOPY WITH  MINI OPEN  ROTATOR CUFF REPAIR,SUBACROMIAL DECOMPRESSION, ARTHROSCOPIC BICEPS TENODESIS;  Surgeon: Thornton Park, MD;  Location: [facility] ORS;  Service: Orthopedics;  Laterality: Left;  ? SHOULDER SURGERY Left 2014  ? SPINE SURGERY    ? TOE FUSION Left 1970  ? little toe fusion  ? TONSILLECTOMY    ? ? ?Allergies  ?Allergen Reactions  ? Lisinopril   ?  Raises BP  ? ?  ?Physical Exam: ?General: The patient is alert and oriented x3 in no acute distress. ? ?Dermatology: Skin is warm, dry and supple bilateral lower extremities. Negative for open lesions or macerations. ? ?Vascular: Palpable pedal pulses bilaterally. Capillary refill within normal limits.  Negative for any significant edema or erythema.  No clinical concern for vascular compromise ? ?Neurological: Light touch and protective threshold grossly intact ? ?Musculoskeletal Exam: No pedal deformities noted.  Reducible hammertoe contracture deformity noted to the digits 2, 3, 4 of the right foot secondary to excessive flexor pull of the right FDL tendons. Overlap deformity of the second digit of the left foot noted overlapping the great toe. The great toe is actually in a good rectus alignment.  ? ?Radiographic Exam:  ?Normal osseous mineralization.  Diffuse osteopenia noted.  Atrophy of the left foot noted.  No fractures identified. ? ?Assessment: ?1.  Symptomatic overlap deformity second digit LT foot ?2.  Reducible hammertoe contracture deformity 2, 3, 4 RT foot secondary to excessive flexor pull ? ? ?Plan of Care:  ?1. Patient evaluated. X-Rays reviewed.  ?2.  Today we discussed different treatment options for the patient.  The patient really would like to have the left second toe amputated.  I do believe that this would be beneficial for the patient and amputation at the level of the MTP joint would eliminate the concern for osseous healing postoperatively since this would be mostly soft tissue procedure.  In regards to the right foot, we did discuss different treatment  options both conservative and surgical.  I do believe that simple flexor tenotomy's will help alleviate pressure from the distal tips of the toes and elevate the digits which should provide relief for the patient.  The patient would like to proceed with this treatment option of the right foot.  Again, this would eliminate the concern for postoperative healing of any osseous involvement. ?3. Today we discussed the conservative versus surgical management of the presenting pathology. The patient opts for surgical management. All possible complications and details of the procedure were explained. All patient questions were answered. No guarantees were expressed or implied. ?4. Authorization for surgery was initiated today. Surgery will consist of left second toe amputation.  Flexor tenotomy 2, 3, 4 right foot. ?5.  Return to clinic 1 week postop ? ? ?  ?  ?Dorathy Daft.  Amalia Hailey, DPM ?[place] ? ?Dr. Edrick Kins, DPM  ?  ?[address]                                        ?[place], [place] [address]                ?Office [phone]  ?Fax [phone] ? ? ? ? ?

## 2021-08-04 ENCOUNTER — Telehealth: Payer: Self-pay | Admitting: *Deleted

## 2021-08-04 NOTE — Telephone Encounter (Signed)
LMTC to schedule Yearly Lung CA CT Scan. 

## 2021-08-06 ENCOUNTER — Other Ambulatory Visit: Payer: Self-pay | Admitting: *Deleted

## 2021-08-06 DIAGNOSIS — Z87891 Personal history of nicotine dependence: Secondary | ICD-10-CM

## 2021-08-06 DIAGNOSIS — Z122 Encounter for screening for malignant neoplasm of respiratory organs: Secondary | ICD-10-CM

## 2021-08-19 ENCOUNTER — Ambulatory Visit
Admission: RE | Admit: 2021-08-19 | Discharge: 2021-08-19 | Disposition: A | Discharge status: Home / Self Care | Payer: Medicare Other | Source: Ambulatory Visit | Attending: Acute Care | Admitting: Acute Care

## 2021-08-19 ENCOUNTER — Encounter: Payer: Self-pay | Admitting: Gastroenterology

## 2021-08-19 DIAGNOSIS — Z87891 Personal history of nicotine dependence: Secondary | ICD-10-CM | POA: Insufficient documentation

## 2021-08-19 DIAGNOSIS — Z122 Encounter for screening for malignant neoplasm of respiratory organs: Secondary | ICD-10-CM | POA: Insufficient documentation

## 2021-08-19 NOTE — H&P (Signed)
? ?Pre-Procedure H&P ?  ?Patient ID: Andrea [name] is a 71 y.o. female. ? ?Gastroenterology Provider: [name], DO ? ?Referring Provider: Dawson Bills, NP ?PCP: Baxter Hire, MD ? ?Date: 08/20/2021 ? ?HPI ?[name] is a 71 y.o. female who presents today for Esophagogastroduodenoscopy for Dysphagia; regurgitation; GERD. ?Patient notes food regurgitation which comes up leading her to chew it a second time and swallowing it.  This has been ongoing for 1 month.  She notes pills being stuck.  She was seen by ENT after a modified barium swallow study but did not demonstrate aspiration was referred to GI.  Modified barium swallow study demonstrated no issues with all consistencies. ? ?She later underwent an esophagram which demonstrated a cricopharyngeal bar and presbyesophagus but no stricture or mass.  Pill was held in the vallecula. ?She underwent EGD in 2015 with esophagitis and gastritis.  Negative for H. pylori and negative for EOE.  Gastritis was demonstrated as well as focal gastric intestinal metaplasia. ?Most recent lab values 11.9 hemoglobin, MCV 88 platelets 282,000 ?Status post hysterectomy ? ?Pack per day history quit in 2020.  Does take opioids and muscle relaxers. ? ?Past Medical History:  ?Diagnosis Date  ? Allergic rhinitis due to allergen   ? Allergy   ? Breast cancer ([place]) 2008  ? RT LUMPECTOMY  ? Breast cancer [facility]) 2011  ? RT MASTECTOMY  ? Cancer [facility]) [id number]   ?  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 Cervical post-laminectomy syndrome   ? Chronic constipation   ? Colon polyp 2010  ? Complication of anesthesia   ? itching real bad or msucles spasms  ? COPD (chronic obstructive pulmonary disease) ([place]) 02/21/2019  ? Cubital tunnel syndrome   ? Depression   ? Disorders of sacrum   ? Disturbance of skin sensation   ? Emphysema lung ([place]) 2020  ? GERD (gastroesophageal reflux disease)   ? Heart murmur   ? History of abnormal cervical Pap smear   ? History of  cataract   ? History of colon polyps   ? History of pneumonia 2008  ? Hot flashes   ? Hx of diplopia   ? right eye  ? Hyperlipidemia   ? Hypertension   ? Insomnia   ? Late effects of acute poliomyelitis   ? Lateral epicondylitis  of elbow   ? Loss of memory   ? Menopausal state   ? Neck pain   ? Osteoporosis   ? Pain in joint of left shoulder   ? Parkinson's disease ([place]) 2011  ? Peripheral vascular disease ([place]) 2011  ? venous stasis   ? Personal history of radiation therapy   ? PONV (postoperative nausea and vomiting)   ? Post-polio syndrome   ? Radiation 2008  ? BREAST CA  ? Sleep apnea   ? Tremor   ? Unspecified musculoskeletal disorders and symptoms referable to neck   ? cervical/trapezius  ? Vitamin D deficiency   ? ? ?Past Surgical History:  ?Procedure Laterality Date  ? ABDOMINAL HYSTERECTOMY  1997  ? ANTERIOR CERVICAL DECOMP/DISCECTOMY FUSION N/A 02/01/2019  ? Procedure: Anterior Cervical Decompression Fusion Cervical three-four, Cervical four-five, hardware removal Cervical five-six;  Surgeon: Earnie Larsson, MD;  Location: [place];  Service: Neurosurgery;  Laterality: N/A;  ? BREAST LUMPECTOMY Right 2008  ? 4 cm area of DCIS, margins less than 1 mm. Treated with wide excision, whole breast radiation  ? BREAST SURGERY Right 12/04/2009  ?  Right simple mastectomy, sentinel node biopsy.  ? CARPAL TUNNEL RELEASE    ? COLONOSCOPY  [id number]  ? Dr. Allen Norris, Dr. Vira Agar  ? COLONOSCOPY    ? COLONOSCOPY WITH PROPOFOL N/A 05/23/2017  ? Procedure: COLONOSCOPY WITH PROPOFOL;  Surgeon: Manya Silvas, MD;  Location: [facility] ENDOSCOPY;  Service: Endoscopy;  Laterality: N/A;  ? ELBOW SURGERY Left 2011  ? ELBOW SURGERY Left 2015  ? ESOPHAGOGASTRODUODENOSCOPY    ? FLEXIBLE SIGMOIDOSCOPY  07/19/2000  ? HIP SURGERY Right 06/19/2012  ? LEG SURGERY Right 1958  ? Corrcetive surgery for polio  ? LEG SURGERY Left 1958  ? corrective surgery for polio  ? MASTECTOMY Right 2011  ? BREAST CA  ? rectocele/enterocelle repair and perinoplasty     ? SHOULDER ARTHROSCOPY W/ ROTATOR CUFF REPAIR Bilateral 2010,2012  ? SHOULDER ARTHROSCOPY WITH OPEN ROTATOR CUFF REPAIR Left 08/19/2015  ? Procedure: SHOULDER ARTHROSCOPY WITH  MINI OPEN ROTATOR CUFF REPAIR,SUBACROMIAL DECOMPRESSION, ARTHROSCOPIC BICEPS TENODESIS;  Surgeon: Thornton Park, MD;  Location: [facility] ORS;  Service: Orthopedics;  Laterality: Left;  ? SHOULDER SURGERY Left 2014  ? SPINE SURGERY    ? TOE FUSION Left 1970  ? little toe fusion  ? TONSILLECTOMY    ? TUBAL LIGATION    ? two left foot surgeries:polio related    ? ? ?Family History ?No h/o GI disease or malignancy ? ?Review of Systems  ?Constitutional:  Negative for activity change, appetite change, chills, diaphoresis, fatigue, fever and unexpected weight change.  ?HENT:  Positive for trouble swallowing (Regurgitation). Negative for voice change.   ?Respiratory:  Negative for shortness of breath and wheezing.   ?Cardiovascular:  Negative for chest pain, palpitations and leg swelling.  ?Gastrointestinal:  Negative for abdominal distention, abdominal pain, anal bleeding, blood in stool, constipation, diarrhea, nausea, rectal pain and vomiting.  ?     Positive for reflux  ?Musculoskeletal:  Negative for arthralgias and myalgias.  ?Skin:  Negative for color change and pallor.  ?Neurological:  Negative for dizziness, syncope and weakness.  ?Psychiatric/Behavioral:  Negative for confusion.   ?All other systems reviewed and are negative.  ? ?Medications ?No current facility-administered medications on file prior to encounter.  ? ?Current Outpatient Medications on File Prior to Encounter  ?Medication Sig Dispense Refill  ? albuterol (VENTOLIN HFA) 108 (90 Base) MCG/ACT inhaler Inhale 2 puffs into the lungs every 6 (six) hours as needed for wheezing or shortness of breath. 8 g 2  ? amLODipine (NORVASC) 2.5 MG tablet Take 2.5 mg by mouth daily.    ? ANORO ELLIPTA 62.5-25 MCG/ACT AEPB Inhale 1 puff into the lungs daily.    ? atorvastatin (LIPITOR) 80 MG  tablet Take 80 mg by mouth daily.    ? calcium carbonate (TUMS EX) 750 MG chewable tablet Chew 1 tablet by mouth daily.    ? Cholecalciferol (D3 VITAMIN PO) Take by mouth daily.    ? diphenhydrAMINE (BENADRYL) 50 MG tablet Take 50 mg by mouth at bedtime as needed for itching.    ? estrogens, conjugated, (PREMARIN) 0.3 MG tablet Take 0.3 mg by mouth daily. Take daily for 21 days then do not take for 7 days.    ? Green Coffee Bean-Yerba Mate (GREEN COFFEE BEAN EXTRACT PO) Take 400 mg by mouth.    ? HYDROcodone-acetaminophen (NORCO) 10-325 MG tablet Take 1 tablet by mouth every 6 (six) hours as needed for moderate pain.     ? losartan (COZAAR) 25 MG tablet Take 25 mg by mouth daily.    ?  Multiple Vitamins-Minerals (PA WOMENS 50 PLUS VITAPAK PO) Take by mouth daily.    ? omeprazole (PRILOSEC) 40 MG capsule Take 40 mg by mouth daily.    ? polyethylene glycol (MIRALAX / GLYCOLAX) packet Take 17 g by mouth daily as needed for moderate constipation.     ? pregabalin (LYRICA) 100 MG capsule Take 100 mg by mouth 3 (three) times daily.     ? tiZANidine (ZANAFLEX) 4 MG tablet Take 4 mg by mouth 4 (four) times daily as needed for muscle spasms.     ? traZODone (DESYREL) 50 MG tablet Take 50 mg by mouth at bedtime as needed for sleep.     ? triamcinolone (NASACORT) 55 MCG/ACT AERO nasal inhaler Place 2 sprays into the nose daily as needed (allergies).    ? zoledronic acid (RECLAST) 5 MG/100ML SOLN injection Inject 5 mg into the vein once.    ? meloxicam (MOBIC) 7.5 MG tablet Take 7.5 mg by mouth 2 (two) times daily.     ? ? ?Pertinent medications related to GI and procedure were reviewed by me with the patient prior to the procedure ? ? ?Current Facility-Administered Medications:  ?  0.9 %  sodium chloride infusion, , Intravenous, Continuous, [name], DO, Last Rate: 20 mL/hr at 08/20/21 1033, Continued from Pre-op at 08/20/21 1033 ?  ?  ? ?Allergies  ?Allergen Reactions  ? Lisinopril   ?  Raises BP  ? ?Allergies  were reviewed by me prior to the procedure ? ?Objective  ? ?Body mass index is 22.46 kg/m?. ?Vitals:  ? 08/20/21 0955  ?BP: (!) 193/90  ?Pulse: 71  ?Resp: 20  ?Temp: (!) 96.7 ?F (35.9 ?C)  ?TempSrc: Tempor

## 2021-08-20 ENCOUNTER — Ambulatory Visit: Payer: Medicare Other | Admitting: Anesthesiology

## 2021-08-20 ENCOUNTER — Encounter: Payer: Self-pay | Admitting: Gastroenterology

## 2021-08-20 ENCOUNTER — Ambulatory Visit
Admission: RE | Admit: 2021-08-20 | Discharge: 2021-08-20 | Disposition: A | Discharge status: Home / Self Care | Payer: Medicare Other | Attending: Gastroenterology | Admitting: Gastroenterology

## 2021-08-20 ENCOUNTER — Encounter
Admission: RE | Disposition: A | Discharge status: Home / Self Care | Payer: Self-pay | Source: Home / Self Care | Attending: Gastroenterology

## 2021-08-20 DIAGNOSIS — K2100 Gastro-esophageal reflux disease with esophagitis, without bleeding: Secondary | ICD-10-CM | POA: Diagnosis not present

## 2021-08-20 DIAGNOSIS — K2970 Gastritis, unspecified, without bleeding: Secondary | ICD-10-CM | POA: Diagnosis not present

## 2021-08-20 DIAGNOSIS — M1990 Unspecified osteoarthritis, unspecified site: Secondary | ICD-10-CM | POA: Diagnosis not present

## 2021-08-20 DIAGNOSIS — I10 Essential (primary) hypertension: Secondary | ICD-10-CM | POA: Insufficient documentation

## 2021-08-20 DIAGNOSIS — I739 Peripheral vascular disease, unspecified: Secondary | ICD-10-CM | POA: Diagnosis not present

## 2021-08-20 DIAGNOSIS — R1310 Dysphagia, unspecified: Secondary | ICD-10-CM | POA: Insufficient documentation

## 2021-08-20 DIAGNOSIS — K2289 Other specified disease of esophagus: Secondary | ICD-10-CM | POA: Diagnosis not present

## 2021-08-20 DIAGNOSIS — K222 Esophageal obstruction: Secondary | ICD-10-CM | POA: Insufficient documentation

## 2021-08-20 HISTORY — DX: Personal history of other diseases of the female genital tract: Z87.42

## 2021-08-20 HISTORY — DX: Other specified postprocedural states: Z98.890

## 2021-08-20 HISTORY — DX: Allergy, unspecified, initial encounter: T78.40XA

## 2021-08-20 HISTORY — DX: Sleep apnea, unspecified: G47.30

## 2021-08-20 HISTORY — DX: Personal history of other diseases of the nervous system and sense organs: Z86.69

## 2021-08-20 HISTORY — DX: Cardiac murmur, unspecified: R01.1

## 2021-08-20 HISTORY — DX: Other specified postprocedural states: R11.2

## 2021-08-20 HISTORY — PX: ESOPHAGOGASTRODUODENOSCOPY: SHX5428

## 2021-08-20 HISTORY — DX: Postpolio syndrome: G14

## 2021-08-20 HISTORY — DX: Allergic rhinitis, unspecified: J30.9

## 2021-08-20 HISTORY — DX: Cervicalgia: M54.2

## 2021-08-20 SURGERY — EGD (ESOPHAGOGASTRODUODENOSCOPY)
Anesthesia: General

## 2021-08-20 MED ORDER — PROPOFOL 10 MG/ML IV BOLUS
INTRAVENOUS | Status: AC
  Filled 2021-08-20: qty 40

## 2021-08-20 MED ORDER — PROPOFOL 10 MG/ML IV BOLUS
INTRAVENOUS | Status: DC | PRN
  Administered 2021-08-20: 40 mg via INTRAVENOUS
  Administered 2021-08-20: 80 mg via INTRAVENOUS
  Administered 2021-08-20 (×2): 20 mg via INTRAVENOUS

## 2021-08-20 MED ORDER — SODIUM CHLORIDE 0.9 % IV SOLN
INTRAVENOUS | Status: DC
  Administered 2021-08-20: 20 mL/h via INTRAVENOUS

## 2021-08-20 MED ORDER — LIDOCAINE HCL (CARDIAC) PF 100 MG/5ML IV SOSY
PREFILLED_SYRINGE | INTRAVENOUS | Status: DC | PRN
Start: 2021-08-20 — End: 2021-08-20
  Administered 2021-08-20: 80 mg via INTRAVENOUS

## 2021-08-20 NOTE — Op Note (Signed)
[facility] ?Gastroenterology ?Patient Name: Andrea [name] ?Procedure Date: 08/20/2021 10:45 AM ?MRN: [id number] ?Account #: [id number] ?Date of Birth: 12/10/1950 ?Admit Type: Outpatient ?Age: 71 ?Room: [facility] ENDO ROOM 1 ?Gender: Female ?Note Status: Finalized ?Instrument Name: Upper Endoscope [id number] ?Procedure:             Upper GI endoscopy ?Indications:           Dysphagia ?Providers:             [name] DO, DO ?Referring MD:          Andres Labrum, MD (Referring MD) ?Medicines:             Monitored Anesthesia Care ?Complications:         No immediate complications. Estimated blood loss:  ?                       Minimal. ?Procedure:             Pre-Anesthesia Assessment: ?                       - Prior to the procedure, a History and Physical was  ?                       performed, and patient medications and allergies were  ?                       reviewed. The patient is competent. The risks and  ?                       benefits of the procedure and the sedation options and  ?                       risks were discussed with the patient. All questions  ?                       were answered and informed consent was obtained.  ?                       Patient identification and proposed procedure were  ?                       verified by the physician, the nurse, the anesthetist  ?                       and the technician in the endoscopy suite. Mental  ?                       Status Examination: alert and oriented. Airway  ?                       Examination: normal oropharyngeal airway and neck  ?                       mobility. Respiratory Examination: clear to  ?                       auscultation. CV Examination: RRR, no murmurs, no S3  ?  or S4. Prophylactic Antibiotics: The patient does not  ?                       require prophylactic antibiotics. Prior  ?                       Anticoagulants: The patient has taken no previous  ?                        anticoagulant or antiplatelet agents. ASA Grade  ?                       Assessment: II - A patient with mild systemic disease.  ?                       After reviewing the risks and benefits, the patient  ?                       was deemed in satisfactory condition to undergo the  ?                       procedure. The anesthesia plan was to use monitored  ?                       anesthesia care (MAC). Immediately prior to  ?                       administration of medications, the patient was  ?                       re-assessed for adequacy to receive sedatives. The  ?                       heart rate, respiratory rate, oxygen saturations,  ?                       blood pressure, adequacy of pulmonary ventilation, and  ?                       response to care were monitored throughout the  ?                       procedure. The physical status of the patient was  ?                       re-assessed after the procedure. ?                       After obtaining informed consent, the endoscope was  ?                       passed under direct vision. Throughout the procedure,  ?                       the patient's blood pressure, pulse, and oxygen  ?                       saturations were monitored continuously. The Endoscope  ?  was introduced through the mouth, and advanced to the  ?                       second part of duodenum. The upper GI endoscopy was  ?                       accomplished without difficulty. The patient tolerated  ?                       the procedure well. ?Findings: ?     The duodenal bulb, first portion of the duodenum and second portion of  ?     the duodenum were normal. Estimated blood loss: none. ?     Diffuse moderate inflammation characterized by congestion (edema) and  ?     erythema was found in the entire examined stomach. Biopsies were taken  ?     with a cold forceps for histology. Patient with history of gastric  ?     intestinal metaplasia. Biopsies from the  antrum/incisura and gastric  ?     body were placed in separate jars. Estimated blood loss was minimal. ?     The Z-line was variable. <1cm; no biopsies taken Estimated blood loss:  ?     none. ?     Normal mucosa was found in the entire esophagus. Biopsies were taken  ?     with a cold forceps for histology. Estimated blood loss was minimal. ?     Esophagogastric landmarks were identified: the gastroesophageal junction  ?     was found at 36 cm from the incisors. ?     A Cricopharyngeal bar area of extrinsic compression was found at the  ?     cricopharyngeus. The scope was withdrawn. Dilation was performed with a  ?     Maloney dilator with no resistance at 54 Fr. The dilation site was  ?     examined following endoscope reinsertion and showed mild mucosal  ?     disruption. Estimated blood loss was minimal. ?     The exam was otherwise without abnormality. ?Impression:            - Normal duodenal bulb, first portion of the duodenum  ?                       and second portion of the duodenum. ?                       - Gastritis. Biopsied. ?                       - Z-line variable. ?                       - Normal mucosa was found in the entire esophagus.  ?                       Biopsied. ?                       - Esophagogastric landmarks identified. ?                       - Extrinsic compression at the cricopharyngeus.  ?  Dilated. ?                       - The examination was otherwise normal. ?Recommendation:        - Discharge patient to home. ?                       - Mechanical soft diet today. ?                       - Advance diet as tolerated. ?                       - Continue present medications. ?                       - No aspirin, ibuprofen, naproxen, or other  ?                       non-steroidal anti-inflammatory drugs for 5 days. ?                       - Await pathology results. ?                       - Repeat upper endoscopy for surveillance. ?                       -  Return to GI office as previously scheduled. ?                       - The findings and recommendations were discussed with  ?                       the patient. ?Procedure Code(s):     --- Professional --- ?                       [phone], Esophagogastroduodenoscopy, flexible,  ?                       transoral; with biopsy, single or multiple ?                       43450, Dilation of esophagus, by unguided sound or  ?                       bougie, single or multiple passes ?Diagnosis Code(s):     --- Professional --- ?                       K29.70, Gastritis, unspecified, without bleeding ?                       K22.8, Other specified diseases of esophagus ?                       K22.2, Esophageal obstruction ?                       R13.10, Dysphagia, unspecified ?CPT copyright 2019 American Medical Association. All rights reserved. ?The codes documented in this report are preliminary and upon coder review may  ?be revised to  meet current compliance requirements. ?Attending Participation: ?     I personally performed the entire procedure. ?Volney American, DO ?[name] DO, DO ?08/20/2021 11:05:46 AM ?This report has been signed electronically. ?Number of Addenda: 0 ?Note Initiated On: 08/20/2021 10:45 AM ?Estimated Blood Loss:  Estimated blood loss was minimal. ?     [facility] ?

## 2021-08-20 NOTE — Interval H&P Note (Signed)
History and Physical Interval Note: Preprocedure H&P from 08/20/21 ? was reviewed and there was no interval change after seeing and examining the patient.  Written consent was obtained from the patient after discussion of risks, benefits, and alternatives. Patient has consented to proceed with Esophagogastroduodenoscopy with possible intervention ? ? ?08/20/2021 ?10:44 AM ? ?Andrea [name]  has presented today for surgery, with the diagnosis of Gastroesophageal reflux disease, unspecified whether esophagitis present (K21.9) ?Dysphagia, unspecified type (R13.10) ?Regurgitation and rechewing of food (R11.10).  The various methods of treatment have been discussed with the patient and family. After consideration of risks, benefits and other options for treatment, the patient has consented to  Procedure(s): ?ESOPHAGOGASTRODUODENOSCOPY (EGD) (N/A) as a surgical intervention.  The patient's history has been reviewed, patient examined, no change in status, stable for surgery.  I have reviewed the patient's chart and labs.  Questions were answered to the patient's satisfaction.   ? ? ?[name] ? ? ?

## 2021-08-20 NOTE — Anesthesia Preprocedure Evaluation (Signed)
Anesthesia Evaluation  ?Patient identified by MRN, date of birth, ID band ?Patient awake ? ? ? ?Reviewed: ?Allergy & Precautions, NPO status , Patient's Chart, lab work & pertinent test results ? ?History of Anesthesia Complications ?(+) PONV and history of anesthetic complications ? ?Airway ?Mallampati: II ? ?TM Distance: >3 FB ?Neck ROM: Full ? ? ? Dental ? ?(+) Dental Advidsory Given, Poor Dentition ?  ?Pulmonary ?neg shortness of breath, neg sleep apnea, neg COPD, neg recent URI, former smoker,  ?  ?breath sounds clear to auscultation- rhonchi ?(-) wheezing ? ? ? ? ? Cardiovascular ?hypertension, Pt. on medications ?(-) angina+ Peripheral Vascular Disease  ?(-) CAD, (-) Past MI, (-) Cardiac Stents and (-) CABG + Valvular Problems/Murmurs  ?Rhythm:Regular Rate:Normal ?- Systolic murmurs and - Diastolic murmurs ? ?  ?Neuro/Psych ?neg Seizures PSYCHIATRIC DISORDERS Depression negative neurological ROS ?   ? GI/Hepatic ?Neg liver ROS, GERD  ,  ?Endo/Other  ?negative endocrine ROSneg diabetes ? Renal/GU ?negative Renal ROS  ? ?  ?Musculoskeletal ? ?(+) Arthritis ,  ? Abdominal ?(+) - obese,   ?Peds ? Hematology ?negative hematology ROS ?(+)   ?Anesthesia Other Findings ?Past Medical History: ?2008: Breast cancer ([place]) ?    Comment:  RT LUMPECTOMY ?2011: Breast cancer ([place]) ?    Comment:  RT MASTECTOMY ?[id number] : Cancer ([place]) ?    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             breast ?No date: Cervical post-laminectomy syndrome ?No date: Chronic constipation ?No date: Complication of anesthesia ?    Comment:  itching real bad or msucles spasms ?No date: Cubital tunnel syndrome ?No date: Depression ?No date: Disorders of sacrum ?No date: Disturbance of skin sensation ?No date: GERD (gastroesophageal reflux disease) ?No date: History of colon polyps ?No date: Hot flashes ?No date: Hx of diplopia ?    Comment:  right eye ?No date: Hyperlipidemia ?No date:  Hypertension ?No date: Insomnia ?No date: Late effects of acute poliomyelitis ?No date: Lateral epicondylitis  of elbow ?No date: Loss of memory ?No date: Menopausal state ?No date: Osteoporosis ?No date: Pain in joint of left shoulder ?2011: Parkinson's disease ([place]) ?2011: Peripheral vascular disease ([place]) ?    Comment:  venous stasis  ?No date: Personal history of radiation therapy ?2008: Radiation ?    Comment:  BREAST CA ?No date: Tremor ?No date: Unspecified musculoskeletal disorders and symptoms referable  ?to neck ?    Comment:  cervical/trapezius ?No date: Vitamin D deficiency ? ? Reproductive/Obstetrics ? ?  ? ? ? ? ? ? ? ? ? ? ? ? ? ?  ?  ? ? ? ? ? ? ? ? ?Anesthesia Physical ? ?Anesthesia Plan ? ?ASA: 3 ? ?Anesthesia Plan: General  ? ?Post-op Pain Management:   ? ?Induction: Intravenous ? ?PONV Risk Score and Plan: 2 and Propofol infusion ? ?Airway Management Planned: Natural Airway and Nasal Cannula ? ?Additional Equipment:  ? ?Intra-op Plan:  ? ?Post-operative Plan:  ? ?Informed Consent: I have reviewed the patients History and Physical, chart, labs and discussed the procedure including the risks, benefits and alternatives for the proposed anesthesia with the patient or authorized representative who has indicated his/her understanding and acceptance.  ? ? ? ?Dental advisory given ? ?Plan Discussed with: CRNA and Anesthesiologist ? ?Anesthesia Plan Comments:   ? ? ? ? ? ? ?Anesthesia Quick Evaluation ? ?

## 2021-08-20 NOTE — Transfer of Care (Signed)
Immediate Anesthesia Transfer of Care Note ? ?Patient: Andrea [name] ? ?Procedure(s) Performed: ESOPHAGOGASTRODUODENOSCOPY (EGD) ? ?Patient Location: Endoscopy Unit ? ?Anesthesia Type:General ? ?Level of Consciousness: drowsy ? ?Airway & Oxygen Therapy: Patient Spontanous Breathing and Patient connected to face mask oxygen ? ?Post-op Assessment: Report given to RN and Post -op Vital signs reviewed and stable ? ?Post vital signs: Reviewed and stable ? ?Last Vitals:  ?Vitals Value Taken Time  ?BP 143/61 08/20/21 1105  ?Temp    ?Pulse 62 08/20/21 1105  ?Resp 9 08/20/21 1105  ?SpO2 100 % 08/20/21 1105  ? ? ?Last Pain:  ?Vitals:  ? 08/20/21 0955  ?TempSrc: Temporal  ?PainSc: 4   ?   ? ?  ? ?Complications: No notable events documented. ?

## 2021-08-20 NOTE — Anesthesia Postprocedure Evaluation (Signed)
Anesthesia Post Note ? ?Patient: Andrea [name] ? ?Procedure(s) Performed: ESOPHAGOGASTRODUODENOSCOPY (EGD) ? ?Patient location during evaluation: Endoscopy ?Anesthesia Type: General ?Level of consciousness: awake and alert ?Pain management: pain level controlled ?Vital Signs Assessment: post-procedure vital signs reviewed and stable ?Respiratory status: spontaneous breathing, nonlabored ventilation, respiratory function stable and patient connected to nasal cannula oxygen ?Cardiovascular status: blood pressure returned to baseline and stable ?Postop Assessment: no apparent nausea or vomiting ?Anesthetic complications: no ? ? ?No notable events documented. ? ? ?Last Vitals:  ?Vitals:  ? 08/20/21 1120 08/20/21 1130  ?BP: (!) 181/80 (!) 183/88  ?Pulse:  71  ?Resp: 13 16  ?Temp:    ?SpO2: 100% 100%  ?  ?Last Pain:  ?Vitals:  ? 08/20/21 1100  ?TempSrc: Temporal  ?PainSc:   ? ? ?  ?  ?  ?  ?  ?  ? ?Martha Clan ? ? ? ? ?

## 2021-08-21 ENCOUNTER — Encounter: Payer: Self-pay | Admitting: Gastroenterology

## 2021-08-21 LAB — SURGICAL PATHOLOGY

## 2021-08-26 ENCOUNTER — Telehealth: Payer: Self-pay | Admitting: Acute Care

## 2021-08-26 NOTE — Telephone Encounter (Signed)
I have called the patient with the results of her low-dose CT.  Her scan was read as a lung RADS 2, however there was notation of new areas of mild medial left lower lobe cluster groundglass and less so airspace opacities that were suspicious for minimal interval infection.  I spoke with the patient and she states she has had no signs or symptoms of infection.  No fever, chest pain, change in secretion color, or hemoptysis.Marland Kitchen  ?Therefore we will schedule her for her annual screening scan in April 2024.  Patient is in agreement with this plan. ? ?Denise please fax results to PCP and place order for annual screening scan.  Thank you so much ?

## 2021-08-27 ENCOUNTER — Other Ambulatory Visit: Payer: Self-pay | Admitting: Acute Care

## 2021-08-27 DIAGNOSIS — Z122 Encounter for screening for malignant neoplasm of respiratory organs: Secondary | ICD-10-CM

## 2021-08-27 DIAGNOSIS — Z87891 Personal history of nicotine dependence: Secondary | ICD-10-CM

## 2021-08-27 NOTE — Telephone Encounter (Signed)
Results faxed to PCP. Order placed for 1 yr f/u low dose screening CT scan.  ?

## 2021-09-03 ENCOUNTER — Encounter: Payer: Self-pay | Admitting: Podiatry

## 2021-09-03 ENCOUNTER — Other Ambulatory Visit: Payer: Self-pay | Admitting: Podiatry

## 2021-09-03 DIAGNOSIS — M2042 Other hammer toe(s) (acquired), left foot: Secondary | ICD-10-CM | POA: Diagnosis not present

## 2021-09-03 DIAGNOSIS — M2041 Other hammer toe(s) (acquired), right foot: Secondary | ICD-10-CM | POA: Diagnosis not present

## 2021-09-03 MED ORDER — OXYCODONE-ACETAMINOPHEN 5-325 MG PO TABS: 1.0000 | ORAL_TABLET | ORAL | 0 refills | Status: DC | PRN

## 2021-09-03 NOTE — Progress Notes (Signed)
PRN postop 

## 2021-09-04 ENCOUNTER — Encounter: Payer: Medicare Other | Admitting: Podiatry

## 2021-09-09 ENCOUNTER — Encounter: Payer: Self-pay | Admitting: Podiatry

## 2021-09-11 ENCOUNTER — Ambulatory Visit (INDEPENDENT_AMBULATORY_CARE_PROVIDER_SITE_OTHER): Payer: Medicare Other | Admitting: Podiatry

## 2021-09-11 ENCOUNTER — Ambulatory Visit (INDEPENDENT_AMBULATORY_CARE_PROVIDER_SITE_OTHER): Payer: Medicare Other

## 2021-09-11 DIAGNOSIS — M2042 Other hammer toe(s) (acquired), left foot: Secondary | ICD-10-CM

## 2021-09-11 DIAGNOSIS — M2041 Other hammer toe(s) (acquired), right foot: Secondary | ICD-10-CM

## 2021-09-11 DIAGNOSIS — Z9889 Other specified postprocedural states: Secondary | ICD-10-CM

## 2021-09-11 NOTE — Progress Notes (Signed)
? ?Subjective:  ?Patient presents today status post left second toe amputation as well as flexor tenotomy's to the second and third digit right foot. DOS: 07/04/2021.  Patient states that she is doing well.  No pain.  She has been weightbearing in the postsurgical shoes as instructed.  No new complaints at this time ? ?Past Medical History:  ?Diagnosis Date  ? Allergic rhinitis due to allergen   ? Allergy   ? Breast cancer ([place]) 2008  ? RT LUMPECTOMY  ? Breast cancer [facility]) 2011  ? RT MASTECTOMY  ? Cancer [facility]) [id number],2993   ?  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 Cervical post-laminectomy syndrome   ? Chronic constipation   ? Colon polyp 2010  ? Complication of anesthesia   ? itching real bad or msucles spasms  ? COPD (chronic obstructive pulmonary disease) ([place]) 02/21/2019  ? Cubital tunnel syndrome   ? Depression   ? Disorders of sacrum   ? Disturbance of skin sensation   ? Emphysema lung ([place]) 2020  ? GERD (gastroesophageal reflux disease)   ? Heart murmur   ? History of abnormal cervical Pap smear   ? History of cataract   ? History of colon polyps   ? History of pneumonia 2008  ? Hot flashes   ? Hx of diplopia   ? right eye  ? Hyperlipidemia   ? Hypertension   ? Insomnia   ? Late effects of acute poliomyelitis   ? Lateral epicondylitis  of elbow   ? Loss of memory   ? Menopausal state   ? Neck pain   ? Osteoporosis   ? Pain in joint of left shoulder   ? Parkinson's disease ([place]) 2011  ? Peripheral vascular disease ([place]) 2011  ? venous stasis   ? Personal history of radiation therapy   ? PONV (postoperative nausea and vomiting)   ? Post-polio syndrome   ? Radiation 2008  ? BREAST CA  ? Sleep apnea   ? Tremor   ? Unspecified musculoskeletal disorders and symptoms referable to neck   ? cervical/trapezius  ? Vitamin D deficiency   ? ?  ? ?Objective/Physical Exam ?Neurovascular status intact.  Skin incisions appear to be well coapted with sutures intact. No sign of infectious process noted. No  dehiscence. No active bleeding noted. Moderate edema noted to the surgical extremity.  The toes to the right second and third digit are in a more rectus alignment with alleviation of the contracture deformity.  The small percutaneous stab incisions have healed.  No drainage. ? ?Radiographic Exam:  ?Digits 2 and 3 of the right foot do appear to be more rectus in alignment compared to prior x-rays.  Absence of the left second toe noted at the level of the MTP with routine healing. ? ?Assessment: ?1. s/p flexor tenotomy 2, 3 RT.  Second toe amputation LT.. DOS: 08/04/2021 ? ? ?Plan of Care:  ?1. Patient was evaluated. X-rays reviewed ?2.  Dressings changed left foot.  Clean dry and intact x1 week ?3.  Patient may resume regular tennis shoes to the right foot. ?4.  Return to clinic in 1 week for reevaluation and possible suture removal ? ? ?Edrick Kins, DPM ?[employer] ? ?Dr. Edrick Kins, DPM  ?  ?[address]                                    ?  [place], [place] [address]                ?Office [phone]  ?Fax [phone] ? ? ? ? ? ?

## 2021-09-18 ENCOUNTER — Ambulatory Visit (INDEPENDENT_AMBULATORY_CARE_PROVIDER_SITE_OTHER): Payer: Medicare Other | Admitting: Podiatry

## 2021-09-18 ENCOUNTER — Encounter: Payer: Self-pay | Admitting: Podiatry

## 2021-09-18 DIAGNOSIS — Z9889 Other specified postprocedural states: Secondary | ICD-10-CM

## 2021-09-18 NOTE — Progress Notes (Signed)
Subjective:  Patient presents today status post left second toe amputation as well as flexor tenotomy's to the second and third digit right foot. DOS: 09/03/2021.  Patient continues to do well.  No pain.  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   Cervical post-laminectomy syndrome    Chronic constipation    Colon polyp 6283   Complication of anesthesia    itching real bad or msucles spasms   COPD (chronic obstructive pulmonary disease) ([place]) 02/21/2019   Cubital tunnel syndrome    Depression    Disorders of sacrum    Disturbance of skin sensation    Emphysema lung ([place]) 2020   GERD (gastroesophageal reflux disease)    Heart murmur    History of abnormal cervical Pap smear    History of cataract    History of colon polyps    History of pneumonia 2008   Hot flashes    Hx of diplopia    right eye   Hyperlipidemia    Hypertension    Insomnia    Late effects of acute poliomyelitis    Lateral epicondylitis  of elbow    Loss of memory    Menopausal state    Neck pain    Osteoporosis    Pain in joint of left shoulder    Parkinson's disease ([place]) 2011   Peripheral vascular disease ([place]) 2011   venous stasis    Personal history of radiation therapy    PONV (postoperative nausea and vomiting)    Post-polio syndrome    Radiation 2008   BREAST CA   Sleep apnea    Tremor    Unspecified musculoskeletal disorders and symptoms referable to neck    cervical/trapezius   Vitamin D deficiency       Objective/Physical Exam Neurovascular status intact.  Skin incisions appear to be well coapted with sutures intact. No sign of infectious process noted. No dehiscence. No active bleeding noted. Moderate edema  noted to the surgical extremity.  The toes to the right second and third digit are in a more rectus alignment with alleviation of the contracture deformity.  The small percutaneous stab incisions have healed.  No drainage.  Radiographic Exam 09/11/2021:  Digits 2 and 3 of the right foot do appear to be more rectus in alignment compared to prior x-rays.  Absence of the left second toe noted at the level of the MTP with routine healing.  Assessment: 1. s/p flexor tenotomy 2, 3 RT.  Second toe amputation LT.. DOS: 09/03/2021   Plan of Care:  1. Patient was evaluated. 2.  The patient is status post 2 weeks now.  It appears that the incision site is not quite ready to have sutures removed 3.  Return to clinic next visit for suture removal.  We will leave the sutures in for an additional week or so 4.  Patient may continue to apply antibiotic ointment and a light dressing to the amputation site  *Leaving for [facility] 10/05/2021    Edrick Kins, DPM [facility]  Dr. Edrick Kins, DPM    [address]  [place], [place] [address]                Office [phone]  Fax [phone]

## 2021-09-25 ENCOUNTER — Ambulatory Visit
Admission: RE | Admit: 2021-09-25 | Discharge: 2021-09-25 | Disposition: A | Discharge status: Home / Self Care | Payer: Medicare Other | Source: Ambulatory Visit | Attending: General Surgery | Admitting: General Surgery

## 2021-09-25 ENCOUNTER — Encounter: Payer: PRIVATE HEALTH INSURANCE | Admitting: Podiatry

## 2021-09-25 DIAGNOSIS — Z1231 Encounter for screening mammogram for malignant neoplasm of breast: Secondary | ICD-10-CM | POA: Insufficient documentation

## 2021-10-02 ENCOUNTER — Encounter: Payer: Self-pay | Admitting: Podiatry

## 2021-10-02 ENCOUNTER — Ambulatory Visit (INDEPENDENT_AMBULATORY_CARE_PROVIDER_SITE_OTHER): Payer: Medicare Other | Admitting: Podiatry

## 2021-10-02 DIAGNOSIS — Z9889 Other specified postprocedural states: Secondary | ICD-10-CM

## 2021-10-13 NOTE — Progress Notes (Signed)
Subjective:  Patient presents today status post left second toe amputation as well as flexor tenotomy's to the second and third digit right foot. DOS: 09/03/2021.  Patient states that she is doing very well.  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   Cervical post-laminectomy syndrome    Chronic constipation    Colon polyp 3762   Complication of anesthesia    itching real bad or msucles spasms   COPD (chronic obstructive pulmonary disease) ([place]) 02/21/2019   Cubital tunnel syndrome    Depression    Disorders of sacrum    Disturbance of skin sensation    Emphysema lung ([place]) 2020   GERD (gastroesophageal reflux disease)    Heart murmur    History of abnormal cervical Pap smear    History of cataract    History of colon polyps    History of pneumonia 2008   Hot flashes    Hx of diplopia    right eye   Hyperlipidemia    Hypertension    Insomnia    Late effects of acute poliomyelitis    Lateral epicondylitis  of elbow    Loss of memory    Menopausal state    Neck pain    Osteoporosis    Pain in joint of left shoulder    Parkinson's disease ([place]) 2011   Peripheral vascular disease ([place]) 2011   venous stasis    Personal history of radiation therapy    PONV (postoperative nausea and vomiting)    Post-polio syndrome    Radiation 2008   BREAST CA   Sleep apnea    Tremor    Unspecified musculoskeletal disorders and symptoms referable to neck    cervical/trapezius   Vitamin D deficiency       Objective/Physical Exam Neurovascular status intact.  Skin incisions appear to be well coapted with sutures intact. No sign of infectious process noted. No dehiscence. No active bleeding noted.  Negative for any significant edema  noted to the surgical extremity.  The toes to the right second and third digit are in a more rectus alignment with alleviation of the contracture deformity.  The small percutaneous stab incisions have healed.  No drainage.  Radiographic Exam 09/11/2021:  Digits 2 and 3 of the right foot do appear to be more rectus in alignment compared to prior x-rays.  Absence of the left second toe noted at the level of the MTP with routine healing.  Assessment: 1. s/p flexor tenotomy 2, 3 RT.  Second toe amputation LT.. DOS: 09/03/2021   Plan of Care:  1. Patient was evaluated. 2.  Sutures removed at the left second toe amputation site.  The skin incisions are well coapted and healed 3.  Patient may wear good supportive shoes and sneakers 4.  Patient may slowly increase over the next 3-4 weeks to full activity no restrictions 5.  Return to clinic as needed  *Leaving for [facility] 10/05/2021    Edrick Kins, DPM [facility]  Dr. Edrick Kins, DPM    [address]  [place], [place] [address]                Office [phone]  Fax [phone]

## 2022-01-13 ENCOUNTER — Other Ambulatory Visit: Payer: Self-pay | Admitting: Orthopedic Surgery

## 2022-01-13 DIAGNOSIS — M25512 Pain in left shoulder: Secondary | ICD-10-CM

## 2022-01-13 DIAGNOSIS — Z96612 Presence of left artificial shoulder joint: Secondary | ICD-10-CM

## 2022-01-21 ENCOUNTER — Other Ambulatory Visit: Payer: Medicare Other

## 2022-01-26 ENCOUNTER — Ambulatory Visit
Admission: RE | Admit: 2022-01-26 | Discharge: 2022-01-26 | Disposition: A | Discharge status: Home / Self Care | Payer: Medicare Other | Source: Ambulatory Visit | Attending: Orthopedic Surgery | Admitting: Orthopedic Surgery

## 2022-01-26 DIAGNOSIS — Z96612 Presence of left artificial shoulder joint: Secondary | ICD-10-CM | POA: Insufficient documentation

## 2022-01-26 DIAGNOSIS — M25512 Pain in left shoulder: Secondary | ICD-10-CM | POA: Insufficient documentation

## 2022-01-26 MED ORDER — LIDOCAINE HCL (PF) 1 % IJ SOLN
10.0000 mL | Freq: Once | INTRAMUSCULAR | Status: AC
  Administered 2022-01-26: 10 mL

## 2022-01-26 MED ORDER — IOHEXOL 180 MG/ML  SOLN
20.0000 mL | Freq: Once | INTRAMUSCULAR | Status: AC | PRN
  Administered 2022-01-26: 15 mL

## 2022-01-26 MED ORDER — SODIUM CHLORIDE (PF) 0.9 % IJ SOLN
20.0000 mL | INTRAMUSCULAR | Status: DC | PRN
  Administered 2022-01-26: 5 mL via INTRAVENOUS

## 2022-01-26 NOTE — Procedures (Signed)
PROCEDURE SUMMARY:  Successful fluoroscopic guided left shoulder arthrogram for CT scan.  No immediate complications.  Pt tolerated well.   EBL = <1 ml  Please see full dictation in imaging section of Epic for procedure details.   Narda Rutherford, AGNP-BC 01/26/2022, 2:48 PM

## 2022-02-01 ENCOUNTER — Other Ambulatory Visit: Payer: Self-pay | Admitting: Orthopedic Surgery

## 2022-02-01 DIAGNOSIS — Z96612 Presence of left artificial shoulder joint: Secondary | ICD-10-CM

## 2022-03-16 ENCOUNTER — Other Ambulatory Visit: Payer: Self-pay | Admitting: Orthopedic Surgery

## 2022-03-16 DIAGNOSIS — M25511 Pain in right shoulder: Secondary | ICD-10-CM

## 2022-03-31 ENCOUNTER — Ambulatory Visit
Admission: RE | Admit: 2022-03-31 | Discharge: 2022-03-31 | Disposition: A | Discharge status: Home / Self Care | Payer: Medicare Other | Source: Ambulatory Visit | Attending: Orthopedic Surgery | Admitting: Orthopedic Surgery

## 2022-03-31 DIAGNOSIS — M25511 Pain in right shoulder: Secondary | ICD-10-CM | POA: Insufficient documentation

## 2022-04-30 ENCOUNTER — Ambulatory Visit: Payer: Medicare Other

## 2022-05-11 ENCOUNTER — Other Ambulatory Visit: Payer: Self-pay | Admitting: Orthopedic Surgery

## 2022-05-11 ENCOUNTER — Encounter: Payer: Self-pay | Admitting: Orthopedic Surgery

## 2022-05-11 DIAGNOSIS — Z01818 Encounter for other preprocedural examination: Secondary | ICD-10-CM

## 2022-05-11 NOTE — H&P (Signed)
NAME: Andrea [name] MRN:   [id number] DOB:   July 29, 1950     HISTORY AND PHYSICAL  CHIEF COMPLAINT:  left shoulder pain  HISTORY:   Andrea [name] Hensonis a 72 y.o. female  with left  Shoulder Pain Patient complains of left shoulder pain. The symptoms began several months ago. Aggravating factors: repetitive activity. Pain is located between the neck and shoulder. Discomfort is described as aching. Symptoms are exacerbated by repetitive movements. Plan for Left shoulder revision.  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   Cervical post-laminectomy syndrome    Chronic constipation    Colon polyp 2542   Complication of anesthesia    itching real bad or msucles spasms   COPD (chronic obstructive pulmonary disease) ([place]) 02/21/2019   Cubital tunnel syndrome    Depression    Disorders of sacrum    Disturbance of skin sensation    Emphysema lung ([place]) 2020   GERD (gastroesophageal reflux disease)    Heart murmur    History of abnormal cervical Pap smear    History of cataract    History of colon polyps    History of pneumonia 2008   Hot flashes    Hx of diplopia    right eye   Hyperlipidemia    Hypertension    Insomnia    Late effects of acute poliomyelitis    Lateral epicondylitis  of elbow    Loss of memory    Menopausal state    Neck pain    Osteoporosis    Pain in joint of left shoulder    Parkinson's disease ([place]) 2011   Peripheral vascular disease ([place]) 2011   venous stasis    Personal history of radiation therapy    PONV (postoperative nausea and vomiting)    Post-polio syndrome    Radiation 2008   BREAST CA   Sleep apnea    Tremor    Unspecified musculoskeletal disorders and symptoms referable to neck    cervical/trapezius    Vitamin D deficiency     PAST SURGICAL HISTORY:   Past Surgical History:  Procedure Laterality Date   ABDOMINAL HYSTERECTOMY  1997   ANTERIOR CERVICAL DECOMP/DISCECTOMY FUSION N/A 02/01/2019   Procedure: Anterior Cervical Decompression Fusion Cervical three-four, Cervical four-five, hardware removal Cervical five-six;  Surgeon: Earnie Larsson, MD;  Location: [facility];  Service: Neurosurgery;  Laterality: N/A;   BREAST LUMPECTOMY Right 2008   4 cm area of DCIS, margins less than 1 mm. Treated with wide excision, whole breast radiation   BREAST SURGERY Right 12/04/2009   Right simple mastectomy, sentinel node biopsy.   CARPAL TUNNEL RELEASE     COLONOSCOPY  [id number]   Dr. Allen Norris, Dr. Vira Agar   COLONOSCOPY     COLONOSCOPY WITH PROPOFOL N/A 05/23/2017   Procedure: COLONOSCOPY WITH PROPOFOL;  Surgeon: Manya Silvas, MD;  Location: [facility] ENDOSCOPY;  Service: Endoscopy;  Laterality: N/A;   ELBOW SURGERY Left 2011   ELBOW SURGERY Left 2015   ESOPHAGOGASTRODUODENOSCOPY     ESOPHAGOGASTRODUODENOSCOPY N/A 08/20/2021   Procedure: ESOPHAGOGASTRODUODENOSCOPY (EGD);  Surgeon: [name], DO;  Location: [facility] ENDOSCOPY;  Service: Gastroenterology;  Laterality: N/A;   FLEXIBLE SIGMOIDOSCOPY  07/19/2000  HIP SURGERY Right 06/19/2012   LEG SURGERY Right 1958   Corrcetive surgery for polio   LEG SURGERY Left 1958   corrective surgery for polio   MASTECTOMY Right 2011   BREAST CA   rectocele/enterocelle repair and perinoplasty     SHOULDER ARTHROSCOPY W/ ROTATOR CUFF REPAIR Bilateral 2010,2012   SHOULDER ARTHROSCOPY WITH OPEN ROTATOR CUFF REPAIR Left 08/19/2015   Procedure: SHOULDER ARTHROSCOPY WITH  MINI OPEN ROTATOR CUFF REPAIR,SUBACROMIAL DECOMPRESSION, ARTHROSCOPIC BICEPS TENODESIS;  Surgeon: Thornton Park, MD;  Location: [facility] ORS;  Service: Orthopedics;  Laterality: Left;   SHOULDER SURGERY Left 2014   SPINE SURGERY     TOE FUSION Left 1970   little toe fusion   TONSILLECTOMY      TUBAL LIGATION     two left foot surgeries:polio related      MEDICATIONS:  (Not in a hospital admission)   ALLERGIES:   Allergies  Allergen Reactions   Lisinopril     Raises BP    REVIEW OF SYSTEMS:   Negative except HPI  FAMILY HISTORY:   Family History  Problem Relation Age of Onset   Cancer Mother    Arthritis Mother    Hypertension Mother    Osteoporosis Mother    Glaucoma Mother    Diabetes Mother    Parkinsonism Mother    Lung cancer Mother    Hyperlipidemia Mother    Hip fracture Mother    Hyperlipidemia Father    Lung cancer Father    Hypertension Father    Cancer Father    Hyperlipidemia Sister    Diabetes Sister    Hypertension Sister    Rheumatic fever Sister    Alcohol abuse Sister    COPD Sister    Breast cancer Other 70   Stroke Maternal Aunt    Diabetes Maternal Uncle    Cancer Niece    Breast cancer Niece     SOCIAL HISTORY:   reports that she has been smoking cigarettes. She has a 50.00 pack-year smoking history. She has never used smokeless tobacco. She reports that she does not drink alcohol and does not use drugs.  PHYSICAL EXAM:  General appearance: alert, cooperative, and no distress Resp: clear to auscultation bilaterally Cardio: regular rate and rhythm, S1, S2 normal, no murmur, click, rub or gallop GI: soft, non-tender; bowel sounds normal; no masses,  no organomegaly Extremities: extremities normal, atraumatic, no cyanosis or edema and Homans sign is negative, no sign of DVT Pulses: 2+ and symmetric Skin: Skin color, texture, turgor normal. No rashes or lesions Neurologic: Alert and oriented X 3, normal strength and tone. Normal symmetric reflexes. Normal coordination and gait    LABORATORY STUDIES: No results for input(s): "WBC", "HGB", "HCT", "PLT" in the last 72 hours.  No results for input(s): "NA", "K", "CL", "CO2", "GLUCOSE", "BUN", "CREATININE", "CALCIUM" in the last 72 hours.  STUDIES/RESULTS:  No results  found.  ASSESSMENT:  Left painful shoulder hardware        Active Problems:   * No active hospital problems. *    PLAN: Left total shoulder revision   Andrea [name] 05/11/2022. 10:49 AM

## 2022-05-13 ENCOUNTER — Other Ambulatory Visit: Payer: Medicare Other

## 2022-06-07 ENCOUNTER — Ambulatory Visit
Admission: RE | Admit: 2022-06-07 | Discharge: 2022-06-07 | Disposition: A | Discharge status: Home / Self Care | Payer: Medicare Other | Source: Ambulatory Visit | Attending: Orthopedic Surgery | Admitting: Orthopedic Surgery

## 2022-06-07 DIAGNOSIS — Z96612 Presence of left artificial shoulder joint: Secondary | ICD-10-CM | POA: Insufficient documentation

## 2022-06-10 ENCOUNTER — Inpatient Hospital Stay: Admission: RE | Admit: 2022-06-10 | Payer: Medicare Other | Source: Ambulatory Visit

## 2022-06-17 NOTE — H&P (Signed)
Patient did not complete prep and still brown/tan stool production. Procedure to be postponed until later date.  Office will call patient.  Ronne Binning, [facility]

## 2022-06-18 ENCOUNTER — Encounter: Payer: Self-pay | Admitting: Anesthesiology

## 2022-06-18 ENCOUNTER — Ambulatory Visit
Admission: RE | Admit: 2022-06-18 | Discharge: 2022-06-18 | Disposition: A | Discharge status: Home / Self Care | Payer: Medicare Other | Attending: Gastroenterology | Admitting: Gastroenterology

## 2022-06-18 ENCOUNTER — Encounter
Admission: RE | Disposition: A | Discharge status: Home / Self Care | Payer: Self-pay | Source: Home / Self Care | Attending: Gastroenterology

## 2022-06-18 DIAGNOSIS — Z538 Procedure and treatment not carried out for other reasons: Secondary | ICD-10-CM | POA: Diagnosis not present

## 2022-06-18 DIAGNOSIS — Z86010 Personal history of colonic polyps: Secondary | ICD-10-CM | POA: Insufficient documentation

## 2022-06-18 HISTORY — PX: COLONOSCOPY: SHX5424

## 2022-06-18 SURGERY — COLONOSCOPY
Anesthesia: General

## 2022-06-18 NOTE — Progress Notes (Signed)
Procedure cancelled due to patient not being cleaned out enough by prep. She could not finish all of it, says she would prefer enemas or pills next time.

## 2022-06-21 ENCOUNTER — Encounter: Payer: Self-pay | Admitting: Gastroenterology

## 2022-06-22 ENCOUNTER — Inpatient Hospital Stay: Admit: 2022-06-22 | Payer: Medicare Other | Admitting: Orthopedic Surgery

## 2022-06-22 SURGERY — REVISION, TOTAL ARTHROPLASTY, SHOULDER
Anesthesia: General | Site: Shoulder | Laterality: Left

## 2022-07-29 ENCOUNTER — Other Ambulatory Visit: Payer: Self-pay | Admitting: General Surgery

## 2022-07-29 DIAGNOSIS — Z1231 Encounter for screening mammogram for malignant neoplasm of breast: Secondary | ICD-10-CM

## 2022-08-12 ENCOUNTER — Ambulatory Visit: Payer: Medicare Other | Attending: Orthopedic Surgery | Admitting: Physical Therapy

## 2022-08-12 ENCOUNTER — Other Ambulatory Visit: Payer: Self-pay

## 2022-08-12 ENCOUNTER — Encounter: Payer: Self-pay | Admitting: Physical Therapy

## 2022-08-12 DIAGNOSIS — Z96612 Presence of left artificial shoulder joint: Secondary | ICD-10-CM | POA: Diagnosis present

## 2022-08-12 DIAGNOSIS — M25512 Pain in left shoulder: Secondary | ICD-10-CM | POA: Diagnosis present

## 2022-08-12 NOTE — Therapy (Signed)
OUTPATIENT PHYSICAL THERAPY SHOULDER EVALUATION   Patient Name: Andrea [name] MRN: [id number] DOB:08-Sep-1950, 72 y.o., female Today's Date: 08/12/2022  END OF SESSION:  PT End of Session - 08/12/22 2046     Visit Number 1    Number of Visits 20    Date for PT Re-Evaluation 10/21/22    Authorization Type Medicare    Authorization Time Period 08/12/22-10/21/22    Authorization - Visit Number 1    Authorization - Number of Visits 20    Progress Note Due on Visit 10    PT Start Time 1630    PT Stop Time 1715    PT Time Calculation (min) 45 min    Activity Tolerance Patient tolerated treatment well    Behavior During Therapy [facility] for tasks assessed/performed             Past Medical History:  Diagnosis Date   Allergic rhinitis due to allergen    Allergy    Breast cancer 2008   RT LUMPECTOMY   Breast cancer 2011   RT MASTECTOMY   Cancer 2008,2011     High-grade DCIS,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   Cervical post-laminectomy syndrome    Chronic constipation    Colon polyp 2010   Complication of anesthesia    itching real bad or msucles spasms   COPD (chronic obstructive pulmonary disease) 02/21/2019   Cubital tunnel syndrome    Depression    Disorders of sacrum    Disturbance of skin sensation    Emphysema lung 2020   GERD (gastroesophageal reflux disease)    Heart murmur    History of abnormal cervical Pap smear    History of cataract    History of colon polyps    History of pneumonia 2008   Hot flashes    Hx of diplopia    right eye   Hyperlipidemia    Hypertension    Insomnia    Late effects of acute poliomyelitis    Lateral epicondylitis  of elbow    Loss of memory    Menopausal state    Neck pain    Osteoporosis    Pain in joint of left shoulder    Parkinson's disease 2011   Peripheral vascular disease 2011   venous stasis    Personal history of radiation therapy    PONV (postoperative nausea and vomiting)    Post-polio syndrome     Radiation 2008   BREAST CA   Sleep apnea    Tremor    Unspecified musculoskeletal disorders and symptoms referable to neck    cervical/trapezius   Vitamin D deficiency    Past Surgical History:  Procedure Laterality Date   ABDOMINAL HYSTERECTOMY  1997   ANTERIOR CERVICAL DECOMP/DISCECTOMY FUSION N/A 02/01/2019   Procedure: Anterior Cervical Decompression Fusion Cervical three-four, Cervical four-five, hardware removal Cervical five-six;  Surgeon: Julio Sicks, MD;  Location: [facility] OR;  Service: Neurosurgery;  Laterality: N/A;   BREAST LUMPECTOMY Right 2008   4 cm area of DCIS, margins less than 1 mm. Treated with wide excision, whole breast radiation   BREAST SURGERY Right 12/04/2009   Right simple mastectomy, sentinel node biopsy.   CARPAL TUNNEL RELEASE     COLONOSCOPY  2010,2015   Dr. Servando Snare, Dr. Mechele Collin   COLONOSCOPY     COLONOSCOPY N/A 06/18/2022   Procedure: COLONOSCOPY;  Surgeon: Jaynie Collins, DO;  Location: [facility] ENDOSCOPY;  Service: Gastroenterology;  Laterality: N/A;   COLONOSCOPY WITH PROPOFOL N/A 05/23/2017  Procedure: COLONOSCOPY WITH PROPOFOL;  Surgeon: Scot Jun, MD;  Location: [facility] ENDOSCOPY;  Service: Endoscopy;  Laterality: N/A;   ELBOW SURGERY Left 2011   ELBOW SURGERY Left 2015   ESOPHAGOGASTRODUODENOSCOPY     ESOPHAGOGASTRODUODENOSCOPY N/A 08/20/2021   Procedure: ESOPHAGOGASTRODUODENOSCOPY (EGD);  Surgeon: Jaynie Collins, DO;  Location: [facility] ENDOSCOPY;  Service: Gastroenterology;  Laterality: N/A;   FLEXIBLE SIGMOIDOSCOPY  07/19/2000   HIP SURGERY Right 06/19/2012   LEG SURGERY Right 1958   Corrcetive surgery for polio   LEG SURGERY Left 1958   corrective surgery for polio   MASTECTOMY Right 2011   BREAST CA   rectocele/enterocelle repair and perinoplasty     SHOULDER ARTHROSCOPY W/ ROTATOR CUFF REPAIR Bilateral 2010,2012   SHOULDER ARTHROSCOPY WITH OPEN ROTATOR CUFF REPAIR Left 08/19/2015   Procedure: SHOULDER ARTHROSCOPY WITH  MINI OPEN  ROTATOR CUFF REPAIR,SUBACROMIAL DECOMPRESSION, ARTHROSCOPIC BICEPS TENODESIS;  Surgeon: Juanell Fairly, MD;  Location: [facility] ORS;  Service: Orthopedics;  Laterality: Left;   SHOULDER SURGERY Left 2014   SPINE SURGERY     TOE FUSION Left 1970   little toe fusion   TONSILLECTOMY     TUBAL LIGATION     two left foot surgeries:polio related     Patient Active Problem List   Diagnosis Date Noted   Primary hypertension 07/28/2021   Insomnia 07/28/2021   History of right mastectomy 06/24/2021   Pneumonia 03/03/2021   Respiratory failure with hypoxia 03/03/2021   COPD with acute exacerbation 03/03/2021   Wheelchair dependence 10/09/2019   Cervical radiculitis 04/04/2019   Cervical spondylosis with myelopathy and radiculopathy 02/01/2019   B12 deficiency 07/29/2018   Osteoarthritis of hip 09/29/2017   Ataxia 04/15/2017   Loss of memory 04/15/2017   Cardiac murmur 03/04/2017   Hx of adenomatous colonic polyps 12/10/2016   Tremor 08/11/2016   Personal history of tobacco use, presenting hazards to health 06/13/2016   Falls frequently 08/05/2014   Personal history of malignant neoplasm of breast 07/26/2014   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   Lumbosacral spondylosis without myelopathy 04/30/2013   Subacute lumbar radiculopathy 04/30/2013   Chronic low back pain 02/19/2013   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 Marcelino Duster   REFERRING PROVIDER: Dr. Cassell Smiles   REFERRING DIAG: Left Shoulder Pain, Revision of Left Shoulder Arthoplasty   THERAPY DIAG:  Acute pain of left shoulder  S/P reverse total shoulder arthroplasty, left  Rationale for Evaluation and Treatment: Rehabilitation  ONSET DATE: April 3rd, 2024 for revision of left total shoulder arthroplasty   SUBJECTIVE:  SUBJECTIVE STATEMENT: See pertinent history  Hand dominance: Right  PERTINENT HISTORY: Pt had a revision of her left total shoulder arthroplasty on March 2nd, because her hardware came loose. She initially had a left total shoulder arthoplasty on June 22nd.    PAIN:  Are you having pain? Yes: NPRS scale: 4/10 Pain location: Anterior surface of left shoulder  Pain description: Achy  Aggravating factors: Moving shoulder  Relieving factors: Opiates and not moving shoulder   PRECAUTIONS: Shoulder wear sling   WEIGHT BEARING RESTRICTIONS: No  FALLS:  Has patient fallen in last 6 months? Yes. Number of falls 1; she fell while feeding birds and she attributes this to a mechanical fall  LIVING ENVIRONMENT: Lives with: lives with their spouse Lives in: Mobile home Stairs: No Has following equipment at home: Quad cane small base, Ramped entry, and Left KAFO   OCCUPATION: Retired   PLOF: Independent  PATIENT GOALS: She wants to be able to regain full use of her arm and she has not been able to use arm since June 2022  NEXT MD VISIT: April 16th   OBJECTIVE:   VITALS: BP 103/43 SpO2 95% HR 74   DIAGNOSTIC FINDINGS:  [facility] so not listed in notes   PATIENT SURVEYS:  FOTO 19/100 with target 46   COGNITION: Overall cognitive status: Within functional limits for tasks assessed     SENSATION: WFL  POSTURE: Forward head and rounded shoulders   UPPER EXTREMITY ROM:   Active ROM Right eval Left eval  Shoulder flexion 160 100  Shoulder extension    Shoulder abduction 100 NT   Shoulder adduction    Shoulder internal rotation    Shoulder external rotation    Elbow flexion    Elbow extension    Wrist flexion    Wrist extension    Wrist ulnar deviation    Wrist radial deviation     Wrist pronation    Wrist supination    (Blank rows = not tested)  UPPER EXTREMITY MMT:  MMT Right eval Left eval  Shoulder flexion NT NT  Shoulder extension NT NT   Shoulder abduction NT NT   Shoulder adduction    Shoulder internal rotation NT NT   Shoulder external rotation NT NT  Middle trapezius NT NT  Lower trapezius NT NT  Elbow flexion    Elbow extension    Wrist flexion    Wrist extension    Wrist ulnar deviation    Wrist radial deviation    Wrist pronation    Wrist supination    Grip strength (lbs)    (Blank rows = not tested)   JOINT MOBILITY TESTING:  NT   PALPATION:  Anterior left shoulder    TODAY'S TREATMENT:  DATE:   08/12/22 Pendulums on RUE Flexion and Extension 3 x 10  Pendulums on RUE Horizontal 3 x 10  Pendulums Circles on RUE 3 x 10  Supine Right Shoulder PROM Flex 100 degrees x 2   PATIENT EDUCATION: Education details: form and technique for correct exercise and precautions from surgical protocol provided to pt  Person educated: Patient Education method: Programmer, multimedia, Demonstration, Verbal cues, and Handouts Education comprehension: verbalized understanding, returned demonstration, and verbal cues required  HOME EXERCISE PROGRAM: Access Code: [email or web address] URL: [email or web address] Date: 08/12/2022 Prepared by: Ellin Goodie  Exercises - Supported Elbow Flexion Extension PROM  - 1 x daily - 3 sets - 10 reps - Horizontal Shoulder Pendulum with Table Support  - 1 x daily - 3 sets - 10 reps - Flexion-Extension Shoulder Pendulum with Table Support  - 1 x daily - 3 sets - 10 reps - Circular Shoulder Pendulum with Table Support  - 1 x daily - 3 sets - 10 reps - Supine Shoulder Flexion PROM  - 1 x daily - 3 sets - 10 reps  ASSESSMENT:  CLINICAL IMPRESSION: Patient is a 72 y.o. white woman who  was seen today for physical therapy evaluation and treatment for left shoulder pain s/p 1 week from left reverse total shoulder. She shows deficits that include decreased left shoulder ROM and strength and increased left shoulder pain. These deficits are making it difficult for pt to perform ADL with left UE such as cleaning, cooking, and cleaning. She will benefit from skilled PT to address these aforementioned deficits to return to restore left UE function to perform ADL tasks with increased ease without pain.    OBJECTIVE IMPAIRMENTS: decreased ROM, decreased strength, impaired flexibility, impaired UE functional use, and pain.   ACTIVITY LIMITATIONS: carrying, lifting, bathing, toileting, dressing, reach over head, and hygiene/grooming  PARTICIPATION LIMITATIONS: meal prep, cleaning, laundry, shopping, and yard work  PERSONAL FACTORS: Age, Past/current experiences, Time since onset of injury/illness/exacerbation, and 3+ comorbidities: COPD, Depression, Post Polio Syndrome  are also affecting patient's functional outcome.   REHAB POTENTIAL: Fair chronicity of issue and high number of co-morbidities   CLINICAL DECISION MAKING: Stable/uncomplicated  EVALUATION COMPLEXITY: Low   GOALS: Goals reviewed with patient? No  SHORT TERM GOALS: Target date: 08/26/2022   Pt will be independent with HEP in order to improve strength and balance in order to decrease fall risk and improve function at home and work. Baseline: NT  Goal status: INITIAL  2.  Patient will be able to stop wearing sling within 3 weeks as sign of shoulder healing.  Baseline: Wearing sling  Goal status: INITIAL   LONG TERM GOALS: Target date: 10/21/2022  Patient will have improved function and activity level as evidenced by an increase in FOTO score by 10 points or more.  Baseline: 19/100 target 46 Goal status: INITIAL  2.  Patient will improve left shoulder ROM to be near or greater than ROM of right shoulder to be  able to perform overhead tasks for ADLs. Baseline: Shoulder Flex R/L 100/160, Shoulder Abduction R/L 100/ NT, Shoulder ER NT, Shoulder IR NT, Shoulder Ext R/L  Goal status: INITIAL   3.  Patient will improve left shoulder strength to be near or greater than right shoulder to perform overhead tasks for improve UE function.  Baseline: NT Goal status: INITIAL   PLAN:  PT FREQUENCY: 1-2x/week  PT DURATION: 10 weeks  PLANNED INTERVENTIONS: Therapeutic exercises, Neuromuscular re-education, Joint mobilization, Joint manipulation, DME instructions, Dry Needling, Electrical  stimulation, Spinal manipulation, Spinal mobilization, Cryotherapy, Moist heat, Manual therapy, and Re-evaluation  PLAN FOR NEXT SESSION: Upper trap stretch, scapular retraction, R shoulder AAROM in 30 deg scaption   Ellin Goodieaniel Jaime Grizzell PT, DPT  08/12/2022, 8:48 PM

## 2022-08-16 ENCOUNTER — Ambulatory Visit: Payer: Medicare Other | Admitting: Physical Therapy

## 2022-08-18 ENCOUNTER — Ambulatory Visit: Payer: Medicare Other | Admitting: Physical Therapy

## 2022-08-18 DIAGNOSIS — M25512 Pain in left shoulder: Secondary | ICD-10-CM

## 2022-08-18 DIAGNOSIS — Z96612 Presence of left artificial shoulder joint: Secondary | ICD-10-CM

## 2022-08-18 NOTE — Therapy (Signed)
OUTPATIENT PHYSICAL THERAPY TREATMENT NOTE   Patient Name: Andrea [name] MRN: [id number] DOB:[birth year], 72 y.o., female Today's Date: 08/18/2022  PCP: Dr. Letitia Libra REFERRING PROVIDER: Dr. Cassell Smiles   END OF SESSION:   PT End of Session - 08/18/22 1634     Visit Number 2    Number of Visits 20    Date for PT Re-Evaluation 10/21/22    Authorization Type Medicare    Authorization Time Period 08/12/22-10/21/22    Authorization - Visit Number 2    Authorization - Number of Visits 20    Progress Note Due on Visit 10    PT Start Time 1545    PT Stop Time 1630    PT Time Calculation (min) 45 min    Activity Tolerance Patient tolerated treatment well    Behavior During Therapy [facility] for tasks assessed/performed             Past Medical History:  Diagnosis Date   Allergic rhinitis due to allergen    Allergy    Breast cancer 2008   RT LUMPECTOMY   Breast cancer 2011   RT MASTECTOMY   Cancer 2008,2011     High-grade DCIS,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   Cervical post-laminectomy syndrome    Chronic constipation    Colon polyp 2010   Complication of anesthesia    itching real bad or msucles spasms   COPD (chronic obstructive pulmonary disease) 02/21/2019   Cubital tunnel syndrome    Depression    Disorders of sacrum    Disturbance of skin sensation    Emphysema lung 2020   GERD (gastroesophageal reflux disease)    Heart murmur    History of abnormal cervical Pap smear    History of cataract    History of colon polyps    History of pneumonia 2008   Hot flashes    Hx of diplopia    right eye   Hyperlipidemia    Hypertension    Insomnia    Late effects of acute poliomyelitis    Lateral epicondylitis  of elbow    Loss of memory    Menopausal state    Neck pain    Osteoporosis    Pain in joint of left shoulder    Parkinson's disease 2011   Peripheral vascular disease 2011   venous stasis    Personal history of radiation therapy    PONV  (postoperative nausea and vomiting)    Post-polio syndrome    Radiation 2008   BREAST CA   Sleep apnea    Tremor    Unspecified musculoskeletal disorders and symptoms referable to neck    cervical/trapezius   Vitamin D deficiency    Past Surgical History:  Procedure Laterality Date   ABDOMINAL HYSTERECTOMY  1997   ANTERIOR CERVICAL DECOMP/DISCECTOMY FUSION N/A 02/01/2019   Procedure: Anterior Cervical Decompression Fusion Cervical three-four, Cervical four-five, hardware removal Cervical five-six;  Surgeon: Julio Sicks, MD;  Location: [facility] OR;  Service: Neurosurgery;  Laterality: N/A;   BREAST LUMPECTOMY Right 2008   4 cm area of DCIS, margins less than 1 mm. Treated with wide excision, whole breast radiation   BREAST SURGERY Right 12/04/2009   Right simple mastectomy, sentinel node biopsy.   CARPAL TUNNEL RELEASE     COLONOSCOPY  2010,2015   Dr. Servando Snare, Dr. Mechele Collin   COLONOSCOPY     COLONOSCOPY N/A 06/18/2022   Procedure: COLONOSCOPY;  Surgeon: Jaynie Collins, DO;  Location: [facility] ENDOSCOPY;  Service:  Gastroenterology;  Laterality: N/A;   COLONOSCOPY WITH PROPOFOL N/A 05/23/2017   Procedure: COLONOSCOPY WITH PROPOFOL;  Surgeon: Scot Jun, MD;  Location: [facility] ENDOSCOPY;  Service: Endoscopy;  Laterality: N/A;   ELBOW SURGERY Left 2011   ELBOW SURGERY Left 2015   ESOPHAGOGASTRODUODENOSCOPY     ESOPHAGOGASTRODUODENOSCOPY N/A 08/20/2021   Procedure: ESOPHAGOGASTRODUODENOSCOPY (EGD);  Surgeon: Jaynie Collins, DO;  Location: [facility] ENDOSCOPY;  Service: Gastroenterology;  Laterality: N/A;   FLEXIBLE SIGMOIDOSCOPY  07/19/2000   HIP SURGERY Right 06/19/2012   LEG SURGERY Right 1958   Corrcetive surgery for polio   LEG SURGERY Left 1958   corrective surgery for polio   MASTECTOMY Right 2011   BREAST CA   rectocele/enterocelle repair and perinoplasty     SHOULDER ARTHROSCOPY W/ ROTATOR CUFF REPAIR Bilateral 2010,2012   SHOULDER ARTHROSCOPY WITH OPEN ROTATOR CUFF REPAIR  Left 08/19/2015   Procedure: SHOULDER ARTHROSCOPY WITH  MINI OPEN ROTATOR CUFF REPAIR,SUBACROMIAL DECOMPRESSION, ARTHROSCOPIC BICEPS TENODESIS;  Surgeon: Juanell Fairly, MD;  Location: [facility] ORS;  Service: Orthopedics;  Laterality: Left;   SHOULDER SURGERY Left 2014   SPINE SURGERY     TOE FUSION Left 1970   little toe fusion   TONSILLECTOMY     TUBAL LIGATION     two left foot surgeries:polio related     Patient Active Problem List   Diagnosis Date Noted   Primary hypertension 07/28/2021   Insomnia 07/28/2021   History of right mastectomy 06/24/2021   Pneumonia 03/03/2021   Respiratory failure with hypoxia 03/03/2021   COPD with acute exacerbation 03/03/2021   Wheelchair dependence 10/09/2019   Cervical radiculitis 04/04/2019   Cervical spondylosis with myelopathy and radiculopathy 02/01/2019   B12 deficiency 07/29/2018   Osteoarthritis of hip 09/29/2017   Ataxia 04/15/2017   Loss of memory 04/15/2017   Cardiac murmur 03/04/2017   Hx of adenomatous colonic polyps 12/10/2016   Tremor 08/11/2016   Personal history of tobacco use, presenting hazards to health 06/13/2016   Falls frequently 08/05/2014   Personal history of malignant neoplasm of breast 07/26/2014   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  Rationale for Evaluation and Treatment Rehabilitation  PERTINENT HISTORY: Pt had a revision of her left total shoulder arthroplasty on March 2nd, because her hardware came loose after fall. She initially had a left total shoulder arthoplasty on  June 22nd.  She has had a history   PRECAUTIONS: No shoulder AROM, No Shoulder AAROM, No Shoulder PROM into IR , No reaching behind back especially into IR, No lifting objects, No supporting body weight with hands, place pillow under arm to avoid hyperextension of left arm   SUBJECTIVE:  SUBJECTIVE STATEMENT:  Pt reports that she as concerned about bump she felt on her left shoulder blade after working on her tablet. She heard a pop accompanied with pain and she is worried that bump resulted from incident. Pt reports all exercises without much difficulty.    PAIN:  Are you having pain? Yes: NPRS scale: 4/10 Pain location: Left Shoulder joint  Pain description: Achy  Aggravating factors: Moving shoulder  Relieving factors: Keeping shoulder still    OBJECTIVE: (objective measures completed at initial evaluation unless otherwise dated)   VITALS: BP 103/43 SpO2 95% HR 74    DIAGNOSTIC FINDINGS:  [facility] so not listed in notes    PATIENT SURVEYS:  FOTO 19/100 with target 46    COGNITION: Overall cognitive status: Within functional limits for tasks assessed                                  SENSATION: WFL   POSTURE: Forward head and rounded shoulders    UPPER EXTREMITY ROM:    Active ROM Right eval Left eval  Shoulder flexion 160 100  Shoulder extension      Shoulder abduction 100 NT   Shoulder adduction      Shoulder internal rotation      Shoulder external rotation      Elbow flexion      Elbow extension      Wrist flexion      Wrist extension      Wrist ulnar deviation      Wrist radial deviation      Wrist pronation      Wrist supination      (Blank rows = not tested)   UPPER EXTREMITY MMT:   MMT Right eval Left eval  Shoulder flexion NT NT  Shoulder extension NT NT    Shoulder abduction NT NT   Shoulder adduction      Shoulder internal rotation NT NT   Shoulder external rotation NT NT  Middle trapezius NT NT  Lower trapezius NT NT  Elbow flexion      Elbow extension      Wrist flexion      Wrist extension      Wrist ulnar deviation      Wrist radial deviation      Wrist pronation      Wrist supination      Grip strength (lbs)      (Blank rows = not tested)     JOINT MOBILITY TESTING:  NT    PALPATION:  Anterior left shoulder              TODAY'S TREATMENT:  DATE:    08/18/22: All exercises performed on LUE  Instructed pt about concern about bony prominence on left shoulder that she thought was damage to left shoulder blade was actually spine of scapula.  Upper Trap Stretch 3 x 30 sec   Seated GH Flexion/Extension Table Slide with pillow case 2 x 10  Seated GH Abduction/Adduction Table Slide with slider 2 x 10  Supine Shoulder ER/IR to 30 degrees in place of 30 deg scaption 3 x 10   08/12/22 Pendulums on RUE Flexion and Extension 3 x 10  Pendulums on RUE Horizontal 3 x 10  Pendulums Circles on RUE 3 x 10  Supine Right Shoulder PROM Flex 100 degrees x 2    PATIENT EDUCATION: Education details: form and technique for correct exercise and precautions from surgical protocol provided to pt  Person educated: Patient Education method: Programmer, multimedia, Demonstration, Verbal cues, and Handouts Education comprehension: verbalized understanding, returned demonstration, and verbal cues required   HOME EXERCISE PROGRAM: Access Code: [email or web address] URL: [email or web address] Date: 08/12/2022 Prepared by: Ellin Goodie   Exercises - Supported Elbow Flexion Extension PROM  - 1 x daily - 3 sets - 10 reps - Horizontal Shoulder Pendulum with Table Support  - 1 x daily - 3 sets - 10 reps - Flexion-Extension  Shoulder Pendulum with Table Support  - 1 x daily - 3 sets - 10 reps - Circular Shoulder Pendulum with Table Support  - 1 x daily - 3 sets - 10 reps - Supine Shoulder Flexion PROM  - 1 x daily - 3 sets - 10 reps   ASSESSMENT:   CLINICAL IMPRESSION: Pt presents for initial treatment after initial eval and 2 weeks left reverse total shoulder. She shows improvement in shoulder PROM with no increase in her symptoms. She requires min VC with excellent follow through. She is highly motivated and she will continue to benefit from skilled PT to address these aforementioned deficits to return to restore left UE function to perform ADL tasks with increased ease without pain.      OBJECTIVE IMPAIRMENTS: decreased ROM, decreased strength, impaired flexibility, impaired UE functional use, and pain.    ACTIVITY LIMITATIONS: carrying, lifting, bathing, toileting, dressing, reach over head, and hygiene/grooming   PARTICIPATION LIMITATIONS: meal prep, cleaning, laundry, shopping, and yard work   PERSONAL FACTORS: Age, Past/current experiences, Time since onset of injury/illness/exacerbation, and 3+ comorbidities: COPD, Depression, Post Polio Syndrome  are also affecting patient's functional outcome.    REHAB POTENTIAL: Fair chronicity of issue and high number of co-morbidities    CLINICAL DECISION MAKING: Stable/uncomplicated   EVALUATION COMPLEXITY: Low     GOALS: Goals reviewed with patient? No   SHORT TERM GOALS: Target date: 08/26/2022     Pt will be independent with HEP in order to improve strength and balance in order to decrease fall risk and improve function at home and work. Baseline: NT  Goal status: INITIAL   2.  Patient will be able to stop wearing sling within 3 weeks as sign of shoulder healing.  Baseline: Wearing sling  Goal status: INITIAL     LONG TERM GOALS: Target date: 10/21/2022   Patient will have improved function and activity level as evidenced by an increase in FOTO  score by 10 points or more.  Baseline: 19/100 target 46 Goal status: INITIAL   2.  Patient will improve left shoulder ROM to be near or greater than ROM of right shoulder to be able to perform overhead tasks for ADLs. Baseline:  Shoulder Flex R/L 100/160, Shoulder Abduction R/L 100/ NT, Shoulder ER NT, Shoulder IR NT, Shoulder Ext R/L  Goal status: INITIAL    3.  Patient will improve left shoulder strength to be near or greater than right shoulder to perform overhead tasks for improve UE function.  Baseline: NT Goal status: INITIAL     PLAN:   PT FREQUENCY: 1-2x/week   PT DURATION: 10 weeks   PLANNED INTERVENTIONS: Therapeutic exercises, Neuromuscular re-education, Joint mobilization, Joint manipulation, DME instructions, Dry Needling, Electrical stimulation, Spinal manipulation, Spinal mobilization, Cryotherapy, Moist heat, Manual therapy, and Re-evaluation   PLAN FOR NEXT SESSION: Supine Shoulder PROM Flexion/Extension to 120 degrees and Abduction to 90 degrees     Ellin Goodie PT, DPT  08/18/2022, 4:34 PM

## 2022-08-23 ENCOUNTER — Ambulatory Visit: Payer: Medicare Other

## 2022-08-24 ENCOUNTER — Ambulatory Visit: Payer: Medicare Other | Admitting: Physical Therapy

## 2022-08-26 ENCOUNTER — Ambulatory Visit: Payer: Medicare Other | Admitting: Physical Therapy

## 2022-08-26 DIAGNOSIS — Z96612 Presence of left artificial shoulder joint: Secondary | ICD-10-CM

## 2022-08-26 DIAGNOSIS — M25512 Pain in left shoulder: Secondary | ICD-10-CM | POA: Diagnosis not present

## 2022-08-26 NOTE — Therapy (Signed)
OUTPATIENT PHYSICAL THERAPY TREATMENT NOTE   Patient Name: Andrea [name] MRN: [id number] DOB:[birth year], 72 y.o., female Today's Date: 08/26/2022  PCP: Dr. Letitia Libra REFERRING PROVIDER: Dr. Cassell Smiles   END OF SESSION:   PT End of Session - 08/26/22 1211     Visit Number 3    Number of Visits 20    Date for PT Re-Evaluation 10/21/22    Authorization Type Medicare    Authorization Time Period 08/12/22-10/21/22    Authorization - Visit Number 3    Authorization - Number of Visits 20    Progress Note Due on Visit 10    PT Start Time 1100    PT Stop Time 1145    PT Time Calculation (min) 45 min    Activity Tolerance Patient tolerated treatment well    Behavior During Therapy [facility] for tasks assessed/performed              Past Medical History:  Diagnosis Date   Allergic rhinitis due to allergen    Allergy    Breast cancer 2008   RT LUMPECTOMY   Breast cancer 2011   RT MASTECTOMY   Cancer 2008,2011     High-grade DCIS,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   Cervical post-laminectomy syndrome    Chronic constipation    Colon polyp 2010   Complication of anesthesia    itching real bad or msucles spasms   COPD (chronic obstructive pulmonary disease) 02/21/2019   Cubital tunnel syndrome    Depression    Disorders of sacrum    Disturbance of skin sensation    Emphysema lung 2020   GERD (gastroesophageal reflux disease)    Heart murmur    History of abnormal cervical Pap smear    History of cataract    History of colon polyps    History of pneumonia 2008   Hot flashes    Hx of diplopia    right eye   Hyperlipidemia    Hypertension    Insomnia    Late effects of acute poliomyelitis    Lateral epicondylitis  of elbow    Loss of memory    Menopausal state    Neck pain    Osteoporosis    Pain in joint of left shoulder    Parkinson's disease 2011   Peripheral vascular disease 2011   venous stasis    Personal history of radiation therapy    PONV  (postoperative nausea and vomiting)    Post-polio syndrome    Radiation 2008   BREAST CA   Sleep apnea    Tremor    Unspecified musculoskeletal disorders and symptoms referable to neck    cervical/trapezius   Vitamin D deficiency    Past Surgical History:  Procedure Laterality Date   ABDOMINAL HYSTERECTOMY  1997   ANTERIOR CERVICAL DECOMP/DISCECTOMY FUSION N/A 02/01/2019   Procedure: Anterior Cervical Decompression Fusion Cervical three-four, Cervical four-five, hardware removal Cervical five-six;  Surgeon: Julio Sicks, MD;  Location: [facility] OR;  Service: Neurosurgery;  Laterality: N/A;   BREAST LUMPECTOMY Right 2008   4 cm area of DCIS, margins less than 1 mm. Treated with wide excision, whole breast radiation   BREAST SURGERY Right 12/04/2009   Right simple mastectomy, sentinel node biopsy.   CARPAL TUNNEL RELEASE     COLONOSCOPY  2010,2015   Dr. Servando Snare, Dr. Mechele Collin   COLONOSCOPY     COLONOSCOPY N/A 06/18/2022   Procedure: COLONOSCOPY;  Surgeon: Jaynie Collins, DO;  Location: [facility] ENDOSCOPY;  Service: Gastroenterology;  Laterality: N/A;   COLONOSCOPY WITH PROPOFOL N/A 05/23/2017   Procedure: COLONOSCOPY WITH PROPOFOL;  Surgeon: Scot Jun, MD;  Location: [facility] ENDOSCOPY;  Service: Endoscopy;  Laterality: N/A;   ELBOW SURGERY Left 2011   ELBOW SURGERY Left 2015   ESOPHAGOGASTRODUODENOSCOPY     ESOPHAGOGASTRODUODENOSCOPY N/A 08/20/2021   Procedure: ESOPHAGOGASTRODUODENOSCOPY (EGD);  Surgeon: Jaynie Collins, DO;  Location: [facility] ENDOSCOPY;  Service: Gastroenterology;  Laterality: N/A;   FLEXIBLE SIGMOIDOSCOPY  07/19/2000   HIP SURGERY Right 06/19/2012   LEG SURGERY Right 1958   Corrcetive surgery for polio   LEG SURGERY Left 1958   corrective surgery for polio   MASTECTOMY Right 2011   BREAST CA   rectocele/enterocelle repair and perinoplasty     SHOULDER ARTHROSCOPY W/ ROTATOR CUFF REPAIR Bilateral 2010,2012   SHOULDER ARTHROSCOPY WITH OPEN ROTATOR CUFF REPAIR  Left 08/19/2015   Procedure: SHOULDER ARTHROSCOPY WITH  MINI OPEN ROTATOR CUFF REPAIR,SUBACROMIAL DECOMPRESSION, ARTHROSCOPIC BICEPS TENODESIS;  Surgeon: Juanell Fairly, MD;  Location: [facility] ORS;  Service: Orthopedics;  Laterality: Left;   SHOULDER SURGERY Left 2014   SPINE SURGERY     TOE FUSION Left 1970   little toe fusion   TONSILLECTOMY     TUBAL LIGATION     two left foot surgeries:polio related     Patient Active Problem List   Diagnosis Date Noted   Primary hypertension 07/28/2021   Insomnia 07/28/2021   History of right mastectomy 06/24/2021   Pneumonia 03/03/2021   Respiratory failure with hypoxia 03/03/2021   COPD with acute exacerbation 03/03/2021   Wheelchair dependence 10/09/2019   Cervical radiculitis 04/04/2019   Cervical spondylosis with myelopathy and radiculopathy 02/01/2019   B12 deficiency 07/29/2018   Osteoarthritis of hip 09/29/2017   Ataxia 04/15/2017   Loss of memory 04/15/2017   Cardiac murmur 03/04/2017   Hx of adenomatous colonic polyps 12/10/2016   Tremor 08/11/2016   Personal history of tobacco use, presenting hazards to health 06/13/2016   Falls frequently 08/05/2014   Personal history of malignant neoplasm of breast 07/26/2014   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   Lumbosacral spondylosis without myelopathy 04/30/2013   Subacute lumbar radiculopathy 04/30/2013   Chronic low back pain 02/19/2013   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 She initially had a left total shoulder arthoplasty on June 22nd.  She has had a history   PRECAUTIONS: No shoulder AROM, No Shoulder AAROM, No Shoulder PROM into IR , No reaching behind back especially into IR, No lifting objects, No supporting body weight with hands, place pillow under arm to avoid hyperextension of left arm   SUBJECTIVE:  SUBJECTIVE STATEMENT:  Pt reports having increased pain in the muscle belly of her left bicep. She had a similar sensation in her right bicep after last surgery and she said that her physicians found that her right shoulder was not healing well. She reports exercises are going well.    PAIN:  Are you having pain? Yes: NPRS scale: 6-7/10 Pain location: Left Bicep Muscle Belly   Pain description: Achy  Aggravating factors: Moving shoulder  Relieving factors: Keeping shoulder still    OBJECTIVE: (objective measures completed at initial evaluation unless otherwise dated)   VITALS: BP 103/43 SpO2 95% HR 74    DIAGNOSTIC FINDINGS:  [facility] so not listed in notes    PATIENT SURVEYS:  FOTO 19/100 with target 46    COGNITION: Overall cognitive status: Within functional limits for tasks assessed                                  SENSATION: WFL   POSTURE: Forward head and rounded shoulders    UPPER EXTREMITY ROM:    Active ROM Right eval Left eval  Shoulder flexion 160 100  Shoulder extension      Shoulder abduction 100 NT   Shoulder adduction      Shoulder internal rotation      Shoulder external rotation      Elbow flexion      Elbow extension      Wrist flexion      Wrist extension      Wrist ulnar deviation      Wrist radial deviation      Wrist pronation      Wrist supination      (Blank rows = not tested)   UPPER EXTREMITY MMT:   MMT  Right eval Left eval  Shoulder flexion NT NT  Shoulder extension NT NT   Shoulder abduction NT NT   Shoulder adduction      Shoulder internal rotation NT NT   Shoulder external rotation NT NT  Middle trapezius NT NT  Lower trapezius NT NT  Elbow flexion      Elbow extension      Wrist flexion      Wrist extension      Wrist ulnar deviation      Wrist radial deviation      Wrist pronation      Wrist supination      Grip strength (lbs)      (Blank rows = not tested)     JOINT MOBILITY TESTING:  NT    PALPATION:  Anterior left shoulder              TODAY'S TREATMENT:  DATE:    08/26/22: All exercises performed on LUE  Shoulder Scaption to 120 degrees AAROM 3 x 15 Shoulder Flexion/Extension to 120 degrees AAROM 3 x 15  Shoulder Flexion Isometric 5 sec hold 1 x 10  Shoulder ER Isometric 5 sec hold 1 x 10  Shoulder IR Isometric 5 sec hold 1 x 10   Palpation of left muscle belly of biceps: TTP especially closer to insertion where there is still bruising from surgery  -Advised perform elbow extension stretch 3 x 30 sec with left arm resting on below  08/18/22: All exercises performed on LUE  Instructed pt about concern about bony prominence on left shoulder that she thought was damage to left shoulder blade was actually spine of scapula.  Upper Trap Stretch 3 x 30 sec   Seated GH Flexion/Extension Table Slide with pillow case 2 x 10  Seated GH Abduction/Adduction Table Slide with slider 2 x 10  Supine Shoulder ER/IR to 30 degrees in place of 30 deg scaption 3 x 10   08/12/22 Pendulums on RUE Flexion and Extension 3 x 10  Pendulums on RUE Horizontal 3 x 10  Pendulums Circles on RUE 3 x 10  Supine Right Shoulder PROM Flex 100 degrees x 2    PATIENT EDUCATION: Education details: form and technique for correct exercise and precautions from  surgical protocol provided to pt  Person educated: Patient Education method: Programmer, multimedia, Demonstration, Verbal cues, and Handouts Education comprehension: verbalized understanding, returned demonstration, and verbal cues required   HOME EXERCISE PROGRAM: Access Code: [email or web address] URL: [email or web address] Date: 08/12/2022 Prepared by: Ellin Goodie   Exercises - Supported Elbow Flexion Extension PROM  - 1 x daily - 3 sets - 10 reps - Horizontal Shoulder Pendulum with Table Support  - 1 x daily - 3 sets - 10 reps - Flexion-Extension Shoulder Pendulum with Table Support  - 1 x daily - 3 sets - 10 reps - Circular Shoulder Pendulum with Table Support  - 1 x daily - 3 sets - 10 reps - Supine Shoulder Flexion PROM  - 1 x daily - 3 sets - 10 reps   ASSESSMENT:   CLINICAL IMPRESSION: Pt presents for initial treatment after initial eval and 3 weeks left reverse total shoulder. She is now entering intermediate phase of recovery with improved left shoulder ROM and strength with ability to perform AAROM exercises for the first time without pain or discomfort. PT recommends that patient hold off until 6 weeks on using loftstrand crutches until she can perform AROM exercises. She continues to be highly motivated and she will continue to benefit from skilled PT to address these aforementioned deficits to return to restore left UE function to perform ADL tasks with increased ease without pain.    OBJECTIVE IMPAIRMENTS: decreased ROM, decreased strength, impaired flexibility, impaired UE functional use, and pain.    ACTIVITY LIMITATIONS: carrying, lifting, bathing, toileting, dressing, reach over head, and hygiene/grooming   PARTICIPATION LIMITATIONS: meal prep, cleaning, laundry, shopping, and yard work   PERSONAL FACTORS: Age, Past/current experiences, Time since onset of injury/illness/exacerbation, and 3+ comorbidities: COPD, Depression, Post Polio Syndrome  are also affecting patient's  functional outcome.    REHAB POTENTIAL: Fair chronicity of issue and high number of co-morbidities    CLINICAL DECISION MAKING: Stable/uncomplicated   EVALUATION COMPLEXITY: Low     GOALS: Goals reviewed with patient? No   SHORT TERM GOALS: Target date: 08/26/2022     Pt will be independent with HEP in order to improve  strength and balance in order to decrease fall risk and improve function at home and work. Baseline: Unable to perform independently   Goal status: Ongoing    2.  Patient will be able to stop wearing sling within 3 weeks as sign of shoulder healing.  Baseline: Wearing sling  Goal status: Ongoing      LONG TERM GOALS: Target date: 10/21/2022   Patient will have improved function and activity level as evidenced by an increase in FOTO score by 10 points or more.  Baseline: 19/100 target 46 Goal status: Ongoing    2.  Patient will improve left shoulder ROM to be near or greater than ROM of right shoulder to be able to perform overhead tasks for ADLs. Baseline: Shoulder Flex R/L 100/160, Shoulder Abduction R/L 100/ NT, Shoulder ER NT, Shoulder IR NT, Shoulder Ext R/L  Goal status: Ongoing    3.  Patient will improve left shoulder strength to be near or greater than right shoulder to perform overhead tasks for improve UE function.  Baseline: NT Goal status: Ongoing      PLAN:   PT FREQUENCY: 1-2x/week   PT DURATION: 10 weeks   PLANNED INTERVENTIONS: Therapeutic exercises, Neuromuscular re-education, Joint mobilization, Joint manipulation, DME instructions, Dry Needling, Electrical stimulation, Spinal manipulation, Spinal mobilization, Cryotherapy, Moist heat, Manual therapy, and Re-evaluation   PLAN FOR NEXT SESSION: ER in position of scaption (POS), continue with isometrics and AAROM pulleys      Ellin Goodie PT, DPT  08/26/2022, 12:12 PM

## 2022-08-30 ENCOUNTER — Ambulatory Visit: Payer: Medicare Other | Admitting: Physical Therapy

## 2022-08-30 DIAGNOSIS — M25512 Pain in left shoulder: Secondary | ICD-10-CM

## 2022-08-30 DIAGNOSIS — Z96612 Presence of left artificial shoulder joint: Secondary | ICD-10-CM

## 2022-08-30 NOTE — Therapy (Signed)
OUTPATIENT PHYSICAL THERAPY TREATMENT NOTE   Patient Name: Andrea [name] MRN: [id number] DOB:12/09/1950, 72 y.o., female Today's Date: 08/30/2022  PCP: Dr. Letitia Libra REFERRING PROVIDER: Dr. Cassell Smiles   END OF SESSION:   PT End of Session - 08/30/22 1256     Visit Number 4    Number of Visits 20    Date for PT Re-Evaluation 10/21/22    Authorization Type Medicare    Authorization Time Period 08/12/22-10/21/22    Authorization - Visit Number 4    Authorization - Number of Visits 20    Progress Note Due on Visit 10    PT Start Time 1250    PT Stop Time 1330    PT Time Calculation (min) 40 min    Activity Tolerance Patient tolerated treatment well    Behavior During Therapy [facility] for tasks assessed/performed              Past Medical History:  Diagnosis Date   Allergic rhinitis due to allergen    Allergy    Breast cancer (HCC) 2008   RT LUMPECTOMY   Breast cancer (HCC) 2011   RT MASTECTOMY   Cancer (HCC) 4098,1191     High-grade DCIS,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   Cervical post-laminectomy syndrome    Chronic constipation    Colon polyp 2010   Complication of anesthesia    itching real bad or msucles spasms   COPD (chronic obstructive pulmonary disease) (HCC) 02/21/2019   Cubital tunnel syndrome    Depression    Disorders of sacrum    Disturbance of skin sensation    Emphysema lung (HCC) 2020   GERD (gastroesophageal reflux disease)    Heart murmur    History of abnormal cervical Pap smear    History of cataract    History of colon polyps    History of pneumonia 2008   Hot flashes    Hx of diplopia    right eye   Hyperlipidemia    Hypertension    Insomnia    Late effects of acute poliomyelitis    Lateral epicondylitis  of elbow    Loss of memory    Menopausal state    Neck pain    Osteoporosis    Pain in joint of left shoulder    Parkinson's disease 2011   Peripheral vascular disease (HCC) 2011   venous stasis    Personal history of  radiation therapy    PONV (postoperative nausea and vomiting)    Post-polio syndrome    Radiation 2008   BREAST CA   Sleep apnea    Tremor    Unspecified musculoskeletal disorders and symptoms referable to neck    cervical/trapezius   Vitamin D deficiency    Past Surgical History:  Procedure Laterality Date   ABDOMINAL HYSTERECTOMY  1997   ANTERIOR CERVICAL DECOMP/DISCECTOMY FUSION N/A 02/01/2019   Procedure: Anterior Cervical Decompression Fusion Cervical three-four, Cervical four-five, hardware removal Cervical five-six;  Surgeon: Julio Sicks, MD;  Location: [facility] OR;  Service: Neurosurgery;  Laterality: N/A;   BREAST LUMPECTOMY Right 2008   4 cm area of DCIS, margins less than 1 mm. Treated with wide excision, whole breast radiation   BREAST SURGERY Right 12/04/2009   Right simple mastectomy, sentinel node biopsy.   CARPAL TUNNEL RELEASE     COLONOSCOPY  2010,2015   Dr. Servando Snare, Dr. Mechele Collin   COLONOSCOPY     COLONOSCOPY N/A 06/18/2022   Procedure: COLONOSCOPY;  Surgeon: Jaynie Collins,  DO;  Location: [facility] ENDOSCOPY;  Service: Gastroenterology;  Laterality: N/A;   COLONOSCOPY WITH PROPOFOL N/A 05/23/2017   Procedure: COLONOSCOPY WITH PROPOFOL;  Surgeon: Scot Jun, MD;  Location: [facility] ENDOSCOPY;  Service: Endoscopy;  Laterality: N/A;   ELBOW SURGERY Left 2011   ELBOW SURGERY Left 2015   ESOPHAGOGASTRODUODENOSCOPY     ESOPHAGOGASTRODUODENOSCOPY N/A 08/20/2021   Procedure: ESOPHAGOGASTRODUODENOSCOPY (EGD);  Surgeon: Jaynie Collins, DO;  Location: [facility] ENDOSCOPY;  Service: Gastroenterology;  Laterality: N/A;   FLEXIBLE SIGMOIDOSCOPY  07/19/2000   HIP SURGERY Right 06/19/2012   LEG SURGERY Right 1958   Corrcetive surgery for polio   LEG SURGERY Left 1958   corrective surgery for polio   MASTECTOMY Right 2011   BREAST CA   rectocele/enterocelle repair and perinoplasty     SHOULDER ARTHROSCOPY W/ ROTATOR CUFF REPAIR Bilateral 2010,2012   SHOULDER ARTHROSCOPY WITH  OPEN ROTATOR CUFF REPAIR Left 08/19/2015   Procedure: SHOULDER ARTHROSCOPY WITH  MINI OPEN ROTATOR CUFF REPAIR,SUBACROMIAL DECOMPRESSION, ARTHROSCOPIC BICEPS TENODESIS;  Surgeon: Juanell Fairly, MD;  Location: [facility] ORS;  Service: Orthopedics;  Laterality: Left;   SHOULDER SURGERY Left 2014   SPINE SURGERY     TOE FUSION Left 1970   little toe fusion   TONSILLECTOMY     TUBAL LIGATION     two left foot surgeries:polio related     Patient Active Problem List   Diagnosis Date Noted   Primary hypertension 07/28/2021   Insomnia 07/28/2021   History of right mastectomy 06/24/2021   Pneumonia 03/03/2021   Respiratory failure with hypoxia (HCC) 03/03/2021   COPD with acute exacerbation (HCC) 03/03/2021   Wheelchair dependence 10/09/2019   Cervical radiculitis 04/04/2019   Cervical spondylosis with myelopathy and radiculopathy 02/01/2019   B12 deficiency 07/29/2018   Osteoarthritis of hip 09/29/2017   Ataxia 04/15/2017   Loss of memory 04/15/2017   Cardiac murmur 03/04/2017   Hx of adenomatous colonic polyps 12/10/2016   Tremor 08/11/2016   Personal history of tobacco use, presenting hazards to health 06/13/2016   Falls frequently 08/05/2014   Personal history of malignant neoplasm of breast 07/26/2014   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   Lumbosacral spondylosis without myelopathy 04/30/2013   Subacute lumbar radiculopathy 04/30/2013   Chronic low back pain 02/19/2013   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 She initially had a left total shoulder arthoplasty on June 22nd.  She has had a history   PRECAUTIONS: No shoulder AROM, No Shoulder AAROM, No Shoulder PROM into IR , No reaching behind back especially into IR, No lifting objects, No supporting body weight with hands, place pillow under arm to avoid hyperextension of left arm   SUBJECTIVE:  SUBJECTIVE STATEMENT:  Pt reports that she has been able to all exercises without difficulty and she still feels that pain in her left biceps.    PAIN:  Are you having pain? Yes: NPRS scale: 4/10 Pain location: Left Bicep Muscle Belly   Pain description: Achy  Aggravating factors: Moving shoulder  Relieving factors: Keeping shoulder still    OBJECTIVE: (objective measures completed at initial evaluation unless otherwise dated)   VITALS: BP 103/43 SpO2 95% HR 74    DIAGNOSTIC FINDINGS:  [facility] so not listed in notes    PATIENT SURVEYS:  FOTO 19/100 with target 46    COGNITION: Overall cognitive status: Within functional limits for tasks assessed                                  SENSATION: WFL   POSTURE: Forward head and rounded shoulders    UPPER EXTREMITY ROM:    Active ROM Right eval Left eval  Shoulder flexion 160 100  Shoulder extension      Shoulder abduction 100 NT   Shoulder adduction      Shoulder internal rotation      Shoulder external rotation      Elbow flexion      Elbow extension      Wrist flexion      Wrist extension      Wrist ulnar deviation      Wrist radial deviation      Wrist pronation      Wrist supination      (Blank rows = not tested)   UPPER EXTREMITY MMT:   MMT Right eval Left eval  Shoulder flexion NT NT  Shoulder extension NT NT   Shoulder abduction NT NT    Shoulder adduction      Shoulder internal rotation NT NT   Shoulder external rotation NT NT  Middle trapezius NT NT  Lower trapezius NT NT  Elbow flexion      Elbow extension      Wrist flexion      Wrist extension      Wrist ulnar deviation      Wrist radial deviation      Wrist pronation      Wrist supination      Grip strength (lbs)      (Blank rows = not tested)     JOINT MOBILITY TESTING:  NT    PALPATION:  Anterior left shoulder              TODAY'S TREATMENT:                                                                                                                                         DATE:    08/30/22: All exercises performed on LUE Shoulder Scaption to 120 degrees AAROM 3 x  15 Shoulder Flexion/Extension to 120 degrees AAROM 3 x 15  Supine Shoulder Flexion/Extension to 120 degrees AAROM 3 x 15    08/26/22: All exercises performed on LUE  Shoulder Scaption to 120 degrees AAROM 3 x 15 Shoulder Flexion/Extension to 120 degrees AAROM 3 x 15  Shoulder Flexion Isometric 5 sec hold 1 x 10  Shoulder ER Isometric 5 sec hold 1 x 10  Shoulder IR Isometric 5 sec hold 1 x 10   Palpation of left muscle belly of biceps: TTP especially closer to insertion where there is still bruising from surgery  -Advised perform elbow extension stretch 3 x 30 sec with left arm resting on below  08/18/22: All exercises performed on LUE  Instructed pt about concern about bony prominence on left shoulder that she thought was damage to left shoulder blade was actually spine of scapula.  Upper Trap Stretch 3 x 30 sec   Seated GH Flexion/Extension Table Slide with pillow case 2 x 10  Seated GH Abduction/Adduction Table Slide with slider 2 x 10  Supine Shoulder ER/IR to 30 degrees in place of 30 deg scaption 3 x 10   08/12/22 Pendulums on RUE Flexion and Extension 3 x 10  Pendulums on RUE Horizontal 3 x 10  Pendulums Circles on RUE 3 x 10  Supine Right Shoulder PROM Flex 100 degrees x 2     PATIENT EDUCATION: Education details: form and technique for correct exercise and precautions from surgical protocol provided to pt  Person educated: Patient Education method: Programmer, multimedia, Demonstration, Verbal cues, and Handouts Education comprehension: verbalized understanding, returned demonstration, and verbal cues required   HOME EXERCISE PROGRAM: Access Code: [email or web address] URL: [email or web address] Date: 08/30/2022 Prepared by: Ellin Goodie  Exercises - Seated Upper Trapezius Stretch  - 1 x daily - 3 reps - 30-60 sec hold - Seated Shoulder Flexion AAROM with Pulley Behind  - 7 x weekly - 3 sets - 15 reps - Seated Shoulder Scaption AAROM with Pulley at Side  - 1 x daily - 7 x weekly - 3 sets - 10 reps - Shoulder Abduction Towel Slide on Railing   - 1 x daily - 3 sets - 10 reps - Shoulder External Rotation and Internal Rotation PROM and use other hand to move left arm   - 1 x daily - 3 sets - 10 reps - Isometric Shoulder Flexion at Wall  - 3-4 x weekly - 3 sets - 10 reps - 5 sec hold hold - Isometric Shoulder External Rotation at Wall  - 3-4 x weekly - 3 sets - 10 reps - 5 sec hold hold - Isometric Shoulder Abduction at Wall  - 3-4 x weekly - 3 sets - 10 reps - 5 sec  hold - Standing Isometric Shoulder Internal Rotation at Doorway  - 3-4 x weekly - 3 sets - 10 reps - 5 sec  hold - Scare the Bear   - 3 x weekly - 3 sets - 10 reps - Supine Shoulder External Internal Rotation AAROM with Dowel  - 1 x daily - 3 sets - 15 reps   ASSESSMENT:   CLINICAL IMPRESSION: Pt presents for initial treatment after initial eval and 4 weeks left reverse total shoulder. She continues to show improvement with PROM with ability to tolerate AAROM external rotation in plane of scaption and flexion and extension AAROM without an increase in her left shoulder pain. Cancelled next apt due to same exercises being done because not able to progress to AROM until  week 6. She will continue to benefit  from skilled PT to address these aforementioned deficits to return to restore left UE function to perform ADL tasks with increased ease without pain.    OBJECTIVE IMPAIRMENTS: decreased ROM, decreased strength, impaired flexibility, impaired UE functional use, and pain.    ACTIVITY LIMITATIONS: carrying, lifting, bathing, toileting, dressing, reach over head, and hygiene/grooming   PARTICIPATION LIMITATIONS: meal prep, cleaning, laundry, shopping, and yard work   PERSONAL FACTORS: Age, Past/current experiences, Time since onset of injury/illness/exacerbation, and 3+ comorbidities: COPD, Depression, Post Polio Syndrome  are also affecting patient's functional outcome.    REHAB POTENTIAL: Fair chronicity of issue and high number of co-morbidities    CLINICAL DECISION MAKING: Stable/uncomplicated   EVALUATION COMPLEXITY: Low     GOALS: Goals reviewed with patient? No   SHORT TERM GOALS: Target date: 08/26/2022     Pt will be independent with HEP in order to improve strength and balance in order to decrease fall risk and improve function at home and work. Baseline: Unable to perform independently   Goal status: Ongoing    2.  Patient will be able to stop wearing sling within 3 weeks as sign of shoulder healing.  Baseline: Wearing sling  Goal status: Ongoing      LONG TERM GOALS: Target date: 10/21/2022   Patient will have improved function and activity level as evidenced by an increase in FOTO score by 10 points or more.  Baseline: 19/100 target 46 Goal status: Ongoing    2.  Patient will improve left shoulder ROM to be near or greater than ROM of right shoulder to be able to perform overhead tasks for ADLs. Baseline: Shoulder Flex R/L 100/160, Shoulder Abduction R/L 100/ NT, Shoulder ER NT, Shoulder IR NT, Shoulder Ext R/L  Goal status: Ongoing    3.  Patient will improve left shoulder strength to be near or greater than right shoulder to perform overhead tasks for improve UE  function.  Baseline: NT Goal status: Ongoing      PLAN:   PT FREQUENCY: 1-2x/week   PT DURATION: 10 weeks   PLANNED INTERVENTIONS: Therapeutic exercises, Neuromuscular re-education, Joint mobilization, Joint manipulation, DME instructions, Dry Needling, Electrical stimulation, Spinal manipulation, Spinal mobilization, Cryotherapy, Moist heat, Manual therapy, and Re-evaluation   PLAN FOR NEXT SESSION: Progress to AROM and parascapular strengthening exercises      Ellin Goodie PT, DPT  08/30/2022, 12:56 PM

## 2022-09-01 ENCOUNTER — Encounter: Payer: Medicare Other | Admitting: Physical Therapy

## 2022-09-06 ENCOUNTER — Encounter: Payer: Medicare Other | Admitting: Physical Therapy

## 2022-09-08 ENCOUNTER — Encounter: Payer: Medicare Other | Admitting: Physical Therapy

## 2022-09-13 ENCOUNTER — Other Ambulatory Visit: Payer: Self-pay

## 2022-09-13 ENCOUNTER — Ambulatory Visit: Payer: Medicare Other | Attending: Orthopedic Surgery

## 2022-09-13 ENCOUNTER — Encounter: Payer: Self-pay | Admitting: Physical Therapy

## 2022-09-13 DIAGNOSIS — M25512 Pain in left shoulder: Secondary | ICD-10-CM | POA: Diagnosis present

## 2022-09-13 DIAGNOSIS — Z96612 Presence of left artificial shoulder joint: Secondary | ICD-10-CM | POA: Diagnosis present

## 2022-09-13 DIAGNOSIS — Z87891 Personal history of nicotine dependence: Secondary | ICD-10-CM

## 2022-09-13 NOTE — Therapy (Signed)
OUTPATIENT PHYSICAL THERAPY TREATMENT NOTE   Patient Name: Andrea [name] MRN: [id number] DOB:Apr 18, 1951, 72 y.o., female Today's Date: 09/13/2022  PCP: Dr. Letitia Libra REFERRING PROVIDER: Dr. Cassell Smiles   END OF SESSION:   PT End of Session - 09/13/22 1108     Visit Number 5    Number of Visits 20    Date for PT Re-Evaluation 10/21/22    Authorization Type Medicare    Authorization Time Period 08/12/22-10/21/22    Authorization - Visit Number 5    Authorization - Number of Visits 20    Progress Note Due on Visit 10    PT Start Time 1110    PT Stop Time 1152    PT Time Calculation (min) 42 min    Activity Tolerance Patient tolerated treatment well    Behavior During Therapy 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   Cervical post-laminectomy syndrome    Chronic constipation    Colon polyp 2010   Complication of anesthesia    itching real bad or msucles spasms   COPD (chronic obstructive pulmonary disease) (HCC) 02/21/2019   Cubital tunnel syndrome    Depression    Disorders of sacrum    Disturbance of skin sensation    Emphysema lung (HCC) 2020   GERD (gastroesophageal reflux disease)    Heart murmur    History of abnormal cervical Pap smear    History of cataract    History of colon polyps    History of pneumonia 2008   Hot flashes    Hx of diplopia    right eye   Hyperlipidemia    Hypertension    Insomnia    Late effects of acute poliomyelitis    Lateral epicondylitis  of elbow    Loss of memory    Menopausal state    Neck pain    Osteoporosis    Pain in joint of left shoulder    Parkinson's disease 2011   Peripheral vascular disease (HCC) 2011   venous stasis    Personal history  of radiation therapy    PONV (postoperative nausea and vomiting)    Post-polio syndrome    Radiation 2008   BREAST CA   Sleep apnea    Tremor    Unspecified musculoskeletal disorders and symptoms referable to neck    cervical/trapezius   Vitamin D deficiency    Past Surgical History:  Procedure Laterality Date   ABDOMINAL HYSTERECTOMY  1997   ANTERIOR CERVICAL DECOMP/DISCECTOMY FUSION N/A 02/01/2019   Procedure: Anterior Cervical Decompression Fusion Cervical three-four, Cervical four-five, hardware removal Cervical five-six;  Surgeon: Julio Sicks, MD;  Location: [facility] OR;  Service: Neurosurgery;  Laterality: N/A;   BREAST LUMPECTOMY Right 2008   4 cm area of DCIS, margins less than 1 mm. Treated with wide excision, whole breast radiation   BREAST SURGERY Right 12/04/2009   Right simple mastectomy, sentinel node biopsy.   CARPAL TUNNEL RELEASE     COLONOSCOPY  2010,2015   Dr. Servando Snare, Dr. Mechele Collin   COLONOSCOPY     COLONOSCOPY N/A 06/18/2022   Procedure: COLONOSCOPY;  Surgeon: Elfredia Nevins  Casimiro Needle, DO;  Location: [facility] ENDOSCOPY;  Service: Gastroenterology;  Laterality: N/A;   COLONOSCOPY WITH PROPOFOL N/A 05/23/2017   Procedure: COLONOSCOPY WITH PROPOFOL;  Surgeon: Scot Jun, MD;  Location: [facility] ENDOSCOPY;  Service: Endoscopy;  Laterality: N/A;   ELBOW SURGERY Left 2011   ELBOW SURGERY Left 2015   ESOPHAGOGASTRODUODENOSCOPY     ESOPHAGOGASTRODUODENOSCOPY N/A 08/20/2021   Procedure: ESOPHAGOGASTRODUODENOSCOPY (EGD);  Surgeon: Jaynie Collins, DO;  Location: [facility] ENDOSCOPY;  Service: Gastroenterology;  Laterality: N/A;   FLEXIBLE SIGMOIDOSCOPY  07/19/2000   HIP SURGERY Right 06/19/2012   LEG SURGERY Right 1958   Corrcetive surgery for polio   LEG SURGERY Left 1958   corrective surgery for polio   MASTECTOMY Right 2011   BREAST CA   rectocele/enterocelle repair and perinoplasty     SHOULDER ARTHROSCOPY W/ ROTATOR CUFF REPAIR Bilateral 2010,2012   SHOULDER ARTHROSCOPY  WITH OPEN ROTATOR CUFF REPAIR Left 08/19/2015   Procedure: SHOULDER ARTHROSCOPY WITH  MINI OPEN ROTATOR CUFF REPAIR,SUBACROMIAL DECOMPRESSION, ARTHROSCOPIC BICEPS TENODESIS;  Surgeon: Juanell Fairly, MD;  Location: [facility] ORS;  Service: Orthopedics;  Laterality: Left;   SHOULDER SURGERY Left 2014   SPINE SURGERY     TOE FUSION Left 1970   little toe fusion   TONSILLECTOMY     TUBAL LIGATION     two left foot surgeries:polio related     Patient Active Problem List   Diagnosis Date Noted   Primary hypertension 07/28/2021   Insomnia 07/28/2021   History of right mastectomy 06/24/2021   Pneumonia 03/03/2021   Respiratory failure with hypoxia (HCC) 03/03/2021   COPD with acute exacerbation (HCC) 03/03/2021   Wheelchair dependence 10/09/2019   Cervical radiculitis 04/04/2019   Cervical spondylosis with myelopathy and radiculopathy 02/01/2019   B12 deficiency 07/29/2018   Osteoarthritis of hip 09/29/2017   Ataxia 04/15/2017   Loss of memory 04/15/2017   Cardiac murmur 03/04/2017   Hx of adenomatous colonic polyps 12/10/2016   Tremor 08/11/2016   Personal history of tobacco use, presenting hazards to health 06/13/2016   Falls frequently 08/05/2014   Personal history of malignant neoplasm of breast 07/26/2014   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   Lumbosacral spondylosis without myelopathy 04/30/2013   Subacute lumbar radiculopathy 04/30/2013   Chronic low back pain 02/19/2013   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 She initially had a left total shoulder arthoplasty on June 22nd.  She has had a history   PRECAUTIONS: No shoulder AROM, No Shoulder AAROM, No Shoulder PROM into IR , No reaching behind back especially into IR, No lifting objects, No supporting body weight with hands, place pillow under arm to avoid hyperextension of left arm   SUBJECTIVE:  SUBJECTIVE STATEMENT: Patient reports pain while doing exercises and at rest. Reports pain going down arm.  Rates 3/10 on arrival.    PAIN:  Are you having pain? Yes: NPRS scale: 4/10 Pain location: Left Bicep Muscle Belly   Pain description: Achy  Aggravating factors: Moving shoulder  Relieving factors: Keeping shoulder still    OBJECTIVE: (objective measures completed at initial evaluation unless otherwise dated)   VITALS: BP 103/43 SpO2 95% HR 74    DIAGNOSTIC FINDINGS:  [facility] so not listed in notes    PATIENT SURVEYS:  FOTO 19/100 with target 46    COGNITION: Overall cognitive status: Within functional limits for tasks assessed                                  SENSATION: WFL   POSTURE: Forward head and rounded shoulders    UPPER EXTREMITY ROM:    Active ROM Right eval Left eval  Shoulder flexion 160 100  Shoulder extension      Shoulder abduction 100 NT   Shoulder adduction      Shoulder internal rotation      Shoulder external rotation      Elbow flexion      Elbow extension      Wrist flexion      Wrist extension      Wrist ulnar deviation      Wrist radial deviation      Wrist pronation      Wrist supination      (Blank rows = not tested)   UPPER EXTREMITY MMT:   MMT Right eval Left eval  Shoulder flexion NT NT  Shoulder extension NT NT   Shoulder abduction NT NT   Shoulder  adduction      Shoulder internal rotation NT NT   Shoulder external rotation NT NT  Middle trapezius NT NT  Lower trapezius NT NT  Elbow flexion      Elbow extension      Wrist flexion      Wrist extension      Wrist ulnar deviation      Wrist radial deviation      Wrist pronation      Wrist supination      Grip strength (lbs)      (Blank rows = not tested)     JOINT MOBILITY TESTING:  NT    PALPATION:  Anterior left shoulder              TODAY'S TREATMENT:                                                                                                                                         DATE:   09/13/22: All exercises performed on L UE  AAROM shoulder flexion x 10, pain above 90 degrees AROM shoulder  flexion x 10, pain above 90 degrees AAROM shoulder abduction x 10, PROM to 85 degrees with slight pain  AROM shoulder abduction x 10, to 70 degrees  AROM ER x 10  Supine Serratus punches with 1# weight x 10, rates as hard  Supine chest press with dowel (no weight added) x 10 rates as easy, 1# AW on dowel x 15 Supine shoulder flexion with dowel (no weight) x 10  Seated row YTB 2 x 10  Standing ball circles clockwise and counterclockwise x 30 seconds each direction, hard  Supine D1 PNF x 5 Supine D2 PNF x 5   08/30/22: All exercises performed on LUE Shoulder Scaption to 120 degrees AAROM 3 x 15 Shoulder Flexion/Extension to 120 degrees AAROM 3 x 15  Supine Shoulder Flexion/Extension to 120 degrees AAROM 3 x 15    08/26/22: All exercises performed on LUE  Shoulder Scaption to 120 degrees AAROM 3 x 15 Shoulder Flexion/Extension to 120 degrees AAROM 3 x 15  Shoulder Flexion Isometric 5 sec hold 1 x 10  Shoulder ER Isometric 5 sec hold 1 x 10  Shoulder IR Isometric 5 sec hold 1 x 10   Palpation of left muscle belly of biceps: TTP especially closer to insertion where there is still bruising from surgery  -Advised perform elbow extension stretch 3 x 30 sec with left arm  resting on below  PATIENT EDUCATION: Education details: form and technique for correct exercise and precautions from surgical protocol provided to pt  Person educated: Patient Education method: Programmer, multimedia, Demonstration, Verbal cues, and Handouts Education comprehension: verbalized understanding, returned demonstration, and verbal cues required   HOME EXERCISE PROGRAM: Access Code: [email or web address] URL: [email or web address] Date: 08/30/2022 Prepared by: Ellin Goodie  Exercises - Seated Upper Trapezius Stretch  - 1 x daily - 3 reps - 30-60 sec hold - Seated Shoulder Flexion AAROM with Pulley Behind  - 7 x weekly - 3 sets - 15 reps - Seated Shoulder Scaption AAROM with Pulley at Side  - 1 x daily - 7 x weekly - 3 sets - 10 reps - Shoulder Abduction Towel Slide on Railing   - 1 x daily - 3 sets - 10 reps - Shoulder External Rotation and Internal Rotation PROM and use other hand to move left arm   - 1 x daily - 3 sets - 10 reps - Isometric Shoulder Flexion at Wall  - 3-4 x weekly - 3 sets - 10 reps - 5 sec hold hold - Isometric Shoulder External Rotation at Wall  - 3-4 x weekly - 3 sets - 10 reps - 5 sec hold hold - Isometric Shoulder Abduction at Wall  - 3-4 x weekly - 3 sets - 10 reps - 5 sec  hold - Standing Isometric Shoulder Internal Rotation at Doorway  - 3-4 x weekly - 3 sets - 10 reps - 5 sec  hold - Scare the Bear   - 3 x weekly - 3 sets - 10 reps - Supine Shoulder External Internal Rotation AAROM with Dowel  - 1 x daily - 3 sets - 15 reps   ASSESSMENT:   CLINICAL IMPRESSION: Patient is now s/p 6 weeks from L reverse TSA. Able to tolerate AAROM progressing to shoulder AROM. Initiate light scapular strengthening with yellow theraband and required for scapular retraction. Patient reports reduction in pain from 3/10 to 2/10 by end of session. Patient will continue to benefit from skilled therapy to address remaining deficits in order to improve quality of life  and return to  PLOF.     OBJECTIVE IMPAIRMENTS: decreased ROM, decreased strength, impaired flexibility, impaired UE functional use, and pain.    ACTIVITY LIMITATIONS: carrying, lifting, bathing, toileting, dressing, reach over head, and hygiene/grooming   PARTICIPATION LIMITATIONS: meal prep, cleaning, laundry, shopping, and yard work   PERSONAL FACTORS: Age, Past/current experiences, Time since onset of injury/illness/exacerbation, and 3+ comorbidities: COPD, Depression, Post Polio Syndrome  are also affecting patient's functional outcome.    REHAB POTENTIAL: Fair chronicity of issue and high number of co-morbidities    CLINICAL DECISION MAKING: Stable/uncomplicated   EVALUATION COMPLEXITY: Low     GOALS: Goals reviewed with patient? No   SHORT TERM GOALS: Target date: 08/26/2022     Pt will be independent with HEP in order to improve strength and balance in order to decrease fall risk and improve function at home and work. Baseline: Unable to perform independently   Goal status: Ongoing    2.  Patient will be able to stop wearing sling within 3 weeks as sign of shoulder healing.  Baseline: Wearing sling  Goal status: Ongoing      LONG TERM GOALS: Target date: 10/21/2022   Patient will have improved function and activity level as evidenced by an increase in FOTO score by 10 points or more.  Baseline: 19/100 target 46 Goal status: Ongoing    2.  Patient will improve left shoulder ROM to be near or greater than ROM of right shoulder to be able to perform overhead tasks for ADLs. Baseline: Shoulder Flex R/L 100/160, Shoulder Abduction R/L 100/ NT, Shoulder ER NT, Shoulder IR NT, Shoulder Ext R/L  Goal status: Ongoing    3.  Patient will improve left shoulder strength to be near or greater than right shoulder to perform overhead tasks for improve UE function.  Baseline: NT Goal status: Ongoing      PLAN:   PT FREQUENCY: 1-2x/week   PT DURATION: 10 weeks   PLANNED INTERVENTIONS:  Therapeutic exercises, Neuromuscular re-education, Joint mobilization, Joint manipulation, DME instructions, Dry Needling, Electrical stimulation, Spinal manipulation, Spinal mobilization, Cryotherapy, Moist heat, Manual therapy, and Re-evaluation   PLAN FOR NEXT SESSION:  Progress to AROM and parascapular strengthening exercises      Maylon Peppers, PT, DPT Physical Therapist - [facility]  09/13/2022, 12:08 PM

## 2022-09-15 ENCOUNTER — Encounter: Payer: Medicare Other | Admitting: Physical Therapy

## 2022-09-20 ENCOUNTER — Ambulatory Visit: Payer: Medicare Other | Admitting: Physical Therapy

## 2022-09-20 DIAGNOSIS — Z96612 Presence of left artificial shoulder joint: Secondary | ICD-10-CM

## 2022-09-20 DIAGNOSIS — M25512 Pain in left shoulder: Secondary | ICD-10-CM | POA: Diagnosis not present

## 2022-09-20 NOTE — Therapy (Signed)
OUTPATIENT PHYSICAL THERAPY TREATMENT NOTE   Patient Name: Andrea [name] MRN: [id number] DOB:1951-02-03, 72 y.o., female Today's Date: 09/20/2022  PCP: Dr. Letitia Libra REFERRING PROVIDER: Dr. Cassell Smiles   END OF SESSION:   PT End of Session - 09/20/22 1038     Visit Number 6    Number of Visits 20    Date for PT Re-Evaluation 10/21/22    Authorization Type Medicare    Authorization Time Period 08/12/22-10/21/22    Authorization - Visit Number 6    Authorization - Number of Visits 20    Progress Note Due on Visit 10    PT Start Time 1035    PT Stop Time 1115    PT Time Calculation (min) 40 min    Activity Tolerance Patient tolerated treatment well    Behavior During Therapy [facility] for tasks assessed/performed               Past Medical History:  Diagnosis Date   Allergic rhinitis due to allergen    Allergy    Breast cancer (HCC) 2008   RT LUMPECTOMY   Breast cancer (HCC) 2011   RT MASTECTOMY   Cancer (HCC) 4098,1191     High-grade DCIS,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   Cervical post-laminectomy syndrome    Chronic constipation    Colon polyp 2010   Complication of anesthesia    itching real bad or msucles spasms   COPD (chronic obstructive pulmonary disease) (HCC) 02/21/2019   Cubital tunnel syndrome    Depression    Disorders of sacrum    Disturbance of skin sensation    Emphysema lung (HCC) 2020   GERD (gastroesophageal reflux disease)    Heart murmur    History of abnormal cervical Pap smear    History of cataract    History of colon polyps    History of pneumonia 2008   Hot flashes    Hx of diplopia    right eye   Hyperlipidemia    Hypertension    Insomnia    Late effects of acute poliomyelitis    Lateral epicondylitis  of elbow    Loss of memory    Menopausal state    Neck pain    Osteoporosis    Pain in joint of left shoulder    Parkinson's disease 2011   Peripheral vascular disease (HCC) 2011   venous stasis    Personal history  of radiation therapy    PONV (postoperative nausea and vomiting)    Post-polio syndrome    Radiation 2008   BREAST CA   Sleep apnea    Tremor    Unspecified musculoskeletal disorders and symptoms referable to neck    cervical/trapezius   Vitamin D deficiency    Past Surgical History:  Procedure Laterality Date   ABDOMINAL HYSTERECTOMY  1997   ANTERIOR CERVICAL DECOMP/DISCECTOMY FUSION N/A 02/01/2019   Procedure: Anterior Cervical Decompression Fusion Cervical three-four, Cervical four-five, hardware removal Cervical five-six;  Surgeon: Julio Sicks, MD;  Location: [facility] OR;  Service: Neurosurgery;  Laterality: N/A;   BREAST LUMPECTOMY Right 2008   4 cm area of DCIS, margins less than 1 mm. Treated with wide excision, whole breast radiation   BREAST SURGERY Right 12/04/2009   Right simple mastectomy, sentinel node biopsy.   CARPAL TUNNEL RELEASE     COLONOSCOPY  2010,2015   Dr. Servando Snare, Dr. Mechele Collin   COLONOSCOPY     COLONOSCOPY N/A 06/18/2022   Procedure: COLONOSCOPY;  Surgeon: Elfredia Nevins  Casimiro Needle, DO;  Location: [facility] ENDOSCOPY;  Service: Gastroenterology;  Laterality: N/A;   COLONOSCOPY WITH PROPOFOL N/A 05/23/2017   Procedure: COLONOSCOPY WITH PROPOFOL;  Surgeon: Scot Jun, MD;  Location: [facility] ENDOSCOPY;  Service: Endoscopy;  Laterality: N/A;   ELBOW SURGERY Left 2011   ELBOW SURGERY Left 2015   ESOPHAGOGASTRODUODENOSCOPY     ESOPHAGOGASTRODUODENOSCOPY N/A 08/20/2021   Procedure: ESOPHAGOGASTRODUODENOSCOPY (EGD);  Surgeon: Jaynie Collins, DO;  Location: [facility] ENDOSCOPY;  Service: Gastroenterology;  Laterality: N/A;   FLEXIBLE SIGMOIDOSCOPY  07/19/2000   HIP SURGERY Right 06/19/2012   LEG SURGERY Right 1958   Corrcetive surgery for polio   LEG SURGERY Left 1958   corrective surgery for polio   MASTECTOMY Right 2011   BREAST CA   rectocele/enterocelle repair and perinoplasty     SHOULDER ARTHROSCOPY W/ ROTATOR CUFF REPAIR Bilateral 2010,2012   SHOULDER ARTHROSCOPY  WITH OPEN ROTATOR CUFF REPAIR Left 08/19/2015   Procedure: SHOULDER ARTHROSCOPY WITH  MINI OPEN ROTATOR CUFF REPAIR,SUBACROMIAL DECOMPRESSION, ARTHROSCOPIC BICEPS TENODESIS;  Surgeon: Juanell Fairly, MD;  Location: [facility] ORS;  Service: Orthopedics;  Laterality: Left;   SHOULDER SURGERY Left 2014   SPINE SURGERY     TOE FUSION Left 1970   little toe fusion   TONSILLECTOMY     TUBAL LIGATION     two left foot surgeries:polio related     Patient Active Problem List   Diagnosis Date Noted   Primary hypertension 07/28/2021   Insomnia 07/28/2021   History of right mastectomy 06/24/2021   Pneumonia 03/03/2021   Respiratory failure with hypoxia (HCC) 03/03/2021   COPD with acute exacerbation (HCC) 03/03/2021   Wheelchair dependence 10/09/2019   Cervical radiculitis 04/04/2019   Cervical spondylosis with myelopathy and radiculopathy 02/01/2019   B12 deficiency 07/29/2018   Osteoarthritis of hip 09/29/2017   Ataxia 04/15/2017   Loss of memory 04/15/2017   Cardiac murmur 03/04/2017   Hx of adenomatous colonic polyps 12/10/2016   Tremor 08/11/2016   Personal history of tobacco use, presenting hazards to health 06/13/2016   Falls frequently 08/05/2014   Personal history of malignant neoplasm of breast 07/26/2014   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   Lumbosacral spondylosis without myelopathy 04/30/2013   Subacute lumbar radiculopathy 04/30/2013   Chronic low back pain 02/19/2013   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 She initially had a left total shoulder arthoplasty on June 22nd.  She has had a history   PRECAUTIONS: No shoulder AROM, No Shoulder AAROM, No Shoulder PROM into IR , No reaching behind back especially into IR, No lifting objects, No supporting body weight with hands, place pillow under arm to avoid hyperextension of left arm   SUBJECTIVE:  SUBJECTIVE STATEMENT: Pt reports that she still has pain in her left elbow. She has discontinued using the sling. She is continuing to struggle to move LUE above 90 degrees   PAIN:  Are you having pain? Yes: NPRS scale: 3/10 Pain location: Left Bicep Muscle Belly   Pain description: Achy  Aggravating factors: Moving shoulder  Relieving factors: Keeping shoulder still    OBJECTIVE: (objective measures completed at initial evaluation unless otherwise dated)   VITALS: BP 103/43 SpO2 95% HR 74    DIAGNOSTIC FINDINGS:  [facility] so not listed in notes    PATIENT SURVEYS:  FOTO 19/100 with target 46    COGNITION: Overall cognitive status: Within functional limits for tasks assessed                                  SENSATION: WFL   POSTURE: Forward head and rounded shoulders    UPPER EXTREMITY ROM:    Active ROM Right eval Left eval  Shoulder flexion 160 100  Shoulder extension      Shoulder abduction 100 NT   Shoulder adduction      Shoulder internal rotation      Shoulder external rotation      Elbow flexion      Elbow extension      Wrist flexion      Wrist extension      Wrist ulnar deviation      Wrist radial deviation      Wrist pronation      Wrist supination      (Blank rows = not tested)   UPPER EXTREMITY MMT:   MMT Right eval Left eval  Shoulder flexion NT NT  Shoulder extension NT NT    Shoulder abduction NT NT   Shoulder adduction      Shoulder internal rotation NT NT   Shoulder external rotation NT NT  Middle trapezius NT NT  Lower trapezius NT NT  Elbow flexion      Elbow extension      Wrist flexion      Wrist extension      Wrist ulnar deviation      Wrist radial deviation      Wrist pronation      Wrist supination      Grip strength (lbs)      (Blank rows = not tested)     JOINT MOBILITY TESTING:  NT    PALPATION:  Anterior left shoulder              TODAY'S TREATMENT:                                                                                                                                         DATE:   09/20/22: All exercises performed on LUE UBE with seat at 5  and resistance at 0 for 5 min   PROM  Shoulder Flex R/L 160/130  Shoulder Abduction R/L 100  Shoulder ER L 70   Shoulder IR L 90   Supine shoulder flex AAROM with wand and #3 3 x 10  Shoulder Flex AAROM wash cloth slides on railing with incline 1 x 10   Shoulder Abd AAROM on railing with incline 1 x 10   09/13/22: All exercises performed on L UE  AAROM shoulder flexion x 10, pain above 90 degrees AROM shoulder flexion x 10, pain above 90 degrees AAROM shoulder abduction x 10, PROM to 85 degrees with slight pain  AROM shoulder abduction x 10, to 70 degrees  AROM ER x 10  Supine Serratus punches with 1# weight x 10, rates as hard  Supine chest press with dowel (no weight added) x 10 rates as easy, 1# AW on dowel x 15 Supine shoulder flexion with dowel (no weight) x 10  Seated row YTB 2 x 10  Standing ball circles clockwise and counterclockwise x 30 seconds each direction, hard  Supine D1 PNF x 5 Supine D2 PNF x 5   08/30/22: All exercises performed on LUE Shoulder Scaption to 120 degrees AAROM 3 x 15 Shoulder Flexion/Extension to 120 degrees AAROM 3 x 15  Supine Shoulder Flexion/Extension to 120 degrees AAROM 3 x 15     PATIENT EDUCATION: Education details: form and  technique for correct exercise and precautions from surgical protocol provided to pt  Person educated: Patient Education method: Programmer, multimedia, Demonstration, Verbal cues, and Handouts Education comprehension: verbalized understanding, returned demonstration, and verbal cues required   HOME EXERCISE PROGRAM: Access Code: [email or web address] URL: [email or web address] Date: 09/20/2022 Prepared by: Ellin Goodie  Exercises - Seated Upper Trapezius Stretch  - 1 x daily - 3 reps - 30-60 sec hold - Seated Shoulder Flexion AAROM with Pulley Behind  - 7 x weekly - 3 sets - 15 reps - Seated Shoulder Abduction AAROM with Pulley Behind  - 1 x daily - 3 sets - 10 reps - Scare the Bear   - 3 x weekly - 3 sets - 10 reps - Isometric Shoulder Flexion at Wall  - 3-4 x weekly - 3 sets - 10 reps - 5 sec hold hold - Isometric Shoulder External Rotation at Wall  - 3-4 x weekly - 3 sets - 10 reps - 5 sec hold hold - Isometric Shoulder Abduction at Wall  - 3-4 x weekly - 3 sets - 10 reps - 5 sec  hold - Standing Isometric Shoulder Internal Rotation at Doorway  - 3-4 x weekly - 3 sets - 10 reps - 5 sec  hold - Supine Shoulder External Internal Rotation AAROM with Dowel  - 1 x daily - 3 sets - 15 reps Shoulder railing flexion slides  7 per week x 3 x 10 reps  Shoulder railing abduction slides 7 per week x 3 x 10 resp    ASSESSMENT:   CLINICAL IMPRESSION: Patient is now s/p 7 weeks from L reverse TSA. She continues to have decreased left shoulder ROM especially in coronal plane. Initiated AAROM against increased gravity with railing slides. Next session to focus on AROM and parascapular strengthening. Patient will continue to benefit from skilled therapy to address remaining deficits in order to improve quality of life and return to PLOF.    OBJECTIVE IMPAIRMENTS: decreased ROM, decreased strength, impaired flexibility, impaired UE functional use, and pain.    ACTIVITY LIMITATIONS: carrying, lifting,  bathing,  toileting, dressing, reach over head, and hygiene/grooming   PARTICIPATION LIMITATIONS: meal prep, cleaning, laundry, shopping, and yard work   PERSONAL FACTORS: Age, Past/current experiences, Time since onset of injury/illness/exacerbation, and 3+ comorbidities: COPD, Depression, Post Polio Syndrome  are also affecting patient's functional outcome.    REHAB POTENTIAL: Fair chronicity of issue and high number of co-morbidities    CLINICAL DECISION MAKING: Stable/uncomplicated   EVALUATION COMPLEXITY: Low     GOALS: Goals reviewed with patient? No   SHORT TERM GOALS: Target date: 08/26/2022     Pt will be independent with HEP in order to improve strength and balance in order to decrease fall risk and improve function at home and work. Baseline: Unable to perform independently  09/20/22: Able to perform independently  Goal status: Achieved    2.  Patient will be able to stop wearing sling within 3 weeks as sign of shoulder healing.  Baseline: Wearing sling 09/20/22: Not wearing sling  Goal status: Achieved      LONG TERM GOALS: Target date: 10/21/2022   Patient will have improved function and activity level as evidenced by an increase in FOTO score by 10 points or more.  Baseline: 19/100 target 46 Goal status: Ongoing    2.  Patient will improve left shoulder ROM to be near or greater than ROM of right shoulder to be able to perform overhead tasks for ADLs. Baseline: Shoulder Flex R/L 100/160, Shoulder Abduction R/L 100/ NT, Shoulder ER NT, Shoulder IR NT, Shoulder Ext R/L  Goal status: Ongoing    3.  Patient will improve left shoulder strength to be near or greater than right shoulder to perform overhead tasks for improve UE function.  Baseline: NT Goal status: Ongoing      PLAN:   PT FREQUENCY: 1-2x/week   PT DURATION: 10 weeks   PLANNED INTERVENTIONS: Therapeutic exercises, Neuromuscular re-education, Joint mobilization, Joint manipulation, DME instructions, Dry  Needling, Electrical stimulation, Spinal manipulation, Spinal mobilization, Cryotherapy, Moist heat, Manual therapy, and Re-evaluation   PLAN FOR NEXT SESSION:  FOTO. Progress to AROM and parascapular strengthening exercises: serratus punches and ball roll on wall.    Ellin Goodie PT, DPT  09/20/2022, 11:03 AM

## 2022-09-22 ENCOUNTER — Encounter: Payer: Medicare Other | Admitting: Physical Therapy

## 2022-09-22 ENCOUNTER — Ambulatory Visit: Payer: Medicare Other | Admitting: Physical Therapy

## 2022-09-22 DIAGNOSIS — M25512 Pain in left shoulder: Secondary | ICD-10-CM

## 2022-09-22 DIAGNOSIS — Z96612 Presence of left artificial shoulder joint: Secondary | ICD-10-CM

## 2022-09-22 NOTE — Therapy (Signed)
OUTPATIENT PHYSICAL THERAPY TREATMENT NOTE   Patient Name: Andrea [name] MRN: [id number] DOB:[birth year], 72 y.o., female Today's Date: 09/22/2022  PCP: Dr. Letitia Libra REFERRING PROVIDER: Dr. Cassell Smiles   END OF SESSION:   PT End of Session - 09/22/22 1129     Visit Number 7    Number of Visits 20    Date for PT Re-Evaluation 10/21/22    Authorization Type Medicare    Authorization Time Period 08/12/22-10/21/22    Authorization - Visit Number 7    Authorization - Number of Visits 20    Progress Note Due on Visit 10    PT Start Time 1120    PT Stop Time 1200    PT Time Calculation (min) 40 min    Activity Tolerance Patient tolerated treatment well    Behavior During Therapy [facility] for tasks assessed/performed               Past Medical History:  Diagnosis Date   Allergic rhinitis due to allergen    Allergy    Breast cancer (HCC) 2008   RT LUMPECTOMY   Breast cancer (HCC) 2011   RT MASTECTOMY   Cancer (HCC) 6578,4696     High-grade DCIS,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   Cervical post-laminectomy syndrome    Chronic constipation    Colon polyp 2010   Complication of anesthesia    itching real bad or msucles spasms   COPD (chronic obstructive pulmonary disease) (HCC) 02/21/2019   Cubital tunnel syndrome    Depression    Disorders of sacrum    Disturbance of skin sensation    Emphysema lung (HCC) 2020   GERD (gastroesophageal reflux disease)    Heart murmur    History of abnormal cervical Pap smear    History of cataract    History of colon polyps    History of pneumonia 2008   Hot flashes    Hx of diplopia    right eye   Hyperlipidemia    Hypertension    Insomnia    Late effects of acute poliomyelitis    Lateral epicondylitis  of elbow    Loss of memory    Menopausal state    Neck pain    Osteoporosis    Pain in joint of left shoulder    Parkinson's disease 2011   Peripheral vascular disease (HCC) 2011   venous stasis    Personal history  of radiation therapy    PONV (postoperative nausea and vomiting)    Post-polio syndrome    Radiation 2008   BREAST CA   Sleep apnea    Tremor    Unspecified musculoskeletal disorders and symptoms referable to neck    cervical/trapezius   Vitamin D deficiency    Past Surgical History:  Procedure Laterality Date   ABDOMINAL HYSTERECTOMY  1997   ANTERIOR CERVICAL DECOMP/DISCECTOMY FUSION N/A 02/01/2019   Procedure: Anterior Cervical Decompression Fusion Cervical three-four, Cervical four-five, hardware removal Cervical five-six;  Surgeon: Julio Sicks, MD;  Location: [facility] OR;  Service: Neurosurgery;  Laterality: N/A;   BREAST LUMPECTOMY Right 2008   4 cm area of DCIS, margins less than 1 mm. Treated with wide excision, whole breast radiation   BREAST SURGERY Right 12/04/2009   Right simple mastectomy, sentinel node biopsy.   CARPAL TUNNEL RELEASE     COLONOSCOPY  2010,2015   Dr. Servando Snare, Dr. Mechele Collin   COLONOSCOPY     COLONOSCOPY N/A 06/18/2022   Procedure: COLONOSCOPY;  Surgeon: Elfredia Nevins  Casimiro Needle, DO;  Location: [facility] ENDOSCOPY;  Service: Gastroenterology;  Laterality: N/A;   COLONOSCOPY WITH PROPOFOL N/A 05/23/2017   Procedure: COLONOSCOPY WITH PROPOFOL;  Surgeon: Scot Jun, MD;  Location: [facility] ENDOSCOPY;  Service: Endoscopy;  Laterality: N/A;   ELBOW SURGERY Left 2011   ELBOW SURGERY Left 2015   ESOPHAGOGASTRODUODENOSCOPY     ESOPHAGOGASTRODUODENOSCOPY N/A 08/20/2021   Procedure: ESOPHAGOGASTRODUODENOSCOPY (EGD);  Surgeon: Jaynie Collins, DO;  Location: [facility] ENDOSCOPY;  Service: Gastroenterology;  Laterality: N/A;   FLEXIBLE SIGMOIDOSCOPY  07/19/2000   HIP SURGERY Right 06/19/2012   LEG SURGERY Right 1958   Corrcetive surgery for polio   LEG SURGERY Left 1958   corrective surgery for polio   MASTECTOMY Right 2011   BREAST CA   rectocele/enterocelle repair and perinoplasty     SHOULDER ARTHROSCOPY W/ ROTATOR CUFF REPAIR Bilateral 2010,2012   SHOULDER ARTHROSCOPY  WITH OPEN ROTATOR CUFF REPAIR Left 08/19/2015   Procedure: SHOULDER ARTHROSCOPY WITH  MINI OPEN ROTATOR CUFF REPAIR,SUBACROMIAL DECOMPRESSION, ARTHROSCOPIC BICEPS TENODESIS;  Surgeon: Juanell Fairly, MD;  Location: [facility] ORS;  Service: Orthopedics;  Laterality: Left;   SHOULDER SURGERY Left 2014   SPINE SURGERY     TOE FUSION Left 1970   little toe fusion   TONSILLECTOMY     TUBAL LIGATION     two left foot surgeries:polio related     Patient Active Problem List   Diagnosis Date Noted   Primary hypertension 07/28/2021   Insomnia 07/28/2021   History of right mastectomy 06/24/2021   Pneumonia 03/03/2021   Respiratory failure with hypoxia (HCC) 03/03/2021   COPD with acute exacerbation (HCC) 03/03/2021   Wheelchair dependence 10/09/2019   Cervical radiculitis 04/04/2019   Cervical spondylosis with myelopathy and radiculopathy 02/01/2019   B12 deficiency 07/29/2018   Osteoarthritis of hip 09/29/2017   Ataxia 04/15/2017   Loss of memory 04/15/2017   Cardiac murmur 03/04/2017   Hx of adenomatous colonic polyps 12/10/2016   Tremor 08/11/2016   Personal history of tobacco use, presenting hazards to health 06/13/2016   Falls frequently 08/05/2014   Personal history of malignant neoplasm of breast 07/26/2014   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   Lumbosacral spondylosis without myelopathy 04/30/2013   Subacute lumbar radiculopathy 04/30/2013   Chronic low back pain 02/19/2013   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 She initially had a left total shoulder arthoplasty on June 22nd.  She has had a history   PRECAUTIONS: No shoulder AROM, No Shoulder AAROM, No Shoulder PROM into IR , No reaching behind back especially into IR, No lifting objects, No supporting body weight with hands, place pillow under arm to avoid hyperextension of left arm   SUBJECTIVE:  SUBJECTIVE STATEMENT: Pt states that she is having trouble performing the railing slides. She continues to feel some pain in tenderness along anterior left shoulder. She believes area is a hematoma.   PAIN:  Are you having pain? Yes: NPRS scale: 3/10 Pain location: Left Bicep Muscle Belly   Pain description: Achy  Aggravating factors: Moving shoulder  Relieving factors: Keeping shoulder still    OBJECTIVE: (objective measures completed at initial evaluation unless otherwise dated)   VITALS: BP 103/43 SpO2 95% HR 74    DIAGNOSTIC FINDINGS:  [facility] so not listed in notes    PATIENT SURVEYS:  FOTO 19/100 with target 46    COGNITION: Overall cognitive status: Within functional limits for tasks assessed                                  SENSATION: WFL   POSTURE: Forward head and rounded shoulders    UPPER EXTREMITY ROM:    Active ROM Right eval Left eval  Shoulder flexion 160 60  Shoulder extension      Shoulder abduction 100 60  Shoulder adduction      Shoulder internal rotation      Shoulder external rotation      Elbow flexion      Elbow extension      Wrist flexion      Wrist extension      Wrist ulnar deviation      Wrist radial deviation      Wrist pronation      Wrist supination      (Blank rows = not tested)   UPPER EXTREMITY MMT:   MMT Right eval Left eval  Shoulder flexion NT NT  Shoulder  extension NT NT   Shoulder abduction NT NT   Shoulder adduction      Shoulder internal rotation NT NT   Shoulder external rotation NT NT  Middle trapezius NT NT  Lower trapezius NT NT  Elbow flexion      Elbow extension      Wrist flexion      Wrist extension      Wrist ulnar deviation      Wrist radial deviation      Wrist pronation      Wrist supination      Grip strength (lbs)      (Blank rows = not tested)     JOINT MOBILITY TESTING:  NT    PALPATION:  Anterior left shoulder              TODAY'S TREATMENT:                                                                                                                                         DATE:   09/22/22: All exercises performed on LUE  UBE with seat at  7 and resistance at 0 for 5 min  Shoulder Flex R/L 160/70 Shoulder Abd R/L 110/60  Shoulder Ext R/L 60/40 Shoulder ER R/L /70 Shoulder IR R/L  /90                                                                 Cervical                                AROM                                            Flex    45                                           Ext      45                        Lat Side Bend R/L 45/45                       Rotation       R/L     60/60     Active ROM Right eval Left eval  Shoulder flexion 180 180  Shoulder extension 60 60  Shoulder abduction 180  180  Shoulder adduction    Shoulder internal rotation 70 70  Shoulder external rotation 90 90  Elbow flexion 150 150  Elbow extension 60 60  Wrist flexion 80 80  Wrist extension 70 70  Wrist ulnar deviation 30 30  Wrist radial deviation 20 20  Wrist pronation    Wrist supination    (Blank rows = not tested)          09/22/22: All exercises performed on LUE UBE with seat at 5 and resistance at 0 for 5 min   AROM  Shoulder Flex R/L 160/60 Shoulder Abduction R/L 160/60  Shoulder ER R/L 60/70   Shoulder IR R/L 70/70  Palpation of bony prominence of left shoulder: Not  hematoma; likely hardware and not present on right shoulder  FOTO: 40 Supine Shoulder RUE #1 DB Flex 3 X 10 -Pt able to reach 160 degrees   Supine Shoulder AAROM IR/ER 3 x 10   Seated Shoulder AAROM IR/ER 3 x 10   09/20/22:  Supine shoulder flex AAROM with wand and #3 3 x 10  Shoulder Flex AAROM wash cloth slides on railing with incline 1 x 10   Shoulder Abd AAROM on railing with incline 1 x 10   09/13/22: All exercises performed on L UE  AAROM shoulder flexion x 10, pain above 90 degrees AROM shoulder flexion x 10, pain above 90 degrees AAROM shoulder abduction x 10, PROM to 85 degrees with slight pain  AROM shoulder abduction x 10, to 70 degrees  AROM ER x 10  Supine Serratus punches with 1# weight x 10, rates as hard  Supine chest press with dowel (no weight added)  x 10 rates as easy, 1# AW on dowel x 15 Supine shoulder flexion with dowel (no weight) x 10  Seated row YTB 2 x 10  Standing ball circles clockwise and counterclockwise x 30 seconds each direction, hard  Supine D1 PNF x 5 Supine D2 PNF x 5   PATIENT EDUCATION: Education details: form and technique for correct exercise and precautions from surgical protocol provided to pt  Person educated: Patient Education method: Programmer, multimedia, Demonstration, Verbal cues, and Handouts Education comprehension: verbalized understanding, returned demonstration, and verbal cues required   HOME EXERCISE PROGRAM: Access Code: [email or web address] URL: [email or web address] Date: 09/22/2022 Prepared by: Ellin Goodie  Exercises - Seated Upper Trapezius Stretch  - 1 x daily - 3 reps - 30-60 sec hold - Seated Shoulder Flexion AAROM with Pulley Behind  - 7 x weekly - 3 sets - 15 reps - Seated Shoulder Abduction AAROM with Pulley Behind  - 1 x daily - 3 sets - 10 reps - Isometric Shoulder External Rotation at Wall  - 3-4 x weekly - 3 sets - 10 reps - 5 sec hold hold - Isometric Shoulder Abduction at Wall  - 3-4 x weekly - 3 sets - 10  reps - 5 sec  hold - Standing Isometric Shoulder Internal Rotation at Doorway  - 3-4 x weekly - 3 sets - 10 reps - 5 sec  hold - Supine Shoulder External Internal Rotation AAROM with Dowel  - 1 x daily - 3 sets - 15 reps - Supine Shoulder Flexion Extension Full Range AROM  - 3-4 x weekly - 3 sets - 10 reps - Seated Shoulder ER and IR AAROM wiht cane   - 1 x daily - 3 sets - 10 reps   ASSESSMENT:   CLINICAL IMPRESSION: Patient is now s/p 7 weeks from L reverse TSA. She continues to show decreased left shoulder AROM, but she has improved her perception of LUE function as evidenced by improvement in FOTO score. She will continue to benefit from skilled to regain left shoulder AROM and strength to improve ability to perform over head tasks for ADLs.   OBJECTIVE IMPAIRMENTS: decreased ROM, decreased strength, impaired flexibility, impaired UE functional use, and pain.    ACTIVITY LIMITATIONS: carrying, lifting, bathing, toileting, dressing, reach over head, and hygiene/grooming   PARTICIPATION LIMITATIONS: meal prep, cleaning, laundry, shopping, and yard work   PERSONAL FACTORS: Age, Past/current experiences, Time since onset of injury/illness/exacerbation, and 3+ comorbidities: COPD, Depression, Post Polio Syndrome  are also affecting patient's functional outcome.    REHAB POTENTIAL: Fair chronicity of issue and high number of co-morbidities    CLINICAL DECISION MAKING: Stable/uncomplicated   EVALUATION COMPLEXITY: Low     GOALS: Goals reviewed with patient? No   SHORT TERM GOALS: Target date: 08/26/2022     Pt will be independent with HEP in order to improve strength and balance in order to decrease fall risk and improve function at home and work. Baseline: Unable to perform independently  09/20/22: Able to perform independently  Goal status: Achieved    2.  Patient will be able to stop wearing sling within 3 weeks as sign of shoulder healing.  Baseline: Wearing sling 09/20/22: Not  wearing sling  Goal status: Achieved      LONG TERM GOALS: Target date: 10/21/2022   Patient will have improved function and activity level as evidenced by an increase in FOTO score by 10 points or more.  Baseline: 19/100 target 46 09/22/22: 40  Goal status: Ongoing  2.  Patient will improve left shoulder ROM to be near or greater than ROM of right shoulder to be able to perform overhead tasks for ADLs. Baseline: All PROM Shoulder Flex R/L 160/100, Shoulder Abduction R/L 100/ NT, Shoulder ER NT, Shoulder IR NT, Shoulder Ext R/L 09/22/22: All AROM Shoulder Flex R/L 160/60, Shoulder Abd R/L 160/60, Shoulder ER R/L 70/60, Shoulder IR R/L 70/70  Goal status: Partially Met    3.  Patient will improve left shoulder strength to be near or greater than right shoulder to perform overhead tasks for improve UE function.  Baseline: NT 09/22/22: Not able to move through full ROM against gravity (3-) Goal status: Partially Met      PLAN:   PT FREQUENCY: 1-2x/week   PT DURATION: 10 weeks   PLANNED INTERVENTIONS: Therapeutic exercises, Neuromuscular re-education, Joint mobilization, Joint manipulation, DME instructions, Dry Needling, Electrical stimulation, Spinal manipulation, Spinal mobilization, Cryotherapy, Moist heat, Manual therapy, and Re-evaluation   PLAN FOR NEXT SESSION:  Progress to AROM and parascapular strengthening exercises: wall walks on wall for flexion and abduction, Supine abduction arom or with resistance, shoulder rhythmic stabilization in supine.    Ellin Goodie PT, DPT  09/22/2022, 12:25 PM

## 2022-09-24 ENCOUNTER — Ambulatory Visit
Admission: RE | Admit: 2022-09-24 | Discharge: 2022-09-24 | Disposition: A | Discharge status: Home / Self Care | Payer: Medicare Other | Source: Ambulatory Visit | Attending: Acute Care | Admitting: Acute Care

## 2022-09-24 DIAGNOSIS — Z87891 Personal history of nicotine dependence: Secondary | ICD-10-CM | POA: Diagnosis present

## 2022-09-28 ENCOUNTER — Ambulatory Visit
Admission: RE | Admit: 2022-09-28 | Discharge: 2022-09-28 | Disposition: A | Discharge status: Home / Self Care | Payer: Medicare Other | Source: Ambulatory Visit | Attending: General Surgery | Admitting: General Surgery

## 2022-09-28 DIAGNOSIS — Z1231 Encounter for screening mammogram for malignant neoplasm of breast: Secondary | ICD-10-CM | POA: Insufficient documentation

## 2022-09-29 NOTE — Therapy (Signed)
OUTPATIENT PHYSICAL THERAPY TREATMENT NOTE   Patient Name: Andrea [name] MRN: [id number] DOB:05-Jun-1950, 72 y.o., female Today's Date: 09/30/2022  PCP: Dr. Letitia Libra REFERRING PROVIDER: Dr. Cassell Smiles   END OF SESSION:   PT End of Session - 09/30/22 1442     Visit Number 8    Number of Visits 20    Date for PT Re-Evaluation 10/21/22    Authorization Type Medicare    Authorization Time Period 08/12/22-10/21/22    Authorization - Visit Number 8    Authorization - Number of Visits 20    Progress Note Due on Visit 10    PT Start Time 1440    PT Stop Time 1519    PT Time Calculation (min) 39 min    Activity Tolerance Patient tolerated treatment well    Behavior During Therapy [facility] for tasks assessed/performed                Past Medical History:  Diagnosis Date   Allergic rhinitis due to allergen    Allergy    Breast cancer (HCC) 2008   RT LUMPECTOMY   Breast cancer (HCC) 2011   RT MASTECTOMY   Cancer (HCC) 7829,5621     High-grade DCIS,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   Cervical post-laminectomy syndrome    Chronic constipation    Colon polyp 2010   Complication of anesthesia    itching real bad or msucles spasms   COPD (chronic obstructive pulmonary disease) (HCC) 02/21/2019   Cubital tunnel syndrome    Depression    Disorders of sacrum    Disturbance of skin sensation    Emphysema lung (HCC) 2020   GERD (gastroesophageal reflux disease)    Heart murmur    History of abnormal cervical Pap smear    History of cataract    History of colon polyps    History of pneumonia 2008   Hot flashes    Hx of diplopia    right eye   Hyperlipidemia    Hypertension    Insomnia    Late effects of acute poliomyelitis    Lateral epicondylitis  of elbow    Loss of memory    Menopausal state    Neck pain    Osteoporosis    Pain in joint of left shoulder    Parkinson's disease 2011   Peripheral vascular disease (HCC) 2011   venous stasis    Personal history  of radiation therapy    PONV (postoperative nausea and vomiting)    Post-polio syndrome    Radiation 2008   BREAST CA   Sleep apnea    Tremor    Unspecified musculoskeletal disorders and symptoms referable to neck    cervical/trapezius   Vitamin D deficiency    Past Surgical History:  Procedure Laterality Date   ABDOMINAL HYSTERECTOMY  1997   ANTERIOR CERVICAL DECOMP/DISCECTOMY FUSION N/A 02/01/2019   Procedure: Anterior Cervical Decompression Fusion Cervical three-four, Cervical four-five, hardware removal Cervical five-six;  Surgeon: Julio Sicks, MD;  Location: [facility] OR;  Service: Neurosurgery;  Laterality: N/A;   BREAST LUMPECTOMY Right 2008   4 cm area of DCIS, margins less than 1 mm. Treated with wide excision, whole breast radiation   BREAST SURGERY Right 12/04/2009   Right simple mastectomy, sentinel node biopsy.   CARPAL TUNNEL RELEASE     COLONOSCOPY  2010,2015   Dr. Servando Snare, Dr. Mechele Collin   COLONOSCOPY     COLONOSCOPY N/A 06/18/2022   Procedure: COLONOSCOPY;  Surgeon: Timothy Lasso,  Thomas Hoff, DO;  Location: [facility] ENDOSCOPY;  Service: Gastroenterology;  Laterality: N/A;   COLONOSCOPY WITH PROPOFOL N/A 05/23/2017   Procedure: COLONOSCOPY WITH PROPOFOL;  Surgeon: Scot Jun, MD;  Location: [facility] ENDOSCOPY;  Service: Endoscopy;  Laterality: N/A;   ELBOW SURGERY Left 2011   ELBOW SURGERY Left 2015   ESOPHAGOGASTRODUODENOSCOPY     ESOPHAGOGASTRODUODENOSCOPY N/A 08/20/2021   Procedure: ESOPHAGOGASTRODUODENOSCOPY (EGD);  Surgeon: Jaynie Collins, DO;  Location: [facility] ENDOSCOPY;  Service: Gastroenterology;  Laterality: N/A;   FLEXIBLE SIGMOIDOSCOPY  07/19/2000   HIP SURGERY Right 06/19/2012   LEG SURGERY Right 1958   Corrcetive surgery for polio   LEG SURGERY Left 1958   corrective surgery for polio   MASTECTOMY Right 2011   BREAST CA   rectocele/enterocelle repair and perinoplasty     SHOULDER ARTHROSCOPY W/ ROTATOR CUFF REPAIR Bilateral 2010,2012   SHOULDER ARTHROSCOPY  WITH OPEN ROTATOR CUFF REPAIR Left 08/19/2015   Procedure: SHOULDER ARTHROSCOPY WITH  MINI OPEN ROTATOR CUFF REPAIR,SUBACROMIAL DECOMPRESSION, ARTHROSCOPIC BICEPS TENODESIS;  Surgeon: Juanell Fairly, MD;  Location: [facility] ORS;  Service: Orthopedics;  Laterality: Left;   SHOULDER SURGERY Left 2014   SPINE SURGERY     TOE FUSION Left 1970   little toe fusion   TONSILLECTOMY     TUBAL LIGATION     two left foot surgeries:polio related     Patient Active Problem List   Diagnosis Date Noted   Primary hypertension 07/28/2021   Insomnia 07/28/2021   History of right mastectomy 06/24/2021   Pneumonia 03/03/2021   Respiratory failure with hypoxia (HCC) 03/03/2021   COPD with acute exacerbation (HCC) 03/03/2021   Wheelchair dependence 10/09/2019   Cervical radiculitis 04/04/2019   Cervical spondylosis with myelopathy and radiculopathy 02/01/2019   B12 deficiency 07/29/2018   Osteoarthritis of hip 09/29/2017   Ataxia 04/15/2017   Loss of memory 04/15/2017   Cardiac murmur 03/04/2017   Hx of adenomatous colonic polyps 12/10/2016   Tremor 08/11/2016   Personal history of tobacco use, presenting hazards to health 06/13/2016   Falls frequently 08/05/2014   Personal history of malignant neoplasm of breast 07/26/2014   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   Lumbosacral spondylosis without myelopathy 04/30/2013   Subacute lumbar radiculopathy 04/30/2013   Chronic low back pain 02/19/2013   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 She initially had a left total shoulder arthoplasty on June 22nd.  She has had a history   PRECAUTIONS: No shoulder AROM, No Shoulder AAROM, No Shoulder PROM into IR , No reaching behind back especially into IR, No lifting objects, No supporting body weight with hands, place pillow under arm to avoid hyperextension of left arm   SUBJECTIVE:  SUBJECTIVE STATEMENT: Patient states increased pain over the weekend and unable to perform exercises. Reports 5/10 pain on arrival.   PAIN:  Are you having pain? Yes: NPRS scale: 3/10 Pain location: Left Bicep Muscle Belly   Pain description: Achy  Aggravating factors: Moving shoulder  Relieving factors: Keeping shoulder still    OBJECTIVE: (objective measures completed at initial evaluation unless otherwise dated)   VITALS: BP 103/43 SpO2 95% HR 74    DIAGNOSTIC FINDINGS:  [facility] so not listed in notes    PATIENT SURVEYS:  FOTO 19/100 with target 46    COGNITION: Overall cognitive status: Within functional limits for tasks assessed                                  SENSATION: WFL   POSTURE: Forward head and rounded shoulders    UPPER EXTREMITY ROM:    Active ROM Right eval Left eval  Shoulder flexion 160 60  Shoulder extension      Shoulder abduction 100 60  Shoulder adduction      Shoulder internal rotation      Shoulder external rotation      Elbow flexion      Elbow extension      Wrist flexion      Wrist extension      Wrist ulnar deviation      Wrist radial deviation      Wrist pronation      Wrist supination      (Blank rows = not tested)   UPPER EXTREMITY MMT:   MMT Right eval Left eval  Shoulder flexion NT NT  Shoulder extension NT NT   Shoulder abduction NT NT   Shoulder adduction       Shoulder internal rotation NT NT   Shoulder external rotation NT NT  Middle trapezius NT NT  Lower trapezius NT NT  Elbow flexion      Elbow extension      Wrist flexion      Wrist extension      Wrist ulnar deviation      Wrist radial deviation      Wrist pronation      Wrist supination      Grip strength (lbs)      (Blank rows = not tested)     JOINT MOBILITY TESTING:  NT    PALPATION:  Anterior left shoulder              TODAY'S TREATMENT:                                                                                                                                         DATE:  09/30/22:  UBE with seat at 7 and resistance 0 x 5 minutes (2.5 fwd/2.5 bwd)  AAROM shoulder flexion at pulleys 3 x  10 AAROM shoulder abduction at pulleys 3 x 10, to 90 degrees Supine shoulder flexion 1# DB 3 x 10 on  L to 90 degrees Supine shoulder abduction 1#DB 3 x 10 on L to 80 degrees  Supine shoulder stabilization with 1# DB 3 x 10, clockwise and counterclockwise    09/22/22: All exercises performed on LUE  UBE with seat at 7 and resistance at 0 for 5 min  Shoulder Flex R/L 160/70 Shoulder Abd R/L 110/60  Shoulder Ext R/L 60/40 Shoulder ER R/L /70 Shoulder IR R/L  /90                                                                 Cervical                                AROM                                            Flex    45                                           Ext      45                        Lat Side Bend R/L 45/45                       Rotation       R/L     60/60     Active ROM Right eval Left eval  Shoulder flexion 180 180  Shoulder extension 60 60  Shoulder abduction 180  180  Shoulder adduction    Shoulder internal rotation 70 70  Shoulder external rotation 90 90  Elbow flexion 150 150  Elbow extension 60 60  Wrist flexion 80 80  Wrist extension 70 70  Wrist ulnar deviation 30 30  Wrist radial deviation 20 20  Wrist pronation    Wrist supination     (Blank rows = not tested)          09/22/22: All exercises performed on LUE UBE with seat at 5 and resistance at 0 for 5 min   AROM  Shoulder Flex R/L 160/60 Shoulder Abduction R/L 160/60  Shoulder ER R/L 60/70   Shoulder IR R/L 70/70  Palpation of bony prominence of left shoulder: Not hematoma; likely hardware and not present on right shoulder  FOTO: 40 Supine Shoulder RUE #1 DB Flex 3 X 10 -Pt able to reach 160 degrees   Supine Shoulder AAROM IR/ER 3 x 10   Seated Shoulder AAROM IR/ER 3 x 10   09/20/22:  Supine shoulder flex AAROM with wand and #3 3 x 10  Shoulder Flex AAROM wash cloth slides on railing with incline 1 x 10   Shoulder Abd AAROM on railing with incline 1 x 10   09/13/22: All exercises performed on L UE  AAROM shoulder flexion x 10, pain above 90 degrees AROM shoulder flexion x 10, pain above 90 degrees AAROM shoulder abduction x 10, PROM to 85 degrees with slight pain  AROM shoulder abduction x 10, to 70 degrees  AROM ER x 10  Supine Serratus punches with 1# weight x 10, rates as hard  Supine chest press with dowel (no weight added) x 10 rates as easy, 1# AW on dowel x 15 Supine shoulder flexion with dowel (no weight) x 10  Seated row YTB 2 x 10  Standing ball circles clockwise and counterclockwise x 30 seconds each direction, hard  Supine D1 PNF x 5 Supine D2 PNF x 5   PATIENT EDUCATION: Education details: form and technique for correct exercise and precautions from surgical protocol provided to pt  Person educated: Patient Education method: Programmer, multimedia, Demonstration, Verbal cues, and Handouts Education comprehension: verbalized understanding, returned demonstration, and verbal cues required   HOME EXERCISE PROGRAM: Access Code: [email or web address] URL: [email or web address] Date: 09/22/2022 Prepared by: Ellin Goodie  Exercises - Seated Upper Trapezius Stretch  - 1 x daily - 3 reps - 30-60 sec hold - Seated Shoulder Flexion AAROM with  Pulley Behind  - 7 x weekly - 3 sets - 15 reps - Seated Shoulder Abduction AAROM with Pulley Behind  - 1 x daily - 3 sets - 10 reps - Isometric Shoulder External Rotation at Wall  - 3-4 x weekly - 3 sets - 10 reps - 5 sec hold hold - Isometric Shoulder Abduction at Wall  - 3-4 x weekly - 3 sets - 10 reps - 5 sec  hold - Standing Isometric Shoulder Internal Rotation at Doorway  - 3-4 x weekly - 3 sets - 10 reps - 5 sec  hold - Supine Shoulder External Internal Rotation AAROM with Dowel  - 1 x daily - 3 sets - 15 reps - Supine Shoulder Flexion Extension Full Range AROM  - 3-4 x weekly - 3 sets - 10 reps - Seated Shoulder ER and IR AAROM wiht cane   - 1 x daily - 3 sets - 10 reps   ASSESSMENT:   CLINICAL IMPRESSION:   Patient is now s/p 8 weeks from L reverse TSA. Continues to have difficulty with AROM of L shoulder with and without resistance. Session toned down to accommodate patient's increased pain in L shoulder. Tolerated treatment session well with improvement of pain by end of session. Patient will continue to benefit from skilled therapy to address remaining deficits in order to improve quality of life and return to PLOF.     OBJECTIVE IMPAIRMENTS: decreased ROM, decreased strength, impaired flexibility, impaired UE functional use, and pain.    ACTIVITY LIMITATIONS: carrying, lifting, bathing, toileting, dressing, reach over head, and hygiene/grooming   PARTICIPATION LIMITATIONS: meal prep, cleaning, laundry, shopping, and yard work   PERSONAL FACTORS: Age, Past/current experiences, Time since onset of injury/illness/exacerbation, and 3+ comorbidities: COPD, Depression, Post Polio Syndrome  are also affecting patient's functional outcome.    REHAB POTENTIAL: Fair chronicity of issue and high number of co-morbidities    CLINICAL DECISION MAKING: Stable/uncomplicated   EVALUATION COMPLEXITY: Low     GOALS: Goals reviewed with patient? No   SHORT TERM GOALS: Target date: 08/26/2022      Pt will be independent with HEP in order to improve strength and balance in order to decrease fall risk and improve function at home and work. Baseline: Unable to perform independently  09/20/22: Able to perform independently  Goal status:  Achieved    2.  Patient will be able to stop wearing sling within 3 weeks as sign of shoulder healing.  Baseline: Wearing sling 09/20/22: Not wearing sling  Goal status: Achieved      LONG TERM GOALS: Target date: 10/21/2022   Patient will have improved function and activity level as evidenced by an increase in FOTO score by 10 points or more.  Baseline: 19/100 target 46 09/22/22: 40  Goal status: Ongoing    2.  Patient will improve left shoulder ROM to be near or greater than ROM of right shoulder to be able to perform overhead tasks for ADLs. Baseline: All PROM Shoulder Flex R/L 160/100, Shoulder Abduction R/L 100/ NT, Shoulder ER NT, Shoulder IR NT, Shoulder Ext R/L 09/22/22: All AROM Shoulder Flex R/L 160/60, Shoulder Abd R/L 160/60, Shoulder ER R/L 70/60, Shoulder IR R/L 70/70  Goal status: Partially Met    3.  Patient will improve left shoulder strength to be near or greater than right shoulder to perform overhead tasks for improve UE function.  Baseline: NT 09/22/22: Not able to move through full ROM against gravity (3-) Goal status: Partially Met      PLAN:   PT FREQUENCY: 1-2x/week   PT DURATION: 10 weeks   PLANNED INTERVENTIONS: Therapeutic exercises, Neuromuscular re-education, Joint mobilization, Joint manipulation, DME instructions, Dry Needling, Electrical stimulation, Spinal manipulation, Spinal mobilization, Cryotherapy, Moist heat, Manual therapy, and Re-evaluation   PLAN FOR NEXT SESSION: Progress to AROM and parascapular strengthening exercises: wall walks on wall for flexion and abduction, Supine abduction arom or with resistance, shoulder rhythmic stabilization in supine.    Maylon Peppers, PT, DPT Physical Therapist - [facility]  09/30/2022, 3:23 PM

## 2022-09-30 ENCOUNTER — Encounter: Payer: Medicare Other | Admitting: Physical Therapy

## 2022-09-30 ENCOUNTER — Ambulatory Visit: Payer: Medicare Other

## 2022-09-30 ENCOUNTER — Encounter: Payer: Self-pay | Admitting: Physical Therapy

## 2022-09-30 DIAGNOSIS — Z96612 Presence of left artificial shoulder joint: Secondary | ICD-10-CM

## 2022-09-30 DIAGNOSIS — M25512 Pain in left shoulder: Secondary | ICD-10-CM

## 2022-10-04 ENCOUNTER — Ambulatory Visit: Payer: Medicare Other | Admitting: Physical Therapy

## 2022-10-04 ENCOUNTER — Other Ambulatory Visit: Payer: Self-pay

## 2022-10-04 DIAGNOSIS — Z122 Encounter for screening for malignant neoplasm of respiratory organs: Secondary | ICD-10-CM

## 2022-10-04 DIAGNOSIS — Z87891 Personal history of nicotine dependence: Secondary | ICD-10-CM

## 2022-10-06 ENCOUNTER — Encounter: Payer: Self-pay | Admitting: Gastroenterology

## 2022-10-06 ENCOUNTER — Encounter: Payer: Medicare Other | Admitting: Physical Therapy

## 2022-10-06 NOTE — H&P (Signed)
Pre-Procedure H&P   Patient ID: Andrea [name] is a 72 y.o. female.  Gastroenterology Provider: Jaynie Collins, DO  Referring Provider: Fransico Setters, NP PCP: Gracelyn Nurse, MD  Date: 10/07/2022  HPI Andrea [name] is a 72 y.o. female who presents today for Colonoscopy for Surveillance-personal history of colon polyps .  Patient with a personal history of polyps.  Underwent last colonoscopy in January 2019 with 1 tubular adenoma.  Sigmoid diverticulosis and internal hemorrhoids were also noted.  She deals with chronic constipation. Takes colace and miralax on occasion. No melena or hematochezia.  No family history of colon cancer or colon polyps.  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   Cervical post-laminectomy syndrome    Chronic constipation    Colon polyp 2010   Complication of anesthesia    itching real bad or msucles spasms   COPD (chronic obstructive pulmonary disease) (HCC) 02/21/2019   Cubital tunnel syndrome    Depression    Disorders of sacrum    Disturbance of skin sensation    Emphysema lung (HCC) 2020   GERD (gastroesophageal reflux disease)    Heart murmur    History of abnormal cervical Pap smear    History of cataract    History of colon polyps    History of pneumonia 2008   Hot flashes    Hx of diplopia    right eye   Hyperlipidemia    Hypertension    Insomnia    Late effects of acute poliomyelitis    Lateral epicondylitis  of elbow    Loss of memory    Menopausal state    Neck pain    Osteoporosis    Pain in joint of left shoulder    Parkinson's disease 2011   Peripheral vascular disease (HCC) 2011   venous stasis     Personal history of radiation therapy    PONV (postoperative nausea and vomiting)    Post-polio syndrome    Radiation 2008   BREAST CA   Sleep apnea    Tremor    Unspecified musculoskeletal disorders and symptoms referable to neck    cervical/trapezius   Vitamin D deficiency     Past Surgical History:  Procedure Laterality Date   ABDOMINAL HYSTERECTOMY  1997   ANTERIOR CERVICAL DECOMP/DISCECTOMY FUSION N/A 02/01/2019   Procedure: Anterior Cervical Decompression Fusion Cervical three-four, Cervical four-five, hardware removal Cervical five-six;  Surgeon: Julio Sicks, MD;  Location: [facility] OR;  Service: Neurosurgery;  Laterality: N/A;   BREAST LUMPECTOMY Right 2008   4 cm area of DCIS, margins less than 1 mm. Treated with wide excision, whole breast radiation   BREAST SURGERY Right 12/04/2009   Right simple mastectomy, sentinel node biopsy.   CARPAL TUNNEL RELEASE     COLONOSCOPY  2010,2015   Dr. Servando Snare, Dr. Mechele Collin   COLONOSCOPY     COLONOSCOPY N/A 06/18/2022   Procedure: COLONOSCOPY;  Surgeon: Jaynie Collins, DO;  Location: [facility] ENDOSCOPY;  Service: Gastroenterology;  Laterality: N/A;   COLONOSCOPY WITH PROPOFOL N/A 05/23/2017   Procedure: COLONOSCOPY WITH PROPOFOL;  Surgeon: Scot Jun, MD;  Location: [facility] ENDOSCOPY;  Service: Endoscopy;  Laterality: N/A;   ELBOW SURGERY Left 2011   ELBOW SURGERY Left 2015   ESOPHAGOGASTRODUODENOSCOPY     ESOPHAGOGASTRODUODENOSCOPY N/A 08/20/2021   Procedure: ESOPHAGOGASTRODUODENOSCOPY (EGD);  Surgeon: Jaynie Collins, DO;  Location: [facility] ENDOSCOPY;  Service: Gastroenterology;  Laterality: N/A;   FLEXIBLE SIGMOIDOSCOPY  07/19/2000   HIP SURGERY Right 06/19/2012   LEG SURGERY Right 1958   Corrcetive surgery for polio   LEG SURGERY Left 1958   corrective surgery for polio   MASTECTOMY Right 2011   BREAST CA   rectocele/enterocelle repair and perinoplasty     SHOULDER ARTHROSCOPY W/ ROTATOR CUFF REPAIR Bilateral 2010,2012    SHOULDER ARTHROSCOPY WITH OPEN ROTATOR CUFF REPAIR Left 08/19/2015   Procedure: SHOULDER ARTHROSCOPY WITH  MINI OPEN ROTATOR CUFF REPAIR,SUBACROMIAL DECOMPRESSION, ARTHROSCOPIC BICEPS TENODESIS;  Surgeon: Juanell Fairly, MD;  Location: [facility] ORS;  Service: Orthopedics;  Laterality: Left;   SHOULDER SURGERY Left 2014   SPINE SURGERY     TOE FUSION Left 1970   little toe fusion   TONSILLECTOMY     TUBAL LIGATION     two left foot surgeries:polio related      Family History No h/o GI disease or malignancy  Review of Systems  Constitutional:  Negative for activity change, appetite change, chills, diaphoresis, fatigue, fever and unexpected weight change.  HENT:  Negative for trouble swallowing and voice change.   Respiratory:  Negative for shortness of breath and wheezing.   Cardiovascular:  Negative for chest pain, palpitations and leg swelling.  Gastrointestinal:  Positive for constipation. Negative for abdominal distention, abdominal pain, anal bleeding, blood in stool, diarrhea, nausea, rectal pain and vomiting.  Musculoskeletal:  Negative for arthralgias and myalgias.  Skin:  Negative for color change and pallor.  Neurological:  Negative for dizziness, syncope and weakness.  Psychiatric/Behavioral:  Negative for confusion.   All other systems reviewed and are negative.    Medications No current facility-administered medications on file prior to encounter.   Current Outpatient Medications on File Prior to Encounter  Medication Sig Dispense Refill   amLODipine (NORVASC) 2.5 MG tablet Take 2.5 mg by mouth daily.     aspirin EC 81 MG tablet Take 81 mg by mouth daily. Swallow whole.     atorvastatin (LIPITOR) 40 MG tablet Take 40 mg by mouth daily.     DULoxetine (CYMBALTA) 60 MG capsule Take 60 mg by mouth daily.     HYDROcodone-acetaminophen (NORCO) 10-325 MG tablet Take 1 tablet by mouth every 6 (six) hours as needed for moderate pain.      losartan (COZAAR) 25 MG tablet Take 1  tablet by mouth daily.     meloxicam (MOBIC) 7.5 MG tablet Take 7.5 mg by mouth 2 (two) times daily.      omeprazole (PRILOSEC) 40 MG capsule Take 40 mg by mouth daily.     pregabalin (LYRICA) 150 MG capsule Take by mouth.     albuterol (VENTOLIN HFA) 108 (90 Base) MCG/ACT inhaler Inhale 2 puffs into the lungs every 6 (six) hours as needed for wheezing or shortness of breath. 8 g 2   ANORO ELLIPTA 62.5-25 MCG/ACT AEPB Inhale 1 puff into the lungs daily.     atorvastatin (LIPITOR) 80 MG tablet Take 80 mg by mouth daily.     calcium carbonate (TUMS EX) 750 MG chewable tablet Chew 1 tablet by mouth daily.     Cholecalciferol (D3 VITAMIN PO) Take by mouth daily.  diphenhydrAMINE (BENADRYL) 50 MG tablet Take 50 mg by mouth at bedtime as needed for itching.     estrogens, conjugated, (PREMARIN) 0.3 MG tablet Take 0.3 mg by mouth daily. Take daily for 21 days then do not take for 7 days.     Green Coffee Bean-Yerba Mate (GREEN COFFEE BEAN EXTRACT PO) Take 400 mg by mouth.     losartan (COZAAR) 25 MG tablet Take 25 mg by mouth daily.     Multiple Vitamins-Minerals (PA WOMENS 50 PLUS VITAPAK PO) Take by mouth daily.     omeprazole (PRILOSEC) 40 MG capsule Take by mouth.     oxyCODONE-acetaminophen (PERCOCET) 5-325 MG tablet Take 1 tablet by mouth every 4 (four) hours as needed for severe pain. 30 tablet 0   polyethylene glycol (MIRALAX / GLYCOLAX) packet Take 17 g by mouth daily as needed for moderate constipation.      potassium chloride SA (KLOR-CON M) 20 MEQ tablet potassium chloride ER 20 mEq tablet,extended release(part/cryst)  TAKE 1 TABLET BY MOUTH ONCE DAILY FOR 14 DAYS     pregabalin (LYRICA) 100 MG capsule Take 100 mg by mouth 3 (three) times daily.      tiZANidine (ZANAFLEX) 4 MG tablet Take 4 mg by mouth 4 (four) times daily as needed for muscle spasms.      tiZANidine (ZANAFLEX) 4 MG tablet Take by mouth.     traZODone (DESYREL) 50 MG tablet Take 50 mg by mouth at bedtime as needed for  sleep.  (Patient not taking: Reported on 10/07/2022)     triamcinolone (NASACORT) 55 MCG/ACT AERO nasal inhaler Place 2 sprays into the nose daily as needed (allergies).     zoledronic acid (RECLAST) 5 MG/100ML SOLN injection Inject 5 mg into the vein once.      Pertinent medications related to GI and procedure were reviewed by me with the patient prior to the procedure   Current Facility-Administered Medications:    0.9 %  sodium chloride infusion, , Intravenous, Continuous, Jaynie Collins, DO  sodium chloride         Allergies  Allergen Reactions   Lisinopril     Raises BP   Allergies were reviewed by me prior to the procedure  Objective   Body mass index is 22.46 kg/m. Vitals:   10/07/22 0744 10/07/22 0800  BP:  (!) 163/79  Pulse:  64  Resp:  16  Temp:  (!) 97 F (36.1 C)  TempSrc:  Temporal  SpO2:  100%  Weight: 52.2 kg   Height:  5' (1.524 m)     Physical Exam Vitals and nursing note reviewed.  Constitutional:      General: She is not in acute distress.    Appearance: Normal appearance. She is not ill-appearing, toxic-appearing or diaphoretic.     Comments: Thin appearing  HENT:     Head: Normocephalic and atraumatic.     Nose: Nose normal.     Mouth/Throat:     Mouth: Mucous membranes are moist.     Pharynx: Oropharynx is clear.  Eyes:     General: No scleral icterus.    Extraocular Movements: Extraocular movements intact.  Cardiovascular:     Rate and Rhythm: Normal rate and regular rhythm.     Heart sounds: Murmur heard.     No friction rub. No gallop.  Pulmonary:     Effort: Pulmonary effort is normal. No respiratory distress.     Breath sounds: Normal breath sounds. No wheezing, rhonchi or rales.  Abdominal:     General: Abdomen is flat. Bowel sounds are normal. There is no distension.     Palpations: Abdomen is soft.     Tenderness: There is no abdominal tenderness. There is no guarding or rebound.  Musculoskeletal:     Cervical back:  Neck supple.     Right lower leg: No edema.     Left lower leg: No edema.  Skin:    General: Skin is warm and dry.     Coloration: Skin is not jaundiced or pale.  Neurological:     General: No focal deficit present.     Mental Status: She is alert and oriented to person, place, and time. Mental status is at baseline.  Psychiatric:        Mood and Affect: Mood normal.        Behavior: Behavior normal.        Thought Content: Thought content normal.        Judgment: Judgment normal.      Assessment:  Andrea [name] is a 72 y.o. female  who presents today for Colonoscopy for personal history of colon polyps.  Plan:  Colonoscopy with possible intervention today  Colonoscopy with possible biopsy, control of bleeding, polypectomy, and interventions as necessary has been discussed with the patient/patient representative. Informed consent was obtained from the patient/patient representative after explaining the indication, nature, and risks of the procedure including but not limited to death, bleeding, perforation, missed neoplasm/lesions, cardiorespiratory compromise, and reaction to medications. Opportunity for questions was given and appropriate answers were provided. Patient/patient representative has verbalized understanding is amenable to undergoing the procedure.   Jaynie Collins, DO  [facility] Gastroenterology  Portions of the record may have been created with voice recognition software. Occasional wrong-word or 'sound-a-like' substitutions may have occurred due to the inherent limitations of voice recognition software.  Read the chart carefully and recognize, using context, where substitutions may have occurred.

## 2022-10-07 ENCOUNTER — Ambulatory Visit: Payer: Medicare Other | Admitting: Certified Registered"

## 2022-10-07 ENCOUNTER — Encounter
Admission: RE | Disposition: A | Discharge status: Home / Self Care | Payer: Self-pay | Source: Home / Self Care | Attending: Gastroenterology

## 2022-10-07 ENCOUNTER — Encounter: Payer: Self-pay | Admitting: Gastroenterology

## 2022-10-07 ENCOUNTER — Ambulatory Visit
Admission: RE | Admit: 2022-10-07 | Discharge: 2022-10-07 | Disposition: A | Discharge status: Home / Self Care | Payer: Medicare Other | Attending: Gastroenterology | Admitting: Gastroenterology

## 2022-10-07 DIAGNOSIS — I10 Essential (primary) hypertension: Secondary | ICD-10-CM | POA: Diagnosis not present

## 2022-10-07 DIAGNOSIS — Z86010 Personal history of colonic polyps: Secondary | ICD-10-CM | POA: Insufficient documentation

## 2022-10-07 DIAGNOSIS — K5909 Other constipation: Secondary | ICD-10-CM | POA: Insufficient documentation

## 2022-10-07 DIAGNOSIS — Z923 Personal history of irradiation: Secondary | ICD-10-CM | POA: Diagnosis not present

## 2022-10-07 DIAGNOSIS — Z1211 Encounter for screening for malignant neoplasm of colon: Secondary | ICD-10-CM | POA: Diagnosis present

## 2022-10-07 DIAGNOSIS — K5730 Diverticulosis of large intestine without perforation or abscess without bleeding: Secondary | ICD-10-CM | POA: Diagnosis not present

## 2022-10-07 DIAGNOSIS — K648 Other hemorrhoids: Secondary | ICD-10-CM | POA: Insufficient documentation

## 2022-10-07 DIAGNOSIS — Z09 Encounter for follow-up examination after completed treatment for conditions other than malignant neoplasm: Secondary | ICD-10-CM | POA: Insufficient documentation

## 2022-10-07 DIAGNOSIS — D123 Benign neoplasm of transverse colon: Secondary | ICD-10-CM | POA: Insufficient documentation

## 2022-10-07 DIAGNOSIS — Z87891 Personal history of nicotine dependence: Secondary | ICD-10-CM | POA: Diagnosis not present

## 2022-10-07 DIAGNOSIS — Z79899 Other long term (current) drug therapy: Secondary | ICD-10-CM | POA: Insufficient documentation

## 2022-10-07 DIAGNOSIS — Z9011 Acquired absence of right breast and nipple: Secondary | ICD-10-CM | POA: Diagnosis not present

## 2022-10-07 DIAGNOSIS — D128 Benign neoplasm of rectum: Secondary | ICD-10-CM | POA: Diagnosis not present

## 2022-10-07 DIAGNOSIS — K219 Gastro-esophageal reflux disease without esophagitis: Secondary | ICD-10-CM | POA: Insufficient documentation

## 2022-10-07 DIAGNOSIS — Z853 Personal history of malignant neoplasm of breast: Secondary | ICD-10-CM | POA: Insufficient documentation

## 2022-10-07 DIAGNOSIS — F32A Depression, unspecified: Secondary | ICD-10-CM | POA: Diagnosis not present

## 2022-10-07 DIAGNOSIS — Z08 Encounter for follow-up examination after completed treatment for malignant neoplasm: Secondary | ICD-10-CM | POA: Diagnosis not present

## 2022-10-07 HISTORY — PX: COLONOSCOPY: SHX5424

## 2022-10-07 SURGERY — COLONOSCOPY
Anesthesia: General

## 2022-10-07 MED ORDER — PROPOFOL 10 MG/ML IV BOLUS
INTRAVENOUS | Status: DC | PRN
  Administered 2022-10-07: 160 ug/kg/min via INTRAVENOUS
  Administered 2022-10-07: 80 mg via INTRAVENOUS

## 2022-10-07 MED ORDER — PROPOFOL 1000 MG/100ML IV EMUL
INTRAVENOUS | Status: AC
  Filled 2022-10-07: qty 100

## 2022-10-07 MED ORDER — SODIUM CHLORIDE 0.9 % IV SOLN: INTRAVENOUS | Status: DC

## 2022-10-07 MED ORDER — LIDOCAINE HCL (PF) 2 % IJ SOLN
INTRAMUSCULAR | Status: AC
  Filled 2022-10-07: qty 5

## 2022-10-07 MED ORDER — LIDOCAINE HCL (CARDIAC) PF 100 MG/5ML IV SOSY
PREFILLED_SYRINGE | INTRAVENOUS | Status: DC | PRN
  Administered 2022-10-07: 50 mg via INTRAVENOUS

## 2022-10-07 NOTE — Anesthesia Preprocedure Evaluation (Signed)
Anesthesia Evaluation  Patient identified by MRN, date of birth, ID band Patient awake    Reviewed: Allergy & Precautions, NPO status , Patient's Chart, lab work & pertinent test results  History of Anesthesia Complications (+) PONV and history of anesthetic complications  Airway Mallampati: II  TM Distance: >3 FB Neck ROM: Full    Dental  (+) Dental Advidsory Given, Poor Dentition   Pulmonary neg shortness of breath, neg sleep apnea, neg COPD, neg recent URI, former smoker   breath sounds clear to auscultation- rhonchi (-) wheezing      Cardiovascular hypertension, Pt. on medications (-) angina + Peripheral Vascular Disease  (-) CAD, (-) Past MI, (-) Cardiac Stents and (-) CABG + Valvular Problems/Murmurs  Rhythm:Regular Rate:Normal - Systolic murmurs and - Diastolic murmurs    Neuro/Psych neg Seizures PSYCHIATRIC DISORDERS  Depression    negative neurological ROS     GI/Hepatic Neg liver ROS,GERD  ,,  Endo/Other  negative endocrine ROSneg diabetes    Renal/GU negative Renal ROS     Musculoskeletal  (+) Arthritis ,    Abdominal  (+) - obese  Peds  Hematology negative hematology ROS (+)   Anesthesia Other Findings Past Medical History: 2008: Breast cancer (HCC)     Comment:  RT LUMPECTOMY 2011: Breast cancer (HCC)     Comment:  RT MASTECTOMY 2008,2011 : Cancer (HCC)     Comment:   High-grade DCIS,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 No date: Cervical post-laminectomy syndrome No date: Chronic constipation No date: Complication of anesthesia     Comment:  itching real bad or msucles spasms No date: Cubital tunnel syndrome No date: Depression No date: Disorders of sacrum No date: Disturbance of skin sensation No date: GERD (gastroesophageal reflux disease) No date: History of colon polyps No date: Hot flashes No date: Hx of diplopia     Comment:  right eye No date: Hyperlipidemia No  date: Hypertension No date: Insomnia No date: Late effects of acute poliomyelitis No date: Lateral epicondylitis  of elbow No date: Loss of memory No date: Menopausal state No date: Osteoporosis No date: Pain in joint of left shoulder 2011: Parkinson's disease (HCC) 2011: Peripheral vascular disease (HCC)     Comment:  venous stasis  No date: Personal history of radiation therapy 2008: Radiation     Comment:  BREAST CA No date: Tremor No date: Unspecified musculoskeletal disorders and symptoms referable  to neck     Comment:  cervical/trapezius No date: Vitamin D deficiency   Reproductive/Obstetrics                              Anesthesia Physical Anesthesia Plan  ASA: 3  Anesthesia Plan: General   Post-op Pain Management: Minimal or no pain anticipated   Induction: Intravenous  PONV Risk Score and Plan: 4 or greater and Propofol infusion  Airway Management Planned: Natural Airway and Nasal Cannula  Additional Equipment: None  Intra-op Plan:   Post-operative Plan:   Informed Consent: I have reviewed the patients History and Physical, chart, labs and discussed the procedure including the risks, benefits and alternatives for the proposed anesthesia with the patient or authorized representative who has indicated his/her understanding and acceptance.     Dental advisory given  Plan Discussed with: CRNA and Anesthesiologist  Anesthesia Plan Comments: (Discussed risks of anesthesia with patient, including possibility  of difficulty with spontaneous ventilation under anesthesia necessitating airway intervention, PONV, and rare risks such as cardiac or respiratory or neurological events, and allergic reactions. Discussed the role of CRNA in patient's perioperative care. Patient understands.)         Anesthesia Quick Evaluation

## 2022-10-07 NOTE — Op Note (Signed)
[facility] Gastroenterology Patient Name: Andrea [name] Procedure Date: 10/07/2022 8:11 AM MRN: [id number] Account #: [id number] Date of Birth: 08/04/1950 Admit Type: Outpatient Age: 72 Room: [facility] ENDO ROOM 1 Gender: Female Note Status: Finalized Instrument Name: Peds Colonoscope [id number] Procedure:             Colonoscopy Indications:           High risk colon cancer surveillance: Personal history                         of colonic polyps Providers:             Trenda Moots, DO Referring MD:          Craig Guess, MD (Referring MD) Medicines:             Monitored Anesthesia Care Complications:         No immediate complications. Estimated blood loss:                         Minimal. Procedure:             Pre-Anesthesia Assessment:                        - Prior to the procedure, a History and Physical was                         performed, and patient medications and allergies were                         reviewed. The patient is competent. The risks and                         benefits of the procedure and the sedation options and                         risks were discussed with the patient. All questions                         were answered and informed consent was obtained.                         Patient identification and proposed procedure were                         verified by the physician, the nurse, the anesthetist                         and the technician in the endoscopy suite. Mental                         Status Examination: alert and oriented. Airway                         Examination: normal oropharyngeal airway and neck                         mobility. Respiratory Examination: clear to  auscultation. CV Examination: systolic murmur.                         Prophylactic Antibiotics: The patient does not require                         prophylactic antibiotics. Prior Anticoagulants: The                          patient has taken no anticoagulant or antiplatelet                         agents. ASA Grade Assessment: III - A patient with                         severe systemic disease. After reviewing the risks and                         benefits, the patient was deemed in satisfactory                         condition to undergo the procedure. The anesthesia                         plan was to use monitored anesthesia care (MAC).                         Immediately prior to administration of medications,                         the patient was re-assessed for adequacy to receive                         sedatives. The heart rate, respiratory rate, oxygen                         saturations, blood pressure, adequacy of pulmonary                         ventilation, and response to care were monitored                         throughout the procedure. The physical status of the                         patient was re-assessed after the procedure.                        After obtaining informed consent, the colonoscope was                         passed under direct vision. Throughout the procedure,                         the patient's blood pressure, pulse, and oxygen                         saturations were monitored continuously. The  Colonoscope was introduced through the anus and                         advanced to the the terminal ileum, with                         identification of the appendiceal orifice and IC                         valve. The colonoscopy was performed without                         difficulty. The patient tolerated the procedure well.                         The quality of the bowel preparation was evaluated                         using the BBPS [facility] Bowel Preparation Scale) with                         scores of: Right Colon = 2 (minor amount of residual                         staining, small fragments of stool and/or opaque                          liquid, but mucosa seen well), Transverse Colon = 2                         (minor amount of residual staining, small fragments of                         stool and/or opaque liquid, but mucosa seen well) and                         Left Colon = 3 (entire mucosa seen well with no                         residual staining, small fragments of stool or opaque                         liquid). The total BBPS score equals 7. The quality of                         the bowel preparation was good. The terminal ileum,                         ileocecal valve, appendiceal orifice, and rectum were                         photographed. Findings:      The perianal and digital rectal examinations were normal. Pertinent       negatives include normal sphincter tone.      The terminal ileum appeared normal. Estimated blood loss: none.      Retroflexion in the right colon was performed.  Two sessile polyps were found in the rectum and transverse colon. The       polyps were 1 to 2 mm in size. These polyps were removed with a jumbo       cold forceps. Resection and retrieval were complete. Estimated blood       loss was minimal.      A few small-mouthed diverticula were found in the recto-sigmoid colon       and sigmoid colon. Estimated blood loss: none.      The exam was otherwise without abnormality on direct and retroflexion       views. Impression:            - The examined portion of the ileum was normal.                        - Two 1 to 2 mm polyps in the rectum and in the                         transverse colon, removed with a jumbo cold forceps.                         Resected and retrieved.                        - Diverticulosis in the recto-sigmoid colon and in the                         sigmoid colon.                        - The examination was otherwise normal on direct and                         retroflexion views. Recommendation:        - Patient has a contact number  available for                         emergencies. The signs and symptoms of potential                         delayed complications were discussed with the patient.                         Return to normal activities tomorrow. Written                         discharge instructions were provided to the patient.                        - Discharge patient to home.                        - Resume previous diet.                        - Continue present medications.                        - Await pathology results.                        -  Repeat colonoscopy for surveillance based on                         pathology results.                        - Return to referring physician as previously                         scheduled.                        - The findings and recommendations were discussed with                         the patient. Procedure Code(s):     --- Professional ---                        [phone], Colonoscopy, flexible; with biopsy, single or                         multiple Diagnosis Code(s):     --- Professional ---                        Z86.010, Personal history of colonic polyps                        D12.8, Benign neoplasm of rectum                        D12.3, Benign neoplasm of transverse colon (hepatic                         flexure or splenic flexure)                        K57.30, Diverticulosis of large intestine without                         perforation or abscess without bleeding CPT copyright 2022 American Medical Association. All rights reserved. The codes documented in this report are preliminary and upon coder review may  be revised to meet current compliance requirements. Attending Participation:      I personally performed the entire procedure. Elfredia Nevins, DO Jaynie Collins DO, DO 10/07/2022 8:52:13 AM This report has been signed electronically. Number of Addenda: 0 Note Initiated On: 10/07/2022 8:11 AM Scope Withdrawal Time: 0 hours 15  minutes 18 seconds  Total Procedure Duration: 0 hours 23 minutes 0 seconds  Estimated Blood Loss:  Estimated blood loss was minimal.      [facility]

## 2022-10-07 NOTE — Interval H&P Note (Signed)
History and Physical Interval Note: Preprocedure H&P from 10/07/22  was reviewed and there was no interval change after seeing and examining the patient.  Written consent was obtained from the patient after discussion of risks, benefits, and alternatives. Patient has consented to proceed with Colonoscopy with possible intervention   10/07/2022 8:13 AM  [name]  has presented today for surgery, with the diagnosis of V12.72 (ICD-9-CM) - Z86.010 (ICD-10-CM) - Hx of adenomatous colonic polyps.  The various methods of treatment have been discussed with the patient and family. After consideration of risks, benefits and other options for treatment, the patient has consented to  Procedure(s): COLONOSCOPY (N/A) as a surgical intervention.  The patient's history has been reviewed, patient examined, no change in status, stable for surgery.  I have reviewed the patient's chart and labs.  Questions were answered to the patient's satisfaction.     Andrea [name]

## 2022-10-07 NOTE — Anesthesia Postprocedure Evaluation (Signed)
Anesthesia Post Note  Patient: ARLETHA TREVITHICK  Procedure(s) Performed: COLONOSCOPY  Patient location during evaluation: Endoscopy Anesthesia Type: General Level of consciousness: awake and alert Pain management: pain level controlled Vital Signs Assessment: post-procedure vital signs reviewed and stable Respiratory status: spontaneous breathing, nonlabored ventilation, respiratory function stable and patient connected to nasal cannula oxygen Cardiovascular status: blood pressure returned to baseline and stable Postop Assessment: no apparent nausea or vomiting Anesthetic complications: no   No notable events documented.   Last Vitals:  Vitals:   10/07/22 0849 10/07/22 0909  BP: 107/80 (!) 164/92  Pulse: 78   Resp: 15   Temp: (!) 36.2 C   SpO2: 100%     Last Pain:  Vitals:   10/07/22 0909  TempSrc:   PainSc: 0-No pain                 Corinda Gubler

## 2022-10-07 NOTE — Transfer of Care (Signed)
Immediate Anesthesia Transfer of Care Note  Patient: Andrea [name]  Procedure(s) Performed: COLONOSCOPY  Patient Location: PACU  Anesthesia Type:General  Level of Consciousness: drowsy  Airway & Oxygen Therapy: Patient Spontanous Breathing  Post-op Assessment: Report given to RN and Post -op Vital signs reviewed and stable  Post vital signs: Reviewed and stable  Last Vitals:  Vitals Value Taken Time  BP 107/80 10/07/22 0850  Temp 35.8 0849  Pulse 79 10/07/22 0851  Resp 18 10/07/22 0851  SpO2 100 % 10/07/22 0851  Vitals shown include unvalidated device data.  Last Pain:  Vitals:   10/07/22 0800  TempSrc: Temporal         Complications: No notable events documented.

## 2022-10-08 ENCOUNTER — Encounter: Payer: Self-pay | Admitting: Gastroenterology

## 2022-10-11 ENCOUNTER — Encounter: Payer: Medicare Other | Admitting: Physical Therapy

## 2022-10-21 ENCOUNTER — Encounter: Payer: Medicare Other | Admitting: Physical Therapy

## 2022-10-25 ENCOUNTER — Encounter: Payer: Medicare Other | Admitting: Physical Therapy

## 2022-11-02 ENCOUNTER — Encounter: Payer: Medicare Other | Admitting: Physical Therapy

## 2022-11-09 ENCOUNTER — Encounter: Payer: Medicare Other | Admitting: Physical Therapy

## 2022-11-11 ENCOUNTER — Encounter: Payer: Medicare Other | Admitting: Physical Therapy

## 2023-01-14 ENCOUNTER — Other Ambulatory Visit: Payer: Self-pay | Admitting: Orthopedic Surgery

## 2023-01-14 DIAGNOSIS — Z96612 Presence of left artificial shoulder joint: Secondary | ICD-10-CM

## 2023-01-24 ENCOUNTER — Ambulatory Visit
Admission: RE | Admit: 2023-01-24 | Discharge: 2023-01-24 | Disposition: A | Discharge status: Home / Self Care | Payer: Medicare Other | Source: Ambulatory Visit | Attending: Orthopedic Surgery | Admitting: Orthopedic Surgery

## 2023-01-24 DIAGNOSIS — Z96612 Presence of left artificial shoulder joint: Secondary | ICD-10-CM | POA: Diagnosis present

## 2023-01-28 ENCOUNTER — Other Ambulatory Visit: Payer: Self-pay | Admitting: Orthopedic Surgery

## 2023-01-28 DIAGNOSIS — Z96612 Presence of left artificial shoulder joint: Secondary | ICD-10-CM

## 2023-02-15 ENCOUNTER — Ambulatory Visit
Admission: RE | Admit: 2023-02-15 | Discharge: 2023-02-15 | Disposition: A | Discharge status: Home / Self Care | Payer: Medicare Other | Source: Ambulatory Visit | Attending: Orthopedic Surgery | Admitting: Orthopedic Surgery

## 2023-02-15 DIAGNOSIS — Z96612 Presence of left artificial shoulder joint: Secondary | ICD-10-CM | POA: Insufficient documentation

## 2023-04-12 ENCOUNTER — Emergency Department: Payer: Medicare Other | Admitting: General Practice

## 2023-04-12 ENCOUNTER — Other Ambulatory Visit: Payer: Self-pay

## 2023-04-12 ENCOUNTER — Encounter
Admission: EM | Disposition: A | Discharge status: Home Health | Payer: Self-pay | Source: Home / Self Care | Attending: Surgery

## 2023-04-12 ENCOUNTER — Inpatient Hospital Stay
Admission: EM | Admit: 2023-04-12 | Discharge: 2023-04-17 | DRG: 329 | Disposition: A | Discharge status: Home Health | Payer: Medicare Other | Attending: Surgery | Admitting: Surgery

## 2023-04-12 ENCOUNTER — Emergency Department: Payer: Medicare Other

## 2023-04-12 DIAGNOSIS — E876 Hypokalemia: Secondary | ICD-10-CM | POA: Diagnosis present

## 2023-04-12 DIAGNOSIS — K219 Gastro-esophageal reflux disease without esophagitis: Secondary | ICD-10-CM | POA: Diagnosis present

## 2023-04-12 DIAGNOSIS — I739 Peripheral vascular disease, unspecified: Secondary | ICD-10-CM | POA: Diagnosis present

## 2023-04-12 DIAGNOSIS — K460 Unspecified abdominal hernia with obstruction, without gangrene: Secondary | ICD-10-CM | POA: Diagnosis present

## 2023-04-12 DIAGNOSIS — G14 Postpolio syndrome: Secondary | ICD-10-CM | POA: Diagnosis present

## 2023-04-12 DIAGNOSIS — Z888 Allergy status to other drugs, medicaments and biological substances status: Secondary | ICD-10-CM | POA: Diagnosis not present

## 2023-04-12 DIAGNOSIS — Z83511 Family history of glaucoma: Secondary | ICD-10-CM

## 2023-04-12 DIAGNOSIS — G4700 Insomnia, unspecified: Secondary | ICD-10-CM | POA: Diagnosis present

## 2023-04-12 DIAGNOSIS — Z7951 Long term (current) use of inhaled steroids: Secondary | ICD-10-CM | POA: Diagnosis not present

## 2023-04-12 DIAGNOSIS — J439 Emphysema, unspecified: Secondary | ICD-10-CM | POA: Diagnosis present

## 2023-04-12 DIAGNOSIS — Z981 Arthrodesis status: Secondary | ICD-10-CM

## 2023-04-12 DIAGNOSIS — Z8261 Family history of arthritis: Secondary | ICD-10-CM

## 2023-04-12 DIAGNOSIS — Z7982 Long term (current) use of aspirin: Secondary | ICD-10-CM | POA: Diagnosis not present

## 2023-04-12 DIAGNOSIS — Z791 Long term (current) use of non-steroidal anti-inflammatories (NSAID): Secondary | ICD-10-CM

## 2023-04-12 DIAGNOSIS — E872 Acidosis, unspecified: Secondary | ICD-10-CM | POA: Diagnosis present

## 2023-04-12 DIAGNOSIS — G4730 Sleep apnea, unspecified: Secondary | ICD-10-CM | POA: Diagnosis present

## 2023-04-12 DIAGNOSIS — Z7983 Long term (current) use of bisphosphonates: Secondary | ICD-10-CM

## 2023-04-12 DIAGNOSIS — G20A1 Parkinson's disease without dyskinesia, without mention of fluctuations: Secondary | ICD-10-CM | POA: Diagnosis present

## 2023-04-12 DIAGNOSIS — Z801 Family history of malignant neoplasm of trachea, bronchus and lung: Secondary | ICD-10-CM

## 2023-04-12 DIAGNOSIS — Z809 Family history of malignant neoplasm, unspecified: Secondary | ICD-10-CM

## 2023-04-12 DIAGNOSIS — Z87891 Personal history of nicotine dependence: Secondary | ICD-10-CM

## 2023-04-12 DIAGNOSIS — M810 Age-related osteoporosis without current pathological fracture: Secondary | ICD-10-CM | POA: Diagnosis present

## 2023-04-12 DIAGNOSIS — F32A Depression, unspecified: Secondary | ICD-10-CM | POA: Diagnosis present

## 2023-04-12 DIAGNOSIS — K56609 Unspecified intestinal obstruction, unspecified as to partial versus complete obstruction: Secondary | ICD-10-CM | POA: Diagnosis present

## 2023-04-12 DIAGNOSIS — E785 Hyperlipidemia, unspecified: Secondary | ICD-10-CM | POA: Diagnosis present

## 2023-04-12 DIAGNOSIS — K55029 Acute infarction of small intestine, extent unspecified: Secondary | ICD-10-CM | POA: Diagnosis present

## 2023-04-12 DIAGNOSIS — Z79899 Other long term (current) drug therapy: Secondary | ICD-10-CM

## 2023-04-12 DIAGNOSIS — Z803 Family history of malignant neoplasm of breast: Secondary | ICD-10-CM

## 2023-04-12 DIAGNOSIS — K469 Unspecified abdominal hernia without obstruction or gangrene: Secondary | ICD-10-CM | POA: Diagnosis present

## 2023-04-12 DIAGNOSIS — Z923 Personal history of irradiation: Secondary | ICD-10-CM

## 2023-04-12 DIAGNOSIS — Z811 Family history of alcohol abuse and dependence: Secondary | ICD-10-CM

## 2023-04-12 DIAGNOSIS — Z8249 Family history of ischemic heart disease and other diseases of the circulatory system: Secondary | ICD-10-CM | POA: Diagnosis not present

## 2023-04-12 DIAGNOSIS — Z9011 Acquired absence of right breast and nipple: Secondary | ICD-10-CM

## 2023-04-12 DIAGNOSIS — Z83438 Family history of other disorder of lipoprotein metabolism and other lipidemia: Secondary | ICD-10-CM

## 2023-04-12 DIAGNOSIS — I10 Essential (primary) hypertension: Secondary | ICD-10-CM | POA: Diagnosis present

## 2023-04-12 DIAGNOSIS — Z833 Family history of diabetes mellitus: Secondary | ICD-10-CM

## 2023-04-12 DIAGNOSIS — Z823 Family history of stroke: Secondary | ICD-10-CM

## 2023-04-12 DIAGNOSIS — K461 Unspecified abdominal hernia with gangrene: Principal | ICD-10-CM | POA: Diagnosis present

## 2023-04-12 DIAGNOSIS — Z79818 Long term (current) use of other agents affecting estrogen receptors and estrogen levels: Secondary | ICD-10-CM

## 2023-04-12 DIAGNOSIS — E559 Vitamin D deficiency, unspecified: Secondary | ICD-10-CM | POA: Diagnosis present

## 2023-04-12 DIAGNOSIS — Z853 Personal history of malignant neoplasm of breast: Secondary | ICD-10-CM

## 2023-04-12 DIAGNOSIS — Z860100 Personal history of colon polyps, unspecified: Secondary | ICD-10-CM

## 2023-04-12 DIAGNOSIS — Z8262 Family history of osteoporosis: Secondary | ICD-10-CM

## 2023-04-12 HISTORY — PX: LAPAROTOMY: SHX154

## 2023-04-12 HISTORY — PX: BOWEL RESECTION: SHX1257

## 2023-04-12 LAB — LACTIC ACID, PLASMA
Lactic Acid, Venous: 3.2 mmol/L (ref 0.5–1.9)
Lactic Acid, Venous: 2.9 mmol/L (ref 0.5–1.9)

## 2023-04-12 LAB — COMPREHENSIVE METABOLIC PANEL
ALT: 17 U/L (ref 0–44)
AST: 26 U/L (ref 15–41)
Albumin: 4.2 g/dL (ref 3.5–5.0)
Alkaline Phosphatase: 95 U/L (ref 38–126)
Anion gap: 11 (ref 5–15)
BUN: 25 mg/dL — ABNORMAL HIGH (ref 8–23)
CO2: 23 mmol/L (ref 22–32)
Calcium: 10.6 mg/dL — ABNORMAL HIGH (ref 8.9–10.3)
Chloride: 100 mmol/L (ref 98–111)
Creatinine, Ser: 0.67 mg/dL (ref 0.44–1.00)
GFR, Estimated: >60 mL/min (ref >60)
Glucose, Bld: 173 mg/dL — ABNORMAL HIGH (ref 70–99)
Potassium: 3.5 mmol/L (ref 3.5–5.1)
Sodium: 134 mmol/L — ABNORMAL LOW (ref 135–145)
Total Bilirubin: 1.2 mg/dL — ABNORMAL HIGH (ref <1.2)
Total Protein: 7.7 g/dL (ref 6.5–8.1)

## 2023-04-12 LAB — CBC
HCT: 30.1 *Deleted — ABNORMAL LOW (ref 36.0–46.0)
Hemoglobin: 10 g/dL — ABNORMAL LOW (ref 12.0–15.0)
MCH: 30.5 pg (ref 26.0–34.0)
MCHC: 33.2 g/dL (ref 30.0–36.0)
MCV: 91.8 fL (ref 80.0–100.0)
Platelets: 246 10*3/uL (ref 150–400)
RBC: 3.28 MIL/uL — ABNORMAL LOW (ref 3.87–5.11)
RDW: 13.9 *Deleted (ref 11.5–15.5)
WBC: 9.8 10*3/uL (ref 4.0–10.5)
nRBC: 0 *Deleted (ref 0.0–0.2)

## 2023-04-12 LAB — CBC WITH DIFFERENTIAL/PLATELET
Abs Immature Granulocytes: 0.08 10*3/uL — ABNORMAL HIGH (ref 0.00–0.07)
Basophils Absolute: 0.1 10*3/uL (ref 0.0–0.1)
Basophils Relative: 0 %
Eosinophils Absolute: 0 10*3/uL (ref 0.0–0.5)
Eosinophils Relative: 0 %
HCT: 41.7 % (ref 36.0–46.0)
Hemoglobin: 14.4 g/dL (ref 12.0–15.0)
Immature Granulocytes: 1 %
Lymphocytes Relative: 6 %
Lymphs Abs: 1 10*3/uL (ref 0.7–4.0)
MCH: 30.5 pg (ref 26.0–34.0)
MCHC: 34.5 g/dL (ref 30.0–36.0)
MCV: 88.3 fL (ref 80.0–100.0)
Monocytes Absolute: 0.9 10*3/uL (ref 0.1–1.0)
Monocytes Relative: 5 %
Neutro Abs: 15.2 10*3/uL — ABNORMAL HIGH (ref 1.7–7.7)
Neutrophils Relative %: 88 %
Platelets: 324 10*3/uL (ref 150–400)
RBC: 4.72 MIL/uL (ref 3.87–5.11)
RDW: 13.7 % (ref 11.5–15.5)
WBC: 17.2 10*3/uL — ABNORMAL HIGH (ref 4.0–10.5)
nRBC: 0 % (ref 0.0–0.2)

## 2023-04-12 LAB — CREATININE, SERUM
Creatinine, Ser: 0.59 mg/dL (ref 0.44–1.00)
GFR, Estimated: >60 mL/min (ref >60)

## 2023-04-12 LAB — LIPASE, BLOOD: Lipase: 21 U/L (ref 11–51)

## 2023-04-12 SURGERY — LAPAROTOMY, EXPLORATORY
Anesthesia: General

## 2023-04-12 MED ORDER — DEXAMETHASONE SODIUM PHOSPHATE 10 MG/ML IJ SOLN
INTRAMUSCULAR | Status: DC | PRN
  Administered 2023-04-12: 10 mg via INTRAVENOUS

## 2023-04-12 MED ORDER — MORPHINE SULFATE (PF) 2 MG/ML IV SOLN
2.0000 mg | INTRAVENOUS | Status: DC | PRN
  Administered 2023-04-13 – 2023-04-14 (×5): 2 mg via INTRAVENOUS
  Filled 2023-04-12 (×6): qty 1

## 2023-04-12 MED ORDER — FENTANYL CITRATE (PF) 100 MCG/2ML IJ SOLN
INTRAMUSCULAR | Status: DC | PRN
  Administered 2023-04-12 (×2): 25 ug via INTRAVENOUS
  Administered 2023-04-12: 50 ug via INTRAVENOUS

## 2023-04-12 MED ORDER — SODIUM CHLORIDE 0.9 % IV SOLN
2.0000 g | Freq: Three times a day (TID) | INTRAVENOUS | Status: AC
  Administered 2023-04-13 (×3): 2 g via INTRAVENOUS
  Filled 2023-04-12 (×3): qty 2

## 2023-04-12 MED ORDER — ENOXAPARIN SODIUM 40 MG/0.4ML IJ SOSY
40.0000 mg | PREFILLED_SYRINGE | INTRAMUSCULAR | Status: DC
Start: 2023-04-13 — End: 2023-04-14
  Administered 2023-04-13: 40 mg via SUBCUTANEOUS
  Filled 2023-04-12: qty 0.4

## 2023-04-12 MED ORDER — ONDANSETRON HCL 4 MG/2ML IJ SOLN
4.0000 mg | Freq: Once | INTRAMUSCULAR | Status: AC
  Administered 2023-04-12: 4 mg via INTRAVENOUS
  Filled 2023-04-12: qty 2

## 2023-04-12 MED ORDER — BUPIVACAINE LIPOSOME 1.3 % IJ SUSP
INTRAMUSCULAR | Status: AC
  Filled 2023-04-12: qty 20

## 2023-04-12 MED ORDER — PIPERACILLIN-TAZOBACTAM 3.375 G IVPB 30 MIN
3.3750 g | Freq: Once | INTRAVENOUS | Status: AC
  Administered 2023-04-12: 3.375 g via INTRAVENOUS
  Filled 2023-04-12 (×2): qty 50

## 2023-04-12 MED ORDER — HYDROMORPHONE HCL 1 MG/ML IJ SOLN
INTRAMUSCULAR | Status: AC
  Filled 2023-04-12: qty 1

## 2023-04-12 MED ORDER — CEFAZOLIN SODIUM 1 G IJ SOLR
INTRAMUSCULAR | Status: AC
  Filled 2023-04-12: qty 20

## 2023-04-12 MED ORDER — DIPHENHYDRAMINE HCL 12.5 MG/5ML PO ELIX: 12.5000 mg | ORAL_SOLUTION | Freq: Four times a day (QID) | ORAL | Status: DC | PRN

## 2023-04-12 MED ORDER — KETOROLAC TROMETHAMINE 15 MG/ML IJ SOLN
15.0000 mg | Freq: Four times a day (QID) | INTRAMUSCULAR | Status: DC
  Administered 2023-04-13 – 2023-04-17 (×16): 15 mg via INTRAVENOUS
  Filled 2023-04-12 (×16): qty 1

## 2023-04-12 MED ORDER — BUPIVACAINE-EPINEPHRINE (PF) 0.25% -1:200000 IJ SOLN
INTRAMUSCULAR | Status: AC
  Filled 2023-04-12: qty 30

## 2023-04-12 MED ORDER — PROCHLORPERAZINE EDISYLATE 10 MG/2ML IJ SOLN: 5.0000 mg | Freq: Four times a day (QID) | INTRAMUSCULAR | Status: DC | PRN

## 2023-04-12 MED ORDER — ROCURONIUM BROMIDE 10 MG/ML (PF) SYRINGE
PREFILLED_SYRINGE | INTRAVENOUS | Status: AC
  Filled 2023-04-12: qty 10

## 2023-04-12 MED ORDER — ACETAMINOPHEN 10 MG/ML IV SOLN
INTRAVENOUS | Status: AC
  Filled 2023-04-12: qty 100

## 2023-04-12 MED ORDER — SUCCINYLCHOLINE CHLORIDE 200 MG/10ML IV SOSY
PREFILLED_SYRINGE | INTRAVENOUS | Status: DC | PRN
  Administered 2023-04-12: 80 mg via INTRAVENOUS

## 2023-04-12 MED ORDER — ACETAMINOPHEN 10 MG/ML IV SOLN
INTRAVENOUS | Status: DC | PRN
  Administered 2023-04-12: 1000 mg via INTRAVENOUS

## 2023-04-12 MED ORDER — SODIUM CHLORIDE 0.9 % IV BOLUS
500.0000 mL | Freq: Once | INTRAVENOUS | Status: AC
Start: 2023-04-12 — End: 2023-04-12
  Administered 2023-04-12: 500 mL via INTRAVENOUS

## 2023-04-12 MED ORDER — LIDOCAINE HCL (CARDIAC) PF 100 MG/5ML IV SOSY
PREFILLED_SYRINGE | INTRAVENOUS | Status: DC | PRN
  Administered 2023-04-12: 40 mg via INTRAVENOUS

## 2023-04-12 MED ORDER — SODIUM CHLORIDE 0.9 % IV SOLN: INTRAVENOUS | Status: DC

## 2023-04-12 MED ORDER — HYDROMORPHONE HCL 1 MG/ML IJ SOLN
INTRAMUSCULAR | Status: DC | PRN
  Administered 2023-04-12: .5 mg via INTRAVENOUS

## 2023-04-12 MED ORDER — MORPHINE SULFATE (PF) 4 MG/ML IV SOLN
4.0000 mg | Freq: Once | INTRAVENOUS | Status: AC
  Administered 2023-04-12: 4 mg via INTRAVENOUS
  Filled 2023-04-12: qty 1

## 2023-04-12 MED ORDER — FENTANYL CITRATE (PF) 100 MCG/2ML IJ SOLN
INTRAMUSCULAR | Status: AC
  Filled 2023-04-12: qty 2

## 2023-04-12 MED ORDER — DEXAMETHASONE SODIUM PHOSPHATE 10 MG/ML IJ SOLN
INTRAMUSCULAR | Status: AC
  Filled 2023-04-12: qty 1

## 2023-04-12 MED ORDER — SODIUM CHLORIDE 0.9 % IV SOLN: Freq: Once | INTRAVENOUS | Status: AC

## 2023-04-12 MED ORDER — LACTATED RINGERS IV SOLN: INTRAVENOUS | Status: DC | PRN

## 2023-04-12 MED ORDER — ONDANSETRON HCL 4 MG/2ML IJ SOLN
INTRAMUSCULAR | Status: DC | PRN
  Administered 2023-04-12: 4 mg via INTRAVENOUS

## 2023-04-12 MED ORDER — DEXMEDETOMIDINE HCL IN NACL 80 MCG/20ML IV SOLN
INTRAVENOUS | Status: DC | PRN
  Administered 2023-04-12: 4 ug via INTRAVENOUS

## 2023-04-12 MED ORDER — OXYCODONE HCL 5 MG PO TABS: 5.0000 mg | ORAL_TABLET | Freq: Once | ORAL | Status: DC | PRN

## 2023-04-12 MED ORDER — SODIUM CHLORIDE 0.9 % IV SOLN: INTRAVENOUS | Status: DC | PRN

## 2023-04-12 MED ORDER — DIPHENHYDRAMINE HCL 50 MG/ML IJ SOLN: 12.5000 mg | Freq: Four times a day (QID) | INTRAMUSCULAR | Status: DC | PRN

## 2023-04-12 MED ORDER — FENTANYL CITRATE (PF) 100 MCG/2ML IJ SOLN: 25.0000 ug | INTRAMUSCULAR | Status: DC | PRN

## 2023-04-12 MED ORDER — SODIUM CHLORIDE 0.9 % IV SOLN
12.5000 mg | Freq: Once | INTRAVENOUS | Status: AC
  Administered 2023-04-12: 12.5 mg via INTRAVENOUS
  Filled 2023-04-12: qty 12.5

## 2023-04-12 MED ORDER — PROCHLORPERAZINE MALEATE 10 MG PO TABS: 10.0000 mg | ORAL_TABLET | Freq: Four times a day (QID) | ORAL | Status: DC | PRN

## 2023-04-12 MED ORDER — CEFAZOLIN SODIUM-DEXTROSE 2-3 GM-%(50ML) IV SOLR
INTRAVENOUS | Status: DC | PRN
  Administered 2023-04-12: 2 g via INTRAVENOUS

## 2023-04-12 MED ORDER — SUGAMMADEX SODIUM 200 MG/2ML IV SOLN
INTRAVENOUS | Status: DC | PRN
  Administered 2023-04-12: 150 mg via INTRAVENOUS

## 2023-04-12 MED ORDER — SODIUM CHLORIDE (PF) 0.9 % IJ SOLN
INTRAMUSCULAR | Status: AC
  Filled 2023-04-12: qty 50

## 2023-04-12 MED ORDER — 0.9 % SODIUM CHLORIDE (POUR BTL) OPTIME
TOPICAL | Status: DC | PRN
  Administered 2023-04-12: 2000 mL

## 2023-04-12 MED ORDER — IOHEXOL 300 MG/ML  SOLN
100.0000 mL | Freq: Once | INTRAMUSCULAR | Status: AC | PRN
  Administered 2023-04-12: 100 mL via INTRAVENOUS

## 2023-04-12 MED ORDER — PROPOFOL 10 MG/ML IV BOLUS
INTRAVENOUS | Status: DC | PRN
  Administered 2023-04-12: 80 mg via INTRAVENOUS

## 2023-04-12 MED ORDER — PANTOPRAZOLE SODIUM 40 MG IV SOLR
40.0000 mg | Freq: Two times a day (BID) | INTRAVENOUS | Status: DC
  Administered 2023-04-12 – 2023-04-17 (×10): 40 mg via INTRAVENOUS
  Filled 2023-04-12 (×10): qty 10

## 2023-04-12 MED ORDER — GLYCOPYRROLATE 0.2 MG/ML IJ SOLN
INTRAMUSCULAR | Status: AC
  Filled 2023-04-12: qty 1

## 2023-04-12 MED ORDER — ACETAMINOPHEN 500 MG PO TABS
1000.0000 mg | ORAL_TABLET | Freq: Four times a day (QID) | ORAL | Status: DC
  Administered 2023-04-13 – 2023-04-14 (×2): 1000 mg via ORAL
  Filled 2023-04-12 (×4): qty 2

## 2023-04-12 MED ORDER — SODIUM CHLORIDE 0.9 % IV SOLN
INTRAVENOUS | Status: DC | PRN
  Administered 2023-04-12: 70 mL

## 2023-04-12 MED ORDER — ONDANSETRON HCL 4 MG/2ML IJ SOLN
INTRAMUSCULAR | Status: AC
  Filled 2023-04-12: qty 2

## 2023-04-12 MED ORDER — ROCURONIUM BROMIDE 100 MG/10ML IV SOLN
INTRAVENOUS | Status: DC | PRN
  Administered 2023-04-12: 30 mg via INTRAVENOUS

## 2023-04-12 MED ORDER — LIDOCAINE HCL (PF) 2 % IJ SOLN
INTRAMUSCULAR | Status: AC
  Filled 2023-04-12: qty 5

## 2023-04-12 MED ORDER — MELATONIN 5 MG PO TABS
2.5000 mg | ORAL_TABLET | Freq: Every evening | ORAL | Status: DC | PRN
  Administered 2023-04-13: 2.5 mg via ORAL
  Filled 2023-04-12 (×2): qty 1

## 2023-04-12 MED ORDER — OXYCODONE HCL 5 MG/5ML PO SOLN: 5.0000 mg | Freq: Once | ORAL | Status: DC | PRN

## 2023-04-12 MED ORDER — BUPIVACAINE-EPINEPHRINE (PF) 0.25% -1:200000 IJ SOLN
INTRAMUSCULAR | Status: DC | PRN
  Administered 2023-04-12: 30 mL via PERINEURAL

## 2023-04-12 MED ORDER — HYDROMORPHONE HCL 1 MG/ML IJ SOLN
0.5000 mg | Freq: Once | INTRAMUSCULAR | Status: AC
  Administered 2023-04-12: 0.5 mg via INTRAVENOUS
  Filled 2023-04-12: qty 0.5

## 2023-04-12 MED ORDER — OXYCODONE HCL 5 MG PO TABS
5.0000 mg | ORAL_TABLET | ORAL | Status: DC | PRN
  Administered 2023-04-14 (×2): 5 mg via ORAL
  Administered 2023-04-15 – 2023-04-17 (×6): 10 mg via ORAL
  Filled 2023-04-12: qty 2
  Filled 2023-04-12 (×2): qty 1
  Filled 2023-04-12 (×5): qty 2

## 2023-04-12 MED ORDER — SUCCINYLCHOLINE CHLORIDE 200 MG/10ML IV SOSY
PREFILLED_SYRINGE | INTRAVENOUS | Status: AC
  Filled 2023-04-12: qty 10

## 2023-04-12 SURGICAL SUPPLY — 46 items
APPLIER CLIP 11 MED OPEN (CLIP)
APPLIER CLIP 13 LRG OPEN (CLIP)
BARRIER ADH SEPRAFILM 3INX5IN (MISCELLANEOUS) IMPLANT
BLADE CLIPPER SURG (BLADE) ×1 IMPLANT
CHLORAPREP W/TINT 26 (MISCELLANEOUS) ×1 IMPLANT
CLIP APPLIE 11 MED OPEN (CLIP) IMPLANT
CLIP APPLIE 13 LRG OPEN (CLIP) IMPLANT
DRAPE LAPAROTOMY 100X77 ABD (DRAPES) ×1 IMPLANT
DRAPE TABLE BACK 80X90 (DRAPES) IMPLANT
DRAPE WARM FLUID 44X44 (DRAPES) IMPLANT
DRSG OPSITE POSTOP 4X10 (GAUZE/BANDAGES/DRESSINGS) IMPLANT
DRSG OPSITE POSTOP 4X12 (GAUZE/BANDAGES/DRESSINGS) IMPLANT
ELECT BLADE 6.5 EXT (BLADE) ×1 IMPLANT
ELECT REM PT RETURN 9FT ADLT (ELECTROSURGICAL) ×1
ELECTRODE REM PT RTRN 9FT ADLT (ELECTROSURGICAL) ×1 IMPLANT
GLOVE BIO SURGEON STRL SZ7 (GLOVE) ×2 IMPLANT
GOWN STRL REUS W/ TWL LRG LVL3 (GOWN DISPOSABLE) ×2 IMPLANT
HANDLE YANKAUER SUCT BULB TIP (MISCELLANEOUS) IMPLANT
JET LAVAGE IRRISEPT WOUND (IRRIGATION / IRRIGATOR)
LAVAGE JET IRRISEPT WOUND (IRRIGATION / IRRIGATOR) IMPLANT
LIGASURE IMPACT 36 18CM CVD LR (INSTRUMENTS) IMPLANT
MANIFOLD NEPTUNE II (INSTRUMENTS) ×1 IMPLANT
NDL HYPO 22X1.5 SAFETY MO (MISCELLANEOUS) ×2 IMPLANT
NEEDLE HYPO 22X1.5 SAFETY MO (MISCELLANEOUS) ×1 IMPLANT
PACK BASIN MAJOR ARMC (MISCELLANEOUS) ×1 IMPLANT
RELOAD PROXIMATE 75MM BLUE (ENDOMECHANICALS) ×3 IMPLANT
RELOAD STAPLE 75 3.8 BLU REG (ENDOMECHANICALS) IMPLANT
SPONGE T-LAP 18X18 ~~LOC~~+RFID (SPONGE) ×1 IMPLANT
SPONGE T-LAP 18X36 ~~LOC~~+RFID STR (SPONGE) IMPLANT
STAPLER PROXIMATE 75MM BLUE (STAPLE) IMPLANT
STAPLER SKIN PROX 35W (STAPLE) ×1 IMPLANT
SUT PDS AB 0 CT1 27 (SUTURE) ×3 IMPLANT
SUT PDS PLUS AB 0 CT-2 (SUTURE) IMPLANT
SUT SILK 2 0 SH CR/8 (SUTURE) ×1 IMPLANT
SUT SILK 2 0SH CR/8 30 (SUTURE) ×1 IMPLANT
SUT SILK 2-0 18XBRD TIE 12 (SUTURE) ×1 IMPLANT
SUT SILK 3 0 SH CR/8 (SUTURE) IMPLANT
SUT VIC AB 0 CT1 36 (SUTURE) ×2 IMPLANT
SUT VIC AB 2-0 SH 27XBRD (SUTURE) IMPLANT
SUT VIC AB 3-0 SH 27X BRD (SUTURE) ×1 IMPLANT
SWAB CULTURE AMIES ANAERIB BLU (MISCELLANEOUS) IMPLANT
SYR 20ML LL LF (SYRINGE) ×2 IMPLANT
SYR 3ML LL SCALE MARK (SYRINGE) ×1 IMPLANT
TRAP FLUID SMOKE EVACUATOR (MISCELLANEOUS) ×1 IMPLANT
TRAY FOLEY MTR SLVR 16FR STAT (SET/KITS/TRAYS/PACK) ×1 IMPLANT
WATER STERILE IRR 500ML POUR (IV SOLUTION) ×1 IMPLANT

## 2023-04-12 NOTE — Anesthesia Postprocedure Evaluation (Signed)
Anesthesia Post Note  Patient: JOSIANE REIFER  Procedure(s) Performed: EXPLORATORY LAPAROTOMY SMALL BOWEL RESECTION  Patient location during evaluation: PACU Anesthesia Type: General Level of consciousness: awake and alert Pain management: pain level controlled Vital Signs Assessment: post-procedure vital signs reviewed and stable Respiratory status: spontaneous breathing, nonlabored ventilation, respiratory function stable and patient connected to nasal cannula oxygen Cardiovascular status: blood pressure returned to baseline and stable Postop Assessment: no apparent nausea or vomiting Anesthetic complications: no  No notable events documented.   Last Vitals:  Vitals:   04/12/23 2151 04/12/23 2225  BP: 129/61 125/71  Pulse: 80 82  Resp: 12 16  Temp: 36.5 C (!) 36.4 C  SpO2: 100% 98%    Last Pain:  Vitals:   04/12/23 2225  TempSrc: Oral  PainSc:                  Stephanie Coup

## 2023-04-12 NOTE — ED Notes (Signed)
Pt complaining of recurrent nausea. MD notified.

## 2023-04-12 NOTE — Op Note (Signed)
Patient ID: Andrea [name], female   DOB: [birth year], 72 y.o.   MRN: [id number]  HPI Andrea [name] is a 72 y.o. female seen in consultation at the request of Dr. Cyril Loosen she does have significant history of COPD on inhalers, prior breast cancer, Parkinson disease.  Recent left elbow surgery x 3.  She is still wearing the sling She presents with a 3-day history of severe abdominal pain that is intermittent and now progressively getting worse.  Now is severe and constant.  It is sharp located in the mid abdomen.  Seems to be worsening with movement.  No fevers no chills.  She also reports multiple episode of brown emesis.  She denies any prior abdominal operations SHe has been taking chronic narcotic for elbow surgery. She is acidotic and w increase WBC, CT pers reviewed showing dilated SB loops w pneumatosis and concerns for close loop obstruction vs ischemia.  Sodium 134 (*)       Glucose, Bld 173 (*)      BUN 25 (*)      Calcium 10.6 (*)      Total Bilirubin 1.2 (*)      All other components within normal limits  CBC WITH DIFFERENTIAL/PLATELET - Abnormal; Notable for the following components:    WBC 17.2 (*)      Neutro Abs 15.2 (*)      Abs Immature Granulocytes 0.08 (*)      All other components within normal limits  LACTIC ACID, PLASMA - Abnormal; Notable for the following components:    Lactic Acid, Venous 3.2 (*)         HPI  Past Medical History:  Diagnosis Date   Allergic rhinitis due to allergen    Allergy    Breast cancer (HCC) 2008   RT LUMPECTOMY   Breast cancer (HCC) 2011   RT MASTECTOMY   Cancer (HCC) 8469,6295     High-grade DCIS,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   Cervical post-laminectomy syndrome    Chronic constipation    Colon polyp 2010   Complication of anesthesia    itching real bad or msucles spasms   COPD (chronic obstructive pulmonary disease) (HCC) 02/21/2019   Cubital tunnel syndrome    Depression    Disorders of sacrum    Disturbance of  skin sensation    Emphysema lung (HCC) 2020   GERD (gastroesophageal reflux disease)    Heart murmur    History of abnormal cervical Pap smear    History of cataract    History of colon polyps    History of pneumonia 2008   Hot flashes    Hx of diplopia    right eye   Hyperlipidemia    Hypertension    Insomnia    Late effects of acute poliomyelitis    Lateral epicondylitis  of elbow    Loss of memory    Menopausal state    Neck pain    Osteoporosis    Pain in joint of left shoulder    Parkinson's disease (HCC) 2011   Peripheral vascular disease (HCC) 2011   venous stasis    Personal history of radiation therapy    PONV (postoperative nausea and vomiting)    Post-polio syndrome    Radiation 2008   BREAST CA   Sleep apnea    Tremor    Unspecified musculoskeletal disorders and symptoms referable to neck    cervical/trapezius   Vitamin D deficiency     Past Surgical  History:  Procedure Laterality Date   ABDOMINAL HYSTERECTOMY  1997   ANTERIOR CERVICAL DECOMP/DISCECTOMY FUSION N/A 02/01/2019   Procedure: Anterior Cervical Decompression Fusion Cervical three-four, Cervical four-five, hardware removal Cervical five-six;  Surgeon: Julio Sicks, MD;  Location: [facility] OR;  Service: Neurosurgery;  Laterality: N/A;   BREAST LUMPECTOMY Right 2008   4 cm area of DCIS, margins less than 1 mm. Treated with wide excision, whole breast radiation   BREAST SURGERY Right 12/04/2009   Right simple mastectomy, sentinel node biopsy.   CARPAL TUNNEL RELEASE     COLONOSCOPY  2010,2015   Dr. Servando Snare, Dr. Mechele Collin   COLONOSCOPY     COLONOSCOPY N/A 06/18/2022   Procedure: COLONOSCOPY;  Surgeon: Jaynie Collins, DO;  Location: [facility] ENDOSCOPY;  Service: Gastroenterology;  Laterality: N/A;   COLONOSCOPY N/A 10/07/2022   Procedure: COLONOSCOPY;  Surgeon: Jaynie Collins, DO;  Location: [facility] ENDOSCOPY;  Service: Gastroenterology;  Laterality: N/A;   COLONOSCOPY WITH PROPOFOL N/A 05/23/2017    Procedure: COLONOSCOPY WITH PROPOFOL;  Surgeon: Scot Jun, MD;  Location: [facility] ENDOSCOPY;  Service: Endoscopy;  Laterality: N/A;   ELBOW SURGERY Left 2011   ELBOW SURGERY Left 2015   ESOPHAGOGASTRODUODENOSCOPY     ESOPHAGOGASTRODUODENOSCOPY N/A 08/20/2021   Procedure: ESOPHAGOGASTRODUODENOSCOPY (EGD);  Surgeon: Jaynie Collins, DO;  Location: [facility] ENDOSCOPY;  Service: Gastroenterology;  Laterality: N/A;   FLEXIBLE SIGMOIDOSCOPY  07/19/2000   HIP SURGERY Right 06/19/2012   LEG SURGERY Right 1958   Corrcetive surgery for polio   LEG SURGERY Left 1958   corrective surgery for polio   MASTECTOMY Right 2011   BREAST CA   rectocele/enterocelle repair and perinoplasty     SHOULDER ARTHROSCOPY W/ ROTATOR CUFF REPAIR Bilateral 2010,2012   SHOULDER ARTHROSCOPY WITH OPEN ROTATOR CUFF REPAIR Left 08/19/2015   Procedure: SHOULDER ARTHROSCOPY WITH  MINI OPEN ROTATOR CUFF REPAIR,SUBACROMIAL DECOMPRESSION, ARTHROSCOPIC BICEPS TENODESIS;  Surgeon: Juanell Fairly, MD;  Location: [facility] ORS;  Service: Orthopedics;  Laterality: Left;   SHOULDER SURGERY Left 2014   SPINE SURGERY     TOE FUSION Left 1970   little toe fusion   TONSILLECTOMY     TUBAL LIGATION     two left foot surgeries:polio related      Family History  Problem Relation Age of Onset   Cancer Mother    Arthritis Mother    Hypertension Mother    Osteoporosis Mother    Glaucoma Mother    Diabetes Mother    Parkinsonism Mother    Lung cancer Mother    Hyperlipidemia Mother    Hip fracture Mother    Hyperlipidemia Father    Lung cancer Father    Hypertension Father    Cancer Father    Hyperlipidemia Sister    Diabetes Sister    Hypertension Sister    Rheumatic fever Sister    Alcohol abuse Sister    COPD Sister    Breast cancer Other 42   Stroke Maternal Aunt    Diabetes Maternal Uncle    Cancer Niece    Breast cancer Niece     Social History Social History   Tobacco Use   Smoking status: Former     Current packs/day: 0.00    Average packs/day: 1 pack/day for 50.0 years (50.0 ttl pk-yrs)    Types: Cigarettes    Start date: 07/19/1967    Quit date: 07/18/2017    Years since quitting: 5.7   Smokeless tobacco: Never   Tobacco comments:    Patient  states she is already working with PCP to quit  Vaping Use   Vaping status: Never Used  Substance Use Topics   Alcohol use: No   Drug use: No    Allergies  Allergen Reactions   Lisinopril     Raises BP    Current Facility-Administered Medications  Medication Dose Route Frequency Provider Last Rate Last Admin   piperacillin-tazobactam (ZOSYN) IVPB 3.375 g  3.375 g Intravenous Once Jene Every, MD 100 mL/hr at 04/12/23 1845 3.375 g at 04/12/23 1845   promethazine (PHENERGAN) 12.5 mg in sodium chloride 0.9 % 50 mL IVPB  12.5 mg Intravenous Once Jene Every, MD       Current Outpatient Medications  Medication Sig Dispense Refill   albuterol (VENTOLIN HFA) 108 (90 Base) MCG/ACT inhaler Inhale 2 puffs into the lungs every 6 (six) hours as needed for wheezing or shortness of breath. 8 g 2   amLODipine (NORVASC) 2.5 MG tablet Take 2.5 mg by mouth daily.     ANORO ELLIPTA 62.5-25 MCG/ACT AEPB Inhale 1 puff into the lungs daily.     aspirin EC 81 MG tablet Take 81 mg by mouth daily. Swallow whole.     atorvastatin (LIPITOR) 40 MG tablet Take 40 mg by mouth daily.     atorvastatin (LIPITOR) 80 MG tablet Take 80 mg by mouth daily.     calcium carbonate (TUMS EX) 750 MG chewable tablet Chew 1 tablet by mouth daily.     Cholecalciferol (D3 VITAMIN PO) Take by mouth daily.     diphenhydrAMINE (BENADRYL) 50 MG tablet Take 50 mg by mouth at bedtime as needed for itching.     DULoxetine (CYMBALTA) 60 MG capsule Take 60 mg by mouth daily.     estrogens, conjugated, (PREMARIN) 0.3 MG tablet Take 0.3 mg by mouth daily. Take daily for 21 days then do not take for 7 days.     Green Coffee Bean-Yerba Mate (GREEN COFFEE BEAN EXTRACT PO) Take 400 mg by  mouth.     HYDROcodone-acetaminophen (NORCO) 10-325 MG tablet Take 1 tablet by mouth every 6 (six) hours as needed for moderate pain.      losartan (COZAAR) 25 MG tablet Take 25 mg by mouth daily.     losartan (COZAAR) 25 MG tablet Take 1 tablet by mouth daily.     meloxicam (MOBIC) 7.5 MG tablet Take 7.5 mg by mouth 2 (two) times daily.      Multiple Vitamins-Minerals (PA WOMENS 50 PLUS VITAPAK PO) Take by mouth daily.     omeprazole (PRILOSEC) 40 MG capsule Take 40 mg by mouth daily.     omeprazole (PRILOSEC) 40 MG capsule Take by mouth.     oxyCODONE-acetaminophen (PERCOCET) 5-325 MG tablet Take 1 tablet by mouth every 4 (four) hours as needed for severe pain. 30 tablet 0   polyethylene glycol (MIRALAX / GLYCOLAX) packet Take 17 g by mouth daily as needed for moderate constipation.      potassium chloride SA (KLOR-CON M) 20 MEQ tablet potassium chloride ER 20 mEq tablet,extended release(part/cryst)  TAKE 1 TABLET BY MOUTH ONCE DAILY FOR 14 DAYS     pregabalin (LYRICA) 100 MG capsule Take 100 mg by mouth 3 (three) times daily.      pregabalin (LYRICA) 150 MG capsule Take by mouth.     tiZANidine (ZANAFLEX) 4 MG tablet Take 4 mg by mouth 4 (four) times daily as needed for muscle spasms.      tiZANidine (ZANAFLEX) 4 MG tablet  Take by mouth.     traZODone (DESYREL) 50 MG tablet Take 50 mg by mouth at bedtime as needed for sleep.  (Patient not taking: Reported on 10/07/2022)     triamcinolone (NASACORT) 55 MCG/ACT AERO nasal inhaler Place 2 sprays into the nose daily as needed (allergies).     zoledronic acid (RECLAST) 5 MG/100ML SOLN injection Inject 5 mg into the vein once.       Review of Systems Full ROS  was asked and was negative except for the information on the HPI  Physical Exam Blood pressure (!) 165/93, pulse 94, temperature 98.8 F (37.1 C), temperature source Oral, resp. rate 17, height 5' (1.524 m), weight 50.8 kg, SpO2 93%. CONSTITUTIONAL: chronically ill. Debilitated  EYES:  Pupils are equal, round, and reactive to light, Sclera are non-icteric. EARS, NOSE, MOUTH AND THROAT: The oropharynx is clear. The oral mucosa is pink and moist. Hearing is intact to voice. LYMPH NODES:  Lymph nodes in the neck are normal. RESPIRATORY:  Lungs are clear. There is normal respiratory effort, with equal breath sounds bilaterally, and without pathologic use of accessory muscles. CARDIOVASCULAR: Heart is regular without murmurs, gallops, or rubs. GI: The abdomen is distended, decrease BS. Obvious rebound and peritonitis diffusely, no obvious scars or hernias GU: Rectal deferred.   MUSCULOSKELETAL: Normal muscle strength and tone. No cyanosis or edema.   SKIN: Turgor is good and there are no pathologic skin lesions or ulcers. NEUROLOGIC: Motor and sensation is grossly normal. Cranial nerves are grossly intact. PSYCH:  Oriented to person, place and time. Affect is normal.  Data Reviewed  I have personally reviewed the patient's imaging, laboratory findings and medical records.    Assessment/Plan 72 year old female with acute abdomen consistent with bowel obstruction versus ischemic bowel.  She is clearly peritoneal and toxic with acidosis and increase lactate. She will need emergent exploratory laparotomy.  We have started broad-spectrum antibiotics as well as aggressive fluid resuscitation.  Cussed with the patient and the husband in detail about her disease process.  There is definitely increased risk of perioperative morbidity and even mortality.  On the other hand if we do not do anything she will die.  We discussed with him in detail about the possibility of multiple take backs and the possibility of a prolonged hospitalization with ICU care and multiple abdominal washouts.  Currently I am not sure what and what I do and he will depend on operative findings. Extensive counseling provided. I spent greater than 75 minutes in this encounter including personally reviewing imaging  studies, coordinating her care, placing orders and performing documentation  Sterling Big, MD FACS General Surgeon 04/12/2023, 7:08 PM

## 2023-04-12 NOTE — Transfer of Care (Signed)
Immediate Anesthesia Transfer of Care Note  Patient: SHANTISHA URBEN  Procedure(s) Performed: EXPLORATORY LAPAROTOMY SMALL BOWEL RESECTION  Patient Location: PACU  Anesthesia Type:General  Level of Consciousness: drowsy  Airway & Oxygen Therapy: Patient Spontanous Breathing and Patient connected to face mask oxygen  Post-op Assessment: Report given to RN and Post -op Vital signs reviewed and stable  Post vital signs: Reviewed  Last Vitals:  Vitals Value Taken Time  BP 136/62 04/12/23 2101  Temp    Pulse 82 04/12/23 2106  Resp 14 04/12/23 2106  SpO2 100 % 04/12/23 2106  Vitals shown include unfiled device data.  Last Pain:         Complications: No notable events documented.

## 2023-04-12 NOTE — ED Notes (Signed)
Pt reports morphine has not helped. MD notified.

## 2023-04-12 NOTE — ED Notes (Signed)
Pt requesting more pain medication. MD notified.

## 2023-04-12 NOTE — ED Notes (Signed)
Handoff report given to [place] in [place]: [phone]. Pt consent and stickers sent with patient for surgery.

## 2023-04-12 NOTE — ED Triage Notes (Signed)
Pt c/o possible bowel blockage, d/t severe abd pain x3 days with vomiting dark brown emesis and not being able to eat or drink. Pt reports small BM with hx of COPD.

## 2023-04-12 NOTE — H&P (Signed)
atient ID: [name], female   DOB: 07-10-1950, 72 y.o.   MRN: [id number]  HPI Andrea [name] is a 72 y.o. female seen in consultation at the request of Dr. Cyril Loosen she does have significant history of COPD on inhalers, prior breast cancer, Parkinson disease.  Recent left elbow surgery x 3.  She is still wearing the sling She presents with a 3-day history of severe abdominal pain that is intermittent and now progressively getting worse.  Now is severe and constant.  It is sharp located in the mid abdomen.  Seems to be worsening with movement.  No fevers no chills.  She also reports multiple episode of brown emesis.  She denies any prior abdominal operations SHe has been taking chronic narcotic for elbow surgery. She is acidotic and w increase WBC, CT pers reviewed showing dilated SB loops w pneumatosis and concerns for close loop obstruction vs ischemia.  Sodium 134 (*)       Glucose, Bld 173 (*)      BUN 25 (*)      Calcium 10.6 (*)      Total Bilirubin 1.2 (*)      All other components within normal limits  CBC WITH DIFFERENTIAL/PLATELET - Abnormal; Notable for the following components:    WBC 17.2 (*)      Neutro Abs 15.2 (*)      Abs Immature Granulocytes 0.08 (*)      All other components within normal limits  LACTIC ACID, PLASMA - Abnormal; Notable for the following components:    Lactic Acid, Venous 3.2 (*)         HPI  Past Medical History:  Diagnosis Date   Allergic rhinitis due to allergen    Allergy    Breast cancer (HCC) 2008   RT LUMPECTOMY   Breast cancer (HCC) 2011   RT MASTECTOMY   Cancer (HCC) 8315,1761     High-grade DCIS,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   Cervical post-laminectomy syndrome    Chronic constipation    Colon polyp 2010   Complication of anesthesia    itching real bad or msucles spasms   COPD (chronic obstructive pulmonary disease) (HCC) 02/21/2019   Cubital tunnel syndrome    Depression    Disorders of sacrum    Disturbance of  skin sensation    Emphysema lung (HCC) 2020   GERD (gastroesophageal reflux disease)    Heart murmur    History of abnormal cervical Pap smear    History of cataract    History of colon polyps    History of pneumonia 2008   Hot flashes    Hx of diplopia    right eye   Hyperlipidemia    Hypertension    Insomnia    Late effects of acute poliomyelitis    Lateral epicondylitis  of elbow    Loss of memory    Menopausal state    Neck pain    Osteoporosis    Pain in joint of left shoulder    Parkinson's disease (HCC) 2011   Peripheral vascular disease (HCC) 2011   venous stasis    Personal history of radiation therapy    PONV (postoperative nausea and vomiting)    Post-polio syndrome    Radiation 2008   BREAST CA   Sleep apnea    Tremor    Unspecified musculoskeletal disorders and symptoms referable to neck    cervical/trapezius   Vitamin D deficiency     Past Surgical  History:  Procedure Laterality Date   ABDOMINAL HYSTERECTOMY  1997   ANTERIOR CERVICAL DECOMP/DISCECTOMY FUSION N/A 02/01/2019   Procedure: Anterior Cervical Decompression Fusion Cervical three-four, Cervical four-five, hardware removal Cervical five-six;  Surgeon: Julio Sicks, MD;  Location: [facility] OR;  Service: Neurosurgery;  Laterality: N/A;   BREAST LUMPECTOMY Right 2008   4 cm area of DCIS, margins less than 1 mm. Treated with wide excision, whole breast radiation   BREAST SURGERY Right 12/04/2009   Right simple mastectomy, sentinel node biopsy.   CARPAL TUNNEL RELEASE     COLONOSCOPY  2010,2015   Dr. Servando Snare, Dr. Mechele Collin   COLONOSCOPY     COLONOSCOPY N/A 06/18/2022   Procedure: COLONOSCOPY;  Surgeon: Jaynie Collins, DO;  Location: [facility] ENDOSCOPY;  Service: Gastroenterology;  Laterality: N/A;   COLONOSCOPY N/A 10/07/2022   Procedure: COLONOSCOPY;  Surgeon: Jaynie Collins, DO;  Location: [facility] ENDOSCOPY;  Service: Gastroenterology;  Laterality: N/A;   COLONOSCOPY WITH PROPOFOL N/A 05/23/2017    Procedure: COLONOSCOPY WITH PROPOFOL;  Surgeon: Scot Jun, MD;  Location: [facility] ENDOSCOPY;  Service: Endoscopy;  Laterality: N/A;   ELBOW SURGERY Left 2011   ELBOW SURGERY Left 2015   ESOPHAGOGASTRODUODENOSCOPY     ESOPHAGOGASTRODUODENOSCOPY N/A 08/20/2021   Procedure: ESOPHAGOGASTRODUODENOSCOPY (EGD);  Surgeon: Jaynie Collins, DO;  Location: [facility] ENDOSCOPY;  Service: Gastroenterology;  Laterality: N/A;   FLEXIBLE SIGMOIDOSCOPY  07/19/2000   HIP SURGERY Right 06/19/2012   LEG SURGERY Right 1958   Corrcetive surgery for polio   LEG SURGERY Left 1958   corrective surgery for polio   MASTECTOMY Right 2011   BREAST CA   rectocele/enterocelle repair and perinoplasty     SHOULDER ARTHROSCOPY W/ ROTATOR CUFF REPAIR Bilateral 2010,2012   SHOULDER ARTHROSCOPY WITH OPEN ROTATOR CUFF REPAIR Left 08/19/2015   Procedure: SHOULDER ARTHROSCOPY WITH  MINI OPEN ROTATOR CUFF REPAIR,SUBACROMIAL DECOMPRESSION, ARTHROSCOPIC BICEPS TENODESIS;  Surgeon: Juanell Fairly, MD;  Location: [facility] ORS;  Service: Orthopedics;  Laterality: Left;   SHOULDER SURGERY Left 2014   SPINE SURGERY     TOE FUSION Left 1970   little toe fusion   TONSILLECTOMY     TUBAL LIGATION     two left foot surgeries:polio related      Family History  Problem Relation Age of Onset   Cancer Mother    Arthritis Mother    Hypertension Mother    Osteoporosis Mother    Glaucoma Mother    Diabetes Mother    Parkinsonism Mother    Lung cancer Mother    Hyperlipidemia Mother    Hip fracture Mother    Hyperlipidemia Father    Lung cancer Father    Hypertension Father    Cancer Father    Hyperlipidemia Sister    Diabetes Sister    Hypertension Sister    Rheumatic fever Sister    Alcohol abuse Sister    COPD Sister    Breast cancer Other 42   Stroke Maternal Aunt    Diabetes Maternal Uncle    Cancer Niece    Breast cancer Niece     Social History Social History   Tobacco Use   Smoking status: Former     Current packs/day: 0.00    Average packs/day: 1 pack/day for 50.0 years (50.0 ttl pk-yrs)    Types: Cigarettes    Start date: 07/19/1967    Quit date: 07/18/2017    Years since quitting: 5.7   Smokeless tobacco: Never   Tobacco comments:    Patient  states she is already working with PCP to quit  Vaping Use   Vaping status: Never Used  Substance Use Topics   Alcohol use: No   Drug use: No    Allergies  Allergen Reactions   Lisinopril     Raises BP    Current Facility-Administered Medications  Medication Dose Route Frequency Provider Last Rate Last Admin   piperacillin-tazobactam (ZOSYN) IVPB 3.375 g  3.375 g Intravenous Once Jene Every, MD 100 mL/hr at 04/12/23 1845 3.375 g at 04/12/23 1845   promethazine (PHENERGAN) 12.5 mg in sodium chloride 0.9 % 50 mL IVPB  12.5 mg Intravenous Once Jene Every, MD       Current Outpatient Medications  Medication Sig Dispense Refill   albuterol (VENTOLIN HFA) 108 (90 Base) MCG/ACT inhaler Inhale 2 puffs into the lungs every 6 (six) hours as needed for wheezing or shortness of breath. 8 g 2   amLODipine (NORVASC) 2.5 MG tablet Take 2.5 mg by mouth daily.     ANORO ELLIPTA 62.5-25 MCG/ACT AEPB Inhale 1 puff into the lungs daily.     aspirin EC 81 MG tablet Take 81 mg by mouth daily. Swallow whole.     atorvastatin (LIPITOR) 40 MG tablet Take 40 mg by mouth daily.     atorvastatin (LIPITOR) 80 MG tablet Take 80 mg by mouth daily.     calcium carbonate (TUMS EX) 750 MG chewable tablet Chew 1 tablet by mouth daily.     Cholecalciferol (D3 VITAMIN PO) Take by mouth daily.     diphenhydrAMINE (BENADRYL) 50 MG tablet Take 50 mg by mouth at bedtime as needed for itching.     DULoxetine (CYMBALTA) 60 MG capsule Take 60 mg by mouth daily.     estrogens, conjugated, (PREMARIN) 0.3 MG tablet Take 0.3 mg by mouth daily. Take daily for 21 days then do not take for 7 days.     Green Coffee Bean-Yerba Mate (GREEN COFFEE BEAN EXTRACT PO) Take 400 mg by  mouth.     HYDROcodone-acetaminophen (NORCO) 10-325 MG tablet Take 1 tablet by mouth every 6 (six) hours as needed for moderate pain.      losartan (COZAAR) 25 MG tablet Take 25 mg by mouth daily.     losartan (COZAAR) 25 MG tablet Take 1 tablet by mouth daily.     meloxicam (MOBIC) 7.5 MG tablet Take 7.5 mg by mouth 2 (two) times daily.      Multiple Vitamins-Minerals (PA WOMENS 50 PLUS VITAPAK PO) Take by mouth daily.     omeprazole (PRILOSEC) 40 MG capsule Take 40 mg by mouth daily.     omeprazole (PRILOSEC) 40 MG capsule Take by mouth.     oxyCODONE-acetaminophen (PERCOCET) 5-325 MG tablet Take 1 tablet by mouth every 4 (four) hours as needed for severe pain. 30 tablet 0   polyethylene glycol (MIRALAX / GLYCOLAX) packet Take 17 g by mouth daily as needed for moderate constipation.      potassium chloride SA (KLOR-CON M) 20 MEQ tablet potassium chloride ER 20 mEq tablet,extended release(part/cryst)  TAKE 1 TABLET BY MOUTH ONCE DAILY FOR 14 DAYS     pregabalin (LYRICA) 100 MG capsule Take 100 mg by mouth 3 (three) times daily.      pregabalin (LYRICA) 150 MG capsule Take by mouth.     tiZANidine (ZANAFLEX) 4 MG tablet Take 4 mg by mouth 4 (four) times daily as needed for muscle spasms.      tiZANidine (ZANAFLEX) 4 MG tablet  Take by mouth.     traZODone (DESYREL) 50 MG tablet Take 50 mg by mouth at bedtime as needed for sleep.  (Patient not taking: Reported on 10/07/2022)     triamcinolone (NASACORT) 55 MCG/ACT AERO nasal inhaler Place 2 sprays into the nose daily as needed (allergies).     zoledronic acid (RECLAST) 5 MG/100ML SOLN injection Inject 5 mg into the vein once.       Review of Systems Full ROS  was asked and was negative except for the information on the HPI  Physical Exam Blood pressure (!) 165/93, pulse 94, temperature 98.8 F (37.1 C), temperature source Oral, resp. rate 17, height 5' (1.524 m), weight 50.8 kg, SpO2 93%. CONSTITUTIONAL: chronically ill. Debilitated  EYES:  Pupils are equal, round, and reactive to light, Sclera are non-icteric. EARS, NOSE, MOUTH AND THROAT: The oropharynx is clear. The oral mucosa is pink and moist. Hearing is intact to voice. LYMPH NODES:  Lymph nodes in the neck are normal. RESPIRATORY:  Lungs are clear. There is normal respiratory effort, with equal breath sounds bilaterally, and without pathologic use of accessory muscles. CARDIOVASCULAR: Heart is regular without murmurs, gallops, or rubs. GI: The abdomen is distended, decrease BS. Obvious rebound and peritonitis diffusely, no obvious scars or hernias GU: Rectal deferred.   MUSCULOSKELETAL: Normal muscle strength and tone. No cyanosis or edema.   SKIN: Turgor is good and there are no pathologic skin lesions or ulcers. NEUROLOGIC: Motor and sensation is grossly normal. Cranial nerves are grossly intact. PSYCH:  Oriented to person, place and time. Affect is normal.  Data Reviewed  I have personally reviewed the patient's imaging, laboratory findings and medical records.    Assessment/Plan 72 year old female with acute abdomen consistent with bowel obstruction versus ischemic bowel.  She is clearly peritoneal and toxic with acidosis and increase lactate. She will need emergent exploratory laparotomy.  We have started broad-spectrum antibiotics as well as aggressive fluid resuscitation.  Cussed with the patient and the husband in detail about her disease process.  There is definitely increased risk of perioperative morbidity and even mortality.  On the other hand if we do not do anything she will die.  We discussed with him in detail about the possibility of multiple take backs and the possibility of a prolonged hospitalization with ICU care and multiple abdominal washouts.  Currently I am not sure what and what I do and he will depend on operative findings. Extensive counseling provided. I spent greater than 75 minutes in this encounter including personally reviewing imaging  studies, coordinating her care, placing orders and performing documentation  Sterling Big, MD [facility] General Surgeon

## 2023-04-12 NOTE — ED Provider Notes (Signed)
[facility] Provider Note    Event Date/Time   First MD Initiated Contact with Patient 04/12/23 1515     (approximate)   History   Abdominal Pain   HPI  Andrea [name] is a 72 y.o. female with a history of Parkinson's, COPD, hypertension who presents with 2 days of abdominal pain, primarily epigastric pain with nausea and vomiting of dark vomitus.  Nonbloody.  No bowel movement in 2 to 3 days.  Reports shoulder surgery on the left 1 month ago, has had slow recovery, is taking Norco 10 mg but she reports she takes this chronically.      Physical Exam   Triage Vital Signs: ED Triage Vitals  Encounter Vitals Group     BP 04/12/23 1353 (!) 147/95     Systolic BP Percentile --      Diastolic BP Percentile --      Pulse Rate 04/12/23 1353 96     Resp 04/12/23 1353 16     Temp 04/12/23 1353 98.8 F (37.1 C)     Temp Source 04/12/23 1353 Oral     SpO2 04/12/23 1353 98 %     Weight 04/12/23 1354 50.8 kg (112 lb)     Height 04/12/23 1354 1.524 m (5')     Head Circumference --      Peak Flow --      Pain Score 04/12/23 1354 9     Pain Loc --      Pain Education --      Exclude from Growth Chart --     Most recent vital signs: Vitals:   04/12/23 1353 04/12/23 1700  BP: (!) 147/95 (!) 165/93  Pulse: 96 94  Resp: 16 17  Temp: 98.8 F (37.1 C)   SpO2: 98% 93%     General: Awake, uncomfortable CV:  Good peripheral perfusion.  Resp:  Normal effort.  Abd:  Mild distention, tenderness in the upper abdomen Other:     ED Results / Procedures / Treatments   Labs (all labs ordered are listed, but only abnormal results are displayed) Labs Reviewed  COMPREHENSIVE METABOLIC PANEL - Abnormal; Notable for the following components:      Result Value   Sodium 134 (*)    Glucose, Bld 173 (*)    BUN 25 (*)    Calcium 10.6 (*)    Total Bilirubin 1.2 (*)    All other components within normal limits  CBC WITH DIFFERENTIAL/PLATELET - Abnormal; Notable  for the following components:   WBC 17.2 (*)    Neutro Abs 15.2 (*)    Abs Immature Granulocytes 0.08 (*)    All other components within normal limits  LACTIC ACID, PLASMA - Abnormal; Notable for the following components:   Lactic Acid, Venous 3.2 (*)    All other components within normal limits  LIPASE, BLOOD  URINALYSIS, ROUTINE W REFLEX MICROSCOPIC  LACTIC ACID, PLASMA     EKG     RADIOLOGY CT abdomen pelvis viewed to read by me, suspect small bowel obstruction, pending radiology review    PROCEDURES:  Critical Care performed: yes  CRITICAL CARE Performed by: Jene Every   Total critical care time: 30 minutes  Critical care time was exclusive of separately billable procedures and treating other patients.  Critical care was necessary to treat or prevent imminent or life-threatening deterioration.  Critical care was time spent personally by me on the following activities: development of treatment plan with patient and/or  surrogate as well as nursing, discussions with consultants, evaluation of patient's response to treatment, examination of patient, obtaining history from patient or surrogate, ordering and performing treatments and interventions, ordering and review of laboratory studies, ordering and review of radiographic studies, pulse oximetry and re-evaluation of patient's condition.   Procedures   MEDICATIONS ORDERED IN ED: Medications  piperacillin-tazobactam (ZOSYN) IVPB 3.375 g (has no administration in time range)  promethazine (PHENERGAN) 12.5 mg in sodium chloride 0.9 % 50 mL IVPB (has no administration in time range)  iohexol (OMNIPAQUE) 300 MG/ML solution 100 mL (100 mLs Intravenous Contrast Given 04/12/23 1520)  morphine (PF) 4 MG/ML injection 4 mg (4 mg Intravenous Given 04/12/23 1547)  ondansetron (ZOFRAN) injection 4 mg (4 mg Intravenous Given 04/12/23 1546)  sodium chloride 0.9 % bolus 500 mL (0 mLs Intravenous Stopped 04/12/23 1615)  morphine  (PF) 4 MG/ML injection 4 mg (4 mg Intravenous Given 04/12/23 1608)  ondansetron (ZOFRAN) injection 4 mg (4 mg Intravenous Given 04/12/23 1721)  HYDROmorphone (DILAUDID) injection 0.5 mg (0.5 mg Intravenous Given 04/12/23 1803)  0.9 %  sodium chloride infusion ( Intravenous New Bag/Given 04/12/23 1834)     IMPRESSION / MDM / ASSESSMENT AND PLAN / ED COURSE  I reviewed the triage vital signs and the nursing notes. Patient's presentation is most consistent with acute presentation with potential threat to life or bodily function.   Patient presents with abdominal pain as detailed above, she has a white blood cell count of 17.2 and is tender in the epigastrium with mild distention, suspect small bowel obstruction, differential includes diverticulitis, constipation, ileus  Will treat with IV morphine, IV Zofran, obtain CT abdomen pelvis and reevaluate ----------------------------------------- 5:10 PM on 04/12/2023 -----------------------------------------  Contacted by radiology and notified of small bowel obstruction and pneumatosis, possible ischemic bowel.  Have paged Dr. Everlene Farrier of general surgery, will add lactic acid.  Additional nausea medication given, IV Zosyn  ----------------------------------------- 6:26 PM on 04/12/2023 ----------------------------------------- Dr. Everlene Farrier is aware, he is in surgery at this time  ----------------------------------------- 6:36 PM on 04/12/2023 ----------------------------------------- Plan is for patient to go to the OR per Dr. Everlene Farrier     FINAL CLINICAL IMPRESSION(S) / ED DIAGNOSES   Final diagnoses:  Small bowel obstruction (HCC)  Ischemic necrosis of small bowel (HCC)     Rx / DC Orders   ED Discharge Orders     None        Note:  This document was prepared using Dragon voice recognition software and may include unintentional dictation errors.   Jene Every, MD 04/12/23 [phone]

## 2023-04-12 NOTE — Anesthesia Procedure Notes (Signed)
Procedure Name: Intubation Date/Time: 04/12/2023 8:16 PM  Performed by: Mathews Argyle, CRNAPre-anesthesia Checklist: Patient identified, Patient being monitored, Timeout performed, Emergency Drugs available and Suction available Patient Re-evaluated:Patient Re-evaluated prior to induction Oxygen Delivery Method: Circle system utilized Preoxygenation: Pre-oxygenation with 100% oxygen Induction Type: IV induction and Rapid sequence Laryngoscope Size: 3 and McGrath Grade View: Grade I Tube type: Oral Tube size: 7.0 mm Number of attempts: 1 Airway Equipment and Method: Stylet and Video-laryngoscopy Placement Confirmation: ETT inserted through vocal cords under direct vision, positive ETCO2 and breath sounds checked- equal and bilateral Secured at: 21 cm Tube secured with: Tape Dental Injury: Teeth and Oropharynx as per pre-operative assessment

## 2023-04-12 NOTE — Anesthesia Preprocedure Evaluation (Signed)
Anesthesia Evaluation  Patient identified by MRN, date of birth, ID band Patient awake    Reviewed: Allergy & Precautions, NPO status , Patient's Chart, lab work & pertinent test results  History of Anesthesia Complications (+) PONV and history of anesthetic complications  Airway Mallampati: II  TM Distance: >3 FB Neck ROM: Full    Dental  (+) Dental Advidsory Given, Poor Dentition   Pulmonary neg shortness of breath, neg sleep apnea, neg COPD, neg recent URI, former smoker   breath sounds clear to auscultation- rhonchi (-) wheezing      Cardiovascular hypertension, Pt. on medications (-) angina + Peripheral Vascular Disease  (-) CAD, (-) Past MI, (-) Cardiac Stents and (-) CABG + Valvular Problems/Murmurs  Rhythm:Regular Rate:Normal - Systolic murmurs and - Diastolic murmurs    Neuro/Psych neg Seizures PSYCHIATRIC DISORDERS  Depression    negative neurological ROS     GI/Hepatic Neg liver ROS,GERD  ,,  Endo/Other  negative endocrine ROSneg diabetes    Renal/GU negative Renal ROS     Musculoskeletal  (+) Arthritis ,    Abdominal  (+) - obese  Peds  Hematology negative hematology ROS (+)   Anesthesia Other Findings Due to patient's current condition, explained to the patient that she is high risk for complications under anesthesia, including MI, stroke and possibly death. Patient states she is aware and agrees to proceed. Understands possible post op intubation.   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 No date: Cervical post-laminectomy syndrome No date: Chronic constipation No date: Complication of anesthesia     Comment:  itching real bad or msucles spasms No date: Cubital tunnel syndrome No date:  Depression No date: Disorders of sacrum No date: Disturbance of skin sensation No date: GERD (gastroesophageal reflux disease) No date: History of colon polyps No date: Hot flashes No date: Hx of diplopia     Comment:  right eye No date: Hyperlipidemia No date: Hypertension No date: Insomnia No date: Late effects of acute poliomyelitis No date: Lateral epicondylitis  of elbow No date: Loss of memory No date: Menopausal state No date: Osteoporosis No date: Pain in joint of left shoulder 2011: Parkinson's disease (HCC) 2011: Peripheral vascular disease (HCC)     Comment:  venous stasis  No date: Personal history of radiation therapy 2008: Radiation     Comment:  BREAST CA No date: Tremor No date: Unspecified musculoskeletal disorders and symptoms referable  to neck     Comment:  cervical/trapezius No date: Vitamin D deficiency   Reproductive/Obstetrics                             Anesthesia Physical Anesthesia Plan  ASA: 4 and emergent  Anesthesia Plan: General ETT and General   Post-op Pain Management: Minimal or no pain anticipated   Induction: Intravenous  PONV Risk Score and Plan: 4 or greater and Dexamethasone and Ondansetron  Airway Management Planned: Oral ETT  Additional Equipment: Arterial line  Intra-op Plan:   Post-operative Plan: Possible Post-op intubation/ventilation  Informed Consent: I have reviewed the patients History and Physical, chart, labs and discussed the procedure including the risks, benefits  and alternatives for the proposed anesthesia with the patient or authorized representative who has indicated his/her understanding and acceptance.     Dental Advisory Given  Plan Discussed with: Anesthesiologist, CRNA and Surgeon  Anesthesia Plan Comments: (Patient consented for risks of anesthesia including but not limited to:  - adverse reactions to medications - damage to eyes, teeth, lips or other oral mucosa -  nerve damage due to positioning  - sore throat or hoarseness - Damage to heart, brain, nerves, lungs, other parts of body or loss of life  Patient voiced understanding and assent.)        Anesthesia Quick Evaluation

## 2023-04-12 NOTE — ED Provider Triage Note (Signed)
Emergency Medicine Provider Triage Evaluation Note  SHERLIE TRAWINSKI , a 72 y.o. female  was evaluated in triage.  Pt complains of being told she has a bowel obstruction by another doctor. She reports her last good bowel movement was 4-5 days ago, has been passing gas and had pellet like stools today.  Review of Systems  Positive: Vomiting, abdominal pain Negative: fever  Physical Exam  There were no vitals taken for this visit. Gen:   Awake, no distress   Resp:  Normal effort  MSK:   Moves extremities without difficulty  Other:  Patient brought a mason jar of her vomit which looks like muddy water.   Medical Decision Making  Medically screening exam initiated at 1:52 PM.  Appropriate orders placed.  SHEKIA FILAN was informed that the remainder of the evaluation will be completed by another provider, this initial triage assessment does not replace that evaluation, and the importance of remaining in the ED until their evaluation is complete.     Cameron Ali, PA-C 04/12/23 1356

## 2023-04-12 NOTE — Op Note (Signed)
PROCEDURES: Laparotomy reduction of internal hernia Small bowel resection with primary stapled anastomosis  Pre-operative Diagnosis: SBO  Post-operative Diagnosis: same   Surgeon: Merri Ray Adrean Findlay   Assistants: same  Anesthesia: General endotracheal anesthesia  ASA Class: 4   Surgeon: Sterling Big , MD FACS  Anesthesia: Gen. 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 The benefits, complications, treatment options, and expected outcomes were discussed with the patient. The risks of bleeding, infection, recurrence of symptoms, failure to resolve symptoms,  bowel injury, any of which could require further surgery were reviewed with the patient.   The patient was taken to Operating Room, identified as Andrea [name] and the procedure verified.  A Time Out was held and the above information confirmed.  Prior to the induction of general anesthesia, antibiotic prophylaxis was administered. VTE prophylaxis was in place. General endotracheal anesthesia was then administered and tolerated well. After the induction, the abdomen was prepped with Chloraprep and draped in the sterile fashion. The patient was positioned in the supine position.  Generous midline laparotomy incision created and the peritoneum was incised with metz and entered under direct visualization. No injuries observed. Putrid seropurulent fluid seen and cultured.  It was also some serosanguineous ascites.  There was obvious piece of twisted bowel that was being caused by internal hernia.  This was caused by a band from the mesentery.  This been was divided with LigaSure and the internal hernia was detorsed.  The bowel was examined and there was obvious evidence of necrosis.  We waited about 5  minutes to get good final margins of resection.  Proximal and distal windows were created within the mesentery of the small bowel and using standard blue load GIA stapler at the proximal distal bowel was divided.  LigaSure device was used to divide the mesentery.  A side-to-side functional end-to-end anastomosis was created using a standard 75 GIA stapler.  The common channel was also closed with a staple.  The specimen was sent for permanent pathology.  The mesentery defect was closed with a running 3-0 Vicryl. The anastomosis was widely patent did not have any evidence of intraoperative leak and there was good perfusion. Abdominal cavity was irrigated and Exparel was used to perform bilateral T AP blocks under direct visualization. Fascia was closed with running 0 PDS suture using a small bite technique in the standard fashion.  The skin was closed with staples.  Needle and laparotomy count were correct and there were no immediate complications  Sterling Big, MD, FACS

## 2023-04-13 ENCOUNTER — Encounter: Payer: Self-pay | Admitting: Surgery

## 2023-04-13 LAB — BASIC METABOLIC PANEL
Anion gap: 7 (ref 5–15)
BUN: 19 mg/dL (ref 8–23)
CO2: 20 mmol/L — ABNORMAL LOW (ref 22–32)
Calcium: 8.1 mg/dL — ABNORMAL LOW (ref 8.9–10.3)
Chloride: 111 mmol/L (ref 98–111)
Creatinine, Ser: 0.66 mg/dL (ref 0.44–1.00)
GFR, Estimated: >60 mL/min (ref >60)
Glucose, Bld: 127 mg/dL — ABNORMAL HIGH (ref 70–99)
Potassium: 3.4 mmol/L — ABNORMAL LOW (ref 3.5–5.1)
Sodium: 138 mmol/L (ref 135–145)

## 2023-04-13 LAB — CBC
HCT: 25.7 % — ABNORMAL LOW (ref 36.0–46.0)
Hemoglobin: 8.9 g/dL — ABNORMAL LOW (ref 12.0–15.0)
MCH: 30.4 pg (ref 26.0–34.0)
MCHC: 34.6 g/dL (ref 30.0–36.0)
MCV: 87.7 fL (ref 80.0–100.0)
Platelets: 204 10*3/uL (ref 150–400)
RBC: 2.93 MIL/uL — ABNORMAL LOW (ref 3.87–5.11)
RDW: 13.9 % (ref 11.5–15.5)
WBC: 8.6 10*3/uL (ref 4.0–10.5)
nRBC: 0 % (ref 0.0–0.2)

## 2023-04-13 LAB — PHOSPHORUS: Phosphorus: 2.4 mg/dL — ABNORMAL LOW (ref 2.5–4.6)

## 2023-04-13 LAB — MAGNESIUM: Magnesium: 1.7 mg/dL (ref 1.7–2.4)

## 2023-04-13 MED ORDER — POTASSIUM CHLORIDE IN NACL 20-0.9 MEQ/L-% IV SOLN
INTRAVENOUS | Status: DC
  Filled 2023-04-13 (×3): qty 1000

## 2023-04-13 MED ORDER — POTASSIUM CHLORIDE IN NACL 20-0.9 MEQ/L-% IV SOLN
INTRAVENOUS | Status: AC
  Filled 2023-04-13 (×2): qty 1000

## 2023-04-13 MED ORDER — CHLORHEXIDINE GLUCONATE CLOTH 2 % EX PADS
6.0000 | MEDICATED_PAD | Freq: Every day | CUTANEOUS | Status: DC
  Administered 2023-04-13 – 2023-04-16 (×4): 6 via TOPICAL

## 2023-04-13 MED ORDER — PHENOL 1.4 % MT LIQD
1.0000 | OROMUCOSAL | Status: DC | PRN
  Filled 2023-04-13: qty 177

## 2023-04-13 NOTE — Evaluation (Signed)
Physical Therapy Evaluation Patient Details Name: Andrea [name] MRN: [id number] DOB: 04-May-1950 Today's Date: 04/13/2023  History of Present Illness  72 y.o. female with a history of Parkinson's, polio, COPD, hypertension who presents with 2 days of abdominal pain, primarily epigastric pain with nausea and vomiting of dark vomitus.  Nonbloody.  No bowel movement in 2 to 3 days.  L shoulder surgery 1 month ago, has had slow recovery, now with scap fx.  Now s/p small bowel resection and hernia repair 12/10.  Clinical Impression  Pt pleasant and willing to do some basic mobility tasks but was not willing to try getting up today, still feeling weak, recovering from surgery and c/o significant L shoulder pain with any (even very guarded) body movements.  Overall she displayed good awareness of her limitations, she does have her L LE orthotic in room, will need for standing attempts/gait training.  Pt is not at her baseline, will need continued PT to address functional limitations and facilitate a safe discharge plan.        If plan is discharge home, recommend the following: Two people to help with walking and/or transfers;A lot of help with bathing/dressing/bathroom;Assistance with cooking/housework;Assist for transportation;Help with stairs or ramp for entrance   Can travel by private vehicle        Equipment Recommendations  (TBD, pt reports she has most of what she needs)  Recommendations for Other Services       Functional Status Assessment Patient has had a recent decline in their functional status and demonstrates the ability to make significant improvements in function in a reasonable and predictable amount of time.     Precautions / Restrictions Precautions Precautions: Fall Required Braces or Orthoses: Sling (L UE, L LE brace 2/2 polio) Restrictions Weight Bearing Restrictions: Yes LUE Weight Bearing: Non weight bearing Other Position/Activity Restrictions: L sling       Mobility  Bed Mobility Overal bed mobility: Needs Assistance Bed Mobility: Supine to Sit, Sit to Supine     Supine to sit: Mod assist Sit to supine: Max assist   General bed mobility comments: Pt was able to initiate movement toward EOB, used R LE to hook L, overall showed good effort but did not have the strength or pain tolerance to get to sitting w/o significant assist.    Transfers                   General transfer comment: deferred, pt struggled with getting to sitting EOB, did not wish to don brace and try standing today.  Pain limited with L scap fx    Ambulation/Gait                  Stairs            Wheelchair Mobility     Tilt Bed    Modified Rankin (Stroke Patients Only)       Balance Overall balance assessment: Needs assistance Sitting-balance support: Single extremity supported Sitting balance-Leahy Scale: Fair Sitting balance - Comments: Initially leaning heavily to the R, assist with squaring to EOB  - able to maintain sitting upright but with poor posture/tolerance/etc       Standing balance comment: not tested this date                             Pertinent Vitals/Pain Pain Assessment Pain Assessment: No/denies pain    Home Living Family/patient expects to be  discharged to:: Private residence Living Arrangements: Spouse/significant other Available Help at Discharge: Family;Available 24 hours/day   Home Access: Ramped entrance       Home Layout: One level Home Equipment: Agricultural consultant (2 wheels);Cane - quad;BSC/3in1;Shower seat;Wheelchair - manual      Prior Function Prior Level of Function : Independent/Modified Independent             Mobility Comments: Pt has been much less mobile for the last few months, ongoing issues with L shoulder       Extremity/Trunk Assessment   Upper Extremity Assessment Upper Extremity Assessment: Generalized weakness (L UE in sling, R with limited shoulder  elevation, functional strength in limited range)    Lower Extremity Assessment Lower Extremity Assessment: Generalized weakness (R LE grossly 4/5, L LE with polio deformity and little to no AROM)       Communication   Communication Communication: No apparent difficulties  Cognition Arousal: Alert Behavior During Therapy: WFL for tasks assessed/performed Overall Cognitive Status: Within Functional Limits for tasks assessed                                          General Comments      Exercises     Assessment/Plan    PT Assessment Patient needs continued PT services  PT Problem List Decreased strength;Decreased range of motion;Decreased activity tolerance;Decreased balance;Decreased mobility;Decreased knowledge of use of DME;Decreased safety awareness;Pain       PT Treatment Interventions DME instruction;Gait training;Functional mobility training;Therapeutic activities;Therapeutic exercise;Balance training;Neuromuscular re-education;Patient/family education    PT Goals (Current goals can be found in the Care Plan section)  Acute Rehab PT Goals Patient Stated Goal: get back home soon PT Goal Formulation: With patient Time For Goal Achievement: 04/26/23 Potential to Achieve Goals: Fair    Frequency Min 1X/week     Co-evaluation               AM-PAC PT "6 Clicks" Mobility  Outcome Measure Help needed turning from your back to your side while in a flat bed without using bedrails?: A Little Help needed moving from lying on your back to sitting on the side of a flat bed without using bedrails?: A Lot Help needed moving to and from a bed to a chair (including a wheelchair)?: Total Help needed standing up from a chair using your arms (e.g., wheelchair or bedside chair)?: Total Help needed to walk in hospital room?: Total Help needed climbing 3-5 steps with a railing? : Total 6 Click Score: 9    End of Session   Activity Tolerance: Patient  tolerated treatment well;Patient limited by fatigue Patient left: with bed alarm set;with call bell/phone within reach   PT Visit Diagnosis: Muscle weakness (generalized) (M62.81);Difficulty in walking, not elsewhere classified (R26.2);Pain;Unsteadiness on feet (R26.81) Pain - Right/Left: Left Pain - part of body: Shoulder    Time: 4696-2952 PT Time Calculation (min) (ACUTE ONLY): 32 min   Charges:   PT Evaluation $PT Eval Moderate Complexity: 1 Mod PT Treatments $Therapeutic Activity: 8-22 mins PT General Charges $$ ACUTE PT VISIT: 1 Visit         Malachi Pro, DPT 04/13/2023, 5:09 PM

## 2023-04-13 NOTE — Progress Notes (Signed)
OT Cancellation Note  Patient Details Name: Andrea [name] MRN: [id number] DOB: 09-Jul-1950   Cancelled Treatment:    Reason Eval/Treat Not Completed: Pain limiting ability to participate. Consult received, chart reviewed. Messaged RN to coordinate earlier this am for optimal pain control. Upon attempt, pt endorsing 7/10 abdominal pain with coughing, sneezing, and any movement in general. Requesting pain medications. RN notified. Will re-attempt OT evaluation at later date/time as pt is able to tolerate and continue to coordinate with nursing for pain control to optimize participation.   Arman Filter., MPH, MS, OTR/L ascom [phone] 04/13/23, 11:51 AM

## 2023-04-13 NOTE — Progress Notes (Addendum)
[facility] SURGICAL PROGRESS NOTE  Hospital Day(s): 1.   Post op day(s): 1 Day Post-Op.   Interval History:  Patient seen and examined No acute events or new complaints overnight.  Patient very somnolent this AM; arouses briefly to verbal and painful stimuli WBC is normal; 8.6K Hgb to 8.9; likely dilutional  Renal function normal; sCr - 0.66; UO - 100 ccs Mild hypokalemia to 3.4 NGT in place with 250 ccs She is NPO No flatus   Vital signs in last 24 hours: [min-max] current  Temp:  [97.5 F (36.4 C)-99.5 F (37.5 C)] 99.5 F (37.5 C) (12/11 0231) Pulse Rate:  [76-96] 88 (12/11 0231) Resp:  [9-18] 18 (12/11 0231) BP: (125-165)/(60-95) 130/65 (12/11 0231) SpO2:  [93 %-100 %] 96 % (12/11 0231) Weight:  [50.8 kg] 50.8 kg (12/10 1354)     Height: 5' (152.4 cm) Weight: 50.8 kg BMI (Calculated): 21.87   Intake/Output last 2 shifts:  12/10 0701 - 12/11 0700 In: 1750 [I.V.:1600; IV Piggyback:150] Out: 370 [Urine:100; Emesis/NG output:250; Blood:20]   Physical Exam:  Constitutional: sleeping, arouses briefly, NAD HEENT: NGT in place  Respiratory: breathing non-labored at rest  Cardiovascular: regular rate and sinus rhythm  Gastrointestinal: soft, non-distended, incisional tenderness expectedly, no rebound/guarding.  MSK: LUE is in sling  Integumentary: Laparotomy is CDI with staples and honeycomb, no erythema or drainage   Labs:     Latest Ref Rng & Units 04/13/2023    3:03 AM 04/12/2023   10:30 PM 04/12/2023    1:56 PM  CBC  WBC 4.0 - 10.5 K/uL 8.6  9.8  17.2   Hemoglobin 12.0 - 15.0 g/dL 8.9  02.7  25.3   Hematocrit 36.0 - 46.0 % 25.7  30.1  41.7   Platelets 150 - 400 K/uL 204  246  324       Latest Ref Rng & Units 04/13/2023    3:03 AM 04/12/2023   10:30 PM 04/12/2023    1:56 PM  CMP  Glucose 70 - 99 mg/dL 664   403   BUN 8 - 23 mg/dL 19   25   Creatinine 4.74 - 1.00 mg/dL 2.59  5.63  8.75   Sodium 135 - 145 mmol/L 138   134   Potassium 3.5  - 5.1 mmol/L 3.4   3.5   Chloride 98 - 111 mmol/L 111   100   CO2 22 - 32 mmol/L 20   23   Calcium 8.9 - 10.3 mg/dL 8.1   64.3   Total Protein 6.5 - 8.1 g/dL   7.7   Total Bilirubin <1.2 mg/dL   1.2   Alkaline Phos 38 - 126 U/L   95   AST 15 - 41 U/L   26   ALT 0 - 44 U/L   17      Imaging studies: No new pertinent imaging studies   Assessment/Plan:  72 y.o. female 1 Day Post-Op s/p exploratory laparotomy with reduction of internal hernia and small bowel resection   - Would continue NPO for now until better evidence of bowel function  - Continue NGT decompression; LIS; monitor and record output   - Complete perioperative Abx (Cefotetan)  - Continue foley for now; likely DC later today vs tomorrow morning  - Monitor abdominal examination; on-going bowel function  - Pain control prn; antiemetics prn    - Replete K+; monitor   - Mobilize; engage PT   All of the above findings and recommendations were discussed  with the patient, and the medical team, and all of patient's questions were answered to her expressed satisfaction.  -- Lynden Oxford, PA-C [facility] 04/13/2023, 7:19 AM M-F: 7am - 4pm

## 2023-04-13 NOTE — Progress Notes (Signed)
Unable to complete admission profile, pt is drowsy, post surgery.

## 2023-04-14 ENCOUNTER — Inpatient Hospital Stay: Payer: Medicare Other

## 2023-04-14 LAB — CBC
HCT: 20.5 % — ABNORMAL LOW (ref 36.0–46.0)
Hemoglobin: 7 g/dL — ABNORMAL LOW (ref 12.0–15.0)
MCH: 31 pg (ref 26.0–34.0)
MCHC: 34.1 g/dL (ref 30.0–36.0)
MCV: 90.7 fL (ref 80.0–100.0)
Platelets: 175 10*3/uL (ref 150–400)
RBC: 2.26 MIL/uL — ABNORMAL LOW (ref 3.87–5.11)
RDW: 14.7 % (ref 11.5–15.5)
WBC: 9.7 10*3/uL (ref 4.0–10.5)
nRBC: 0 % (ref 0.0–0.2)

## 2023-04-14 LAB — BASIC METABOLIC PANEL
Anion gap: 7 (ref 5–15)
BUN: 16 mg/dL (ref 8–23)
CO2: 21 mmol/L — ABNORMAL LOW (ref 22–32)
Calcium: 8 mg/dL — ABNORMAL LOW (ref 8.9–10.3)
Chloride: 114 mmol/L — ABNORMAL HIGH (ref 98–111)
Creatinine, Ser: 0.42 mg/dL — ABNORMAL LOW (ref 0.44–1.00)
GFR, Estimated: >60 mL/min (ref >60)
Glucose, Bld: 65 mg/dL — ABNORMAL LOW (ref 70–99)
Potassium: 3.4 mmol/L — ABNORMAL LOW (ref 3.5–5.1)
Sodium: 142 mmol/L (ref 135–145)

## 2023-04-14 LAB — PREPARE RBC (CROSSMATCH)

## 2023-04-14 LAB — HEMOGLOBIN AND HEMATOCRIT, BLOOD
HCT: 27.5 % — ABNORMAL LOW (ref 36.0–46.0)
Hemoglobin: 9.5 g/dL — ABNORMAL LOW (ref 12.0–15.0)

## 2023-04-14 MED ORDER — POTASSIUM CHLORIDE 20 MEQ PO PACK
40.0000 meq | PACK | Freq: Once | ORAL | Status: AC
  Administered 2023-04-14: 40 meq via ORAL
  Filled 2023-04-14: qty 2

## 2023-04-14 MED ORDER — FUROSEMIDE 40 MG PO TABS
40.0000 mg | ORAL_TABLET | Freq: Once | ORAL | Status: AC
  Administered 2023-04-14: 40 mg via ORAL
  Filled 2023-04-14: qty 1

## 2023-04-14 MED ORDER — SODIUM CHLORIDE 0.9% IV SOLUTION
Freq: Once | INTRAVENOUS | Status: AC
Start: 2023-04-14 — End: 2023-04-14

## 2023-04-14 NOTE — TOC Initial Note (Signed)
Transition of Care [facility]) - Initial/Assessment Note    Patient Details  Name: Andrea [name] MRN: [id number] Date of Birth: 16-Mar-1951  [facility] of [facility]) CM/SW Contact:    Chapman Fitch, RN Phone Number: 04/14/2023, 11:49 AM  Clinical Narrative:                  Met with patient, husband, and son at bedside  Admitted for: 2 Days Post-Op s/p exploratory laparotomy  Admitted from: home wit husband PCP: Letitia Libra Current home health/prior home health/DME: RW, cane, shower seat, BSC, WC  Therapy recommending SNF.  Patient Declines.  She request home health services through [facility].  Referral made to [facility] with [facility]  Husband states he will transport at discharge   Expected Discharge Plan: Home w Home Health Services Barriers to Discharge: Continued Medical Work up   Patient Goals and CMS Choice   CMS Medicare.gov Compare Post Acute Care list provided to:: Patient Choice offered to / list presented to : Patient      Expected Discharge Plan and Services     Post Acute Care Choice: Home Health Living arrangements for the past 2 months: Single Family Home                           HH Arranged: RN, PT, OT [facility] Agency: [facility] Date [facility] Agency Contacted: 04/14/23   Representative spoke with at [facility]  Prior Living Arrangements/Services Living arrangements for the past 2 months: Single Family Home Lives with:: Spouse              Current home services: DME    Activities of Daily Living      Permission Sought/Granted                  Emotional Assessment              Admission diagnosis:  Small bowel obstruction (HCC) [K56.609] 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] SBO (small bowel obstruction) (HCC) [K56.609] Patient Active Problem List   Diagnosis Date Noted   Small bowel obstruction (HCC) 04/12/2023   SBO (small bowel obstruction) (HCC) 04/12/2023   Primary hypertension 07/28/2021   Insomnia  07/28/2021   History of right mastectomy 06/24/2021   Pneumonia 03/03/2021   Respiratory failure with hypoxia (HCC) 03/03/2021   COPD with acute exacerbation (HCC) 03/03/2021   Wheelchair dependence 10/09/2019   Cervical radiculitis 04/04/2019   Cervical spondylosis with myelopathy and radiculopathy 02/01/2019   B12 deficiency 07/29/2018   Osteoarthritis of hip 09/29/2017   Ataxia 04/15/2017   Loss of memory 04/15/2017   Cardiac murmur 03/04/2017   Hx of adenomatous colonic polyps 12/10/2016   Tremor 08/11/2016   Personal history of tobacco use, presenting hazards to health 06/13/2016   Falls frequently 08/05/2014   Personal history of malignant neoplasm of breast 07/26/2014   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   Lumbosacral spondylosis without myelopathy 04/30/2013   Subacute lumbar radiculopathy 04/30/2013   Chronic low back pain 02/19/2013   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   Sacroiliac joint dysfunction of both sides 07/12/2011   Esophageal reflux 11/02/2010   Pure hypercholesterolemia 11/02/2010   Joint pain 11/02/2010   Mild vitamin D deficiency 11/02/2010   PCP:  Gracelyn Nurse, MD Pharmacy:   [facility] [phone] - [place], [place] - [address] AT [place] [address] [place]  [place] [address] Phone: [phone] Fax: [phone]     Social Drivers of Health (SDOH) Social History: SDOH Screenings   Food Insecurity: No Food Insecurity (04/13/2023)  Housing: Patient Declined (04/13/2023)  Transportation Needs: No Transportation Needs (04/13/2023)  Utilities: Not At Risk (04/13/2023)  Tobacco Use: Medium Risk (04/12/2023)   Received from [facility]   SDOH Interventions:     Readmission Risk Interventions     No data to display

## 2023-04-14 NOTE — Evaluation (Signed)
Occupational Therapy Evaluation Patient Details Name: Andrea [name] MRN: [id number] DOB: 03-25-1951 Today's Date: 04/14/2023   History of Present Illness 72 y.o. female with a history of Parkinson's, polio, COPD, hypertension who presents with 2 days of abdominal pain, primarily epigastric pain with nausea and vomiting of dark vomitus.  Nonbloody.  No bowel movement in 2 to 3 days.  L shoulder surgery 1 month ago, has had slow recovery, now with scap fx.  Now s/p small bowel resection and hernia repair 12/10.   Clinical Impression   Pt was seen for OT evaluation this date. Prior to hospital admission, pt was ind with all ADL's but after her recent scapular fx  s/p repair pt requires help for bathing. Pt lives with husband who is providing A as needed. Pt presents to acute OT demonstrating impaired ADL performance and functional mobility 2/2 (See OT problem list for additional functional deficits). Upon arrival to room pt supine in bed, agreeable to tx. Pt required mod A for all bed mobility. Pt completed grooming tasks (oral care and washing face) sitting at the EOB with supervision. Pt deferred further mobility d/t brace not fitting over catheter. Pt returned to bed. Pt left supine in bed with call bell within reach and all needs met. Pt would benefit from skilled OT services to address noted impairments and functional limitations (see below for any additional details) in order to maximize safety and independence while minimizing falls risk and caregiver burden. Anticipate the need for follow up OT services upon acute hospital DC.        If plan is discharge home, recommend the following: A lot of help with bathing/dressing/bathroom;A little help with walking and/or transfers;Assistance with cooking/housework;Assist for transportation;Help with stairs or ramp for entrance    Functional Status Assessment  Patient has had a recent decline in their functional status and demonstrates the ability to  make significant improvements in function in a reasonable and predictable amount of time.  Equipment Recommendations  Wheelchair (measurements OT)    Recommendations for Other Services       Precautions / Restrictions Precautions Precautions: Fall Required Braces or Orthoses: Sling Restrictions Weight Bearing Restrictions Per Provider Order: Yes LUE Weight Bearing Per Provider Order: Non weight bearing Other Position/Activity Restrictions: L sling      Mobility Bed Mobility Overal bed mobility: Needs Assistance Bed Mobility: Supine to Sit, Sit to Supine     Supine to sit: Mod assist Sit to supine: Mod assist   General bed mobility comments: Pt was able to initiate movement toward EOB, used R LE to hook L, overall showed good effort but did not have the strength or to get to sitting w/o significant assist.    Transfers                   General transfer comment: Pt deferred d/t to brace not fitting over catheter.      Balance Overall balance assessment: Needs assistance Sitting-balance support: Single extremity supported Sitting balance-Leahy Scale: Fair                                     ADL either performed or assessed with clinical judgement   ADL Overall ADL's : Needs assistance/impaired  General ADL Comments: Pt completed grooming tasks (oral care and washing face) at the EOB with supervision.     Vision         Perception         Praxis         Pertinent Vitals/Pain Pain Assessment Pain Assessment: No/denies pain     Extremity/Trunk Assessment Upper Extremity Assessment Upper Extremity Assessment: Generalized weakness   Lower Extremity Assessment Lower Extremity Assessment: Generalized weakness       Communication Communication Communication: No apparent difficulties   Cognition Arousal: Alert Behavior During Therapy: WFL for tasks  assessed/performed Overall Cognitive Status: Within Functional Limits for tasks assessed                                       General Comments       Exercises     Shoulder Instructions      Home Living Family/patient expects to be discharged to:: Private residence Living Arrangements: Spouse/significant other Available Help at Discharge: Family;Available 24 hours/day Type of Home: Mobile home Home Access: Ramped entrance     Home Layout: One level               Home Equipment: Agricultural consultant (2 wheels);Cane - quad;BSC/3in1;Shower seat;Wheelchair - manual          Prior Functioning/Environment Prior Level of Function : Independent/Modified Independent               ADLs Comments: Pt reports IND/MOD I with all ADL's and IADL's prior to her scapular fx s/p repair in 03/2023; pt reports husband has been helping her with bathing.        OT Problem List: Decreased strength;Decreased range of motion;Decreased safety awareness;Decreased knowledge of use of DME or AE;Decreased activity tolerance;Impaired balance (sitting and/or standing)      OT Treatment/Interventions: Therapeutic activities;Self-care/ADL training;Therapeutic exercise;Patient/family education;DME and/or AE instruction    OT Goals(Current goals can be found in the care plan section) Acute Rehab OT Goals Patient Stated Goal: to go home OT Goal Formulation: With patient Time For Goal Achievement: 04/28/23 Potential to Achieve Goals: Fair  OT Frequency: Min 1X/week    Co-evaluation              AM-PAC OT "6 Clicks" Daily Activity     Outcome Measure Help from another person eating meals?: None Help from another person taking care of personal grooming?: A Little Help from another person toileting, which includes using toliet, bedpan, or urinal?: A Lot Help from another person bathing (including washing, rinsing, drying)?: A Lot Help from another person to put on and taking  off regular upper body clothing?: A Little Help from another person to put on and taking off regular lower body clothing?: A Lot 6 Click Score: 16   End of Session    Activity Tolerance: Patient tolerated treatment well Patient left: in bed;with call bell/phone within reach;with bed alarm set  OT Visit Diagnosis: Unsteadiness on feet (R26.81);Other abnormalities of gait and mobility (R26.89);Muscle weakness (generalized) (M62.81)                Time: 6387-5643 OT Time Calculation (min): 29 min Charges:     Butch Penny, SOT

## 2023-04-14 NOTE — Progress Notes (Signed)
Residual check on NG tube at 12:00, no residual noted. 0mL NG tube removed per order. Clear liquid diet ordered per order

## 2023-04-14 NOTE — Plan of Care (Signed)
  Problem: Pain Management: Goal: General experience of comfort will improve Outcome: Progressing   Problem: Safety: Goal: Ability to remain free from injury will improve Outcome: Progressing   Problem: Skin Integrity: Goal: Risk for impaired skin integrity will decrease Outcome: Progressing

## 2023-04-14 NOTE — Progress Notes (Signed)
[facility] SURGICAL PROGRESS NOTE  Hospital Day(s): 2.   Post op day(s): 2 Days Post-Op.   Interval History:  Patient seen and examined No acute events or new complaints overnight.  Patient reports she is doing better Abdominal soreness expectedly Biggest complaint is NGT No fever, chills, nausea, emesis WBC is normal; 9.7K Hgb to 7.0; likely dilutional Renal function normal; sCr - 0.66; UO - 600 ccs Mild hypokalemia to 3.4 NGT in place with 650 ccs - She did endorse eating 5-6 cups of ice yesterday She is NPO She has been having multiple episodes of flatus KUB this morning with some small bowel dilation but air throughout colon.   Vital signs in last 24 hours: [min-max] current  Temp:  [98.1 F (36.7 C)-100 F (37.8 C)] 98.1 F (36.7 C) (12/12 0452) Pulse Rate:  [80-94] 80 (12/12 0452) Resp:  [16-18] 18 (12/12 0452) BP: (133-142)/(60-73) 138/60 (12/12 0452) SpO2:  [96 %-97 %] 97 % (12/12 0452)     Height: 5' (152.4 cm) Weight: 50.8 kg BMI (Calculated): 21.87   Intake/Output last 2 shifts:  12/11 0701 - 12/12 0700 In: 1112.1 [I.V.:812.1; IV Piggyback:300] Out: 1250 [Urine:600; Emesis/NG output:650]   Physical Exam:  Constitutional: sleeping, arouses briefly, NAD HEENT: NGT in place; output dilute from ice chips  Respiratory: breathing non-labored at rest  Cardiovascular: regular rate and sinus rhythm  Gastrointestinal: soft, non-distended, incisional tenderness expectedly, no rebound/guarding.  Genitourinary: Foley in place; good UO MSK: LUE is in sling  Integumentary: Laparotomy is CDI with staples and honeycomb, no erythema or drainage   Labs:     Latest Ref Rng & Units 04/14/2023    4:44 AM 04/13/2023    3:03 AM 04/12/2023   10:30 PM  CBC  WBC 4.0 - 10.5 K/uL 9.7  8.6  9.8   Hemoglobin 12.0 - 15.0 g/dL 7.0  8.9  40.9   Hematocrit 36.0 - 46.0 % 20.5  25.7  30.1   Platelets 150 - 400 K/uL 175  204  246       Latest Ref Rng & Units  04/14/2023    4:44 AM 04/13/2023    3:03 AM 04/12/2023   10:30 PM  CMP  Glucose 70 - 99 mg/dL 65  811    BUN 8 - 23 mg/dL 16  19    Creatinine 9.14 - 1.00 mg/dL 7.82  9.56  2.13   Sodium 135 - 145 mmol/L 142  138    Potassium 3.5 - 5.1 mmol/L 3.4  3.4    Chloride 98 - 111 mmol/L 114  111    CO2 22 - 32 mmol/L 21  20    Calcium 8.9 - 10.3 mg/dL 8.0  8.1       Imaging studies: No new pertinent imaging studies   Assessment/Plan:  72 y.o. female 2 Days Post-Op s/p exploratory laparotomy with reduction of internal hernia and small bowel resection   - Monitor H&H; I did suspect this is dilutional however likely will still benefit from 1 unit pRBCs given Hgb is 7.0  - Will proceed with NGT clamping trial. NG clamped this morning at 0800. Check residuals around 1200. If residuals are <150 ccs, we can remove this.   - Continue NPO for now; limit ice chips - if passing clamping trial, we can do CLD later today   - Discontinue foley today  - Monitor abdominal examination; on-going bowel function  - Pain control prn; antiemetics prn    - Replete K+;  monitor   - Mobilize; PT/OT  All of the above findings and recommendations were discussed with the patient, and the medical team, and all of patient's questions were answered to her expressed satisfaction.  -- Lynden Oxford, PA-C [facility] 04/14/2023, 7:29 AM M-F: 7am - 4pm

## 2023-04-15 LAB — BPAM RBC
Blood Product Expiration Date: 202501052359
ISSUE DATE / TIME: 202412121239
Unit Type and Rh: 6200

## 2023-04-15 LAB — TYPE AND SCREEN
ABO/RH(D): A POS
Antibody Screen: NEGATIVE
Unit division: 0

## 2023-04-15 LAB — BASIC METABOLIC PANEL
Anion gap: 11 (ref 5–15)
BUN: 7 mg/dL — ABNORMAL LOW (ref 8–23)
CO2: 25 mmol/L (ref 22–32)
Calcium: 8.6 mg/dL — ABNORMAL LOW (ref 8.9–10.3)
Chloride: 103 mmol/L (ref 98–111)
Creatinine, Ser: 0.59 mg/dL (ref 0.44–1.00)
GFR, Estimated: >60 mL/min (ref >60)
Glucose, Bld: 134 mg/dL — ABNORMAL HIGH (ref 70–99)
Potassium: 2.4 mmol/L — CL (ref 3.5–5.1)
Sodium: 139 mmol/L (ref 135–145)

## 2023-04-15 LAB — SURGICAL PATHOLOGY

## 2023-04-15 LAB — CBC
HCT: 29.1 % — ABNORMAL LOW (ref 36.0–46.0)
Hemoglobin: 10.4 g/dL — ABNORMAL LOW (ref 12.0–15.0)
MCH: 30.7 pg (ref 26.0–34.0)
MCHC: 35.7 g/dL (ref 30.0–36.0)
MCV: 85.8 fL (ref 80.0–100.0)
Platelets: 210 10*3/uL (ref 150–400)
RBC: 3.39 MIL/uL — ABNORMAL LOW (ref 3.87–5.11)
RDW: 13.9 % (ref 11.5–15.5)
WBC: 11.6 10*3/uL — ABNORMAL HIGH (ref 4.0–10.5)
nRBC: 0 % (ref 0.0–0.2)

## 2023-04-15 MED ORDER — LOSARTAN POTASSIUM 25 MG PO TABS
25.0000 mg | ORAL_TABLET | Freq: Every day | ORAL | Status: DC
  Administered 2023-04-15 – 2023-04-17 (×3): 25 mg via ORAL
  Filled 2023-04-15 (×3): qty 1

## 2023-04-15 MED ORDER — POTASSIUM CHLORIDE 20 MEQ PO PACK
40.0000 meq | PACK | Freq: Two times a day (BID) | ORAL | Status: DC
  Administered 2023-04-15 – 2023-04-17 (×5): 40 meq via ORAL
  Filled 2023-04-15 (×5): qty 2

## 2023-04-15 MED ORDER — FLEET ENEMA RE ENEM: 1.0000 | ENEMA | Freq: Every day | RECTAL | Status: DC | PRN

## 2023-04-15 MED ORDER — POTASSIUM CHLORIDE 10 MEQ/100ML IV SOLN
10.0000 meq | INTRAVENOUS | Status: AC
  Administered 2023-04-15 (×5): 10 meq via INTRAVENOUS
  Filled 2023-04-15 (×5): qty 100

## 2023-04-15 MED ORDER — POTASSIUM CHLORIDE 10 MEQ/100ML IV SOLN
10.0000 meq | INTRAVENOUS | Status: DC
  Administered 2023-04-15: 10 meq via INTRAVENOUS
  Filled 2023-04-15: qty 100

## 2023-04-15 MED ORDER — PREGABALIN 50 MG PO CAPS
100.0000 mg | ORAL_CAPSULE | Freq: Three times a day (TID) | ORAL | Status: DC
  Administered 2023-04-15 – 2023-04-17 (×5): 100 mg via ORAL
  Filled 2023-04-15 (×5): qty 2

## 2023-04-15 MED ORDER — MELATONIN 5 MG PO TABS
5.0000 mg | ORAL_TABLET | Freq: Every evening | ORAL | Status: DC | PRN
  Administered 2023-04-15: 5 mg via ORAL
  Filled 2023-04-15 (×2): qty 1

## 2023-04-15 MED ORDER — ACETAMINOPHEN 160 MG/5ML PO SOLN
650.0000 mg | Freq: Four times a day (QID) | ORAL | Status: DC
  Administered 2023-04-15 – 2023-04-17 (×6): 650 mg via ORAL
  Filled 2023-04-15 (×10): qty 20.3

## 2023-04-15 NOTE — Progress Notes (Signed)
CC: POD # 3 s/p SB resection w anastomosis  Subjective: DOing better Transfused 1 unit RBC w good response Responded well to lasix x 1 K being replaced likely as result of diuresis no clinincal sxs Added BP med + flatus  Objective: Vital signs in last 24 hours: Temp:  [98.1 F (36.7 C)-100.5 F (38.1 C)] 98.6 F (37 C) (12/13 0747) Pulse Rate:  [66-82] 66 (12/13 0747) Resp:  [16-20] 16 (12/13 0747) BP: (136-175)/(57-75) 175/75 (12/13 0747) SpO2:  [96 %-100 %] 97 % (12/13 0747) Last BM Date : 04/12/23  Intake/Output from previous day: 12/12 0701 - 12/13 0700 In: 663.2 [P.O.:250; I.V.:61.2; Blood:352] Out: 2600 [Urine:2600] Intake/Output this shift: No intake/output data recorded.  Physical exam: Chronically ill and malnourished ABd: incision c/d/I, staples in place , no infection or peritonitis   Lab Results: CBC  Recent Labs    04/14/23 0444 04/14/23 2011 04/15/23 0546  WBC 9.7  --  11.6*  HGB 7.0* 9.5* 10.4*  HCT 20.5* 27.5* 29.1*  PLT 175  --  210   BMET Recent Labs    04/14/23 0444 04/15/23 0546  NA 142 139  K 3.4* 2.4*  CL 114* 103  CO2 21* 25  GLUCOSE 65* 134*  BUN 16 7*  CREATININE 0.42* 0.59  CALCIUM 8.0* 8.6*   PT/INR No results for input(s): "LABPROT", "INR" in the last 72 hours. ABG No results for input(s): "PHART", "HCO3" in the last 72 hours.  Invalid input(s): "PCO2", "PO2"  Studies/Results: DG ABD ACUTE 2+V W 1V CHEST Result Date: 04/14/2023 CLINICAL DATA:  Ileus after small-bowel resection for closed loop obstruction. EXAM: DG ABDOMEN ACUTE WITH 1 VIEW CHEST COMPARISON:  CT abdomen pelvis dated April 12, 2023. CT chest dated Sep 24, 2022. Chest x-ray dated March 03, 2021. FINDINGS: Enteric tube tip in the stomach. Mildly dilated loops of small bowel in the central abdomen. Air and stool within non-dilated colon. Small amount of pneumoperitoneum under the hemidiaphragms. No radiopaque calculi or other significant radiographic  abnormality is seen. No acute osseous abnormality. Heart size and mediastinal contours are within normal limits. Mild left basilar atelectasis. No pneumothorax or large pleural effusion. IMPRESSION: 1. Mildly dilated loops of small bowel in the central abdomen, favored to reflect ileus. 2. Small amount of pneumoperitoneum under the hemidiaphragms, presumably postsurgical. 3. Mild left basilar atelectasis. Electronically Signed   By: Obie Dredge M.D.   On: 04/14/2023 11:34    Anti-infectives: Anti-infectives (From admission, onward)    Start     Dose/Rate Route Frequency Ordered Stop   04/12/23 2200  cefoTEtan (CEFOTAN) 2 g in sodium chloride 0.9 % 100 mL IVPB        2 g 200 mL/hr over 30 Minutes Intravenous Every 8 hours 04/12/23 2054 04/14/23 1255   04/12/23 1845  piperacillin-tazobactam (ZOSYN) IVPB 3.375 g        3.375 g 100 mL/hr over 30 Minutes Intravenous  Once 04/12/23 1835 04/12/23 1941       Assessment/Plan:  Doing well Full liquids may advance as tolerated Mobilize Replace k DC probably sunday  Javonne Dorko, MD, [facility]  04/15/2023

## 2023-04-15 NOTE — Progress Notes (Signed)
Notified Dr. Everlene Farrier that pt and husband states that "It's been 7 days since her last bowel movement." Dr. Everlene Farrier aware and assured pt that this is normal due to surgery and if she has constipation in the near future, we can give her a Fleets enema. Pt and husband understand and are in agreement to let bowels move naturally.

## 2023-04-15 NOTE — Care Management Important Message (Signed)
Important Message  Patient Details  Name: CELESTIAL VENDITTO MRN: [id number] Date of Birth: 04-Jul-1950   Important Message Given:  N/A - LOS <3 / Initial given by admissions     Olegario Messier A Edras Wilford 04/15/2023, 10:15 AM

## 2023-04-15 NOTE — Progress Notes (Signed)
   04/15/23 0700  Provider Notification  Provider Name/Title Dr. Sterling Big  Date Provider Notified 04/15/23  Time Provider Notified 0700  Method of Notification Page  Notification Reason Critical Result  Test performed and critical result BMP, K 2.4 mmol/L this am.  Date Critical Result Received 04/15/23  Time Critical Result Received 0700  Provider response Evaluate remotely;See new orders for KCL replacement. Report given to the day shift team.  Date of Provider Response 04/15/23  Time of Provider Response 0710   Filiberto Pinks, RN

## 2023-04-15 NOTE — Progress Notes (Signed)
Pt told me that she did have some heart palpitations "early this morning" but denied any other symptoms. Said she hasn't had anymore palpitations "since then". Potassium IV and PO have been administered as quickly as patient could take them.

## 2023-04-16 LAB — BASIC METABOLIC PANEL
Anion gap: 7 (ref 5–15)
BUN: 10 mg/dL (ref 8–23)
CO2: 25 mmol/L (ref 22–32)
Calcium: 9.2 mg/dL (ref 8.9–10.3)
Chloride: 105 mmol/L (ref 98–111)
Creatinine, Ser: 0.62 mg/dL (ref 0.44–1.00)
GFR, Estimated: >60 mL/min (ref >60)
Glucose, Bld: 91 mg/dL (ref 70–99)
Potassium: 4.3 mmol/L (ref 3.5–5.1)
Sodium: 137 mmol/L (ref 135–145)

## 2023-04-16 LAB — CBC
HCT: 30.2 % — ABNORMAL LOW (ref 36.0–46.0)
Hemoglobin: 10.8 g/dL — ABNORMAL LOW (ref 12.0–15.0)
MCH: 30.5 pg (ref 26.0–34.0)
MCHC: 35.8 g/dL (ref 30.0–36.0)
MCV: 85.3 fL (ref 80.0–100.0)
Platelets: 248 10*3/uL (ref 150–400)
RBC: 3.54 MIL/uL — ABNORMAL LOW (ref 3.87–5.11)
RDW: 14.1 % (ref 11.5–15.5)
WBC: 10.5 10*3/uL (ref 4.0–10.5)
nRBC: 0 % (ref 0.0–0.2)

## 2023-04-16 LAB — MAGNESIUM: Magnesium: 2 mg/dL (ref 1.7–2.4)

## 2023-04-16 NOTE — Progress Notes (Signed)
04/16/2023  Subjective: Patient is 4 Days Post-Op status post exploratory laparotomy and small bowel resection.  No acute events overnight.  White blood cell count today normalized to 10.5.  Potassium much better at 4.3.  Denies any worsening pain.  Passing flatus but no bowel movement yet.  So far tolerating her diet.  Vital signs: Temp:  [97.8 F (36.6 C)-98.8 F (37.1 C)] 98.8 F (37.1 C) (12/14 0751) Pulse Rate:  [72-94] 74 (12/14 0751) Resp:  [12-18] 18 (12/14 0751) BP: (136-159)/(67-96) 156/87 (12/14 0751) SpO2:  [98 %-100 %] 99 % (12/14 0751)   Intake/Output: 12/13 0701 - 12/14 0700 In: 240 [P.O.:240] Out: 700 [Urine:700] Last BM Date : 04/12/23  Physical Exam: Constitutional: No acute distress Abdomen: Soft, nondistended, appropriate sore to palpation.  Midline incision is clean, dry, intact with staples.  Labs:  Recent Labs    04/15/23 0546 04/16/23 0659  WBC 11.6* 10.5  HGB 10.4* 10.8*  HCT 29.1* 30.2*  PLT 210 248   Recent Labs    04/15/23 0546 04/16/23 0659  NA 139 137  K 2.4* 4.3  CL 103 105  CO2 25 25  GLUCOSE 134* 91  BUN 7* 10  CREATININE 0.59 0.62  CALCIUM 8.6* 9.2   No results for input(s): "LABPROT", "INR" in the last 72 hours.  Imaging: No results found.  Assessment/Plan: This is a 72 y.o. female s/p exploratory laparotomy and small bowel resection.  - Patient continues to improve and her white blood cell count has now normalized and her potassium is also normalized.  Her hemoglobin remained stable after 1 unit of blood transfusion 2 days ago.  She is tolerating a diet and having flatus but has not had a bowel movement yet. - Today, continue a soft diet while awaiting for bowel movement.  Will place new order for physical therapy at patient's request to continue with ambulation. - Anticipate being able to discharge her home tomorrow.   Howie Ill, MD [facility]

## 2023-04-17 LAB — AEROBIC/ANAEROBIC CULTURE W GRAM STAIN (SURGICAL/DEEP WOUND): Culture: NO GROWTH

## 2023-04-17 MED ORDER — OXYCODONE HCL 5 MG PO TABS: 5.0000 mg | ORAL_TABLET | ORAL | 0 refills | Status: DC | PRN

## 2023-04-17 NOTE — TOC Transition Note (Signed)
Transition of Care [facility]) - Discharge Note   Patient Details  Name: Andrea [name] MRN: [id number] Date of Birth: [birth year]  Transition of Care [facility]) CM/SW Contact:  Liliana Cline, LCSW Phone Number: 04/17/2023, 8:51 AM   Clinical Narrative:    Patient to DC home today. Notified [facility] with [facility].   Final next level of care: Home w Home Health Services Barriers to Discharge: Barriers Resolved   Patient Goals and CMS Choice   CMS Medicare.gov Compare Post Acute Care list provided to:: Patient Choice offered to / list presented to : Patient      Discharge Placement                       Discharge Plan and Services Additional resources added to the After Visit Summary for       Post Acute Care Choice: Home Health                    HH Arranged: RN, PT, OT [facility] Agency: [facility] Date [facility] Agency Contacted: 04/17/23   Representative spoke with at [facility] Agency: [facility]  Social Drivers of Health (SDOH) Interventions SDOH Screenings   Food Insecurity: No Food Insecurity (04/13/2023)  Housing: Patient Declined (04/13/2023)  Transportation Needs: No Transportation Needs (04/13/2023)  Utilities: Not At Risk (04/13/2023)  Tobacco Use: Medium Risk (04/12/2023)   Received from [facility]     Readmission Risk Interventions     No data to display

## 2023-04-17 NOTE — Discharge Summary (Signed)
Patient ID: Andrea [name] MRN: [id number] DOB/AGE: 1950-08-21 72 y.o.  Admit date: 04/12/2023 Discharge date: 04/17/2023   Discharge Diagnoses:  Principal Problem:   SBO (small bowel obstruction) (HCC) Active Problems:   Small bowel obstruction (HCC)   Procedures:  Exploratory laparotomy, small bowel resection  Hospital Course: Patient was admitted on 04/12/23 and taken to OR emergently for above surgery.  Post-operatively she recovered well.  Her diet was advanced when she regained bowel function, pain was controlled.  PT/OT assisted with patient evaluation and she has been set up with Home Health.  Incision is clean, dry, intact, with staples.  She will follow up with Dr. Everlene Farrier in 1 week.  Consults: PT, OT, CM/SW  Disposition: Discharge disposition: 01-Home or Self Care       Discharge Instructions     Call MD for:  difficulty breathing, headache or visual disturbances   Complete by: As directed    Call MD for:  persistant nausea and vomiting   Complete by: As directed    Call MD for:  redness, tenderness, or signs of infection (pain, swelling, redness, odor or green/yellow discharge around incision site)   Complete by: As directed    Call MD for:  severe uncontrolled pain   Complete by: As directed    Call MD for:  temperature >100.4   Complete by: As directed    Diet general   Complete by: As directed    Maintain a soft diet for 2 more days, then advance to regular home food   Discharge instructions   Complete by: As directed    1.  Patient may shower, but do not scrub wounds heavily and dab dry only. 2.  Do not submerge wounds in pool/tub until fully healed. 3.  Do not remove staples. 4.  May apply ice packs to the wounds for comfort.   Driving Restrictions   Complete by: As directed    Do not drive while taking narcotics for pain control.  Prior to driving, make sure you are able to rotate right and left to look at blindspots without significant pain or  discomfort.   Increase activity slowly   Complete by: As directed    Lifting restrictions   Complete by: As directed    No heavy lifting or pushing of more than 10-15 lbs for 4 weeks.   No dressing needed   Complete by: As directed       Allergies as of 04/17/2023       Reactions   Lisinopril    Raises BP        Medication List     TAKE these medications    albuterol 108 (90 Base) MCG/ACT inhaler Commonly known as: VENTOLIN HFA Inhale 2 puffs into the lungs every 6 (six) hours as needed for wheezing or shortness of breath.   amLODipine 2.5 MG tablet Commonly known as: NORVASC Take 2.5 mg by mouth daily.   Anoro Ellipta 62.5-25 MCG/ACT Aepb Generic drug: umeclidinium-vilanterol Inhale 1 puff into the lungs daily.   aspirin EC 81 MG tablet Take 81 mg by mouth daily. Swallow whole.   atorvastatin 40 MG tablet Commonly known as: LIPITOR Take 40 mg by mouth daily.   D3 VITAMIN PO Take 2,000 Units by mouth daily.   diphenhydrAMINE 50 MG tablet Commonly known as: BENADRYL Take 50 mg by mouth at bedtime as needed for itching.   DULoxetine 60 MG capsule Commonly known as: CYMBALTA Take 60 mg by mouth daily.  HYDROcodone-acetaminophen 10-325 MG tablet Commonly known as: NORCO Take 1 tablet by mouth every 6 (six) hours as needed for moderate pain.   losartan 25 MG tablet Commonly known as: COZAAR Take 1 tablet by mouth daily.   meloxicam 7.5 MG tablet Commonly known as: MOBIC Take 7.5 mg by mouth 2 (two) times daily.   omeprazole 40 MG capsule Commonly known as: PRILOSEC Take 40 mg by mouth daily.   oxyCODONE 5 MG immediate release tablet Commonly known as: Oxy IR/ROXICODONE Take 1 tablet (5 mg total) by mouth every 4 (four) hours as needed for severe pain (pain score 7-10).   polyethylene glycol 17 g packet Commonly known as: MIRALAX / GLYCOLAX Take 17 g by mouth daily as needed for moderate constipation.   pregabalin 150 MG capsule Commonly  known as: LYRICA Take 150 mg by mouth 3 (three) times daily.   tiZANidine 4 MG tablet Commonly known as: ZANAFLEX Take 4 mg by mouth 4 (four) times daily as needed for muscle spasms.   triamcinolone 55 MCG/ACT Aero nasal inhaler Commonly known as: NASACORT Place 2 sprays into the nose daily as needed (allergies).   zoledronic acid 5 MG/100ML Soln injection Commonly known as: RECLAST Inject 5 mg into the vein once.               Discharge Care Instructions  (From admission, onward)           Start     Ordered   04/17/23 0000  No dressing needed        04/17/23 0848            Follow-up Information     P[place], [place] F, MD Follow up in 1 week(s).   Specialty: General Surgery Contact information: [address] [place] [address] [phone]

## 2023-04-17 NOTE — Progress Notes (Signed)
Physical Therapy Treatment Patient Details Name: Andrea [name] MRN: [id number] DOB: [birth year] Today's Date: 04/17/2023   History of Present Illness 72 y.o. female with a history of Parkinson's, polio, COPD, hypertension who presents with 2 days of abdominal pain, primarily epigastric pain with nausea and vomiting of dark vomitus.  Nonbloody.  No bowel movement in 2 to 3 days.  L shoulder surgery 1 month ago, has had slow recovery, now with scap fx.  Now s/p small bowel resection and hernia repair 12/10.    PT Comments  The pt presenting this session with improved functional mobility. She demonstrates ability to complete bed mobility and donning of shoe with standby assistance. The pt requires CGA for mobility due to unsteadiness, but did not have an overt LOB during mobility. At this time the patient is demonstrating improvements to return home with HHPT and family care once medically stable.     If plan is discharge home, recommend the following: A little help with walking and/or transfers;A little help with bathing/dressing/bathroom   Can travel by private vehicle        Equipment Recommendations       Recommendations for Other Services       Precautions / Restrictions Precautions Precautions: Fall Required Braces or Orthoses: Sling;Other Brace Other Brace: L knee brace at baseline Restrictions Weight Bearing Restrictions Per Provider Order: Yes LUE Weight Bearing Per Provider Order: Non weight bearing Other Position/Activity Restrictions: L sling     Mobility  Bed Mobility Overal bed mobility: Needs Assistance Bed Mobility: Supine to Sit, Sit to Supine     Supine to sit: Supervision, HOB elevated Sit to supine: Supervision        Transfers Overall transfer level: Needs assistance Equipment used: Quad cane Transfers: Sit to/from Stand Sit to Stand: Min assist                Ambulation/Gait Ambulation/Gait assistance: Contact guard assist Gait Distance  (Feet): 40 Feet Assistive device: Quad cane Gait Pattern/deviations: Wide base of support, Step-to pattern           Stairs             Wheelchair Mobility     Tilt Bed    Modified Rankin (Stroke Patients Only)       Balance Overall balance assessment: Needs assistance Sitting-balance support: Single extremity supported Sitting balance-Leahy Scale: Good     Standing balance support: Single extremity supported, During functional activity, Reliant on assistive device for balance Standing balance-Leahy Scale: Good                              Cognition Arousal: Alert Behavior During Therapy: WFL for tasks assessed/performed Overall Cognitive Status: Within Functional Limits for tasks assessed                                          Exercises      General Comments        Pertinent Vitals/Pain Pain Assessment Pain Assessment: 0-10 Pain Score: 3  Pain Descriptors / Indicators: Aching Pain Intervention(s): Monitored during session    Home Living Family/patient expects to be discharged to:: Private residence Living Arrangements: Spouse/significant other                      Prior Function  PT Goals (current goals can now be found in the care plan section) Acute Rehab PT Goals Patient Stated Goal: get back home soon PT Goal Formulation: With patient Time For Goal Achievement: 04/26/23 Potential to Achieve Goals: Good Progress towards PT goals: Progressing toward goals    Frequency    Min 1X/week      PT Plan      Co-evaluation              AM-PAC PT "6 Clicks" Mobility   Outcome Measure  Help needed turning from your back to your side while in a flat bed without using bedrails?: A Little Help needed moving from lying on your back to sitting on the side of a flat bed without using bedrails?: A Little Help needed moving to and from a bed to a chair (including a wheelchair)?: A  Little Help needed standing up from a chair using your arms (e.g., wheelchair or bedside chair)?: A Little Help needed to walk in hospital room?: A Little Help needed climbing 3-5 steps with a railing? : A Little 6 Click Score: 18    End of Session   Activity Tolerance: Patient tolerated treatment well Patient left: with call bell/phone within reach Nurse Communication: Mobility status PT Visit Diagnosis: Muscle weakness (generalized) (M62.81);Difficulty in walking, not elsewhere classified (R26.2);Pain;Unsteadiness on feet (R26.81) Pain - Right/Left: Left Pain - part of body: Shoulder     Time: 1610-9604 PT Time Calculation (min) (ACUTE ONLY): 25 min  Charges:    $Gait Training: 23-37 mins PT General Charges $$ ACUTE PT VISIT: 1 Visit                     9:38 AM, 04/17/23 Draiden Mirsky A. Mordecai Maes PT, DPT Physical Therapist - [facility] A Sharine Cadle 04/17/2023, 9:36 AM

## 2023-04-17 NOTE — Plan of Care (Signed)

## 2023-04-17 NOTE — Discharge Instructions (Signed)
Discharge Instructions 1.  Patient may shower, but do not scrub wounds heavily and dab dry only. 2.  Do not submerge wounds in pool/tub until fully healed. 3.  Do not remove staples. 4.  May apply ice packs to the wounds for comfort. 5.  Continue soft diet for 2 days, then advance to home foods 6.  Do not drive while taking narcotics for pain control.  Prior to driving, make sure you are able to rotate right and left to look at blindspots without significant pain or discomfort. 7.  No heavy lifting or pushing of more than 10-15 lbs for 4 weeks.

## 2023-05-02 ENCOUNTER — Ambulatory Visit (INDEPENDENT_AMBULATORY_CARE_PROVIDER_SITE_OTHER): Payer: Medicare Other | Admitting: Surgery

## 2023-05-02 ENCOUNTER — Encounter: Payer: Self-pay | Admitting: Surgery

## 2023-05-02 VITALS — BP 138/80 | HR 73 | Temp 98.0°F | Ht 60.0 in | Wt 114.4 lb

## 2023-05-02 DIAGNOSIS — Z09 Encounter for follow-up examination after completed treatment for conditions other than malignant neoplasm: Secondary | ICD-10-CM

## 2023-05-02 DIAGNOSIS — K56609 Unspecified intestinal obstruction, unspecified as to partial versus complete obstruction: Secondary | ICD-10-CM

## 2023-05-02 NOTE — Patient Instructions (Addendum)
We have removed your staples and have placed steri strips. These will fall off on their own in 1-2 weeks. You may shower, just let the soapy water run over the area, rinse well, and pat dry.   GENERAL POST-OPERATIVE PATIENT INSTRUCTIONS   WOUND CARE INSTRUCTIONS:  Keep a dry clean dressing on the wound if there is drainage. The initial bandage may be removed after 24 hours.  Once the wound has quit draining you may leave it open to air.  If clothing rubs against the wound or causes irritation and the wound is not draining you may cover it with a dry dressing during the daytime.  Try to keep the wound dry and avoid ointments on the wound unless directed to do so.  If the wound becomes bright red and painful or starts to drain infected material that is not clear, please contact your physician immediately.  If the wound is mildly pink and has a thick firm ridge underneath it, this is normal, and is referred to as a healing ridge.  This will resolve over the next 4-6 weeks.  BATHING: You may shower if you have been informed of this by your surgeon. However, Please do not submerge in a tub, hot tub, or pool until incisions are completely sealed or have been told by your surgeon that you may do so.  DIET:  You may eat any foods that you can tolerate.  It is a good idea to eat a high fiber diet and take in plenty of fluids to prevent constipation.  If you do become constipated you may want to take a mild laxative or take ducolax tablets on a daily basis until your bowel habits are regular.  Constipation can be very uncomfortable, along with straining, after recent surgery.  ACTIVITY:  You are encouraged to cough and deep breath or use your incentive spirometer if you were given one, every 15-30 minutes when awake.  This will help prevent respiratory complications and low grade fevers post-operatively if you had a general anesthetic.  You may want to hug a pillow when coughing and sneezing to add additional  support to the surgical area, if you had abdominal or chest surgery, which will decrease pain during these times.  You are encouraged to walk and engage in light activity for the next two weeks.  You should not lift more than 20 pounds for 6 weeks total after surgery as it could put you at increased risk for complications.  Twenty pounds is roughly equivalent to a plastic bag of groceries. At that time- Listen to your body when lifting, if you have pain when lifting, stop and then try again in a few days. Soreness after doing exercises or activities of daily living is normal as you get back in to your normal routine.  MEDICATIONS:  Try to take narcotic medications and anti-inflammatory medications, such as tylenol, ibuprofen, naprosyn, etc., with food.  This will minimize stomach upset from the medication.  Should you develop nausea and vomiting from the pain medication, or develop a rash, please discontinue the medication and contact your physician.  You should not drive, make important decisions, or operate machinery when taking narcotic pain medication.  SUNBLOCK Use sun block to incision area over the next year if this area will be exposed to sun. This helps decrease scarring and will allow you avoid a permanent darkened area over your incision.  QUESTIONS:  Please feel free to call our office if you have any questions, and  we will be glad to assist you. [phone]

## 2023-06-07 NOTE — Progress Notes (Signed)
 Adult Physical Therapy Post-Polio Consult  Patient seen in MD clinic as Post Polio consult. Present with husband  Subjective: I'm still getting around - patient reports ongoing mobility deficit related to her referring diagnosis of post-polio syndrome, for which she walks around the house with a SPC, and uses a collapsible power chair out in the community. Endorses fatigue and L LE weakness, which contributes to reduced household mobility. Reduced participation in ADLs/IADLs largely attributable to L scapular fx and hx of L reverse TSA in the summer of 2024. Feels her shoulder is slowly improving. Denies any recent falls within the past few months, but admits to 3-4 over the past year, largely attributable to decreased LE clearance and strength, and feeling as if my feet get stuck. Utilizes L KAFO, for which she has had for at least 3-4 years, and unfortunately, she has broken this KAFO in various places various times. Feels she may need a new one.    Observation: L gastroc and quadriceps/hamstring atrophy present. L shoe lift present. Significant circumduction required for L LE advancement with shoe lift + KAFO   Strength:  R hip flex               4/5    L hip flex               2+/5 R hip abd              4+/5   L hip abd              2-/5 R knee flexors       4/5    L knee flexors       1/5 R knee extensors  4+/5  L knee extensors  1/5 R ankle DF            4/5    L ankle DF            0/5 R ankle PF             5/5   L ankle PF             0/5  Outcome measures:   0.08 m/s with L KAFO and R SPC (significant fall risk)     102' with 9/10 RPE, SPC, L KAFO locked into extension  Assessment: Andrea [name] is a very pleasant 73 year old female who presented to post polio MD clinic and participated in PT consult. In summary, patient continues to demonstrate L > R LE weakness and ongoing mobility deficits related to her referring diagnosis of PPS. She is largely using power mobility  for community navigation and single point cane for household ambulation. of 0.08 m/s consistent with significant fall risk, and of 102' and 9/10 RPE captures significant decrease in activity tolerance. L KAFO remains necessary for necessary stance control, that is otherwise limited by considerable L LE weakness.  Recommended: - discussed aquatic exercise and benefits for maintaining/progressing aerobic fitness and cardiovascular health, and for balance maintenance, while maintaining below fatigue thresholds that is otherwise difficult to achieve via walking program  Thank you for this consult,  Electronically Signed, Prentice Burns, PT, DPT

## 2023-07-26 ENCOUNTER — Other Ambulatory Visit: Payer: Self-pay | Admitting: Surgery

## 2023-07-26 DIAGNOSIS — Z1231 Encounter for screening mammogram for malignant neoplasm of breast: Secondary | ICD-10-CM

## 2023-08-16 ENCOUNTER — Encounter: Payer: Self-pay | Admitting: Surgery

## 2023-08-16 ENCOUNTER — Other Ambulatory Visit: Payer: Self-pay | Admitting: Surgery

## 2023-08-16 DIAGNOSIS — M810 Age-related osteoporosis without current pathological fracture: Secondary | ICD-10-CM

## 2023-08-16 DIAGNOSIS — Z853 Personal history of malignant neoplasm of breast: Secondary | ICD-10-CM

## 2023-08-19 ENCOUNTER — Encounter: Payer: Self-pay | Admitting: Family Medicine

## 2023-08-23 ENCOUNTER — Encounter: Payer: Self-pay | Admitting: Family Medicine

## 2023-08-24 ENCOUNTER — Other Ambulatory Visit: Payer: Self-pay | Admitting: Family Medicine

## 2023-08-24 DIAGNOSIS — R222 Localized swelling, mass and lump, trunk: Secondary | ICD-10-CM

## 2023-08-25 ENCOUNTER — Ambulatory Visit
Admission: RE | Admit: 2023-08-25 | Discharge: 2023-08-25 | Disposition: A | Discharge status: Home / Self Care | Source: Ambulatory Visit | Attending: Family Medicine | Admitting: Family Medicine

## 2023-08-25 DIAGNOSIS — R222 Localized swelling, mass and lump, trunk: Secondary | ICD-10-CM | POA: Diagnosis present

## 2023-09-22 ENCOUNTER — Other Ambulatory Visit: Payer: Self-pay

## 2023-09-22 DIAGNOSIS — Z122 Encounter for screening for malignant neoplasm of respiratory organs: Secondary | ICD-10-CM

## 2023-09-22 DIAGNOSIS — Z87891 Personal history of nicotine dependence: Secondary | ICD-10-CM

## 2023-09-30 ENCOUNTER — Encounter

## 2023-10-03 ENCOUNTER — Ambulatory Visit: Admission: RE | Admit: 2023-10-03 | Source: Ambulatory Visit

## 2023-10-06 ENCOUNTER — Other Ambulatory Visit

## 2023-10-06 ENCOUNTER — Encounter

## 2023-10-31 ENCOUNTER — Ambulatory Visit
Admission: RE | Admit: 2023-10-31 | Discharge: 2023-10-31 | Disposition: A | Discharge status: Home / Self Care | Source: Ambulatory Visit | Attending: Acute Care | Admitting: Acute Care

## 2023-10-31 DIAGNOSIS — Z122 Encounter for screening for malignant neoplasm of respiratory organs: Secondary | ICD-10-CM | POA: Insufficient documentation

## 2023-10-31 DIAGNOSIS — Z87891 Personal history of nicotine dependence: Secondary | ICD-10-CM | POA: Diagnosis present

## 2023-11-09 ENCOUNTER — Other Ambulatory Visit: Payer: Self-pay | Admitting: Acute Care

## 2023-11-09 DIAGNOSIS — Z87891 Personal history of nicotine dependence: Secondary | ICD-10-CM

## 2023-11-09 DIAGNOSIS — Z122 Encounter for screening for malignant neoplasm of respiratory organs: Secondary | ICD-10-CM

## 2023-11-17 ENCOUNTER — Other Ambulatory Visit

## 2023-11-17 ENCOUNTER — Encounter

## 2023-11-29 NOTE — Progress Notes (Signed)
 Chief Complaint:   Chief Complaint  Patient presents with  . Follow-up    Subjective:   Andrea [name] is a 73 y.o. female in today for follow-up of her chronic medical problems.  Current Outpatient Medications  Medication Sig Dispense Refill  . amLODIPine  (NORVASC ) 2.5 MG tablet Take 1 tablet (2.5 mg total) by mouth once daily 90 tablet 1  . atorvastatin  (LIPITOR ) 40 MG tablet Take 1 tablet (40 mg total) by mouth once daily 90 tablet 3  . calcium  carbonate (TUMS E-X) 300 mg (750 mg) chewable tablet Take 1 tablet (750 mg of salt total) by mouth every 2 (two) hours as needed for Heartburn    . diclofenac  (VOLTAREN ) 1 % topical gel Apply 2 g topically 3 (three) times daily    . DULoxetine (CYMBALTA) 60 MG DR capsule Take 1 capsule (60 mg total) by mouth once daily 90 capsule 1  . estrogen, conjugated,-medroxyPROGESTERone (PREMPRO) 0.3-1.5 mg tablet Take 1 tablet by mouth once daily 30 tablet 2  . ferrous sulfate 325 (65 FE) MG EC tablet Take 1 tablet (325 mg total) by mouth daily with breakfast 90 tablet 3  . HYDROcodone -acetaminophen  (NORCO) 10-325 mg tablet 1 tablet every 6 (six) hours as needed  0  . lidocaine  5 % ointment Apply topically as directed    . losartan  (COZAAR ) 25 MG tablet Take 1 tablet (25 mg total) by mouth once daily 90 tablet 1  . meloxicam  (MOBIC ) 7.5 MG tablet Take 7.5 mg by mouth 2 (two) times daily as needed       . omeprazole (PRILOSEC) 40 MG DR capsule Take 1 capsule (40 mg total) by mouth 2 (two) times daily before meals Take 15-20 mins before meals 180 capsule 3  . polyethylene glycol (MIRALAX ) powder as needed    . pregabalin  (LYRICA ) 150 MG capsule Take 150 mg by mouth 3 (three) times daily    . tiZANidine  (ZANAFLEX ) 4 MG tablet 3 (three) times daily. continuous rx.    . traZODone  (DESYREL ) 50 MG tablet TAKE 1 TO 2 TABLETS BY  MOUTH AT BEDTIME AS NEEDED  FOR SLEEP 180 tablet 3  . VITAMIN D3 ORAL Take 2,000 Units by mouth once daily    . aspirin 325 MG EC tablet  Take 325 mg by mouth once daily (Patient not taking: Reported on 11/29/2023)    . naloxone (NARCAN) 4 mg/actuation nasal spray Place 1 spray (4 mg total) into one nostril once as needed (if not breathing or overdose is suspected.) for up to 1 dose Give 2nd dose in 5-10 min if not responding or if sx return for up to 1 dose. 2 each 1  . zoledronic acid (RECLAST) 5 mg/100 mL injection Inject 5 mg into the vein once yearly (Patient not taking: Reported on 11/29/2023) 100 mL 11   No current facility-administered medications for this visit.    Allergies as of 11/29/2023 - Reviewed 11/29/2023  Allergen Reaction Noted  . Lisinopril Other (See Comments)     Past Medical History:  Diagnosis Date  . Allergic rhinitis due to allergen Years ago  . Allergy Years ago   Seasonal &Lisinopril  . Arthritis   . Breast cancer (CMS/HHS-HCC)    Right breast 2008, 2011.  High-grade DCIS, ER negative.  . Chronic constipation    in my 20's  . Colon polyp 2010   adenomatous; Dr. Jinny   . COPD (chronic obstructive pulmonary disease) (CMS/HHS-HCC) 02/21/2019  . Cubital tunnel syndrome   . Depression   .  Emphysema of lung (CMS/HHS-HCC) 2020  . GERD (gastroesophageal reflux disease)   . Heart murmur 2020  . History of abnormal cervical Pap smear    yrs ago  . History of cataract    couple yrs ago  . History of peripheral vascular disease   . History of pneumonia 02/01/2016  . History of radiation therapy 2008  . Hx of adenomatous colonic polyps 12/10/2016  . Hyperlipidemia   . Hypertension   . Insomnia   . Neck pain   . Osteoporosis   . Osteoporosis of lower leg without pathological fracture 09/23/2020   Left femoral neck, area affected by polio.  . Parkinson disease (CMS/HHS-HCC)   . PONV (postoperative nausea and vomiting)   . Post-polio syndrome (CMS-HCC)   . Sleep apnea   . Tremor     Past Surgical History:  Procedure Laterality Date  . SIGMOIDOSCOPY FLEXIBLE  07/19/2000   Small Polyp, Int  Hemorrhoids  . COLONOSCOPY  09/27/2000   Adenomatous Polyp  . EGD  09/27/2000   No repeat per RTE  . COLONOSCOPY  01/31/2004   PH Adenomatous Polyp: [facility] 01/2009  . COLONOSCOPY  2010  . MASTECTOMY Right 2011  . bursectomy right hip  06/2012  . shoulder surgery Left 08/11/2012   rotator cuff, bone spurs, collar bone  . COLONOSCOPY  10/29/2013   Adenomatous Polyps: [facility] 10/2016; Recall Ltr mailed 09/09/2016 (dw)  . EGD  10/29/2013   No repeat per RTE  . COLONOSCOPY  05/23/2017   Adenomatous Polyp: [facility] 05/2022  . EGD  08/20/2021   Gastritis/Normal EGD biopsies/Repeat 23yrs/SMR  . OTHER SURGERY Left 08/04/2022   Shoulder revision and replacement  . Colon @ [facility]  10/07/2022   Hyperplastic polyps/No repeat due to age/SMR  . abdominal surgery  04/12/2023   small bowel obstruction  . ANTERIOR CERVICAL DISCKECTOMY WITH FUSION AND PLATING C5-C6 IB 6/09    . CARPAL TUNNEL, RIGHT IN 2000, LEFT IN 11-1999    . elbow surgery    . HYSTERECTOMY     precancerous cervical biopsy only  . RECTOCELE/ENTEROCELE REPAIR AND PERINOPLASTY    . RIGHT AND LEFT THUMB ARTHROPLASTY    . RIGHT BREAST BIOPSY WITH LUMPECTOMY    . SHOULDER SURGERYM RIGHT X 3    . TONSILLECTOMY    . TOTAL SHOULDER REPLACEMENT Right   . TUBAL LIGATION  about 1976  . TWO LEFT FOOT SURGERIES;POLIO RELATED       Family History  Problem Relation Name Age of Onset  . Arthritis Mother Dorothyann Cornea   . High blood pressure (Hypertension) Mother Dorothyann Cornea   . Osteoporosis (Thinning of bones) Mother Dorothyann Cornea   . Cancer Mother Dorothyann Cornea        Lung non small cell  . Glaucoma Mother Dorothyann Cornea   . Diabetes Mother Dorothyann Cornea   . Parkinsonism Mother Dorothyann Cornea   . Lung cancer Mother Dorothyann Cornea   . Diabetes type II Mother Dorothyann Cornea   . Hyperlipidemia (Elevated cholesterol) Mother Dorothyann Cornea   . Hip fracture Mother Dorothyann Cornea   . Osteoarthritis Mother Dorothyann Cornea   . High blood pressure  (Hypertension) Father Alva Cornea   . Lung cancer Father Alva Cornea   . Cancer Father Alva Cornea        Lung  . Hyperlipidemia (Elevated cholesterol) Father Alva Cornea   . Rheumatic fever Sister #1   . High blood pressure (Hypertension) Sister #1   . Diabetes Sister #1  borderline  . Alcohol abuse Sister #1   . Hyperlipidemia (Elevated cholesterol) Sister #1   . Rheumatic fever Sister #2   . High blood pressure (Hypertension) Sister #2   . Diabetes Sister #2        borderline  . High blood pressure (Hypertension) Sister Rock Dimes   . COPD Sister Rock Dimes   . Obesity Sister Rock Dimes   . High blood pressure (Hypertension) Sister Brennah Quraishi   . Stroke Maternal Aunt    . Diabetes Maternal Uncle    . Breast cancer Niece         maternal niece  . Colon cancer Neg Hx      Social History:  reports that she quit smoking about 4 years ago. Her smoking use included cigarettes. She started smoking about 6 years ago. She has a 50 pack-year smoking history. She has never used smokeless tobacco. She reports that she does not drink alcohol and does not use drugs.  Results for orders placed or performed in visit on 11/22/23  Basic Metabolic Panel (BMP)  Result Value Ref Range   Glucose 77 70 - 110 mg/dL   Sodium 857 863 - 854 mmol/L   Potassium 4.4 3.6 - 5.1 mmol/L   Chloride 108 97 - 109 mmol/L   Carbon Dioxide (CO2) 31.8 22.0 - 32.0 mmol/L   Calcium  9.4 8.7 - 10.3 mg/dL   Urea Nitrogen (BUN) 11 7 - 25 mg/dL   Creatinine 0.6 0.6 - 1.1 mg/dL   Glomerular Filtration Rate (eGFR) 95 >60 mL/min/1.73sq m   BUN/Crea Ratio 18.3 6.0 - 20.0   Anion Gap w/K 6.6 6.0 - 16.0  CBC w/auto Differential (5 Part)  Result Value Ref Range   WBC (White Blood Cell Count) 4.0 (L) 4.1 - 10.2 10^3/uL   RBC (Red Blood Cell Count) 3.63 (L) 4.04 - 5.48 10^6/uL   Hemoglobin 11.2 (L) 12.0 - 15.0 gm/dL   Hematocrit 66.4 (L) 64.9 - 47.0 %   MCV (Mean Corpuscular Volume) 92.3 80.0 - 100.0 fl   MCH  (Mean Corpuscular Hemoglobin) 30.9 27.0 - 31.2 pg   MCHC (Mean Corpuscular Hemoglobin Concentration) 33.4 32.0 - 36.0 gm/dL   Platelet Count 786 849 - 450 10^3/uL   RDW-CV (Red Cell Distribution Width) 14.1 11.6 - 14.8 %   MPV (Mean Platelet Volume) 11.0 9.4 - 12.4 fl   Neutrophils 1.82 1.50 - 7.80 10^3/uL   Lymphocytes 1.28 1.00 - 3.60 10^3/uL   Monocytes 0.48 0.00 - 1.50 10^3/uL   Eosinophils 0.36 0.00 - 0.55 10^3/uL   Basophils 0.05 0.00 - 0.09 10^3/uL   Neutrophil % 45.4 32.0 - 70.0 %   Lymphocyte % 32.0 10.0 - 50.0 %   Monocyte % 12.0 4.0 - 13.0 %   Eosinophil % 9.0 (H) 1.0 - 5.0 %   Basophil% 1.3 0.0 - 2.0 %   Immature Granulocyte % 0.3 <=0.7 %   Immature Granulocyte Count 0.01 <=0.06 10^3/L  Ferritin  Result Value Ref Range   Ferritin 35 11 - 307 ng/mL      ROS:  General: No fever, chills or recent illness. No change in weight Skin:   No skin lesions, growths, masses, rashes, pruritus  HEENT: No change in vision or hearing. No pain or difficulty with swallowing Respiratory: No cough or shortness of breath CV:  No chest pain or palpitations GI:  No pain, dyspepsia or change in bowel habits GU:  No dysuria, frequency, or hesitancy MSK:  No joint  pain or injury Neurological: No headaches, changes in mental status, loss of sensation or strength Endocrine:  No heat or cold intolerance, polydipsia, polyuria  Objective:   Body mass index is 20.7 kg/m.  BP 120/80   Pulse 60   Ht 152.4 cm (5')   Wt 48.1 kg (106 lb)   SpO2 95%   BMI 20.70 kg/m   General: WD/WN female, in no acute distress HEENT: Pupils equal and round, EOMI. oral mucosa moist.  Oropharynx clear. Neck: supple, trachea midline; no thyromegaly Respiratory:clear to auscultation.  No dullness to percussion.  No use of accessory muscles. Cardiac:  Regular rate and rhythm without murmur, gallops, or rubs    Assessment/Plan:   Chronic obstructive pulmonary disease, unspecified COPD type (CMS/HHS-HCC)   (primary encounter diagnosis) Primary hypertension Observed sleep apnea Pure hypercholesterolemia Age-related osteoporosis without current pathological fracture Post-polio syndrome (CMS-HCC)  Assessment and Plan  1.  COPD.  No recent exacerbation. 2.  Hypertension.  Continue current medication. 3.  Obstructive sleep apnea.  Continue CPAP. 4.  Hyperlipidemia.  Lipid panel is within parameters. 5.  Osteoporosis.  Continue calcium  plus vitamin D . 6.  Postpolio syndrome.  Stable.    Goals     . * Maintain health/healthy lifestyle (pt-stated)    .  Monitor blood pressure at home per provider instruction    .  Monitor blood pressure at home per provider instruction        NORLEEN ALM ROWER, MD  Portions of this note were created using dictation software and may contain typographical errors.  *Some images could not be shown.

## 2023-12-07 NOTE — Progress Notes (Signed)
-------------------------------------------------------------------------------   Attestation with edits by Carolee Lamar Blunt, MD at 12/08/23 1013 I have seen and examined the patient.  I agree with the assessment and plan as written with the exceptions noted.  Robert A. Creighton MD  -------------------------------------------------------------------------------  SPORTS MEDICINE POSTOPERATIVE VISIT  DATE OF PROCEDURE: 09/13/2023  PROCEDURE PERFORMED: Left conversion of failed reverse shoulder to hemiarthroplasty and revision scapula dual plating open reduction and internal fixation   ASSESSMENT    Andrea [name] is a 73 y.o. female s/p above  PLAN:  --We reviewed her imaging today which does not show any evidence of hardware complications. We provided her with a physical therapy referral today. We will plan to see her back in two to three months for re-evaluation. All questions were answered and she is in agreement with plan.   --Imaging findings, further diagnostic and therapeutic options were reviewed with the patient, along with the benefits and drawbacks of each, and the patient expressed understanding  Prescriptions today: Physical therapy  Follow-up: 2.5 months  X-rays at next visit:  None  SUBJECTIVE   History of Present Illness: Andrea [name] is a 73 y.o. female who presents for follow up s/p above procedure. She is doing well overall and is eager to start physical therapy.   Surgical Hx Past Surgical History[1]    OBJECTIVE   Physical Exam:  DETAILED PHYSICAL EXAM General Appearance well-nourished and no acute distress  Vitals Estimated body mass index is 20.51 kg/m as calculated from the following:   Height as of 10/11/23: 152.4 cm (5').   Weight as of 10/11/23: 47.6 kg (105 lb).  Mood and Affect alert, cooperative, and pleasant     Cardiovascular well-perfused distally and no swelling   MUSCULOSKELETAL  LEFT:    SHOULDER:  Incisions healed No  swelling, erythema or deformity FE 80, ER 40, IR to back pocket NVI distally    Imaging/other tests: Radiology studies were ordered and personally reviewed today which reveal left glenohumeral hemiarthroplasty and screw-plate fixation of the scapula and acromion. No acute adverse hardware features.  Orthopaedic PROMIS: Failed to redirect to the Timeline version of the REVFS SmartLink.     * No data to display         Patient IQ: 12/01/2023 Pain Interference: 59.9 Physical Function: 26.7  PRO Status: Patient completed and provider reviewed PRO.         [1] Past Surgical History: Procedure Laterality Date  . ACDF C5-6    . PR OPEN RX SCAPULA FRACTURE Left 09/13/2023   Procedure: OPEN TREATMENT OF SCAPULAR FRACTURE (BODY, GLENOID OR ACROMION) INCLUDES INTERNAL FIXATION, WHEN PERFORMED;  Surgeon: Carolee Lamar Blunt, MD;  [facility]: [facility];  Service: Orthopedics  . PR REMOVAL IMPLANT DEEP Left 09/13/2023   Procedure: REMOVAL OF IMPLANT; DEEP (EG, BURIED WIRE, PIN, SCREW, METAL BAND, NAIL, ROD OR PLATE);  Surgeon: Carolee Lamar Blunt, MD;  Location[facility];  Service: Orthopedics  . PR REVIS SHOULDER ARTHRPLSTY HUMERAL/GLENOID COMPNT Left 09/13/2023   Procedure: REVISION TOTAL SHOULDER ARTHROPLASTY INCL ALLOGRAFT; HUMERAL OR GLENOID COMPONENT;  Surgeon: Carolee Lamar Blunt, MD;  [facility];  Service: Orthopedics  . right breast lumpectomy    . right mastectomy    . right shoulder rotator cuff surgery    . TONSILLECTOMY    *Some images could not be shown.

## 2023-12-15 ENCOUNTER — Encounter: Payer: Self-pay | Admitting: Physical Therapy

## 2023-12-15 ENCOUNTER — Ambulatory Visit: Attending: Orthopedic Surgery | Admitting: Physical Therapy

## 2023-12-15 DIAGNOSIS — M6281 Muscle weakness (generalized): Secondary | ICD-10-CM | POA: Insufficient documentation

## 2023-12-15 DIAGNOSIS — M25512 Pain in left shoulder: Secondary | ICD-10-CM | POA: Insufficient documentation

## 2023-12-15 DIAGNOSIS — M25611 Stiffness of right shoulder, not elsewhere classified: Secondary | ICD-10-CM | POA: Diagnosis present

## 2023-12-15 DIAGNOSIS — Z96612 Presence of left artificial shoulder joint: Secondary | ICD-10-CM | POA: Insufficient documentation

## 2023-12-15 NOTE — Progress Notes (Addendum)
 Expand All Collapse All  OUTPATIENT PHYSICAL THERAPY SHOULDER EVALUATION   Patient Name: VENDA DICE MRN: [id number] DOB:1951-01-17, 73 y.o., female Today's Date: 12/15/2023    PT End of Session - 12/15/23 1602     Visit Number 1    Number of Visits 12    Date for PT Re-Evaluation 01/26/24    PT Start Time 1423    PT Stop Time 1513    PT Time Calculation (min) 50 min    Activity Tolerance Patient tolerated treatment well    Behavior During Therapy [facility] for tasks assessed/performed              Past Medical History:  Diagnosis Date   Allergic rhinitis due to allergen     Allergy     Breast cancer (HCC) 2008    RT LUMPECTOMY   Breast cancer (HCC) 2011    RT MASTECTOMY   Cancer (HCC) 7991,7988      High-grade DCIS,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   Cervical post-laminectomy syndrome     Chronic constipation     Colon polyp 2010   Complication of anesthesia      itching real bad or msucles spasms   COPD (chronic obstructive pulmonary disease) (HCC) 02/21/2019   Cubital tunnel syndrome     Depression     Disorders of sacrum     Disturbance of skin sensation     Emphysema lung (HCC) 2020   GERD (gastroesophageal reflux disease)     Heart murmur     History of abnormal cervical Pap smear     History of cataract     History of colon polyps     History of pneumonia 2008   Hot flashes     Hx of diplopia      right eye   Hyperlipidemia     Hypertension     Insomnia     Late effects of acute poliomyelitis     Lateral epicondylitis  of elbow     Loss of memory     Menopausal state     Neck pain     Osteoporosis     Pain in joint of left shoulder     Parkinson's disease (HCC) 2011   Peripheral vascular disease (HCC) 2011    venous stasis    Personal history of radiation therapy     PONV (postoperative nausea and vomiting)     Post-polio syndrome     Radiation 2008    BREAST CA   Sleep apnea     Tremor     Unspecified musculoskeletal disorders and  symptoms referable to neck      cervical/trapezius   Vitamin D  deficiency               Past Surgical History:  Procedure Laterality Date   ABDOMINAL HYSTERECTOMY   1997   ANTERIOR CERVICAL DECOMP/DISCECTOMY FUSION N/A 02/01/2019    Procedure: Anterior Cervical Decompression Fusion Cervical three-four, Cervical four-five, hardware removal Cervical five-six;  Surgeon: Louis Shove, MD;  Location: [facility] OR;  Service: Neurosurgery;  Laterality: N/A;   BOWEL RESECTION N/A 04/12/2023    Procedure: SMALL BOWEL RESECTION;  Surgeon: Jordis Laneta FALCON, MD;  Location: [facility] ORS;  Service: General;  Laterality: N/A;   BREAST LUMPECTOMY Right 2008    4 cm area of DCIS, margins less than 1 mm. Treated with wide excision, whole breast radiation   BREAST SURGERY Right 12/04/2009    Right simple mastectomy, sentinel node  biopsy.   CARPAL TUNNEL RELEASE       COLONOSCOPY   2010,2015    Dr. Jinny, Dr. Viktoria   COLONOSCOPY       COLONOSCOPY N/A 06/18/2022    Procedure: COLONOSCOPY;  Surgeon: Onita Elspeth Sharper, DO;  Location: [facility] ENDOSCOPY;  Service: Gastroenterology;  Laterality: N/A;   COLONOSCOPY N/A 10/07/2022    Procedure: COLONOSCOPY;  Surgeon: Onita Elspeth Sharper, DO;  Location: [facility] ENDOSCOPY;  Service: Gastroenterology;  Laterality: N/A;   COLONOSCOPY WITH PROPOFOL  N/A 05/23/2017    Procedure: COLONOSCOPY WITH PROPOFOL ;  Surgeon: Viktoria Lamar DASEN, MD;  Location: [facility] ENDOSCOPY;  Service: Endoscopy;  Laterality: N/A;   ELBOW SURGERY Left 2011   ELBOW SURGERY Left 2015   ESOPHAGOGASTRODUODENOSCOPY       ESOPHAGOGASTRODUODENOSCOPY N/A 08/20/2021    Procedure: ESOPHAGOGASTRODUODENOSCOPY (EGD);  Surgeon: Onita Elspeth Sharper, DO;  Location: [facility] ENDOSCOPY;  Service: Gastroenterology;  Laterality: N/A;   FLEXIBLE SIGMOIDOSCOPY   07/19/2000   HIP SURGERY Right 06/19/2012   LAPAROTOMY N/A 04/12/2023    Procedure: EXPLORATORY LAPAROTOMY;  Surgeon: Jordis Laneta FALCON, MD;  Location: [facility] ORS;  Service:  General;  Laterality: N/A;   LEG SURGERY Right 1958    Corrcetive surgery for polio   LEG SURGERY Left 1958    corrective surgery for polio   MASTECTOMY Right 2011    BREAST CA   rectocele/enterocelle repair and perinoplasty       SHOULDER ARTHROSCOPY W/ ROTATOR CUFF REPAIR Bilateral 2010,2012   SHOULDER ARTHROSCOPY WITH OPEN ROTATOR CUFF REPAIR Left 08/19/2015    Procedure: SHOULDER ARTHROSCOPY WITH  MINI OPEN ROTATOR CUFF REPAIR,SUBACROMIAL DECOMPRESSION, ARTHROSCOPIC BICEPS TENODESIS;  Surgeon: Franky Cranker, MD;  Location: [facility] ORS;  Service: Orthopedics;  Laterality: Left;   SHOULDER SURGERY Left 2014   SPINE SURGERY       TOE FUSION Left 1970    little toe fusion   TONSILLECTOMY       TUBAL LIGATION       two left foot surgeries:polio related                Patient Active Problem List    Diagnosis Date Noted   Small bowel obstruction (HCC) 04/12/2023   SBO (small bowel obstruction) (HCC) 04/12/2023   Primary hypertension 07/28/2021   Insomnia 07/28/2021   History of right mastectomy 06/24/2021   Pneumonia 03/03/2021   Respiratory failure with hypoxia (HCC) 03/03/2021   COPD with acute exacerbation (HCC) 03/03/2021   Wheelchair dependence 10/09/2019   Cervical radiculitis 04/04/2019   Cervical spondylosis with myelopathy and radiculopathy 02/01/2019   B12 deficiency 07/29/2018   Osteoarthritis of hip 09/29/2017   Ataxia 04/15/2017   Loss of memory 04/15/2017   Cardiac murmur 03/04/2017   Hx of adenomatous colonic polyps 12/10/2016   Tremor 08/11/2016   Personal history of tobacco use, presenting hazards to health 06/13/2016   Falls frequently 08/05/2014   Personal history of malignant neoplasm of breast 07/26/2014   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   Lumbosacral spondylosis without myelopathy 04/30/2013   Subacute lumbar radiculopathy 04/30/2013   Chronic low back pain 02/19/2013   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 Muscle weakness.    Rationale for Evaluation and Treatment: Rehabilitation   ONSET DATE: Initial surgery was a left shoulder replacement in ~June 2022. Left Reverse Total Shoulder in ~ April 2024. Left Scapular repair in ~ Nov 2024. May 13th, 2025 was surgical date for left conversion of failed reverse total shoulder hemiarthroplasty and revision scapula.    SUBJECTIVE:                                                                                                                                                                                       SUBJECTIVE STATEMENT: Pt. presents to physical therapy with 3/10 left shoulder pain currently s/p left conversion of failed reverse shoulder to hemiarthroplasty and revision scapula dual plating open reduction and internal fixation on 09/13/23. Pt. States that her pain is primarily over the superior and anterior aspect of the left humeral head and also into the left pectoral region. Pt. States that her pain can increase to a 7/10 at it's worst if she is laying on it, reaching, pulling, or pushing objects such as reaching for objects in the kitchen, putting her shirts on, or taking care of her cat. Easing factors include rest, hydrocodone  (10 mg), and Lidocaine  ointment which can alleviate sharp, pulling symptoms to a 0/10 at its best. Pt. Has not received any home PT services since her surgery. Pt. Denies any falls within the past 6 months or any recent changes to her  PMH.  Hand dominance: Right   PERTINENT HISTORY: Left shoulder replacement in ~June 2022 Left Reverse Total Shoulder in ~ April 2024 Left Scapular repair in ~ Nov 2024  May 13th, 2025 was surgical date for left conversion of failed reverse total shoulder hemiarthroplasty and revision scapula.     PAIN:  Are you having pain? Yes: NPRS scale: 3/10 currently, 7/10 at worst, 0/10 at best Pain location: anterior shoulder and into pectoral region, superior aspect of humeral head  Pain description: sharp, pulling Aggravating factors: Reaching, pulling, pushing, putting on clothing Relieving factors: heat, hydrocodone  medication (10 mg)   PRECAUTIONS: Fall   RED FLAGS: None           WEIGHT BEARING RESTRICTIONS: No   FALLS:  Has patient fallen in last 6 months? No   LIVING ENVIRONMENT: Lives with: lives with their spouse Lives in: House/apartment Stairs: No Has following equipment at home: Counselling psychologist, Wheelchair (power), and Ramped entry   OCCUPATION: Retired   PLOF: Independent with  household mobility with device, Independent with community mobility with device, Independent with gait, Independent with transfers, Needs assistance with ADLs, and Needs assistance with homemaking   PATIENT GOALS:To increase muscle mass in L UE, to have the range of motion required to reach her hair and the cabinets in her kitchen, to become independent with cooking and cleaning tasks at home so she does not have to rely on her husband   NEXT MD VISIT: 02/02/24   OBJECTIVE:  Note: Objective measures were completed at Evaluation unless otherwise noted.   PATIENT SURVEYS:  QuickDash: 54.5%    COGNITION: Overall cognitive status: Within functional limits for tasks assessed                                  SENSATION: WFL   POSTURE: Forward head posture, rounded shoulders, excessive thoracic kyphosis    UPPER EXTREMITY ROM:    Active ROM Right eval Left eval  Shoulder flexion 126  deg.  62 deg.  Shoulder extension      Shoulder abduction  110 deg. 59 deg.  Shoulder adduction      Shoulder internal rotation  35 deg.  44 deg.  Shoulder external rotation  55 deg. 55 deg.  Elbow flexion      Elbow extension      Wrist flexion      Wrist extension      Wrist ulnar deviation      Wrist radial deviation      Wrist pronation      Wrist supination      (Blank rows = not tested)  Cervical Left Rotation: 30 deg. Cervical Right Rotation: 35 deg.  Passive Left Shoulder ER: 52 deg. Passive Left Shoulder IR: 55 deg. Passive Left Shoulder Flexion: 100 deg. Passive Left Shoulder Abduction: 95 deg.    UPPER EXTREMITY MMT:   MMT Right eval Left eval  Shoulder flexion 4  3-   Shoulder extension      Shoulder abduction  4- 3-   Shoulder adduction      Shoulder internal rotation 4-  3+  Shoulder external rotation  4-  3-  Middle trapezius      Lower trapezius      Elbow flexion  4+ 3   Elbow extension  4+  3*  Wrist flexion      Wrist extension      Wrist ulnar deviation      Wrist radial deviation      Wrist pronation      Wrist supination      Grip strength (lbs) 45.6# 38.8#   (Blank rows = not tested)    PALPATION:  Tenderness to superior and anterior aspect of humeral head. Significant atrophy noted in left periscapular region and around [facility] joint.  TREATMENT DATE: 12/15/2023   See evaluation/ HEP     PATIENT EDUCATION: Education details: Anatomy involved, diagnosis, prognosis, POC, HEP Person educated: Patient Education method: Explanation, Demonstration, Verbal cues, and Handouts Education comprehension: verbalized understanding and returned demonstration   HOME EXERCISE PROGRAM: Access Code: [email or web address] URL: [email or web address] Date: 12/15/2023 Prepared by: Ozell Sero Exercises - Supine Shoulder  Press AAROM in Abduction with Dowel - 2 x daily - 7 x weekly - 2 sets - 10 reps - Supine Shoulder Flexion Extension AAROM with Dowel - 2 x daily - 7 x weekly - 2 sets - 10 reps - Supine Shoulder External Rotation in 45 Degrees Abduction AAROM with Dowel - 2 x daily - 7 x weekly - 2 sets - 10 reps - Scapular Retraction with Resistance - 1 x daily - 4-5 x weekly - 2 sets - 15 reps - Shoulder External Rotation and Scapular Retraction with Resistance - 1 x daily - 4-5 x weekly - 2 sets - 15 reps - Seated Shoulder Abduction with Self-Anchored Resistance - 1 x daily - 4-5 x weekly - 2 sets - 15 reps    ASSESSMENT:   CLINICAL IMPRESSION: Patient is a pleasant 73 y.o.  female who was seen today for physical therapy evaluation and treatment for left shoulder pain s/p left conversion of failed reverse shoulder to hemiarthroplasty and revision scapula dual plating open reduction and internal fixation on 09/13/23. Pt. Observed to be using quad cane with narrow BOS in her left UE which she uses at home and in the community. Pt. Incision sites demonstrated proper healing at this time. Pt. Presents with deficits in active L shoulder flexion and abduction and strength deficits in her left UE which limits her ability to complete functional tasks such as dressing, cooking, cleaning, or taking care of her cat. Pt. Would benefit from skilled physical therapy intervention 2x a week for 6 weeks in order to address these current limitations.    OBJECTIVE IMPAIRMENTS: Abnormal gait, decreased endurance, decreased ROM, decreased strength, impaired UE functional use, and pain.    ACTIVITY LIMITATIONS: carrying, lifting, sleeping, transfers, bathing, dressing, reach over head, hygiene/grooming, and locomotion level   PARTICIPATION LIMITATIONS: meal prep, cleaning, laundry, and driving   PERSONAL FACTORS: 3+ comorbidities: post-polio syndrome, COPD, PD are also affecting patient's functional outcome.    REHAB POTENTIAL: Good    CLINICAL DECISION MAKING: Evolving/moderate complexity   EVALUATION COMPLEXITY: Moderate     GOALS: Goals reviewed with patient? Yes   SHORT TERM GOALS: Target date: 01/05/24   Pt. will have less than a 5/10 left shoulder pain at its worst so that she is able to put on her shirt in the morning and take care of her cat with greater ease.  Baseline: 7/10 at it's worst Goal status: INITIAL   2.  Pt. To increase active left shoulder external rotation to >50 deg. To improve ability to complete ADL/ dressing.   Baseline: see above Goal status: INITIAL   LONG TERM GOALS: Target date: 01/26/24   Pt. will total a <40% on the QuickDash Disability Symptom score so that she self perceives a significant improvement in her daily household activities such as doing household chores.  Baseline: 54.5% Goal status: INITIAL   2.  Pt. will improve gross left UE strength to at least a 4/5 in order to improve ability to reach for and carry items in her kitchen in order to cook her own meals.  Baseline: Patient currently relies on husband for cooking  meals and cannot raise UEs to reach cabinets.   Goal status: INITIAL   3.  Pt. to improve active left shoulder flexion and abduction to at least 90 degrees to improve ability for her to wash and do her hair.  Baseline:  See above Goal status: INITIAL   PLAN:   PT FREQUENCY: 2x/week   PT DURATION: 6 weeks   PLANNED INTERVENTIONS: 97110-Therapeutic exercises, 97530- Therapeutic activity, [id number]- Neuromuscular re-education, 97535- Self Care, and [id number]- Manual therapy   PLAN FOR NEXT SESSION: Progress AAROM to seated or standing position if tolerable and introduce new periscapular exercises to improve UE strength.    Curtistine Bracket, SPT   Ileene Ozell BROCKS, PT 12/15/2023

## 2023-12-15 NOTE — Progress Notes (Deleted)
 Andrea [name]

## 2023-12-21 ENCOUNTER — Encounter: Payer: Self-pay | Admitting: Physical Therapy

## 2023-12-21 ENCOUNTER — Ambulatory Visit: Admitting: Physical Therapy

## 2023-12-21 DIAGNOSIS — Z96612 Presence of left artificial shoulder joint: Secondary | ICD-10-CM

## 2023-12-21 DIAGNOSIS — M25611 Stiffness of right shoulder, not elsewhere classified: Secondary | ICD-10-CM

## 2023-12-21 DIAGNOSIS — M25512 Pain in left shoulder: Secondary | ICD-10-CM | POA: Diagnosis not present

## 2023-12-21 DIAGNOSIS — M6281 Muscle weakness (generalized): Secondary | ICD-10-CM

## 2023-12-21 NOTE — Therapy (Signed)
 OUTPATIENT PHYSICAL THERAPY SHOULDER TREATMENT   Patient Name: Andrea [name] MRN: [id number] DOB:Nov 14, 1950, 73 y.o., female Today's Date: 12/21/2023    PT End of Session - 12/21/23 1354     Visit Number 2    Number of Visits 12    Date for PT Re-Evaluation 01/26/24    PT Start Time 1351    PT Stop Time 1439    PT Time Calculation (min) 48 min    Activity Tolerance Patient tolerated treatment well    Behavior During Therapy [facility] for tasks assessed/performed                 Past Medical History:  Diagnosis Date   Allergic rhinitis due to allergen     Allergy     Breast cancer (HCC) 2008    RT LUMPECTOMY   Breast cancer (HCC) 2011    RT MASTECTOMY   Cancer (HCC) 7991,7988      High-grade DCIS,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   Cervical post-laminectomy syndrome     Chronic constipation     Colon polyp 2010   Complication of anesthesia      itching real bad or msucles spasms   COPD (chronic obstructive pulmonary disease) (HCC) 02/21/2019   Cubital tunnel syndrome     Depression     Disorders of sacrum     Disturbance of skin sensation     Emphysema lung (HCC) 2020   GERD (gastroesophageal reflux disease)     Heart murmur     History of abnormal cervical Pap smear     History of cataract     History of colon polyps     History of pneumonia 2008   Hot flashes     Hx of diplopia      right eye   Hyperlipidemia     Hypertension     Insomnia     Late effects of acute poliomyelitis     Lateral epicondylitis  of elbow     Loss of memory     Menopausal state     Neck pain     Osteoporosis     Pain in joint of left shoulder     Parkinson's disease (HCC) 2011   Peripheral vascular disease (HCC) 2011    venous stasis    Personal history of radiation therapy     PONV (postoperative nausea and vomiting)     Post-polio syndrome     Radiation 2008    BREAST CA   Sleep apnea     Tremor     Unspecified musculoskeletal disorders and symptoms  referable to neck      cervical/trapezius   Vitamin D  deficiency                    Past Surgical History:  Procedure Laterality Date   ABDOMINAL HYSTERECTOMY   1997   ANTERIOR CERVICAL DECOMP/DISCECTOMY FUSION N/A 02/01/2019    Procedure: Anterior Cervical Decompression Fusion Cervical three-four, Cervical four-five, hardware removal Cervical five-six;  Surgeon: Louis Shove, MD;  Location: [facility] OR;  Service: Neurosurgery;  Laterality: N/A;   BOWEL RESECTION N/A 04/12/2023    Procedure: SMALL BOWEL RESECTION;  Surgeon: Jordis Laneta FALCON, MD;  Location: [facility] ORS;  Service: General;  Laterality: N/A;   BREAST LUMPECTOMY Right 2008    4 cm area of DCIS, margins less than 1 mm. Treated with wide excision, whole breast radiation  BREAST SURGERY Right 12/04/2009    Right simple mastectomy, sentinel node biopsy.   CARPAL TUNNEL RELEASE       COLONOSCOPY   2010,2015    Dr. Jinny, Dr. Viktoria   COLONOSCOPY       COLONOSCOPY N/A 06/18/2022    Procedure: COLONOSCOPY;  Surgeon: Onita Elspeth Sharper, DO;  Location: [facility] ENDOSCOPY;  Service: Gastroenterology;  Laterality: N/A;   COLONOSCOPY N/A 10/07/2022    Procedure: COLONOSCOPY;  Surgeon: Onita Elspeth Sharper, DO;  Location: [facility] ENDOSCOPY;  Service: Gastroenterology;  Laterality: N/A;   COLONOSCOPY WITH PROPOFOL  N/A 05/23/2017    Procedure: COLONOSCOPY WITH PROPOFOL ;  Surgeon: Viktoria Lamar DASEN, MD;  Location: [facility] ENDOSCOPY;  Service: Endoscopy;  Laterality: N/A;   ELBOW SURGERY Left 2011   ELBOW SURGERY Left 2015   ESOPHAGOGASTRODUODENOSCOPY       ESOPHAGOGASTRODUODENOSCOPY N/A 08/20/2021    Procedure: ESOPHAGOGASTRODUODENOSCOPY (EGD);  Surgeon: Onita Elspeth Sharper, DO;  Location: [facility] ENDOSCOPY;  Service: Gastroenterology;  Laterality: N/A;   FLEXIBLE SIGMOIDOSCOPY   07/19/2000   HIP SURGERY Right 06/19/2012   LAPAROTOMY N/A 04/12/2023    Procedure: EXPLORATORY LAPAROTOMY;  Surgeon: Jordis Laneta FALCON, MD;  Location: [facility] ORS;  Service: General;   Laterality: N/A;   LEG SURGERY Right 1958    Corrcetive surgery for polio   LEG SURGERY Left 1958    corrective surgery for polio   MASTECTOMY Right 2011    BREAST CA   rectocele/enterocelle repair and perinoplasty       SHOULDER ARTHROSCOPY W/ ROTATOR CUFF REPAIR Bilateral 2010,2012   SHOULDER ARTHROSCOPY WITH OPEN ROTATOR CUFF REPAIR Left 08/19/2015    Procedure: SHOULDER ARTHROSCOPY WITH  MINI OPEN ROTATOR CUFF REPAIR,SUBACROMIAL DECOMPRESSION, ARTHROSCOPIC BICEPS TENODESIS;  Surgeon: Franky Cranker, MD;  Location: [facility] ORS;  Service: Orthopedics;  Laterality: Left;   SHOULDER SURGERY Left 2014   SPINE SURGERY       TOE FUSION Left 1970    little toe fusion   TONSILLECTOMY       TUBAL LIGATION       two left foot surgeries:polio related                    Patient Active Problem List    Diagnosis Date Noted   Small bowel obstruction (HCC) 04/12/2023   SBO (small bowel obstruction) (HCC) 04/12/2023   Primary hypertension 07/28/2021   Insomnia 07/28/2021   History of right mastectomy 06/24/2021   Pneumonia 03/03/2021   Respiratory failure with hypoxia (HCC) 03/03/2021   COPD with acute exacerbation (HCC) 03/03/2021   Wheelchair dependence 10/09/2019   Cervical radiculitis 04/04/2019   Cervical spondylosis with myelopathy and radiculopathy 02/01/2019   B12 deficiency 07/29/2018   Osteoarthritis of hip 09/29/2017   Ataxia 04/15/2017   Loss of memory 04/15/2017   Cardiac murmur 03/04/2017   Hx of adenomatous colonic polyps 12/10/2016   Tremor 08/11/2016   Personal history of tobacco use, presenting hazards to health 06/13/2016   Falls frequently 08/05/2014   Personal history of malignant neoplasm of breast 07/26/2014   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   Lumbosacral spondylosis without myelopathy 04/30/2013   Subacute lumbar radiculopathy 04/30/2013   Chronic low back pain 02/19/2013   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 Muscle weakness.    Rationale for Evaluation and Treatment: Rehabilitation   ONSET DATE: Initial surgery was a left shoulder replacement in ~June 2022. Left Reverse Total Shoulder in ~ April 2024. Left Scapular repair in ~ Nov 2024. May 13th, 2025 was surgical date for left conversion of failed reverse total shoulder hemiarthroplasty and revision scapula.    SUBJECTIVE:                                                                                                                                                                                       SUBJECTIVE STATEMENT: Pt. presents to physical therapy with 3/10 left shoulder pain currently s/p left conversion of failed reverse shoulder to hemiarthroplasty and revision scapula dual plating open reduction and internal fixation on 09/13/23. Pt. States that her pain is primarily over the superior and anterior aspect of the left humeral head and also into the left pectoral region. Pt. States that her pain can increase to a 7/10 at it's worst if she is laying on it, reaching, pulling, or pushing objects such as reaching for objects in the kitchen, putting her shirts on, or taking care of her cat. Easing factors include rest, hydrocodone  (10 mg), and Lidocaine  ointment which can alleviate sharp, pulling symptoms to a 0/10 at its best. Pt. Has not received any home PT services since her surgery. Pt. Denies any falls within the past 6 months or any recent changes to her PMH.   Hand dominance: Right   PERTINENT HISTORY: Left shoulder replacement in ~June 2022 Left Reverse Total Shoulder in ~ April 2024 Left Scapular repair in ~ Nov 2024  May 13th, 2025 was surgical date for left conversion of failed reverse total shoulder hemiarthroplasty and revision scapula.      PAIN:  Are you having pain? Yes: NPRS scale: 3/10 currently, 7/10 at worst, 0/10 at best Pain location: anterior shoulder and into pectoral region, superior aspect of humeral head  Pain description: sharp, pulling Aggravating factors: Reaching, pulling, pushing, putting on clothing Relieving factors: heat, hydrocodone  medication (10 mg)   PRECAUTIONS: Fall   RED FLAGS: None           WEIGHT BEARING RESTRICTIONS: No   FALLS:  Has patient fallen in last 6 months? No   LIVING ENVIRONMENT: Lives with: lives with their spouse Lives in: House/apartment Stairs: No Has following equipment at home:  Quad cane small base, Wheelchair (power), and Ramped entry   OCCUPATION: Retired   PLOF: Independent with household mobility with device, Independent with community mobility with device, Independent with gait, Independent with transfers, Needs assistance with ADLs, and Needs assistance with homemaking   PATIENT GOALS:To increase muscle mass in L UE, to have the range of motion required to reach her hair and the cabinets in her kitchen, to become independent with cooking and cleaning tasks at home so she does not have to rely on her husband   NEXT MD VISIT: 02/02/24   OBJECTIVE:  Note: Objective measures were completed at Evaluation unless otherwise noted.   PATIENT SURVEYS:  QuickDash: 54.5%    COGNITION: Overall cognitive status: Within functional limits for tasks assessed                                  SENSATION: WFL   POSTURE: Forward head posture, rounded shoulders, excessive thoracic kyphosis    UPPER EXTREMITY ROM:    Active ROM Right eval Left eval  Shoulder flexion 126  deg.  62 deg.  Shoulder extension      Shoulder abduction  110 deg. 59 deg.  Shoulder adduction      Shoulder internal rotation  35 deg.  44 deg.  Shoulder external rotation  55 deg. 55 deg.  Elbow flexion      Elbow extension      Wrist flexion      Wrist extension      Wrist ulnar deviation      Wrist radial deviation      Wrist pronation      Wrist supination      (Blank rows = not tested)   Cervical Left Rotation: 30 deg. Cervical Right Rotation: 35 deg.  Passive Left Shoulder ER: 52 deg. Passive Left Shoulder IR: 55 deg. Passive Left Shoulder Flexion: 100 deg. Passive Left Shoulder Abduction: 95 deg.    UPPER EXTREMITY MMT:   MMT Right eval Left eval  Shoulder flexion 4  3-   Shoulder extension      Shoulder abduction  4- 3-   Shoulder adduction      Shoulder internal rotation 4-  3+  Shoulder external rotation  4-  3-  Middle trapezius      Lower trapezius      Elbow flexion  4+ 3   Elbow extension  4+  3*  Wrist flexion      Wrist extension      Wrist ulnar deviation      Wrist radial deviation      Wrist pronation      Wrist supination      Grip strength (lbs) 45.6# 38.8#   (Blank rows = not tested)     PALPATION:  Tenderness to superior and anterior aspect of humeral head. Significant atrophy noted in left periscapular region and around [facility] joint.  TREATMENT DATE: 12/21/2023   Subjective:   Pt. reports a ~6/10 pain. Pt. Reports taking NSAID medication around noon on this day in order to prevent further increase in pain. Pt. Also reports adherence to HEP but asking for clarification on how to complete AAROM with dowel into abduction and ER.    There.ex.: Supine left shoulder AAROM with dowel (Flexion, abduction, ER), 2x10  Seated left shoulder AAROM with dowel (Flexion, abduction, ER), 2x10  Seated left shoulder  Isometrics (ER and IR), 2x10  Seated Scapular retractions with RTB, 2x15  Seated W's into external rotation with RTB, 2x12  Seated Bicep Curs (3# dumbbell), 3x8  Standing Left Shoulder Ladder, 1x12  Manual tx.: PROM of left shoulder in supine (Flexion, abduction, ER, IR)     PATIENT EDUCATION: Education details: Anatomy involved, diagnosis, prognosis, POC, HEP Person educated: Patient Education method: Explanation, Demonstration, Verbal cues, and Handouts Education comprehension: verbalized understanding and returned demonstration   HOME EXERCISE PROGRAM: Access Code: [email or web address] URL: [email or web address] Date: 12/15/2023 Prepared by: Ozell Sero Exercises - Supine Shoulder Press AAROM in Abduction with Dowel - 2 x daily - 7 x weekly - 2 sets - 10 reps - Supine Shoulder Flexion Extension AAROM with Dowel - 2 x daily - 7 x weekly - 2 sets - 10 reps - Supine Shoulder External Rotation in 45 Degrees Abduction AAROM with Dowel - 2 x daily - 7 x weekly - 2 sets - 10 reps - Scapular Retraction with Resistance - 1 x daily - 4-5 x weekly - 2 sets - 15 reps - Shoulder External Rotation and Scapular Retraction with Resistance - 1 x daily - 4-5 x weekly - 2 sets - 15 reps - Seated Shoulder Abduction with Self-Anchored Resistance - 1 x daily - 4-5 x weekly - 2 sets - 15 reps    ASSESSMENT:   CLINICAL IMPRESSION: Pt. Reported sharp pain over the anterior shoulder region with seated and supine AAROM abduction with dowel despite verbal cueing to shift hand positioning so that shoulder was in a less impinged position. Pt introduced to seated isometric ER and IR and standing shoulder ladder exercises on this day which she tolerated well but did report fatigue over the anterior shoulder region by the end of the  shoulder ladder set. Pt. Will continue to benefit from skilled physical therapy intervention in order to address current limitations.    OBJECTIVE IMPAIRMENTS: Abnormal gait,  decreased endurance, decreased ROM, decreased strength, impaired UE functional use, and pain.    ACTIVITY LIMITATIONS: carrying, lifting, sleeping, transfers, bathing, dressing, reach over head, hygiene/grooming, and locomotion level   PARTICIPATION LIMITATIONS: meal prep, cleaning, laundry, and driving   PERSONAL FACTORS: 3+ comorbidities: post-polio syndrome, COPD, PD are also affecting patient's functional outcome.    REHAB POTENTIAL: Good   CLINICAL DECISION MAKING: Evolving/moderate complexity   EVALUATION COMPLEXITY: Moderate     GOALS: Goals reviewed with patient? Yes   SHORT TERM GOALS: Target date: 01/05/24   Pt. will have less than a 5/10 left shoulder pain at its worst so that she is able to put on her shirt in the morning and take care of her cat with greater ease.  Baseline: 7/10 at it's worst Goal status: INITIAL   2.  Pt. To increase active left shoulder external rotation to >50 deg. To improve ability to complete ADL/ dressing.   Baseline: see above Goal status: INITIAL   LONG TERM GOALS: Target date: 01/26/24   Pt. will total a <40% on the  QuickDash Disability Symptom score so that she self perceives a significant improvement in her daily household activities such as doing household chores.  Baseline: 54.5% Goal status: INITIAL   2.  Pt. will improve gross left UE strength to at least a 4/5 in order to improve ability to reach for and carry items in her kitchen in order to cook her own meals.  Baseline: Patient currently relies on husband for cooking meals and cannot raise UEs to reach cabinets.   Goal status: INITIAL   3.  Pt. to improve active left shoulder flexion and abduction to at least 90 degrees to improve ability for her to wash and do her hair.  Baseline:  See above Goal status: INITIAL   PLAN:   PT FREQUENCY: 2x/week   PT DURATION: 6 weeks   PLANNED INTERVENTIONS: 97110-Therapeutic exercises, 97530- Therapeutic activity, 97112- Neuromuscular  re-education, 97535- Self Care, and [id number]- Manual therapy   PLAN FOR NEXT SESSION: Introduce isometric exercises in supine and seated and add doorway isometrics to HEP.    Curtistine Bracket, SPT   Ileene Ozell BROCKS, PT 12/21/2023

## 2023-12-27 ENCOUNTER — Ambulatory Visit: Admitting: Physical Therapy

## 2023-12-27 ENCOUNTER — Encounter: Payer: Self-pay | Admitting: Physical Therapy

## 2023-12-27 DIAGNOSIS — M25611 Stiffness of right shoulder, not elsewhere classified: Secondary | ICD-10-CM

## 2023-12-27 DIAGNOSIS — M6281 Muscle weakness (generalized): Secondary | ICD-10-CM

## 2023-12-27 DIAGNOSIS — M25512 Pain in left shoulder: Secondary | ICD-10-CM

## 2023-12-27 DIAGNOSIS — Z96612 Presence of left artificial shoulder joint: Secondary | ICD-10-CM

## 2023-12-27 NOTE — Therapy (Signed)
 OUTPATIENT PHYSICAL THERAPY SHOULDER TREATMENT   Patient Name: Andrea [name] MRN: [id number] DOB:Dec 05, 1950, 73 y.o., female Today's Date: 12/27/2023    PT End of Session - 12/27/23 1023     Visit Number 3    Number of Visits 12    Date for PT Re-Evaluation 01/26/24    PT Start Time 1024    PT Stop Time 1111    PT Time Calculation (min) 47 min    Activity Tolerance Patient tolerated treatment well;Patient limited by fatigue;Patient limited by pain    Behavior During Therapy [facility] for tasks assessed/performed               Past Medical History:  Diagnosis Date   Allergic rhinitis due to allergen     Allergy     Breast cancer (HCC) 2008    RT LUMPECTOMY   Breast cancer (HCC) 2011    RT MASTECTOMY   Cancer (HCC) 7991,7988      High-grade DCIS,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   Cervical post-laminectomy syndrome     Chronic constipation     Colon polyp 2010   Complication of anesthesia      itching real bad or msucles spasms   COPD (chronic obstructive pulmonary disease) (HCC) 02/21/2019   Cubital tunnel syndrome     Depression     Disorders of sacrum     Disturbance of skin sensation     Emphysema lung (HCC) 2020   GERD (gastroesophageal reflux disease)     Heart murmur     History of abnormal cervical Pap smear     History of cataract     History of colon polyps     History of pneumonia 2008   Hot flashes     Hx of diplopia      right eye   Hyperlipidemia     Hypertension     Insomnia     Late effects of acute poliomyelitis     Lateral epicondylitis  of elbow     Loss of memory     Menopausal state     Neck pain     Osteoporosis     Pain in joint of left shoulder     Parkinson's disease (HCC) 2011   Peripheral vascular disease (HCC) 2011    venous stasis    Personal history of radiation therapy     PONV (postoperative nausea and vomiting)     Post-polio syndrome     Radiation 2008    BREAST CA   Sleep apnea     Tremor      Unspecified musculoskeletal disorders and symptoms referable to neck      cervical/trapezius   Vitamin D  deficiency                    Past Surgical History:  Procedure Laterality Date   ABDOMINAL HYSTERECTOMY   1997   ANTERIOR CERVICAL DECOMP/DISCECTOMY FUSION N/A 02/01/2019    Procedure: Anterior Cervical Decompression Fusion Cervical three-four, Cervical four-five, hardware removal Cervical five-six;  Surgeon: Louis Shove, MD;  Location: [facility] OR;  Service: Neurosurgery;  Laterality: N/A;   BOWEL RESECTION N/A 04/12/2023    Procedure: SMALL BOWEL RESECTION;  Surgeon: Jordis Laneta FALCON, MD;  Location: [facility] ORS;  Service: General;  Laterality: N/A;   BREAST LUMPECTOMY Right 2008    4 cm area of DCIS, margins less than 1 mm. Treated with wide excision, whole breast  radiation   BREAST SURGERY Right 12/04/2009    Right simple mastectomy, sentinel node biopsy.   CARPAL TUNNEL RELEASE       COLONOSCOPY   2010,2015    Dr. Jinny, Dr. Viktoria   COLONOSCOPY       COLONOSCOPY N/A 06/18/2022    Procedure: COLONOSCOPY;  Surgeon: Onita Elspeth Sharper, DO;  Location: [facility] ENDOSCOPY;  Service: Gastroenterology;  Laterality: N/A;   COLONOSCOPY N/A 10/07/2022    Procedure: COLONOSCOPY;  Surgeon: Onita Elspeth Sharper, DO;  Location: [facility] ENDOSCOPY;  Service: Gastroenterology;  Laterality: N/A;   COLONOSCOPY WITH PROPOFOL  N/A 05/23/2017    Procedure: COLONOSCOPY WITH PROPOFOL ;  Surgeon: Viktoria Lamar DASEN, MD;  Location: [facility];  Service: Endoscopy;  Laterality: N/A;   ELBOW SURGERY Left 2011   ELBOW SURGERY Left 2015   ESOPHAGOGASTRODUODENOSCOPY       ESOPHAGOGASTRODUODENOSCOPY N/A 08/20/2021    Procedure: ESOPHAGOGASTRODUODENOSCOPY (EGD);  Surgeon: Onita Elspeth Sharper, DO;  Location: [facility] ENDOSCOPY;  Service: Gastroenterology;  Laterality: N/A;   FLEXIBLE SIGMOIDOSCOPY   07/19/2000   HIP SURGERY Right 06/19/2012   LAPAROTOMY N/A 04/12/2023    Procedure: EXPLORATORY LAPAROTOMY;  Surgeon: Jordis Laneta FALCON, MD;  Location: [facility] ORS;  Service: General;  Laterality: N/A;   LEG SURGERY Right 1958    Corrcetive surgery for polio   LEG SURGERY Left 1958    corrective surgery for polio   MASTECTOMY Right 2011    BREAST CA   rectocele/enterocelle repair and perinoplasty       SHOULDER ARTHROSCOPY W/ ROTATOR CUFF REPAIR Bilateral 2010,2012   SHOULDER ARTHROSCOPY WITH OPEN ROTATOR CUFF REPAIR Left 08/19/2015    Procedure: SHOULDER ARTHROSCOPY WITH  MINI OPEN ROTATOR CUFF REPAIR,SUBACROMIAL DECOMPRESSION, ARTHROSCOPIC BICEPS TENODESIS;  Surgeon: Franky Cranker, MD;  Location: [facility] ORS;  Service: Orthopedics;  Laterality: Left;   SHOULDER SURGERY Left 2014   SPINE SURGERY       TOE FUSION Left 1970    little toe fusion   TONSILLECTOMY       TUBAL LIGATION       two left foot surgeries:polio related                    Patient Active Problem List    Diagnosis Date Noted   Small bowel obstruction (HCC) 04/12/2023   SBO (small bowel obstruction) (HCC) 04/12/2023   Primary hypertension 07/28/2021   Insomnia 07/28/2021   History of right mastectomy 06/24/2021   Pneumonia 03/03/2021   Respiratory failure with hypoxia (HCC) 03/03/2021   COPD with acute exacerbation (HCC) 03/03/2021   Wheelchair dependence 10/09/2019   Cervical radiculitis 04/04/2019   Cervical spondylosis with myelopathy and radiculopathy 02/01/2019   B12 deficiency 07/29/2018   Osteoarthritis of hip 09/29/2017   Ataxia 04/15/2017   Loss of memory 04/15/2017   Cardiac murmur 03/04/2017   Hx of adenomatous colonic polyps 12/10/2016   Tremor 08/11/2016   Personal history of tobacco use, presenting hazards to health 06/13/2016   Falls frequently 08/05/2014   Personal history of malignant neoplasm of breast 07/26/2014   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   Lumbosacral spondylosis without myelopathy 04/30/2013   Subacute lumbar radiculopathy 04/30/2013   Chronic low back pain 02/19/2013   Post-polio syndrome  04/11/2012   Sacroiliac joint dysfunction of both sides 07/12/2011   Esophageal reflux 11/02/2010   Pure hypercholesterolemia 11/02/2010   Joint pain 11/02/2010   Mild vitamin D  deficiency 11/02/2010      PCP: Rudolpho Norleen BIRCH, MD  REFERRING PROVIDER: Carolee Lamar LABOR, MD   REFERRING DIAG:  S42.122G (ICD-10-CM) - Displaced fracture of acromial process, left shoulder, subsequent encounter for fracture with delayed healing  Z96.612 (ICD-10-CM) - Presence of left artificial shoulder joint      THERAPY DIAG: Left shoulder pain. Muscle weakness.    Rationale for Evaluation and Treatment: Rehabilitation   ONSET DATE: Initial surgery was a left shoulder replacement in ~June 2022. Left Reverse Total Shoulder in ~ April 2024. Left Scapular repair in ~ Nov 2024. May 13th, 2025 was surgical date for left conversion of failed reverse total shoulder hemiarthroplasty and revision scapula.    SUBJECTIVE:                                                                                                                                                                                       SUBJECTIVE STATEMENT: Pt. presents to physical therapy with 3/10 left shoulder pain currently s/p left conversion of failed reverse shoulder to hemiarthroplasty and revision scapula dual plating open reduction and internal fixation on 09/13/23. Pt. States that her pain is primarily over the superior and anterior aspect of the left humeral head and also into the left pectoral region. Pt. States that her pain can increase to a 7/10 at it's worst if she is laying on it, reaching, pulling, or pushing objects such as reaching for objects in the kitchen, putting her shirts on, or taking care of her cat. Easing factors include rest, hydrocodone  (10 mg), and Lidocaine  ointment which can alleviate sharp, pulling symptoms to a 0/10 at its best. Pt. Has not received any home PT services since her surgery. Pt. Denies any falls  within the past 6 months or any recent changes to her PMH.  Hand dominance: Right   PERTINENT HISTORY: Left shoulder replacement in ~June 2022 Left Reverse Total Shoulder in ~ April 2024 Left Scapular repair in ~ Nov 2024  May 13th, 2025 was surgical date for left conversion of failed reverse total shoulder hemiarthroplasty and revision scapula.      PAIN:  Are you having pain? Yes: NPRS scale: 3/10 currently, 7/10 at worst, 0/10 at best Pain location: anterior shoulder and into pectoral region, superior aspect of humeral head  Pain description: sharp, pulling Aggravating factors: Reaching, pulling, pushing, putting on clothing Relieving factors: heat, hydrocodone  medication (10 mg)   PRECAUTIONS: Fall   RED FLAGS: None           WEIGHT BEARING RESTRICTIONS: No   FALLS:  Has patient fallen in last 6 months? No   LIVING ENVIRONMENT: Lives with: lives with their spouse Lives in: House/apartment Stairs: No Has following equipment at  home: Counselling psychologist, Wheelchair (power), and Ramped entry   OCCUPATION: Retired   PLOF: Independent with household mobility with device, Independent with community mobility with device, Independent with gait, Independent with transfers, Needs assistance with ADLs, and Needs assistance with homemaking   PATIENT GOALS:To increase muscle mass in L UE, to have the range of motion required to reach her hair and the cabinets in her kitchen, to become independent with cooking and cleaning tasks at home so she does not have to rely on her husband   NEXT MD VISIT: 02/02/24   OBJECTIVE:  Note: Objective measures were completed at Evaluation unless otherwise noted.   PATIENT SURVEYS:  QuickDash: 54.5%    COGNITION: Overall cognitive status: Within functional limits for tasks assessed                                  SENSATION: WFL   POSTURE: Forward head posture, rounded shoulders, excessive thoracic kyphosis    UPPER EXTREMITY ROM:     Active ROM Right eval Left eval  Shoulder flexion 126 deg.  62 deg.  Shoulder extension      Shoulder abduction  110 deg. 59 deg.  Shoulder adduction      Shoulder internal rotation  35 deg.  44 deg.  Shoulder external rotation  55 deg. 55 deg.  Elbow flexion      Elbow extension      Wrist flexion      Wrist extension      Wrist ulnar deviation      Wrist radial deviation      Wrist pronation      Wrist supination      (Blank rows = not tested)   Cervical Left Rotation: 30 deg. Cervical Right Rotation: 35 deg.  Passive Left Shoulder ER: 52 deg. Passive Left Shoulder IR: 55 deg. Passive Left Shoulder Flexion: 100 deg. Passive Left Shoulder Abduction: 95 deg.    UPPER EXTREMITY MMT:   MMT Right eval Left eval  Shoulder flexion 4  3-   Shoulder extension      Shoulder abduction  4- 3-   Shoulder adduction      Shoulder internal rotation 4-  3+  Shoulder external rotation  4-  3-  Middle trapezius      Lower trapezius      Elbow flexion  4+ 3   Elbow extension  4+  3*  Wrist flexion      Wrist extension      Wrist ulnar deviation      Wrist radial deviation      Wrist pronation      Wrist supination      Grip strength (lbs) 45.6# 38.8#   (Blank rows = not tested)     PALPATION:  Tenderness to superior and anterior aspect of humeral head. Significant atrophy noted in left periscapular region and around [facility] joint.  TREATMENT DATE: 12/27/2023   Subjective:   Pt. reports a ~8/10  left anterior shoulder pain prior to tx. session. Pt. Reports pain levels over the weekend fluctuated but were mostly similarly high. Pt. also states that she took NSAID medication before arriving and is tired today due to not sleeping well last night. Pt. reports only being able to do her HEP once this past weekend due to high pain levels and reports that  AAROM with dowel into abduction has been the hardest to complete due to left shoulder pain.     There.ex.: Supine left shoulder AAROM with therapist assist (Flexion, abduction, scaption), 2x10  Seated left shoulder isometrics against therapist (IR, ER), 2x10  Seated Scapular retractions with RTB, 2x15  Seated Horizontal Abduction with RTB, 2x12 (therapist assist under left elbpw to maintain shoulder flexion)  Standing Left Shoulder Ladder, 1x12  Standing Left Shoulder Isometrics at doorway, 2x8 (ER, IR, abduction)   Manual tx.: PROM of left shoulder in supine (Flexion, abduction, ER, IR)     PATIENT EDUCATION: Education details: Anatomy involved, diagnosis, prognosis, POC, HEP Person educated: Patient Education method: Explanation, Demonstration, Verbal cues, and Handouts Education comprehension: verbalized understanding and returned demonstration   HOME EXERCISE PROGRAM: Access Code: [email or web address] URL: [email or web address] Date: 12/15/2023 Prepared by: Ozell Sero Exercises - Supine Shoulder Press AAROM in Abduction with Dowel - 2 x daily - 7 x weekly - 2 sets - 10 reps - Supine Shoulder Flexion Extension AAROM with Dowel - 2 x daily - 7 x weekly - 2 sets - 10 reps - Supine Shoulder External Rotation in 45 Degrees Abduction AAROM with Dowel - 2 x daily - 7 x weekly - 2 sets - 10 reps - Scapular Retraction with Resistance - 1 x daily - 4-5 x weekly - 2 sets - 15 reps - Shoulder External Rotation and Scapular Retraction with Resistance - 1 x daily - 4-5 x weekly - 2 sets - 15 reps - Seated Shoulder Abduction with Self-Anchored Resistance - 1 x daily - 4-5 x weekly - 2 sets - 15 reps    ASSESSMENT:   CLINICAL IMPRESSION: Pt. presents with increased pain over the anterior left shoulder region on this day with focus on supine PROM of shoulder to tolerable range. Pt. With the most pain during passive shoulder flexion at end range (firm end feel) which is reached between 90-100  degrees. Pt. Was introduced to new activity on this day which was seated horizontal abduction with RTB which she had difficulty with abducting left shoulder and required manual support from therapist under left elbow in order to maintain shoulder flexion. Pt. introduced to shoulder ER, IR, and abduction isometric exercises at doorway to introduce gentle strengthening exercises that she can continue to work on as a part of her HEP. Pt. Tolerated all interventions on this day and did not report an increase in pain levels at end of session. Pt. Will continue to benefit from skilled therapeutic intervention in order to address current deficits and activity limitations.    OBJECTIVE IMPAIRMENTS: Abnormal gait, decreased endurance, decreased ROM, decreased strength, impaired UE functional use, and pain.    ACTIVITY LIMITATIONS: carrying, lifting, sleeping, transfers, bathing, dressing, reach over head, hygiene/grooming, and locomotion level   PARTICIPATION LIMITATIONS: meal prep, cleaning, laundry, and driving   PERSONAL FACTORS: 3+ comorbidities: post-polio syndrome, COPD, PD are also affecting patient's functional outcome.    REHAB POTENTIAL: Good   CLINICAL DECISION MAKING: Evolving/moderate complexity   EVALUATION COMPLEXITY: Moderate  GOALS: Goals reviewed with patient? Yes   SHORT TERM GOALS: Target date: 01/05/24   Pt. will have less than a 5/10 left shoulder pain at its worst so that she is able to put on her shirt in the morning and take care of her cat with greater ease.  Baseline: 7/10 at it's worst Goal status: INITIAL   2.  Pt. To increase active left shoulder external rotation to >50 deg. To improve ability to complete ADL/ dressing.   Baseline: see above Goal status: INITIAL   LONG TERM GOALS: Target date: 01/26/24   Pt. will total a <40% on the QuickDash Disability Symptom score so that she self perceives a significant improvement in her daily household activities such as  doing household chores.  Baseline: 54.5% Goal status: INITIAL   2.  Pt. will improve gross left UE strength to at least a 4/5 in order to improve ability to reach for and carry items in her kitchen in order to cook her own meals.  Baseline: Patient currently relies on husband for cooking meals and cannot raise UEs to reach cabinets.   Goal status: INITIAL   3.  Pt. to improve active left shoulder flexion and abduction to at least 90 degrees to improve ability for her to wash and do her hair.  Baseline:  See above Goal status: INITIAL   PLAN:   PT FREQUENCY: 2x/week   PT DURATION: 6 weeks   PLANNED INTERVENTIONS: 97110-Therapeutic exercises, 97530- Therapeutic activity, 97112- Neuromuscular re-education, 97535- Self Care, and [id number]- Manual therapy   PLAN FOR NEXT SESSION: Introduce gentle supine rhythmic stabilization exercise to promote strength/endurance of left shoulder musculature.    Curtistine Bracket, SPT   Ileene Ozell BROCKS, PT 12/27/2023

## 2023-12-28 ENCOUNTER — Other Ambulatory Visit: Payer: Self-pay | Admitting: Medical Genetics

## 2023-12-30 ENCOUNTER — Ambulatory Visit: Admitting: Physical Therapy

## 2023-12-30 DIAGNOSIS — M25512 Pain in left shoulder: Secondary | ICD-10-CM

## 2023-12-30 DIAGNOSIS — M25611 Stiffness of right shoulder, not elsewhere classified: Secondary | ICD-10-CM

## 2023-12-30 DIAGNOSIS — Z96612 Presence of left artificial shoulder joint: Secondary | ICD-10-CM

## 2023-12-30 DIAGNOSIS — M6281 Muscle weakness (generalized): Secondary | ICD-10-CM

## 2023-12-30 NOTE — Therapy (Signed)
 OUTPATIENT PHYSICAL THERAPY SHOULDER TREATMENT   Patient Name: Andrea [name] MRN: [id number] DOB:July 21, 1950, 73 y.o., female Today's Date: 12/31/2023    PT End of Session - 12/30/23 1229     Visit Number 4    Number of Visits 12    Date for PT Re-Evaluation 01/26/24    PT Start Time 1110    PT Stop Time 1201    PT Time Calculation (min) 51 min    Activity Tolerance Patient tolerated treatment well;Patient limited by pain    Behavior During Therapy [facility] for tasks assessed/performed               Past Medical History:  Diagnosis Date   Allergic rhinitis due to allergen     Allergy     Breast cancer (HCC) 2008    RT LUMPECTOMY   Breast cancer (HCC) 2011    RT MASTECTOMY   Cancer (HCC) 7991,7988      High-grade DCIS,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   Cervical post-laminectomy syndrome     Chronic constipation     Colon polyp 2010   Complication of anesthesia      itching real bad or msucles spasms   COPD (chronic obstructive pulmonary disease) (HCC) 02/21/2019   Cubital tunnel syndrome     Depression     Disorders of sacrum     Disturbance of skin sensation     Emphysema lung (HCC) 2020   GERD (gastroesophageal reflux disease)     Heart murmur     History of abnormal cervical Pap smear     History of cataract     History of colon polyps     History of pneumonia 2008   Hot flashes     Hx of diplopia      right eye   Hyperlipidemia     Hypertension     Insomnia     Late effects of acute poliomyelitis     Lateral epicondylitis  of elbow     Loss of memory     Menopausal state     Neck pain     Osteoporosis     Pain in joint of left shoulder     Parkinson's disease (HCC) 2011   Peripheral vascular disease (HCC) 2011    venous stasis    Personal history of radiation therapy     PONV (postoperative nausea and vomiting)     Post-polio syndrome     Radiation 2008    BREAST CA   Sleep apnea     Tremor     Unspecified musculoskeletal  disorders and symptoms referable to neck      cervical/trapezius   Vitamin D  deficiency                    Past Surgical History:  Procedure Laterality Date   ABDOMINAL HYSTERECTOMY   1997   ANTERIOR CERVICAL DECOMP/DISCECTOMY FUSION N/A 02/01/2019    Procedure: Anterior Cervical Decompression Fusion Cervical three-four, Cervical four-five, hardware removal Cervical five-six;  Surgeon: Louis Shove, MD;  Location: [facility] OR;  Service: Neurosurgery;  Laterality: N/A;   BOWEL RESECTION N/A 04/12/2023    Procedure: SMALL BOWEL RESECTION;  Surgeon: Jordis Laneta FALCON, MD;  Location: [facility] ORS;  Service: General;  Laterality: N/A;   BREAST LUMPECTOMY Right 2008    4 cm area of DCIS, margins less than 1 mm. Treated with wide excision, whole breast radiation  BREAST SURGERY Right 12/04/2009    Right simple mastectomy, sentinel node biopsy.   CARPAL TUNNEL RELEASE       COLONOSCOPY   2010,2015    Dr. Jinny, Dr. Viktoria   COLONOSCOPY       COLONOSCOPY N/A 06/18/2022    Procedure: COLONOSCOPY;  Surgeon: Onita Elspeth Sharper, DO;  Location: [facility] ENDOSCOPY;  Service: Gastroenterology;  Laterality: N/A;   COLONOSCOPY N/A 10/07/2022    Procedure: COLONOSCOPY;  Surgeon: Onita Elspeth Sharper, DO;  Location: [facility] ENDOSCOPY;  Service: Gastroenterology;  Laterality: N/A;   COLONOSCOPY WITH PROPOFOL  N/A 05/23/2017    Procedure: COLONOSCOPY WITH PROPOFOL ;  Surgeon: Viktoria Lamar DASEN, MD;  Location: [facility] ENDOSCOPY;  Service: Endoscopy;  Laterality: N/A;   ELBOW SURGERY Left 2011   ELBOW SURGERY Left 2015   ESOPHAGOGASTRODUODENOSCOPY       ESOPHAGOGASTRODUODENOSCOPY N/A 08/20/2021    Procedure: ESOPHAGOGASTRODUODENOSCOPY (EGD);  Surgeon: Onita Elspeth Sharper, DO;  Location: [facility] ENDOSCOPY;  Service: Gastroenterology;  Laterality: N/A;   FLEXIBLE SIGMOIDOSCOPY   07/19/2000   HIP SURGERY Right 06/19/2012   LAPAROTOMY N/A 04/12/2023    Procedure: EXPLORATORY LAPAROTOMY;  Surgeon: Jordis Laneta FALCON, MD;  Location: [facility]  ORS;  Service: General;  Laterality: N/A;   LEG SURGERY Right 1958    Corrcetive surgery for polio   LEG SURGERY Left 1958    corrective surgery for polio   MASTECTOMY Right 2011    BREAST CA   rectocele/enterocelle repair and perinoplasty       SHOULDER ARTHROSCOPY W/ ROTATOR CUFF REPAIR Bilateral 2010,2012   SHOULDER ARTHROSCOPY WITH OPEN ROTATOR CUFF REPAIR Left 08/19/2015    Procedure: SHOULDER ARTHROSCOPY WITH  MINI OPEN ROTATOR CUFF REPAIR,SUBACROMIAL DECOMPRESSION, ARTHROSCOPIC BICEPS TENODESIS;  Surgeon: Franky Cranker, MD;  Location: [facility] ORS;  Service: Orthopedics;  Laterality: Left;   SHOULDER SURGERY Left 2014   SPINE SURGERY       TOE FUSION Left 1970    little toe fusion   TONSILLECTOMY       TUBAL LIGATION       two left foot surgeries:polio related                    Patient Active Problem List    Diagnosis Date Noted   Small bowel obstruction (HCC) 04/12/2023   SBO (small bowel obstruction) (HCC) 04/12/2023   Primary hypertension 07/28/2021   Insomnia 07/28/2021   History of right mastectomy 06/24/2021   Pneumonia 03/03/2021   Respiratory failure with hypoxia (HCC) 03/03/2021   COPD with acute exacerbation (HCC) 03/03/2021   Wheelchair dependence 10/09/2019   Cervical radiculitis 04/04/2019   Cervical spondylosis with myelopathy and radiculopathy 02/01/2019   B12 deficiency 07/29/2018   Osteoarthritis of hip 09/29/2017   Ataxia 04/15/2017   Loss of memory 04/15/2017   Cardiac murmur 03/04/2017   Hx of adenomatous colonic polyps 12/10/2016   Tremor 08/11/2016   Personal history of tobacco use, presenting hazards to health 06/13/2016   Falls frequently 08/05/2014   Personal history of malignant neoplasm of breast 07/26/2014   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   Lumbosacral spondylosis without myelopathy 04/30/2013   Subacute lumbar radiculopathy 04/30/2013   Chronic low back pain 02/19/2013   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 Muscle weakness.    Rationale for Evaluation and Treatment: Rehabilitation   ONSET DATE: Initial surgery was a left shoulder replacement in ~June 2022. Left Reverse Total Shoulder in ~ April 2024. Left Scapular repair in ~ Nov 2024. May 13th, 2025 was surgical date for left conversion of failed reverse total shoulder hemiarthroplasty and revision scapula.    SUBJECTIVE:                                                                                                                                                                                       SUBJECTIVE STATEMENT: Pt. presents to physical therapy with 3/10 left shoulder pain currently s/p left conversion of failed reverse shoulder to hemiarthroplasty and revision scapula dual plating open reduction and internal fixation on 09/13/23. Pt. States that her pain is primarily over the superior and anterior aspect of the left humeral head and also into the left pectoral region. Pt. States that her pain can increase to a 7/10 at it's worst if she is laying on it, reaching, pulling, or pushing objects such as reaching for objects in the kitchen, putting her shirts on, or taking care of her cat. Easing factors include rest, hydrocodone  (10 mg), and Lidocaine  ointment which can alleviate sharp, pulling symptoms to a 0/10 at its best. Pt. Has not received any home PT services since her surgery. Pt. Denies any falls within the past 6 months or any  recent changes to her PMH.  Hand dominance: Right   PERTINENT HISTORY: Left shoulder replacement in ~June 2022 Left Reverse Total Shoulder in ~ April 2024 Left Scapular repair in ~ Nov 2024  May 13th, 2025 was surgical date for left conversion of failed reverse total shoulder hemiarthroplasty and revision scapula.      PAIN:  Are you having pain? Yes: NPRS scale: 3/10 currently, 7/10 at worst, 0/10 at best Pain location: anterior shoulder and into pectoral region, superior aspect of humeral head  Pain description: sharp, pulling Aggravating factors: Reaching, pulling, pushing, putting on clothing Relieving factors: heat, hydrocodone  medication (10 mg)   PRECAUTIONS: Fall   RED FLAGS: None           WEIGHT BEARING RESTRICTIONS: No   FALLS:  Has patient fallen in last 6 months? No   LIVING ENVIRONMENT: Lives with: lives with their spouse Lives in: House/apartment Stairs: No Has following equipment at home:  Quad cane small base, Wheelchair (power), and Ramped entry   OCCUPATION: Retired   PLOF: Independent with household mobility with device, Independent with community mobility with device, Independent with gait, Independent with transfers, Needs assistance with ADLs, and Needs assistance with homemaking   PATIENT GOALS:To increase muscle mass in L UE, to have the range of motion required to reach her hair and the cabinets in her kitchen, to become independent with cooking and cleaning tasks at home so she does not have to rely on her husband   NEXT MD VISIT: 02/02/24   OBJECTIVE:  Note: Objective measures were completed at Evaluation unless otherwise noted.   PATIENT SURVEYS:  QuickDash: 54.5%    COGNITION: Overall cognitive status: Within functional limits for tasks assessed                                  SENSATION: WFL   POSTURE: Forward head posture, rounded shoulders, excessive thoracic kyphosis    UPPER EXTREMITY ROM:    Active ROM Right eval Left eval   Shoulder flexion 126 deg.  62 deg.  Shoulder extension      Shoulder abduction  110 deg. 59 deg.  Shoulder adduction      Shoulder internal rotation  35 deg.  44 deg.  Shoulder external rotation  55 deg. 55 deg.  Elbow flexion      Elbow extension      Wrist flexion      Wrist extension      Wrist ulnar deviation      Wrist radial deviation      Wrist pronation      Wrist supination      (Blank rows = not tested)   Cervical Left Rotation: 30 deg. Cervical Right Rotation: 35 deg.  Passive Left Shoulder ER: 52 deg. Passive Left Shoulder IR: 55 deg. Passive Left Shoulder Flexion: 100 deg. Passive Left Shoulder Abduction: 95 deg.    UPPER EXTREMITY MMT:   MMT Right eval Left eval  Shoulder flexion 4  3-   Shoulder extension      Shoulder abduction  4- 3-   Shoulder adduction      Shoulder internal rotation 4-  3+  Shoulder external rotation  4-  3-  Middle trapezius      Lower trapezius      Elbow flexion  4+ 3   Elbow extension  4+  3*  Wrist flexion      Wrist extension      Wrist ulnar deviation      Wrist radial deviation      Wrist pronation      Wrist supination      Grip strength (lbs) 45.6# 38.8#   (Blank rows = not tested)     PALPATION:  Tenderness to superior and anterior aspect of humeral head. Significant atrophy noted in left periscapular region and around [facility] joint.  TREATMENT DATE: 12/31/2023   Subjective:   Pt. reports 3/10 left shoulder pain over the anterior aspect of the humerus at rest  prior to tx. session. Pt. States that her HEP has been going well and she was able to complete her updated exercises without a problem.     There.ex.: Supine left shoulder AAROM with therapist assistance (flexion, scaption, abduction) 1x10  Supine left shoulder isometrics with elbow by side and therapist manual resistance  (Flexion, extension) 2x10  Seated left shoulder isometrics against therapist (IR, ER, abduction), 2x10  Seated Shoulder Extensions with RTB, 2x12   Standing Left Shoulder Ladder, 1x15  Seated Nautilus Tricep Extensions, 2x8  Seated Nautilus exercises: scapular retractions, pulldowns, 2x15 each (verbal cueing to stop before the point of pain during shoulder flexion component of pulldowns)  Manual tx.: PROM of left shoulder in supine (Flexion, abduction, ER, IR)     PATIENT EDUCATION: Education details: Anatomy involved, diagnosis, prognosis, POC, HEP Person educated: Patient Education method: Explanation, Demonstration, Verbal cues, and Handouts Education comprehension: verbalized understanding and returned demonstration   HOME EXERCISE PROGRAM: Access Code: [email or web address] URL: [email or web address] Date: 12/15/2023 Prepared by: Ozell Sero Exercises - Supine Shoulder Press AAROM in Abduction with Dowel - 2 x daily - 7 x weekly - 2 sets - 10 reps - Supine Shoulder Flexion Extension AAROM with Dowel - 2 x daily - 7 x weekly - 2 sets - 10 reps - Supine Shoulder External Rotation in 45 Degrees Abduction AAROM with Dowel - 2 x daily - 7 x weekly - 2 sets - 10 reps - Scapular Retraction with Resistance - 1 x daily - 4-5 x weekly - 2 sets - 15 reps - Shoulder External Rotation and Scapular Retraction with Resistance - 1 x daily - 4-5 x weekly - 2 sets - 15 reps - Seated Shoulder Abduction with Self-Anchored Resistance - 1 x daily - 4-5 x weekly - 2 sets - 15 reps    ASSESSMENT:   CLINICAL IMPRESSION: Pt. Was introduced to three new seated Nautilus exercises on this day which were: lat pulldowns, tricep pulldowns, and scapular retractions. Pt. Was verbally cued to stop before the point of pain during eccentric shoulder flexion component of lat pulldowns. Pt. Was also cued to pause at the top of shoulder flexion during lat pulldowns and shoulder ladder exercises in order to promote a  prolonged stretch at end range. Pt. continues to experience discomfort and pain with manually resisted abduction isometrics but symptoms dissipated shortly after completion of the exercise. Pt. Also experienced pain with supine isometrics into flexion/extension with arm at 90 degrees of flexion so exercise was modified and performed with elbow by her side which was tolerable and performed without pain. Pt. will continue to benefit from skilled therapeutic intervention in order to address current deficits and activity limitations.    OBJECTIVE IMPAIRMENTS: Abnormal gait, decreased endurance, decreased ROM, decreased strength, impaired UE functional use, and pain.    ACTIVITY LIMITATIONS: carrying, lifting, sleeping, transfers, bathing, dressing, reach over head, hygiene/grooming, and locomotion level   PARTICIPATION LIMITATIONS: meal prep, cleaning, laundry, and driving   PERSONAL FACTORS: 3+ comorbidities: post-polio syndrome, COPD, PD are also affecting patient's functional outcome.    REHAB POTENTIAL: Good   CLINICAL DECISION MAKING: Evolving/moderate complexity   EVALUATION COMPLEXITY: Moderate     GOALS: Goals reviewed with patient? Yes   SHORT TERM GOALS: Target date: 01/05/24   Pt. will have less than a 5/10 left shoulder pain at its worst so that  she is able to put on her shirt in the morning and take care of her cat with greater ease.  Baseline: 7/10 at it's worst Goal status: INITIAL   2.  Pt. To increase active left shoulder external rotation to >50 deg. To improve ability to complete ADL/ dressing.   Baseline: see above Goal status: INITIAL   LONG TERM GOALS: Target date: 01/26/24   Pt. will total a <40% on the QuickDash Disability Symptom score so that she self perceives a significant improvement in her daily household activities such as doing household chores.  Baseline: 54.5% Goal status: INITIAL   2.  Pt. will improve gross left UE strength to at least a 4/5 in  order to improve ability to reach for and carry items in her kitchen in order to cook her own meals.  Baseline: Patient currently relies on husband for cooking meals and cannot raise UEs to reach cabinets.   Goal status: INITIAL   3.  Pt. to improve active left shoulder flexion and abduction to at least 90 degrees to improve ability for her to wash and do her hair.  Baseline:  See above Goal status: INITIAL   PLAN:   PT FREQUENCY: 2x/week   PT DURATION: 6 weeks   PLANNED INTERVENTIONS: 97110-Therapeutic exercises, 97530- Therapeutic activity, [id number]- Neuromuscular re-education, 97535- Self Care, and [id number]- Manual therapy   PLAN FOR NEXT SESSION: Challenge endurance capacity of left shoulder musculature through supine rhythmic stabilization exercises.   Curtistine Bracket, SPT   Ileene Ozell BROCKS, PT 12/31/2023

## 2024-01-03 ENCOUNTER — Other Ambulatory Visit: Payer: Self-pay | Admitting: Surgery

## 2024-01-03 ENCOUNTER — Ambulatory Visit
Admission: RE | Admit: 2024-01-03 | Discharge: 2024-01-03 | Disposition: A | Discharge status: Home / Self Care | Source: Ambulatory Visit | Attending: Surgery | Admitting: Surgery

## 2024-01-03 DIAGNOSIS — M810 Age-related osteoporosis without current pathological fracture: Secondary | ICD-10-CM | POA: Insufficient documentation

## 2024-01-03 DIAGNOSIS — Z1231 Encounter for screening mammogram for malignant neoplasm of breast: Secondary | ICD-10-CM | POA: Insufficient documentation

## 2024-01-03 DIAGNOSIS — Z853 Personal history of malignant neoplasm of breast: Secondary | ICD-10-CM | POA: Diagnosis present

## 2024-01-04 ENCOUNTER — Encounter: Admitting: Physical Therapy

## 2024-01-05 ENCOUNTER — Other Ambulatory Visit
Admission: RE | Admit: 2024-01-05 | Discharge: 2024-01-05 | Disposition: A | Discharge status: Home / Self Care | Payer: Self-pay | Source: Ambulatory Visit | Attending: Medical Genetics | Admitting: Medical Genetics

## 2024-01-06 ENCOUNTER — Ambulatory Visit: Attending: Orthopedic Surgery | Admitting: Physical Therapy

## 2024-01-06 DIAGNOSIS — M25512 Pain in left shoulder: Secondary | ICD-10-CM | POA: Diagnosis present

## 2024-01-06 DIAGNOSIS — M6281 Muscle weakness (generalized): Secondary | ICD-10-CM | POA: Insufficient documentation

## 2024-01-06 DIAGNOSIS — M25611 Stiffness of right shoulder, not elsewhere classified: Secondary | ICD-10-CM | POA: Insufficient documentation

## 2024-01-06 DIAGNOSIS — Z96612 Presence of left artificial shoulder joint: Secondary | ICD-10-CM | POA: Insufficient documentation

## 2024-01-06 NOTE — Progress Notes (Signed)
 Expand All Collapse All   OUTPATIENT PHYSICAL THERAPY SHOULDER TREATMENT   Patient Name: Andrea [name] MRN: [id number] DOB:March 10, 1951, 73 y.o., female Today's Date: 01/06/2024    PT End of Session - 01/06/24 1258     Visit Number 5    Number of Visits 12    Date for PT Re-Evaluation 01/26/24    PT Start Time 1112    PT Stop Time 1200    PT Time Calculation (min) 48 min    Activity Tolerance Patient tolerated treatment well;Patient limited by fatigue    Behavior During Therapy [facility] for tasks assessed/performed                 Past Medical History:  Diagnosis Date   Allergic rhinitis due to allergen     Allergy     Breast cancer (HCC) 2008    RT LUMPECTOMY   Breast cancer (HCC) 2011    RT MASTECTOMY   Cancer (HCC) 7991,7988      High-grade DCIS,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   Cervical post-laminectomy syndrome     Chronic constipation     Colon polyp 2010   Complication of anesthesia      itching real bad or msucles spasms   COPD (chronic obstructive pulmonary disease) (HCC) 02/21/2019   Cubital tunnel syndrome     Depression     Disorders of sacrum     Disturbance of skin sensation     Emphysema lung (HCC) 2020   GERD (gastroesophageal reflux disease)     Heart murmur     History of abnormal cervical Pap smear     History of cataract     History of colon polyps     History of pneumonia 2008   Hot flashes     Hx of diplopia      right eye   Hyperlipidemia     Hypertension     Insomnia     Late effects of acute poliomyelitis     Lateral epicondylitis  of elbow     Loss of memory     Menopausal state     Neck pain     Osteoporosis     Pain in joint of left shoulder     Parkinson's disease (HCC) 2011   Peripheral vascular disease (HCC) 2011    venous stasis    Personal history of radiation therapy     PONV (postoperative nausea and vomiting)     Post-polio syndrome     Radiation 2008    BREAST CA   Sleep apnea     Tremor     Unspecified  musculoskeletal disorders and symptoms referable to neck      cervical/trapezius   Vitamin D  deficiency                    Past Surgical History:  Procedure Laterality Date   ABDOMINAL HYSTERECTOMY   1997   ANTERIOR CERVICAL DECOMP/DISCECTOMY FUSION N/A 02/01/2019    Procedure: Anterior Cervical Decompression Fusion Cervical three-four, Cervical four-five, hardware removal Cervical five-six;  Surgeon: Louis Shove, MD;  Location: [facility] OR;  Service: Neurosurgery;  Laterality: N/A;   BOWEL RESECTION N/A 04/12/2023    Procedure: SMALL BOWEL RESECTION;  Surgeon: Jordis Laneta FALCON, MD;  Location: [facility] ORS;  Service: General;  Laterality: N/A;   BREAST LUMPECTOMY Right 2008    4 cm area of DCIS, margins less than 1 mm. Treated with wide excision, whole breast radiation  BREAST SURGERY Right 12/04/2009    Right simple mastectomy, sentinel node biopsy.   CARPAL TUNNEL RELEASE       COLONOSCOPY   2010,2015    Dr. Jinny, Dr. Viktoria   COLONOSCOPY       COLONOSCOPY N/A 06/18/2022    Procedure: COLONOSCOPY;  Surgeon: Onita Elspeth Sharper, DO;  Location: [facility] ENDOSCOPY;  Service: Gastroenterology;  Laterality: N/A;   COLONOSCOPY N/A 10/07/2022    Procedure: COLONOSCOPY;  Surgeon: Onita Elspeth Sharper, DO;  Location: [facility] ENDOSCOPY;  Service: Gastroenterology;  Laterality: N/A;   COLONOSCOPY WITH PROPOFOL  N/A 05/23/2017    Procedure: COLONOSCOPY WITH PROPOFOL ;  Surgeon: Viktoria Lamar DASEN, MD;  Location: [facility] ENDOSCOPY;  Service: Endoscopy;  Laterality: N/A;   ELBOW SURGERY Left 2011   ELBOW SURGERY Left 2015   ESOPHAGOGASTRODUODENOSCOPY       ESOPHAGOGASTRODUODENOSCOPY N/A 08/20/2021    Procedure: ESOPHAGOGASTRODUODENOSCOPY (EGD);  Surgeon: Onita Elspeth Sharper, DO;  Location: [facility] ENDOSCOPY;  Service: Gastroenterology;  Laterality: N/A;   FLEXIBLE SIGMOIDOSCOPY   07/19/2000   HIP SURGERY Right 06/19/2012   LAPAROTOMY N/A 04/12/2023    Procedure: EXPLORATORY LAPAROTOMY;  Surgeon: Jordis Laneta FALCON, MD;   Location: [facility] ORS;  Service: General;  Laterality: N/A;   LEG SURGERY Right 1958    Corrcetive surgery for polio   LEG SURGERY Left 1958    corrective surgery for polio   MASTECTOMY Right 2011    BREAST CA   rectocele/enterocelle repair and perinoplasty       SHOULDER ARTHROSCOPY W/ ROTATOR CUFF REPAIR Bilateral 2010,2012   SHOULDER ARTHROSCOPY WITH OPEN ROTATOR CUFF REPAIR Left 08/19/2015    Procedure: SHOULDER ARTHROSCOPY WITH  MINI OPEN ROTATOR CUFF REPAIR,SUBACROMIAL DECOMPRESSION, ARTHROSCOPIC BICEPS TENODESIS;  Surgeon: Franky Cranker, MD;  Location: [facility] ORS;  Service: Orthopedics;  Laterality: Left;   SHOULDER SURGERY Left 2014   SPINE SURGERY       TOE FUSION Left 1970    little toe fusion   TONSILLECTOMY       TUBAL LIGATION       two left foot surgeries:polio related                    Patient Active Problem List    Diagnosis Date Noted   Small bowel obstruction (HCC) 04/12/2023   SBO (small bowel obstruction) (HCC) 04/12/2023   Primary hypertension 07/28/2021   Insomnia 07/28/2021   History of right mastectomy 06/24/2021   Pneumonia 03/03/2021   Respiratory failure with hypoxia (HCC) 03/03/2021   COPD with acute exacerbation (HCC) 03/03/2021   Wheelchair dependence 10/09/2019   Cervical radiculitis 04/04/2019   Cervical spondylosis with myelopathy and radiculopathy 02/01/2019   B12 deficiency 07/29/2018   Osteoarthritis of hip 09/29/2017   Ataxia 04/15/2017   Loss of memory 04/15/2017   Cardiac murmur 03/04/2017   Hx of adenomatous colonic polyps 12/10/2016   Tremor 08/11/2016   Personal history of tobacco use, presenting hazards to health 06/13/2016   Falls frequently 08/05/2014   Personal history of malignant neoplasm of breast 07/26/2014   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   Lumbosacral spondylosis without myelopathy 04/30/2013   Subacute lumbar radiculopathy 04/30/2013   Chronic low back pain 02/19/2013   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 Muscle weakness.    Rationale for Evaluation and Treatment: Rehabilitation   ONSET DATE: Initial surgery was a left shoulder replacement in ~June 2022. Left Reverse Total Shoulder in ~ April 2024. Left Scapular repair in ~ Nov 2024. May 13th, 2025 was surgical date for left conversion of failed reverse total shoulder hemiarthroplasty and revision scapula.    SUBJECTIVE:                                                                                                                                                                                       SUBJECTIVE STATEMENT: Pt. presents to physical therapy with 3/10 left shoulder pain currently s/p left conversion of failed reverse shoulder to hemiarthroplasty and revision scapula dual plating open reduction and internal fixation on 09/13/23. Pt. States that her pain is primarily over the superior and anterior aspect of the left humeral head and also into the left pectoral region. Pt. States that her pain can increase to a 7/10 at it's worst if she is laying on it, reaching, pulling, or pushing objects such as reaching for objects in the kitchen, putting her shirts on, or taking care of her cat. Easing factors include rest, hydrocodone  (10 mg), and Lidocaine  ointment which can alleviate sharp, pulling symptoms to a 0/10 at its best. Pt. Has not received any home PT services since her surgery. Pt. Denies any falls within the past 6  months or any recent changes to her PMH.  Hand dominance: Right   PERTINENT HISTORY: Left shoulder replacement in ~June 2022 Left Reverse Total Shoulder in ~ April 2024 Left Scapular repair in ~ Nov 2024  May 13th, 2025 was surgical date for left conversion of failed reverse total shoulder hemiarthroplasty and revision scapula.      PAIN:  Are you having pain? Yes: NPRS scale: 3/10 currently, 7/10 at worst, 0/10 at best Pain location: anterior shoulder and into pectoral region, superior aspect of humeral head  Pain description: sharp, pulling Aggravating factors: Reaching, pulling, pushing, putting on clothing Relieving factors: heat, hydrocodone  medication (10 mg)   PRECAUTIONS: Fall   RED FLAGS: None           WEIGHT BEARING RESTRICTIONS: No   FALLS:  Has patient fallen in last 6 months? No   LIVING ENVIRONMENT: Lives with: lives with their spouse Lives in: House/apartment Stairs: No Has following equipment at home:  Quad cane small base, Wheelchair (power), and Ramped entry   OCCUPATION: Retired   PLOF: Independent with household mobility with device, Independent with community mobility with device, Independent with gait, Independent with transfers, Needs assistance with ADLs, and Needs assistance with homemaking   PATIENT GOALS:To increase muscle mass in L UE, to have the range of motion required to reach her hair and the cabinets in her kitchen, to become independent with cooking and cleaning tasks at home so she does not have to rely on her husband   NEXT MD VISIT: 02/02/24   OBJECTIVE:  Note: Objective measures were completed at Evaluation unless otherwise noted.   PATIENT SURVEYS:  QuickDash: 54.5%    COGNITION: Overall cognitive status: Within functional limits for tasks assessed                                  SENSATION: WFL   POSTURE: Forward head posture, rounded shoulders, excessive thoracic kyphosis    UPPER EXTREMITY ROM:    Active ROM  Right eval Left eval  Shoulder flexion 126 deg.  62 deg.  Shoulder extension      Shoulder abduction  110 deg. 59 deg.  Shoulder adduction      Shoulder internal rotation  35 deg.  44 deg.  Shoulder external rotation  55 deg. 55 deg.  Elbow flexion      Elbow extension      Wrist flexion      Wrist extension      Wrist ulnar deviation      Wrist radial deviation      Wrist pronation      Wrist supination      (Blank rows = not tested)   Cervical Left Rotation: 30 deg. Cervical Right Rotation: 35 deg.  Passive Left Shoulder ER: 52 deg. Passive Left Shoulder IR: 55 deg. Passive Left Shoulder Flexion: 100 deg. Passive Left Shoulder Abduction: 95 deg.    UPPER EXTREMITY MMT:   MMT Right eval Left eval  Shoulder flexion 4  3-   Shoulder extension      Shoulder abduction  4- 3-   Shoulder adduction      Shoulder internal rotation 4-  3+  Shoulder external rotation  4-  3-  Middle trapezius      Lower trapezius      Elbow flexion  4+ 3   Elbow extension  4+  3*  Wrist flexion      Wrist extension      Wrist ulnar deviation      Wrist radial deviation      Wrist pronation      Wrist supination      Grip strength (lbs) 45.6# 38.8#   (Blank rows = not tested)     PALPATION:  Tenderness to superior and anterior aspect of humeral head. Significant atrophy noted in left periscapular region and around [facility] joint.  TREATMENT DATE: 01/07/2024  Subjective:   Pt. reports 3/10 left shoulder pain over the anterior aspect of the humerus at rest  prior to tx. session. Pt. Denies any recent falls or troubles completing her HEP.      There.ex.: Supine left shoulder AAROM with therapist assistance (flexion, scaption, abduction) 1x10  Sidelying left shoulder flexion 10x no resistance, 10x with 1# dumbbell   Supine left shoulder isometrics with elbow  by side and therapist manual resistance (Flexion, extension) 2x10  Supine left shoulder rhythmic stabilizations to 90 degrees of flexion, 3x30 seconds   Seated left shoulder isometrics against therapist (IR, ER, abduction), 2x10   Seated Shoulder Extensions with RTB, 2x12    Standing Left Shoulder Ladder, 1x15   Seated Nautilus Tricep Extensions, 2x8   Seated Nautilus exercises: scapular retractions, 2x15    Manual tx.: PROM of left shoulder in supine (Flexion, abduction, ER, IR)      PATIENT EDUCATION: Education details: Anatomy involved, diagnosis, prognosis, POC, HEP Person educated: Patient Education method: Explanation, Demonstration, Verbal cues, and Handouts Education comprehension: verbalized understanding and returned demonstration   HOME EXERCISE PROGRAM: Access Code: [email or web address] URL: [email or web address] Date: 12/15/2023 Prepared by: Ozell Sero Exercises - Supine Shoulder Press AAROM in Abduction with Dowel - 2 x daily - 7 x weekly - 2 sets - 10 reps - Supine Shoulder Flexion Extension AAROM with Dowel - 2 x daily - 7 x weekly - 2 sets - 10 reps - Supine Shoulder External Rotation in 45 Degrees Abduction AAROM with Dowel - 2 x daily - 7 x weekly - 2 sets - 10 reps - Scapular Retraction with Resistance - 1 x daily - 4-5 x weekly - 2 sets - 15 reps - Shoulder External Rotation and Scapular Retraction with Resistance - 1 x daily - 4-5 x weekly - 2 sets - 15 reps - Seated Shoulder Abduction with Self-Anchored Resistance - 1 x daily - 4-5 x weekly - 2 sets - 15 reps    ASSESSMENT:   CLINICAL IMPRESSION: Pt. Presents to physical therapy with mild left shoulder pain which did not increase by the end of tx. Session. Pt. Was introduced to supine left shoulder rhythmic stabilizations to 90 degrees of flexion to challenge endurance capacity of shoulder musculature which she was able to complete without significant difficulty or pain. Deferred lat pulldowns on Nautilus on  this day due to increase in left shoulder pain after last session which pt. Attributes to the exercise. Pt. is still unable to actively achieve 90 degrees of shoulder flexion and continues to compensate with left upper trap during elevation, with proper scapular motions observed. Pt. Was then introduced active shoulder flexion (with and without 1# dumbbell) in sidelying position in order to eliminate some of the resistance from gravity which allowed her to reach ~100 degrees of flexion actively. Pt. will continue to benefit from skilled therapeutic intervention in order to address current deficits and activity limitations.    OBJECTIVE IMPAIRMENTS: Abnormal gait, decreased endurance, decreased ROM, decreased strength, impaired UE functional use, and pain.    ACTIVITY LIMITATIONS: carrying, lifting, sleeping, transfers, bathing, dressing, reach over head, hygiene/grooming, and locomotion level   PARTICIPATION LIMITATIONS: meal prep, cleaning, laundry, and driving   PERSONAL FACTORS: 3+ comorbidities: post-polio syndrome, COPD, PD are also affecting patient's functional outcome.    REHAB POTENTIAL: Good   CLINICAL DECISION MAKING: Evolving/moderate complexity   EVALUATION COMPLEXITY: Moderate     GOALS: Goals reviewed with patient? Yes   SHORT TERM GOALS:  Target date: 01/05/24   Pt. will have less than a 5/10 left shoulder pain at its worst so that she is able to put on her shirt in the morning and take care of her cat with greater ease.  Baseline: 7/10 at it's worst Goal status: Not met   2.  Pt. To increase active left shoulder external rotation to >50 deg. To improve ability to complete ADL/ dressing.   Baseline: see above Goal status: On-going   LONG TERM GOALS: Target date: 01/26/24   Pt. will total a <40% on the QuickDash Disability Symptom score so that she self perceives a significant improvement in her daily household activities such as doing household chores.  Baseline:  54.5% Goal status: INITIAL   2.  Pt. will improve gross left UE strength to at least a 4/5 in order to improve ability to reach for and carry items in her kitchen in order to cook her own meals.  Baseline: Patient currently relies on husband for cooking meals and cannot raise UEs to reach cabinets.   Goal status: INITIAL   3.  Pt. to improve active left shoulder flexion and abduction to at least 90 degrees to improve ability for her to wash and do her hair.  Baseline:  See above Goal status: INITIAL   PLAN:   PT FREQUENCY: 2x/week   PT DURATION: 6 weeks   PLANNED INTERVENTIONS: 97110-Therapeutic exercises, 97530- Therapeutic activity, [id number]- Neuromuscular re-education, 97535- Self Care, and [id number]- Manual therapy   PLAN FOR NEXT SESSION: Continue general mobility/periscapular strengthening exercises to endurance as tolerable.  CHECK GOALS   Curtistine Bracket, SPT   Ileene Ozell BROCKS, [place] 01/07/2024

## 2024-01-10 ENCOUNTER — Ambulatory Visit: Admitting: Physical Therapy

## 2024-01-10 DIAGNOSIS — M6281 Muscle weakness (generalized): Secondary | ICD-10-CM

## 2024-01-10 DIAGNOSIS — M25512 Pain in left shoulder: Secondary | ICD-10-CM

## 2024-01-10 DIAGNOSIS — Z96612 Presence of left artificial shoulder joint: Secondary | ICD-10-CM

## 2024-01-10 DIAGNOSIS — M25611 Stiffness of right shoulder, not elsewhere classified: Secondary | ICD-10-CM

## 2024-01-10 NOTE — Therapy (Unsigned)
 OUTPATIENT PHYSICAL THERAPY SHOULDER TREATMENT   Patient Name: Andrea [name] MRN: [id number] DOB:1951/01/27, 73 y.o., female Today's Date: 01/10/2024    PT End of Session - 01/10/24 1116     Visit Number 6    Number of Visits 12    Date for PT Re-Evaluation 01/26/24    PT Start Time 1116    PT Stop Time 1200    PT Time Calculation (min) 44 min    Activity Tolerance Patient tolerated treatment well;Patient limited by fatigue;Patient limited by pain    Behavior During Therapy [facility] for tasks assessed/performed                Past Medical History:  Diagnosis Date   Allergic rhinitis due to allergen     Allergy     Breast cancer (HCC) 2008    RT LUMPECTOMY   Breast cancer (HCC) 2011    RT MASTECTOMY   Cancer (HCC) 7991,7988      High-grade DCIS,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   Cervical post-laminectomy syndrome     Chronic constipation     Colon polyp 2010   Complication of anesthesia      itching real bad or msucles spasms   COPD (chronic obstructive pulmonary disease) (HCC) 02/21/2019   Cubital tunnel syndrome     Depression     Disorders of sacrum     Disturbance of skin sensation     Emphysema lung (HCC) 2020   GERD (gastroesophageal reflux disease)     Heart murmur     History of abnormal cervical Pap smear     History of cataract     History of colon polyps     History of pneumonia 2008   Hot flashes     Hx of diplopia      right eye   Hyperlipidemia     Hypertension     Insomnia     Late effects of acute poliomyelitis     Lateral epicondylitis  of elbow     Loss of memory     Menopausal state     Neck pain     Osteoporosis     Pain in joint of left shoulder     Parkinson's disease (HCC) 2011   Peripheral vascular disease (HCC) 2011    venous stasis    Personal history of radiation therapy     PONV (postoperative nausea and vomiting)     Post-polio syndrome     Radiation 2008    BREAST CA   Sleep apnea     Tremor      Unspecified musculoskeletal disorders and symptoms referable to neck      cervical/trapezius   Vitamin D  deficiency                    Past Surgical History:  Procedure Laterality Date   ABDOMINAL HYSTERECTOMY   1997   ANTERIOR CERVICAL DECOMP/DISCECTOMY FUSION N/A 02/01/2019    Procedure: Anterior Cervical Decompression Fusion Cervical three-four, Cervical four-five, hardware removal Cervical five-six;  Surgeon: Louis Shove, MD;  Location: [facility] OR;  Service: Neurosurgery;  Laterality: N/A;   BOWEL RESECTION N/A 04/12/2023    Procedure: SMALL BOWEL RESECTION;  Surgeon: Jordis Laneta FALCON, MD;  Location: [facility] ORS;  Service: General;  Laterality: N/A;   BREAST LUMPECTOMY Right 2008    4 cm area of DCIS, margins less than 1 mm. Treated with wide excision, whole  breast radiation   BREAST SURGERY Right 12/04/2009    Right simple mastectomy, sentinel node biopsy.   CARPAL TUNNEL RELEASE       COLONOSCOPY   2010,2015    Dr. Jinny, Dr. Viktoria   COLONOSCOPY       COLONOSCOPY N/A 06/18/2022    Procedure: COLONOSCOPY;  Surgeon: Onita Elspeth Sharper, DO;  Location: [facility] ENDOSCOPY;  Service: Gastroenterology;  Laterality: N/A;   COLONOSCOPY N/A 10/07/2022    Procedure: COLONOSCOPY;  Surgeon: Onita Elspeth Sharper, DO;  Location: [facility] ENDOSCOPY;  Service: Gastroenterology;  Laterality: N/A;   COLONOSCOPY WITH PROPOFOL  N/A 05/23/2017    Procedure: COLONOSCOPY WITH PROPOFOL ;  Surgeon: Viktoria Lamar DASEN, MD;  Location: [facility] ENDOSCOPY;  Service: Endoscopy;  Laterality: N/A;   ELBOW SURGERY Left 2011   ELBOW SURGERY Left 2015   ESOPHAGOGASTRODUODENOSCOPY       ESOPHAGOGASTRODUODENOSCOPY N/A 08/20/2021    Procedure: ESOPHAGOGASTRODUODENOSCOPY (EGD);  Surgeon: Onita Elspeth Sharper, DO;  Location: [facility] ENDOSCOPY;  Service: Gastroenterology;  Laterality: N/A;   FLEXIBLE SIGMOIDOSCOPY   07/19/2000   HIP SURGERY Right 06/19/2012   LAPAROTOMY N/A 04/12/2023    Procedure: EXPLORATORY LAPAROTOMY;  Surgeon: Jordis Laneta FALCON, MD;  Location: [facility] ORS;  Service: General;  Laterality: N/A;   LEG SURGERY Right 1958    Corrcetive surgery for polio   LEG SURGERY Left 1958    corrective surgery for polio   MASTECTOMY Right 2011    BREAST CA   rectocele/enterocelle repair and perinoplasty       SHOULDER ARTHROSCOPY W/ ROTATOR CUFF REPAIR Bilateral 2010,2012   SHOULDER ARTHROSCOPY WITH OPEN ROTATOR CUFF REPAIR Left 08/19/2015    Procedure: SHOULDER ARTHROSCOPY WITH  MINI OPEN ROTATOR CUFF REPAIR,SUBACROMIAL DECOMPRESSION, ARTHROSCOPIC BICEPS TENODESIS;  Surgeon: Franky Cranker, MD;  Location: [facility] ORS;  Service: Orthopedics;  Laterality: Left;   SHOULDER SURGERY Left 2014   SPINE SURGERY       TOE FUSION Left 1970    little toe fusion   TONSILLECTOMY       TUBAL LIGATION       two left foot surgeries:polio related                    Patient Active Problem List    Diagnosis Date Noted   Small bowel obstruction (HCC) 04/12/2023   SBO (small bowel obstruction) (HCC) 04/12/2023   Primary hypertension 07/28/2021   Insomnia 07/28/2021   History of right mastectomy 06/24/2021   Pneumonia 03/03/2021   Respiratory failure with hypoxia (HCC) 03/03/2021   COPD with acute exacerbation (HCC) 03/03/2021   Wheelchair dependence 10/09/2019   Cervical radiculitis 04/04/2019   Cervical spondylosis with myelopathy and radiculopathy 02/01/2019   B12 deficiency 07/29/2018   Osteoarthritis of hip 09/29/2017   Ataxia 04/15/2017   Loss of memory 04/15/2017   Cardiac murmur 03/04/2017   Hx of adenomatous colonic polyps 12/10/2016   Tremor 08/11/2016   Personal history of tobacco use, presenting hazards to health 06/13/2016   Falls frequently 08/05/2014   Personal history of malignant neoplasm of breast 07/26/2014   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 Muscle weakness.    Rationale for Evaluation and Treatment: Rehabilitation   ONSET DATE: Initial surgery was a left shoulder replacement in ~June 2022. Left Reverse Total Shoulder in ~ April 2024. Left Scapular repair in ~ Nov 2024. May 13th, 2025 was surgical date for left conversion of failed reverse total shoulder hemiarthroplasty and revision scapula.    SUBJECTIVE:                                                                                                                                                                                       SUBJECTIVE STATEMENT: Pt. presents to physical therapy with 3/10 left shoulder pain currently s/p left conversion of failed reverse shoulder to hemiarthroplasty and revision scapula dual plating open reduction and internal fixation on 09/13/23. Pt. States that her pain is primarily over the superior and anterior aspect of the left humeral head and also into the left pectoral region. Pt. States that her pain can increase to a 7/10 at it's worst if she is laying on it, reaching, pulling, or pushing objects such as reaching for objects in the kitchen, putting her shirts on, or taking care of her cat. Easing factors include rest, hydrocodone  (10 mg), and Lidocaine  ointment which can alleviate sharp, pulling symptoms to a 0/10 at its best. Pt. Has not received any home PT services since her surgery. Pt. Denies any falls  within the past 6 months or any recent changes to her PMH.  Hand dominance: Right   PERTINENT HISTORY: Left shoulder replacement in ~June 2022 Left Reverse Total Shoulder in ~ April 2024 Left Scapular repair in ~ Nov 2024  May 13th, 2025 was surgical date for left conversion of failed reverse total shoulder hemiarthroplasty and revision scapula.    PAIN:  Are you having pain? Yes: NPRS scale: 3/10 currently, 7/10 at worst, 0/10 at best Pain location: anterior shoulder and into pectoral region, superior aspect of humeral head  Pain description: sharp, pulling Aggravating factors: Reaching, pulling, pushing, putting on clothing Relieving factors: heat, hydrocodone  medication (10 mg)   PRECAUTIONS: Fall   RED FLAGS: None           WEIGHT BEARING RESTRICTIONS: No   FALLS:  Has patient fallen in last 6 months? No   LIVING ENVIRONMENT: Lives with: lives with their spouse Lives in: House/apartment Stairs: No Has following equipment  at home: Counselling psychologist, Wheelchair (power), and Ramped entry   OCCUPATION: Retired   PLOF: Independent with household mobility with device, Independent with community mobility with device, Independent with gait, Independent with transfers, Needs assistance with ADLs, and Needs assistance with homemaking   PATIENT GOALS:To increase muscle mass in L UE, to have the range of motion required to reach her hair and the cabinets in her kitchen, to become independent with cooking and cleaning tasks at home so she does not have to rely on her husband   NEXT MD VISIT: 02/02/24   OBJECTIVE:  Note: Objective measures were completed at Evaluation unless otherwise noted.   PATIENT SURVEYS:  QuickDash: 54.5%    COGNITION: Overall cognitive status: Within functional limits for tasks assessed                                  SENSATION: WFL   POSTURE: Forward head posture, rounded shoulders, excessive thoracic kyphosis    UPPER EXTREMITY ROM:     Active ROM Right eval Left eval  Shoulder flexion 126 deg.  62 deg.  Shoulder extension      Shoulder abduction  110 deg. 59 deg.  Shoulder adduction      Shoulder internal rotation  35 deg.  44 deg.  Shoulder external rotation  55 deg. 55 deg.  Elbow flexion      Elbow extension      Wrist flexion      Wrist extension      Wrist ulnar deviation      Wrist radial deviation      Wrist pronation      Wrist supination      (Blank rows = not tested)   Cervical Left Rotation: 30 deg. Cervical Right Rotation: 35 deg.  Passive Left Shoulder ER: 52 deg. Passive Left Shoulder IR: 55 deg. Passive Left Shoulder Flexion: 100 deg. Passive Left Shoulder Abduction: 95 deg.    UPPER EXTREMITY MMT:   MMT Right eval Left eval  Shoulder flexion 4  3-   Shoulder extension      Shoulder abduction  4- 3-   Shoulder adduction      Shoulder internal rotation 4-  3+  Shoulder external rotation  4-  3-  Middle trapezius      Lower trapezius      Elbow flexion  4+ 3   Elbow extension  4+  3*  Wrist flexion      Wrist extension      Wrist ulnar deviation      Wrist radial deviation      Wrist pronation      Wrist supination      Grip strength (lbs) 45.6# 38.8#   (Blank rows = not tested)     PALPATION:  Tenderness to superior and anterior aspect of humeral head. Significant atrophy noted in left periscapular region and around [facility] joint.  TREATMENT DATE: 01/10/2024   Subjective:   Pt. reports 3/10 left shoulder pain over the anterior and superior aspects of the humerus prior to tx. session. Pt. States that her pain during activity can still reach a 7/10 at it's worst. Pt. states that she has been adhering to her HEP daily, which causes some soreness but does not increase pain.   There.ex.: Supine left shoulder AAROM with therapist assistance (flexion,  scaption, abduction) 1x10  Supine rhythmic stabilization with shoulder flexed to 90 degrees, 4x30 seconds  Supine serratus punches with 1# DB, 1x10  Supine D2 flexion, 5x total  Supine left shoulder isometrics with elbow by side and therapist manual resistance (Flexion, extension) 2x10  Seated left shoulder isometrics against therapist (IR, ER, abduction), 2x10  Standing Left Shoulder Ladder, 1x15  Seated Nautilus Tricep Extensions, 2x8, 20#  Seated Nautilus exercises: scapular retractions, pulldowns, 2x15 each (verbal cueing to stop before the point of pain during shoulder flexion component of pulldowns), 30#  Standing ball circles against wall (cw and ccw, 3 sets to failure, ~20 seconds)  Manual tx.: PROM of left shoulder in supine (Flexion, abduction, ER, IR)     PATIENT EDUCATION: Education details: Anatomy involved, diagnosis, prognosis, POC, HEP Person educated: Patient Education method: Explanation, Demonstration, Verbal cues, and Handouts Education comprehension: verbalized understanding and returned demonstration   HOME EXERCISE PROGRAM: Access Code: [email or web address] URL: [email or web address] Date: 12/15/2023 Prepared by: Ozell Sero Exercises - Supine Shoulder Press AAROM in Abduction with Dowel - 2 x daily - 7 x weekly - 2 sets - 10 reps - Supine Shoulder Flexion Extension AAROM with Dowel - 2 x daily - 7 x weekly - 2 sets - 10 reps - Supine Shoulder External Rotation in 45 Degrees Abduction AAROM with Dowel - 2 x daily - 7 x weekly - 2 sets - 10 reps - Scapular Retraction with Resistance - 1 x daily - 4-5 x weekly - 2 sets - 15 reps - Shoulder External Rotation and Scapular Retraction with Resistance - 1 x daily - 4-5 x weekly - 2 sets - 15 reps - Seated Shoulder Abduction with Self-Anchored Resistance - 1 x daily - 4-5 x weekly - 2 sets - 15 reps    ASSESSMENT:   CLINICAL IMPRESSION: Pt. was introduced to four new exercise on this day which were standing ball  circles against wall, supine D2 flexion, supine serratus punches, and supine rhythmic stabilization with shoulder flexed to 90 degrees. Pt. Was able to tolerate supine rhythmic stabilization exercise, supine serratus punches, and supine D2 flexion without significant difficulty or any increase in pain but did experience difficulty with maintaining active left shoulder flexion to 90 degrees for long enough to complete the exercise. Due to muscular fatigue, pt. Provided with min A from therapist under left elbow to maintain arm in 90 degrees of flexion but still experienced difficulty and fatigue with clockwise ball circles. Pt. demonstrating improvement in endurance and strength capacity of left shoulder during supine isometric exercises with less rest breaks required to recover from fatigue. Pt. will continue to benefit from skilled therapeutic intervention in order to address current deficits and activity limitations.    OBJECTIVE IMPAIRMENTS: Abnormal gait, decreased endurance, decreased ROM, decreased strength, impaired UE functional use, and pain.    ACTIVITY LIMITATIONS: carrying, lifting, sleeping, transfers, bathing, dressing, reach over head, hygiene/grooming, and locomotion level   PARTICIPATION LIMITATIONS: meal prep, cleaning, laundry, and driving   PERSONAL FACTORS: 3+ comorbidities: post-polio syndrome, COPD, PD are also affecting patient's  functional outcome.    REHAB POTENTIAL: Good   CLINICAL DECISION MAKING: Evolving/moderate complexity   EVALUATION COMPLEXITY: Moderate     GOALS: Goals reviewed with patient? Yes   SHORT TERM GOALS: Target date: 01/05/24   Pt. will have less than a 5/10 left shoulder pain at its worst so that she is able to put on her shirt in the morning and take care of her cat with greater ease.  Baseline: 7/10 at it's worst Goal status: Not met   2.  Pt. To increase active left shoulder external rotation to >50 deg. To improve ability to complete ADL/  dressing.   Baseline: see above Goal status: On-going   LONG TERM GOALS: Target date: 01/26/24   Pt. will total a <40% on the QuickDash Disability Symptom score so that she self perceives a significant improvement in her daily household activities such as doing household chores.  Baseline: 54.5% Goal status: INITIAL   2.  Pt. will improve gross left UE strength to at least a 4/5 in order to improve ability to reach for and carry items in her kitchen in order to cook her own meals.  Baseline: Patient currently relies on husband for cooking meals and cannot raise UEs to reach cabinets.   Goal status: INITIAL   3.  Pt. to improve active left shoulder flexion and abduction to at least 90 degrees to improve ability for her to wash and do her hair.  Baseline:  See above Goal status: INITIAL   PLAN:   PT FREQUENCY: 2x/week   PT DURATION: 6 weeks   PLANNED INTERVENTIONS: 97110-Therapeutic exercises, 97530- Therapeutic activity, [id number]- Neuromuscular re-education, 97535- Self Care, and [id number]- Manual therapy   PLAN FOR NEXT SESSION: Increase periscapular strengthening activities to tolerance (increased reps on Nautilus and seated TB activities).  UPDATE GOALS   Curtistine Bracket, SPT   Ileene Ozell BROCKS, [place] 01/10/2024

## 2024-01-11 ENCOUNTER — Encounter: Payer: Self-pay | Admitting: Physical Therapy

## 2024-01-12 ENCOUNTER — Ambulatory Visit: Admitting: Physical Therapy

## 2024-01-12 DIAGNOSIS — M25611 Stiffness of right shoulder, not elsewhere classified: Secondary | ICD-10-CM

## 2024-01-12 DIAGNOSIS — M25512 Pain in left shoulder: Secondary | ICD-10-CM | POA: Diagnosis not present

## 2024-01-12 DIAGNOSIS — Z96612 Presence of left artificial shoulder joint: Secondary | ICD-10-CM

## 2024-01-12 DIAGNOSIS — M6281 Muscle weakness (generalized): Secondary | ICD-10-CM

## 2024-01-12 NOTE — Therapy (Unsigned)
 OUTPATIENT PHYSICAL THERAPY SHOULDER TREATMENT   Patient Name: Andrea [name] MRN: [id number] DOB:Feb 16, 1951, 73 y.o., female Today's Date: 01/12/2024    PT End of Session - 01/12/24 1115     Visit Number 7    Number of Visits 12    Date for PT Re-Evaluation 01/26/24    PT Start Time 1115    PT Stop Time 1201    PT Time Calculation (min) 46 min    Activity Tolerance Patient tolerated treatment well;Patient limited by fatigue;Patient limited by pain    Behavior During Therapy [facility] for tasks assessed/performed                Past Medical History:  Diagnosis Date   Allergic rhinitis due to allergen     Allergy     Breast cancer (HCC) 2008    RT LUMPECTOMY   Breast cancer (HCC) 2011    RT MASTECTOMY   Cancer (HCC) 7991,7988      High-grade DCIS,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   Cervical post-laminectomy syndrome     Chronic constipation     Colon polyp 2010   Complication of anesthesia      itching real bad or msucles spasms   COPD (chronic obstructive pulmonary disease) (HCC) 02/21/2019   Cubital tunnel syndrome     Depression     Disorders of sacrum     Disturbance of skin sensation     Emphysema lung (HCC) 2020   GERD (gastroesophageal reflux disease)     Heart murmur     History of abnormal cervical Pap smear     History of cataract     History of colon polyps     History of pneumonia 2008   Hot flashes     Hx of diplopia      right eye   Hyperlipidemia     Hypertension     Insomnia     Late effects of acute poliomyelitis     Lateral epicondylitis  of elbow     Loss of memory     Menopausal state     Neck pain     Osteoporosis     Pain in joint of left shoulder     Parkinson's disease (HCC) 2011   Peripheral vascular disease (HCC) 2011    venous stasis    Personal history of radiation therapy     PONV (postoperative nausea and vomiting)     Post-polio syndrome     Radiation 2008    BREAST CA   Sleep apnea     Tremor      Unspecified musculoskeletal disorders and symptoms referable to neck      cervical/trapezius   Vitamin D  deficiency                    Past Surgical History:  Procedure Laterality Date   ABDOMINAL HYSTERECTOMY   1997   ANTERIOR CERVICAL DECOMP/DISCECTOMY FUSION N/A 02/01/2019    Procedure: Anterior Cervical Decompression Fusion Cervical three-four, Cervical four-five, hardware removal Cervical five-six;  Surgeon: Louis Shove, MD;  Location: [facility] OR;  Service: Neurosurgery;  Laterality: N/A;   BOWEL RESECTION N/A 04/12/2023    Procedure: SMALL BOWEL RESECTION;  Surgeon: Jordis Laneta FALCON, MD;  Location: [facility] ORS;  Service: General;  Laterality: N/A;   BREAST LUMPECTOMY Right 2008    4 cm area of DCIS, margins less than 1 mm. Treated with wide excision, whole  breast radiation   BREAST SURGERY Right 12/04/2009    Right simple mastectomy, sentinel node biopsy.   CARPAL TUNNEL RELEASE       COLONOSCOPY   2010,2015    Dr. Jinny, Dr. Viktoria   COLONOSCOPY       COLONOSCOPY N/A 06/18/2022    Procedure: COLONOSCOPY;  Surgeon: Onita Elspeth Sharper, DO;  Location: [facility] ENDOSCOPY;  Service: Gastroenterology;  Laterality: N/A;   COLONOSCOPY N/A 10/07/2022    Procedure: COLONOSCOPY;  Surgeon: Onita Elspeth Sharper, DO;  Location: [facility] ENDOSCOPY;  Service: Gastroenterology;  Laterality: N/A;   COLONOSCOPY WITH PROPOFOL  N/A 05/23/2017    Procedure: COLONOSCOPY WITH PROPOFOL ;  Surgeon: Viktoria Lamar DASEN, MD;  Location: [facility] ENDOSCOPY;  Service: Endoscopy;  Laterality: N/A;   ELBOW SURGERY Left 2011   ELBOW SURGERY Left 2015   ESOPHAGOGASTRODUODENOSCOPY       ESOPHAGOGASTRODUODENOSCOPY N/A 08/20/2021    Procedure: ESOPHAGOGASTRODUODENOSCOPY (EGD);  Surgeon: Onita Elspeth Sharper, DO;  Location: [facility] ENDOSCOPY;  Service: Gastroenterology;  Laterality: N/A;   FLEXIBLE SIGMOIDOSCOPY   07/19/2000   HIP SURGERY Right 06/19/2012   LAPAROTOMY N/A 04/12/2023    Procedure: EXPLORATORY LAPAROTOMY;  Surgeon: Jordis Laneta FALCON, MD;  Location: [facility] ORS;  Service: General;  Laterality: N/A;   LEG SURGERY Right 1958    Corrcetive surgery for polio   LEG SURGERY Left 1958    corrective surgery for polio   MASTECTOMY Right 2011    BREAST CA   rectocele/enterocelle repair and perinoplasty       SHOULDER ARTHROSCOPY W/ ROTATOR CUFF REPAIR Bilateral 2010,2012   SHOULDER ARTHROSCOPY WITH OPEN ROTATOR CUFF REPAIR Left 08/19/2015    Procedure: SHOULDER ARTHROSCOPY WITH  MINI OPEN ROTATOR CUFF REPAIR,SUBACROMIAL DECOMPRESSION, ARTHROSCOPIC BICEPS TENODESIS;  Surgeon: Franky Cranker, MD;  Location: [facility] ORS;  Service: Orthopedics;  Laterality: Left;   SHOULDER SURGERY Left 2014   SPINE SURGERY       TOE FUSION Left 1970    little toe fusion   TONSILLECTOMY       TUBAL LIGATION       two left foot surgeries:polio related                    Patient Active Problem List    Diagnosis Date Noted   Small bowel obstruction (HCC) 04/12/2023   SBO (small bowel obstruction) (HCC) 04/12/2023   Primary hypertension 07/28/2021   Insomnia 07/28/2021   History of right mastectomy 06/24/2021   Pneumonia 03/03/2021   Respiratory failure with hypoxia (HCC) 03/03/2021   COPD with acute exacerbation (HCC) 03/03/2021   Wheelchair dependence 10/09/2019   Cervical radiculitis 04/04/2019   Cervical spondylosis with myelopathy and radiculopathy 02/01/2019   B12 deficiency 07/29/2018   Osteoarthritis of hip 09/29/2017   Ataxia 04/15/2017   Loss of memory 04/15/2017   Cardiac murmur 03/04/2017   Hx of adenomatous colonic polyps 12/10/2016   Tremor 08/11/2016   Personal history of tobacco use, presenting hazards to health 06/13/2016   Falls frequently 08/05/2014   Personal history of malignant neoplasm of breast 07/26/2014   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 Muscle weakness.    Rationale for Evaluation and Treatment: Rehabilitation   ONSET DATE: Initial surgery was a left shoulder replacement in ~June 2022. Left Reverse Total Shoulder in ~ April 2024. Left Scapular repair in ~ Nov 2024. May 13th, 2025 was surgical date for left conversion of failed reverse total shoulder hemiarthroplasty and revision scapula.    SUBJECTIVE:                                                                                                                                                                                       SUBJECTIVE STATEMENT: Pt. presents to physical therapy with 3/10 left shoulder pain currently s/p left conversion of failed reverse shoulder to hemiarthroplasty and revision scapula dual plating open reduction and internal fixation on 09/13/23. Pt. States that her pain is primarily over the superior and anterior aspect of the left humeral head and also into the left pectoral region. Pt. States that her pain can increase to a 7/10 at it's worst if she is laying on it, reaching, pulling, or pushing objects such as reaching for objects in the kitchen, putting her shirts on, or taking care of her cat. Easing factors include rest, hydrocodone  (10 mg), and Lidocaine  ointment which can alleviate sharp, pulling symptoms to a 0/10 at its best. Pt. Has not received any home PT services since her surgery. Pt. Denies any falls  within the past 6 months or any recent changes to her PMH.  Hand dominance: Right   PERTINENT HISTORY: Left shoulder replacement in ~June 2022 Left Reverse Total Shoulder in ~ April 2024 Left Scapular repair in ~ Nov 2024  May 13th, 2025 was surgical date for left conversion of failed reverse total shoulder hemiarthroplasty and revision scapula.    PAIN:  Are you having pain? Yes: NPRS scale: 3/10 currently, 7/10 at worst, 0/10 at best Pain location: anterior shoulder and into pectoral region, superior aspect of humeral head  Pain description: sharp, pulling Aggravating factors: Reaching, pulling, pushing, putting on clothing Relieving factors: heat, hydrocodone  medication (10 mg)   PRECAUTIONS: Fall   RED FLAGS: None           WEIGHT BEARING RESTRICTIONS: No   FALLS:  Has patient fallen in last 6 months? No   LIVING ENVIRONMENT: Lives with: lives with their spouse Lives in: House/apartment Stairs: No Has following equipment  at home: Counselling psychologist, Wheelchair (power), and Ramped entry   OCCUPATION: Retired   PLOF: Independent with household mobility with device, Independent with community mobility with device, Independent with gait, Independent with transfers, Needs assistance with ADLs, and Needs assistance with homemaking   PATIENT GOALS:To increase muscle mass in L UE, to have the range of motion required to reach her hair and the cabinets in her kitchen, to become independent with cooking and cleaning tasks at home so she does not have to rely on her husband   NEXT MD VISIT: 02/02/24   OBJECTIVE:  Note: Objective measures were completed at Evaluation unless otherwise noted.   PATIENT SURVEYS:  QuickDash: 54.5%    COGNITION: Overall cognitive status: Within functional limits for tasks assessed                                  SENSATION: WFL   POSTURE: Forward head posture, rounded shoulders, excessive thoracic kyphosis    UPPER EXTREMITY ROM:     Active ROM Right eval Left eval  Shoulder flexion 126 deg.  62 deg.  Shoulder extension      Shoulder abduction  110 deg. 59 deg.  Shoulder adduction      Shoulder internal rotation  35 deg.  44 deg.  Shoulder external rotation  55 deg. 55 deg.  Elbow flexion      Elbow extension      Wrist flexion      Wrist extension      Wrist ulnar deviation      Wrist radial deviation      Wrist pronation      Wrist supination      (Blank rows = not tested)   Cervical Left Rotation: 30 deg. Cervical Right Rotation: 35 deg.  Passive Left Shoulder ER: 52 deg. Passive Left Shoulder IR: 55 deg. Passive Left Shoulder Flexion: 100 deg. Passive Left Shoulder Abduction: 95 deg.    UPPER EXTREMITY MMT:   MMT Right eval Left eval  Shoulder flexion 4  3-   Shoulder extension      Shoulder abduction  4- 3-   Shoulder adduction      Shoulder internal rotation 4-  3+  Shoulder external rotation  4-  3-  Middle trapezius      Lower trapezius      Elbow flexion  4+ 3   Elbow extension  4+  3*  Wrist flexion      Wrist extension      Wrist ulnar deviation      Wrist radial deviation      Wrist pronation      Wrist supination      Grip strength (lbs) 45.6# 38.8#   (Blank rows = not tested)     PALPATION:  Tenderness to superior and anterior aspect of humeral head. Significant atrophy noted in left periscapular region and around [facility] joint.  TREATMENT DATE: 01/12/2024   Subjective:   Pt. reports 4/10 left shoulder pain over the anterior aspect of the humerus prior to tx. session. Pt. Reports continued adherence to her HEP.  There.ex.: Supine and sidelying left shoulder AAROM with therapist assistance (flexion, scaption, abduction) 1x10   Supine rhythmic stabilization with shoulder flexed to 90 degrees, 4x30 seconds  Supine serratus punches with 1# DB,  2x10  Supine D2 Active flexion, 5x total  Supine left shoulder isometrics with elbow by side and therapist manual resistance (Flexion, extension) 2x10  Seated left shoulder isometrics against therapist (IR, ER, abduction), 2x10  Seated Nautilus Tricep Extensions, 2x8, 30#  Seated Nautilus exercises: scapular retractions, pulldowns, 2x20 each (verbal cueing to stop before the point of pain during shoulder flexion component of pulldowns), 30#  Standing ball circles against wall (cw and ccw, 3 sets to failure, ~40 seconds)  Manual tx.: PROM of left shoulder in supine (Flexion, abduction, ER, IR)     PATIENT EDUCATION: Education details: Anatomy involved, diagnosis, prognosis, POC, HEP Person educated: Patient Education method: Explanation, Demonstration, Verbal cues, and Handouts Education comprehension: verbalized understanding and returned demonstration   HOME EXERCISE PROGRAM: Access Code: [email or web address] URL: [email or web address] Date: 12/15/2023 Prepared by: Ozell Sero Exercises - Supine Shoulder Press AAROM in Abduction with Dowel - 2 x daily - 7 x weekly - 2 sets - 10 reps - Supine Shoulder Flexion Extension AAROM with Dowel - 2 x daily - 7 x weekly - 2 sets - 10 reps - Supine Shoulder External Rotation in 45 Degrees Abduction AAROM with Dowel - 2 x daily - 7 x weekly - 2 sets - 10 reps - Scapular Retraction with Resistance - 1 x daily - 4-5 x weekly - 2 sets - 15 reps - Shoulder External Rotation and Scapular Retraction with Resistance - 1 x daily - 4-5 x weekly - 2 sets - 15 reps - Seated Shoulder Abduction with Self-Anchored Resistance - 1 x daily - 4-5 x weekly - 2 sets - 15 reps    ASSESSMENT:   CLINICAL IMPRESSION: Focus of today's tx. was to increase endurance capacity of left shoulder and periscapular musculature.  Pt. Was progressed to completing standing ball circles against wall, seated Nautilus exercises, and supine serratus punches for increased reps/time on  this day which she was able to complete without an increase in pain but did note muscular fatigue with each, requiring a longer duration rest break in between each set to recover. Pt. With no pain noted and less difficulty observed with active flexion of left shoulder in gravity eliminated position of sidelying as compared to supine, seated, or standing positions. Pt. Did report mild pain increase in the anterior region of her left humerus during supine extension isometrics (with shoulder flexed to 90 deg.) against manual resistance of therapist but pain had been alleviated back to a 4/10 by the end of tx. Session.Pt. will continue to benefit from skilled therapeutic intervention in order to address current deficits and activity limitations.    OBJECTIVE IMPAIRMENTS: Abnormal gait, decreased endurance, decreased ROM, decreased strength, impaired UE functional use, and pain.    ACTIVITY LIMITATIONS: carrying, lifting, sleeping, transfers, bathing, dressing, reach over head, hygiene/grooming, and locomotion level   PARTICIPATION LIMITATIONS: meal prep, cleaning, laundry, and driving   PERSONAL FACTORS: 3+ comorbidities: post-polio syndrome, COPD, PD are also affecting patient's functional outcome.    REHAB POTENTIAL: Good   CLINICAL DECISION MAKING: Evolving/moderate complexity   EVALUATION COMPLEXITY: Moderate     GOALS:  Goals reviewed with patient? Yes   SHORT TERM GOALS: Target date: 01/05/24   Pt. will have less than a 5/10 left shoulder pain at its worst so that she is able to put on her shirt in the morning and take care of her cat with greater ease.  Baseline: 7/10 at it's worst 9/11: 7/10 at it's worst Goal status: Not met   2.  Pt. To increase active left shoulder external rotation to >50 deg. To improve ability to complete ADL/ dressing.   Baseline: see above. 9/11: see above Goal status: Not met   LONG TERM GOALS: Target date: 01/26/24   Pt. will total a <40% on the QuickDash  Disability Symptom score so that she self perceives a significant improvement in her daily household activities such as doing household chores.  Baseline: 54.5% Goal status: INITIAL   2.  Pt. will improve gross left UE strength to at least a 4/5 in order to improve ability to reach for and carry items in her kitchen in order to cook her own meals.  Baseline: Patient currently relies on husband for cooking meals and cannot raise UEs to reach cabinets.   Goal status: INITIAL   3.  Pt. to improve active left shoulder flexion and abduction to at least 90 degrees to improve ability for her to wash and do her hair.  Baseline:  See above Goal status: INITIAL   PLAN:   PT FREQUENCY: 2x/week   PT DURATION: 6 weeks   PLANNED INTERVENTIONS: 97110-Therapeutic exercises, 97530- Therapeutic activity, [id number]- Neuromuscular re-education, 97535- Self Care, and [id number]- Manual therapy   PLAN FOR NEXT SESSION: Progress endurance with AAROM (flexion/abduction) in seated position with less assistance from therapist as able.    Curtistine Bracket, SPT   Andrea [name], PT 01/12/2024

## 2024-01-15 LAB — GENECONNECT MOLECULAR SCREEN: Genetic Analysis Overall Interpretation: NEGATIVE

## 2024-01-16 ENCOUNTER — Ambulatory Visit: Admitting: Physical Therapy

## 2024-01-18 ENCOUNTER — Ambulatory Visit: Admitting: Physical Therapy

## 2024-01-18 ENCOUNTER — Encounter: Payer: Self-pay | Admitting: Physical Therapy

## 2024-01-18 DIAGNOSIS — M25512 Pain in left shoulder: Secondary | ICD-10-CM

## 2024-01-18 DIAGNOSIS — M6281 Muscle weakness (generalized): Secondary | ICD-10-CM

## 2024-01-18 DIAGNOSIS — M25611 Stiffness of right shoulder, not elsewhere classified: Secondary | ICD-10-CM

## 2024-01-18 DIAGNOSIS — Z96612 Presence of left artificial shoulder joint: Secondary | ICD-10-CM

## 2024-01-18 NOTE — Therapy (Signed)
 OUTPATIENT PHYSICAL THERAPY SHOULDER TREATMENT   Patient Name: Andrea [name] MRN: [id number] DOB:05-15-1950, 73 y.o., female Today's Date: 01/18/2024    PT End of Session - 01/18/24 1248     Visit Number 8    Number of Visits 12    Date for PT Re-Evaluation 01/26/24    PT Start Time 1251    PT Stop Time 1342    PT Time Calculation (min) 51 min    Activity Tolerance Patient tolerated treatment well;Patient limited by fatigue;No increased pain    Behavior During Therapy [facility] for tasks assessed/performed                Past Medical History:  Diagnosis Date   Allergic rhinitis due to allergen     Allergy     Breast cancer (HCC) 2008    RT LUMPECTOMY   Breast cancer (HCC) 2011    RT MASTECTOMY   Cancer (HCC) 7991,7988      High-grade DCIS,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   Cervical post-laminectomy syndrome     Chronic constipation     Colon polyp 2010   Complication of anesthesia      itching real bad or msucles spasms   COPD (chronic obstructive pulmonary disease) (HCC) 02/21/2019   Cubital tunnel syndrome     Depression     Disorders of sacrum     Disturbance of skin sensation     Emphysema lung (HCC) 2020   GERD (gastroesophageal reflux disease)     Heart murmur     History of abnormal cervical Pap smear     History of cataract     History of colon polyps     History of pneumonia 2008   Hot flashes     Hx of diplopia      right eye   Hyperlipidemia     Hypertension     Insomnia     Late effects of acute poliomyelitis     Lateral epicondylitis  of elbow     Loss of memory     Menopausal state     Neck pain     Osteoporosis     Pain in joint of left shoulder     Parkinson's disease (HCC) 2011   Peripheral vascular disease (HCC) 2011    venous stasis    Personal history of radiation therapy     PONV (postoperative nausea and vomiting)     Post-polio syndrome     Radiation 2008    BREAST CA   Sleep apnea     Tremor      Unspecified musculoskeletal disorders and symptoms referable to neck      cervical/trapezius   Vitamin D  deficiency                    Past Surgical History:  Procedure Laterality Date   ABDOMINAL HYSTERECTOMY   1997   ANTERIOR CERVICAL DECOMP/DISCECTOMY FUSION N/A 02/01/2019    Procedure: Anterior Cervical Decompression Fusion Cervical three-four, Cervical four-five, hardware removal Cervical five-six;  Surgeon: Louis Shove, MD;  Location: [facility];  Service: Neurosurgery;  Laterality: N/A;   BOWEL RESECTION N/A 04/12/2023    Procedure: SMALL BOWEL RESECTION;  Surgeon: Jordis Laneta FALCON, MD;  Location: [facility] ORS;  Service: General;  Laterality: N/A;   BREAST LUMPECTOMY Right 2008    4 cm area of DCIS, margins less than 1 mm. Treated with wide excision, whole breast  radiation   BREAST SURGERY Right 12/04/2009    Right simple mastectomy, sentinel node biopsy.   CARPAL TUNNEL RELEASE       COLONOSCOPY   2010,2015    Dr. Jinny, Dr. Viktoria   COLONOSCOPY       COLONOSCOPY N/A 06/18/2022    Procedure: COLONOSCOPY;  Surgeon: Onita Elspeth Sharper, DO;  Location: [facility] ENDOSCOPY;  Service: Gastroenterology;  Laterality: N/A;   COLONOSCOPY N/A 10/07/2022    Procedure: COLONOSCOPY;  Surgeon: Onita Elspeth Sharper, DO;  Location: [facility] ENDOSCOPY;  Service: Gastroenterology;  Laterality: N/A;   COLONOSCOPY WITH PROPOFOL  N/A 05/23/2017    Procedure: COLONOSCOPY WITH PROPOFOL ;  Surgeon: Viktoria Lamar DASEN, MD;  Location: [facility] ENDOSCOPY;  Service: Endoscopy;  Laterality: N/A;   ELBOW SURGERY Left 2011   ELBOW SURGERY Left 2015   ESOPHAGOGASTRODUODENOSCOPY       ESOPHAGOGASTRODUODENOSCOPY N/A 08/20/2021    Procedure: ESOPHAGOGASTRODUODENOSCOPY (EGD);  Surgeon: Onita Elspeth Sharper, DO;  Location: [facility] ENDOSCOPY;  Service: Gastroenterology;  Laterality: N/A;   FLEXIBLE SIGMOIDOSCOPY   07/19/2000   HIP SURGERY Right 06/19/2012   LAPAROTOMY N/A 04/12/2023    Procedure: EXPLORATORY LAPAROTOMY;  Surgeon: Jordis Laneta FALCON, MD;  Location: [facility] ORS;  Service: General;  Laterality: N/A;   LEG SURGERY Right 1958    Corrcetive surgery for polio   LEG SURGERY Left 1958    corrective surgery for polio   MASTECTOMY Right 2011    BREAST CA   rectocele/enterocelle repair and perinoplasty       SHOULDER ARTHROSCOPY W/ ROTATOR CUFF REPAIR Bilateral 2010,2012   SHOULDER ARTHROSCOPY WITH OPEN ROTATOR CUFF REPAIR Left 08/19/2015    Procedure: SHOULDER ARTHROSCOPY WITH  MINI OPEN ROTATOR CUFF REPAIR,SUBACROMIAL DECOMPRESSION, ARTHROSCOPIC BICEPS TENODESIS;  Surgeon: Franky Cranker, MD;  Location: [facility] ORS;  Service: Orthopedics;  Laterality: Left;   SHOULDER SURGERY Left 2014   SPINE SURGERY       TOE FUSION Left 1970    little toe fusion   TONSILLECTOMY       TUBAL LIGATION       two left foot surgeries:polio related                    Patient Active Problem List    Diagnosis Date Noted   Small bowel obstruction (HCC) 04/12/2023   SBO (small bowel obstruction) (HCC) 04/12/2023   Primary hypertension 07/28/2021   Insomnia 07/28/2021   History of right mastectomy 06/24/2021   Pneumonia 03/03/2021   Respiratory failure with hypoxia (HCC) 03/03/2021   COPD with acute exacerbation (HCC) 03/03/2021   Wheelchair dependence 10/09/2019   Cervical radiculitis 04/04/2019   Cervical spondylosis with myelopathy and radiculopathy 02/01/2019   B12 deficiency 07/29/2018   Osteoarthritis of hip 09/29/2017   Ataxia 04/15/2017   Loss of memory 04/15/2017   Cardiac murmur 03/04/2017   Hx of adenomatous colonic polyps 12/10/2016   Tremor 08/11/2016   Personal history of tobacco use, presenting hazards to health 06/13/2016   Falls frequently 08/05/2014   Personal history of malignant neoplasm of breast 07/26/2014   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   Lumbosacral spondylosis without myelopathy 04/30/2013   Subacute lumbar radiculopathy 04/30/2013   Chronic low back pain 02/19/2013   Post-polio syndrome  04/11/2012   Sacroiliac joint dysfunction of both sides 07/12/2011   Esophageal reflux 11/02/2010   Pure hypercholesterolemia 11/02/2010   Joint pain 11/02/2010   Mild vitamin D  deficiency 11/02/2010      PCP: Rudolpho Norleen BIRCH, MD  REFERRING PROVIDER: Carolee Lamar LABOR, MD   REFERRING DIAG:  S42.122G (ICD-10-CM) - Displaced fracture of acromial process, left shoulder, subsequent encounter for fracture with delayed healing  Z96.612 (ICD-10-CM) - Presence of left artificial shoulder joint      THERAPY DIAG: Left shoulder pain. Muscle weakness.    Rationale for Evaluation and Treatment: Rehabilitation   ONSET DATE: Initial surgery was a left shoulder replacement in ~June 2022. Left Reverse Total Shoulder in ~ April 2024. Left Scapular repair in ~ Nov 2024. May 13th, 2025 was surgical date for left conversion of failed reverse total shoulder hemiarthroplasty and revision scapula.    SUBJECTIVE:                                                                                                                                                                                       SUBJECTIVE STATEMENT: Pt. presents to physical therapy with 3/10 left shoulder pain currently s/p left conversion of failed reverse shoulder to hemiarthroplasty and revision scapula dual plating open reduction and internal fixation on 09/13/23. Pt. States that her pain is primarily over the superior and anterior aspect of the left humeral head and also into the left pectoral region. Pt. States that her pain can increase to a 7/10 at it's worst if she is laying on it, reaching, pulling, or pushing objects such as reaching for objects in the kitchen, putting her shirts on, or taking care of her cat. Easing factors include rest, hydrocodone  (10 mg), and Lidocaine  ointment which can alleviate sharp, pulling symptoms to a 0/10 at its best. Pt. Has not received any home PT services since her surgery. Pt. Denies any falls  within the past 6 months or any recent changes to her PMH.  Hand dominance: Right   PERTINENT HISTORY: Left shoulder replacement in ~June 2022 Left Reverse Total Shoulder in ~ April 2024 Left Scapular repair in ~ Nov 2024  May 13th, 2025 was surgical date for left conversion of failed reverse total shoulder hemiarthroplasty and revision scapula.    PAIN:  Are you having pain? Yes: NPRS scale: 3/10 currently, 7/10 at worst, 0/10 at best Pain location: anterior shoulder and into pectoral region, superior aspect of humeral head  Pain description: sharp, pulling Aggravating factors: Reaching, pulling, pushing, putting on clothing Relieving factors: heat, hydrocodone  medication (10 mg)   PRECAUTIONS: Fall   RED FLAGS: None           WEIGHT BEARING RESTRICTIONS: No   FALLS:  Has patient fallen in last 6 months? No   LIVING ENVIRONMENT: Lives with: lives with their spouse Lives in: House/apartment Stairs: No Has following equipment at home: [employer]  cane small base, Wheelchair (power), and Ramped entry   OCCUPATION: Retired   PLOF: Independent with household mobility with device, Independent with community mobility with device, Independent with gait, Independent with transfers, Needs assistance with ADLs, and Needs assistance with homemaking   PATIENT GOALS:To increase muscle mass in L UE, to have the range of motion required to reach her hair and the cabinets in her kitchen, to become independent with cooking and cleaning tasks at home so she does not have to rely on her husband   NEXT MD VISIT: 02/02/24   OBJECTIVE:  Note: Objective measures were completed at Evaluation unless otherwise noted.   PATIENT SURVEYS:  QuickDash: 54.5%    COGNITION: Overall cognitive status: Within functional limits for tasks assessed                                  SENSATION: WFL   POSTURE: Forward head posture, rounded shoulders, excessive thoracic kyphosis    UPPER EXTREMITY ROM:     Active ROM Right eval Left eval  Shoulder flexion 126 deg.  62 deg.  Shoulder extension      Shoulder abduction  110 deg. 59 deg.  Shoulder adduction      Shoulder internal rotation  35 deg.  44 deg.  Shoulder external rotation  55 deg. 55 deg.  Elbow flexion      Elbow extension      Wrist flexion      Wrist extension      Wrist ulnar deviation      Wrist radial deviation      Wrist pronation      Wrist supination      (Blank rows = not tested)   Cervical Left Rotation: 30 deg. Cervical Right Rotation: 35 deg.  Passive Left Shoulder ER: 52 deg. Passive Left Shoulder IR: 55 deg. Passive Left Shoulder Flexion: 100 deg. Passive Left Shoulder Abduction: 95 deg.    UPPER EXTREMITY MMT:   MMT Right eval Left eval  Shoulder flexion 4  3-   Shoulder extension      Shoulder abduction  4- 3-   Shoulder adduction      Shoulder internal rotation 4-  3+  Shoulder external rotation  4-  3-  Middle trapezius      Lower trapezius      Elbow flexion  4+ 3   Elbow extension  4+  3*  Wrist flexion      Wrist extension      Wrist ulnar deviation      Wrist radial deviation      Wrist pronation      Wrist supination      Grip strength (lbs) 45.6# 38.8#   (Blank rows = not tested)     PALPATION:  Tenderness to superior and anterior aspect of humeral head. Significant atrophy noted in left periscapular region and around [facility] joint.  TREATMENT DATE: 01/18/2024   Subjective:   Pt. reports ~5/10 left shoulder pain over the anterior aspect of the humerus prior to tx. session. Pt. Reports continued adherence to her HEP.  There.ex.: Supine and sidelying left shoulder AAROM with therapist assistance (flexion, scaption, abduction) 1x10   Supine rhythmic stabilization with shoulder flexed to 90 degrees, 4x30 seconds  Supine serratus punches with 2# DB,  2x10  Supine D2 Active flexion, 5x total  Supine left shoulder isometrics with elbow by side and therapist manual resistance (Flexion, extension) 2x10  Seated left shoulder isometrics against therapist (IR, ER, abduction), 2x10  Seated Nautilus Tricep Extensions, 2x8, 30#  Seated Nautilus exercises: scapular retractions, pulldowns, 2x20 each (verbal cueing to stop before the point of pain during shoulder flexion component of pulldowns), 30#  Standing ball circles against wall (cw and ccw, 3 sets to failure, ~40 seconds)  Sidelying left shoulder flexion (for AAROM in a gravity eliminated position)  Manual tx.: PROM of left shoulder in supine (Flexion, abduction, ER, IR)     PATIENT EDUCATION: Education details: Anatomy involved, diagnosis, prognosis, POC, HEP Person educated: Patient Education method: Explanation, Demonstration, Verbal cues, and Handouts Education comprehension: verbalized understanding and returned demonstration   HOME EXERCISE PROGRAM: Access Code: [email or web address] URL: [email or web address] Date: 12/15/2023 Prepared by: Ozell Sero Exercises - Supine Shoulder Press AAROM in Abduction with Dowel - 2 x daily - 7 x weekly - 2 sets - 10 reps - Supine Shoulder Flexion Extension AAROM with Dowel - 2 x daily - 7 x weekly - 2 sets - 10 reps - Supine Shoulder External Rotation in 45 Degrees Abduction AAROM with Dowel - 2 x daily - 7 x weekly - 2 sets - 10 reps - Scapular Retraction with Resistance - 1 x daily - 4-5 x weekly - 2 sets - 15 reps - Shoulder External Rotation and Scapular Retraction with Resistance - 1 x daily - 4-5 x weekly - 2 sets - 15 reps - Seated Shoulder Abduction with Self-Anchored Resistance - 1 x daily - 4-5 x weekly - 2 sets - 15 reps    ASSESSMENT:   CLINICAL IMPRESSION: Focus of today's tx. was to increase endurance capacity of left shoulder and periscapular musculature. Pt was able to complete supine shoulder rhythmic stabilization activity  and standing ball circles against wall for increased time on this day indicating an improvement in endurance capacity of left shoulder. Pt was progressed to completing scapular retractions for increased reps and completing supine serratus punches with increased weight on this day which she tolerated without an increase in pain. Pt. will continue to benefit from skilled therapeutic intervention in order to address current deficits and activity limitations.    OBJECTIVE IMPAIRMENTS: Abnormal gait, decreased endurance, decreased ROM, decreased strength, impaired UE functional use, and pain.    ACTIVITY LIMITATIONS: carrying, lifting, sleeping, transfers, bathing, dressing, reach over head, hygiene/grooming, and locomotion level   PARTICIPATION LIMITATIONS: meal prep, cleaning, laundry, and driving   PERSONAL FACTORS: 3+ comorbidities: post-polio syndrome, COPD, PD are also affecting patient's functional outcome.    REHAB POTENTIAL: Good   CLINICAL DECISION MAKING: Evolving/moderate complexity   EVALUATION COMPLEXITY: Moderate     GOALS: Goals reviewed with patient? Yes   SHORT TERM GOALS: Target date: 01/05/24   Pt. will have less than a 5/10 left shoulder pain at its worst so that she is able to put on her shirt in the morning and take care of her cat with greater ease.  Baseline: 7/10 at  it's worst 9/11: 7/10 at it's worst Goal status: Not met   2.  Pt. To increase active left shoulder external rotation to >50 deg. To improve ability to complete ADL/ dressing.   Baseline: see above. 9/11: see above Goal status: Not met   LONG TERM GOALS: Target date: 01/26/24   Pt. will total a <40% on the QuickDash Disability Symptom score so that she self perceives a significant improvement in her daily household activities such as doing household chores.  Baseline: 54.5% Goal status: INITIAL   2.  Pt. will improve gross left UE strength to at least a 4/5 in order to improve ability to reach for  and carry items in her kitchen in order to cook her own meals.  Baseline: Patient currently relies on husband for cooking meals and cannot raise UEs to reach cabinets.   Goal status: INITIAL   3.  Pt. to improve active left shoulder flexion and abduction to at least 90 degrees to improve ability for her to wash and do her hair.  Baseline:  See above Goal status: INITIAL   PLAN:   PT FREQUENCY: 2x/week   PT DURATION: 6 weeks   PLANNED INTERVENTIONS: 97110-Therapeutic exercises, 97530- Therapeutic activity, 97112- Neuromuscular re-education, 97535- Self Care, and [id number]- Manual therapy   PLAN FOR NEXT SESSION: Progress strengthening activities to tolerance (increased reps)   Curtistine Bracket, SPT   Ileene Ozell BROCKS, PT 01/18/2024

## 2024-01-19 ENCOUNTER — Encounter: Admitting: Physical Therapy

## 2024-01-24 ENCOUNTER — Ambulatory Visit: Admitting: Physical Therapy

## 2024-01-24 DIAGNOSIS — M25512 Pain in left shoulder: Secondary | ICD-10-CM

## 2024-01-24 DIAGNOSIS — M6281 Muscle weakness (generalized): Secondary | ICD-10-CM

## 2024-01-24 DIAGNOSIS — Z96612 Presence of left artificial shoulder joint: Secondary | ICD-10-CM

## 2024-01-24 DIAGNOSIS — M25611 Stiffness of right shoulder, not elsewhere classified: Secondary | ICD-10-CM

## 2024-01-24 NOTE — Therapy (Unsigned)
 OUTPATIENT PHYSICAL THERAPY SHOULDER TREATMENT   Patient Name: Andrea [name] MRN: [id number] DOB:[birth year], 73 y.o., female Today's Date: 01/24/2024    PT End of Session - 01/24/24 1105     Visit Number 9    Number of Visits 12    Date for Recertification  01/26/24    PT Start Time 1105    PT Stop Time 1157    PT Time Calculation (min) 52 min    Activity Tolerance Patient tolerated treatment well;Patient limited by fatigue;Patient limited by pain    Behavior During Therapy [facility] for tasks assessed/performed                Past Medical History:  Diagnosis Date   Allergic rhinitis due to allergen     Allergy     Breast cancer (HCC) 2008    RT LUMPECTOMY   Breast cancer (HCC) 2011    RT MASTECTOMY   Cancer (HCC) 7991,7988      High-grade DCIS,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   Cervical post-laminectomy syndrome     Chronic constipation     Colon polyp 2010   Complication of anesthesia      itching real bad or msucles spasms   COPD (chronic obstructive pulmonary disease) (HCC) 02/21/2019   Cubital tunnel syndrome     Depression     Disorders of sacrum     Disturbance of skin sensation     Emphysema lung (HCC) 2020   GERD (gastroesophageal reflux disease)     Heart murmur     History of abnormal cervical Pap smear     History of cataract     History of colon polyps     History of pneumonia 2008   Hot flashes     Hx of diplopia      right eye   Hyperlipidemia     Hypertension     Insomnia     Late effects of acute poliomyelitis     Lateral epicondylitis  of elbow     Loss of memory     Menopausal state     Neck pain     Osteoporosis     Pain in joint of left shoulder     Parkinson's disease (HCC) 2011   Peripheral vascular disease (HCC) 2011    venous stasis    Personal history of radiation therapy     PONV (postoperative nausea and vomiting)     Post-polio syndrome     Radiation 2008    BREAST CA   Sleep apnea     Tremor      Unspecified musculoskeletal disorders and symptoms referable to neck      cervical/trapezius   Vitamin D  deficiency                    Past Surgical History:  Procedure Laterality Date   ABDOMINAL HYSTERECTOMY   1997   ANTERIOR CERVICAL DECOMP/DISCECTOMY FUSION N/A 02/01/2019    Procedure: Anterior Cervical Decompression Fusion Cervical three-four, Cervical four-five, hardware removal Cervical five-six;  Surgeon: Louis Shove, MD;  Location: [facility] OR;  Service: Neurosurgery;  Laterality: N/A;   BOWEL RESECTION N/A 04/12/2023    Procedure: SMALL BOWEL RESECTION;  Surgeon: Jordis Laneta FALCON, MD;  Location: [facility] ORS;  Service: General;  Laterality: N/A;   BREAST LUMPECTOMY Right 2008    4 cm area of DCIS, margins less than 1 mm. Treated with wide excision, whole  breast radiation   BREAST SURGERY Right 12/04/2009    Right simple mastectomy, sentinel node biopsy.   CARPAL TUNNEL RELEASE       COLONOSCOPY   2010,2015    Dr. Jinny, Dr. Viktoria   COLONOSCOPY       COLONOSCOPY N/A 06/18/2022    Procedure: COLONOSCOPY;  Surgeon: Onita Elspeth Sharper, DO;  Location: [facility] ENDOSCOPY;  Service: Gastroenterology;  Laterality: N/A;   COLONOSCOPY N/A 10/07/2022    Procedure: COLONOSCOPY;  Surgeon: Onita Elspeth Sharper, DO;  Location: [facility] ENDOSCOPY;  Service: Gastroenterology;  Laterality: N/A;   COLONOSCOPY WITH PROPOFOL  N/A 05/23/2017    Procedure: COLONOSCOPY WITH PROPOFOL ;  Surgeon: Viktoria Lamar DASEN, MD;  Location: [facility] ENDOSCOPY;  Service: Endoscopy;  Laterality: N/A;   ELBOW SURGERY Left 2011   ELBOW SURGERY Left 2015   ESOPHAGOGASTRODUODENOSCOPY       ESOPHAGOGASTRODUODENOSCOPY N/A 08/20/2021    Procedure: ESOPHAGOGASTRODUODENOSCOPY (EGD);  Surgeon: Onita Elspeth Sharper, DO;  Location: [facility] ENDOSCOPY;  Service: Gastroenterology;  Laterality: N/A;   FLEXIBLE SIGMOIDOSCOPY   07/19/2000   HIP SURGERY Right 06/19/2012   LAPAROTOMY N/A 04/12/2023    Procedure: EXPLORATORY LAPAROTOMY;  Surgeon: Jordis Laneta FALCON, MD;  Location: [facility] ORS;  Service: General;  Laterality: N/A;   LEG SURGERY Right 1958    Corrcetive surgery for polio   LEG SURGERY Left 1958    corrective surgery for polio   MASTECTOMY Right 2011    BREAST CA   rectocele/enterocelle repair and perinoplasty       SHOULDER ARTHROSCOPY W/ ROTATOR CUFF REPAIR Bilateral 2010,2012   SHOULDER ARTHROSCOPY WITH OPEN ROTATOR CUFF REPAIR Left 08/19/2015    Procedure: SHOULDER ARTHROSCOPY WITH  MINI OPEN ROTATOR CUFF REPAIR,SUBACROMIAL DECOMPRESSION, ARTHROSCOPIC BICEPS TENODESIS;  Surgeon: Franky Cranker, MD;  Location: [facility] ORS;  Service: Orthopedics;  Laterality: Left;   SHOULDER SURGERY Left 2014   SPINE SURGERY       TOE FUSION Left 1970    little toe fusion   TONSILLECTOMY       TUBAL LIGATION       two left foot surgeries:polio related                    Patient Active Problem List    Diagnosis Date Noted   Small bowel obstruction (HCC) 04/12/2023   SBO (small bowel obstruction) (HCC) 04/12/2023   Primary hypertension 07/28/2021   Insomnia 07/28/2021   History of right mastectomy 06/24/2021   Pneumonia 03/03/2021   Respiratory failure with hypoxia (HCC) 03/03/2021   COPD with acute exacerbation (HCC) 03/03/2021   Wheelchair dependence 10/09/2019   Cervical radiculitis 04/04/2019   Cervical spondylosis with myelopathy and radiculopathy 02/01/2019   B12 deficiency 07/29/2018   Osteoarthritis of hip 09/29/2017   Ataxia 04/15/2017   Loss of memory 04/15/2017   Cardiac murmur 03/04/2017   Hx of adenomatous colonic polyps 12/10/2016   Tremor 08/11/2016   Personal history of tobacco use, presenting hazards to health 06/13/2016   Falls frequently 08/05/2014   Personal history of malignant neoplasm of breast 07/26/2014   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 Muscle weakness.    Rationale for Evaluation and Treatment: Rehabilitation   ONSET DATE: Initial surgery was a left shoulder replacement in ~June 2022. Left Reverse Total Shoulder in ~ April 2024. Left Scapular repair in ~ Nov 2024. May 13th, 2025 was surgical date for left conversion of failed reverse total shoulder hemiarthroplasty and revision scapula.    SUBJECTIVE:                                                                                                                                                                                       SUBJECTIVE STATEMENT: Pt. presents to physical therapy with 3/10 left shoulder pain currently s/p left conversion of failed reverse shoulder to hemiarthroplasty and revision scapula dual plating open reduction and internal fixation on 09/13/23. Pt. States that her pain is primarily over the superior and anterior aspect of the left humeral head and also into the left pectoral region. Pt. States that her pain can increase to a 7/10 at it's worst if she is laying on it, reaching, pulling, or pushing objects such as reaching for objects in the kitchen, putting her shirts on, or taking care of her cat. Easing factors include rest, hydrocodone  (10 mg), and Lidocaine  ointment which can alleviate sharp, pulling symptoms to a 0/10 at its best. Pt. Has not received any home PT services since her surgery. Pt. Denies any falls  within the past 6 months or any recent changes to her PMH.  Hand dominance: Right   PERTINENT HISTORY: Left shoulder replacement in ~June 2022 Left Reverse Total Shoulder in ~ April 2024 Left Scapular repair in ~ Nov 2024  May 13th, 2025 was surgical date for left conversion of failed reverse total shoulder hemiarthroplasty and revision scapula.    PAIN:  Are you having pain? Yes: NPRS scale: 3/10 currently, 7/10 at worst, 0/10 at best Pain location: anterior shoulder and into pectoral region, superior aspect of humeral head  Pain description: sharp, pulling Aggravating factors: Reaching, pulling, pushing, putting on clothing Relieving factors: heat, hydrocodone  medication (10 mg)   PRECAUTIONS: Fall   RED FLAGS: None           WEIGHT BEARING RESTRICTIONS: No   FALLS:  Has patient fallen in last 6 months? No   LIVING ENVIRONMENT: Lives with: lives with their spouse Lives in: House/apartment Stairs: No Has following equipment  at home: Counselling psychologist, Wheelchair (power), and Ramped entry   OCCUPATION: Retired   PLOF: Independent with household mobility with device, Independent with community mobility with device, Independent with gait, Independent with transfers, Needs assistance with ADLs, and Needs assistance with homemaking   PATIENT GOALS:To increase muscle mass in L UE, to have the range of motion required to reach her hair and the cabinets in her kitchen, to become independent with cooking and cleaning tasks at home so she does not have to rely on her husband   NEXT MD VISIT: 02/02/24   OBJECTIVE:  Note: Objective measures were completed at Evaluation unless otherwise noted.   PATIENT SURVEYS:  QuickDash: 54.5%    COGNITION: Overall cognitive status: Within functional limits for tasks assessed                                  SENSATION: WFL   POSTURE: Forward head posture, rounded shoulders, excessive thoracic kyphosis    UPPER EXTREMITY ROM:     Active ROM Right eval Left eval  Shoulder flexion 126 deg.  62 deg.  Shoulder extension      Shoulder abduction  110 deg. 59 deg.  Shoulder adduction      Shoulder internal rotation  35 deg.  44 deg.  Shoulder external rotation  55 deg. 55 deg.  Elbow flexion      Elbow extension      Wrist flexion      Wrist extension      Wrist ulnar deviation      Wrist radial deviation      Wrist pronation      Wrist supination      (Blank rows = not tested)   Cervical Left Rotation: 30 deg. Cervical Right Rotation: 35 deg.  Passive Left Shoulder ER: 52 deg. Passive Left Shoulder IR: 55 deg. Passive Left Shoulder Flexion: 100 deg. Passive Left Shoulder Abduction: 95 deg.    UPPER EXTREMITY MMT:   MMT Right eval Left eval  Shoulder flexion 4  3-   Shoulder extension      Shoulder abduction  4- 3-   Shoulder adduction      Shoulder internal rotation 4-  3+  Shoulder external rotation  4-  3-  Middle trapezius      Lower trapezius      Elbow flexion  4+ 3   Elbow extension  4+  3*  Wrist flexion      Wrist extension      Wrist ulnar deviation      Wrist radial deviation      Wrist pronation      Wrist supination      Grip strength (lbs) 45.6# 38.8#   (Blank rows = not tested)     PALPATION:  Tenderness to superior and anterior aspect of humeral head. Significant atrophy noted in left periscapular region and around [facility] joint.  TREATMENT DATE: 01/24/2024   Subjective:   Pt. reports 2/10 left shoulder pain over the anterior aspect of the humerus prior to tx. session. Pt. States that she was in a lot of pain over the weekend including experiencing excruciating 10/10 pain after a subtle reaching motion involving pronation of her left UE while at lunch on Sunday that brought me to tears.   There.ex.: Supine and sidelying left shoulder AAROM  with therapist assistance (flexion, scaption, abduction) 1x10   Supine rhythmic stabilization with shoulder flexed to 90 degrees, 4x30 seconds  Supine serratus punches with 2# DB, 2x8  Supine D2 Active flexion, 10x total  Supine left shoulder isometrics with elbow by side and therapist manual resistance (Flexion, extension) 2x10  Seated left shoulder isometrics against therapist (IR, ER, abduction), 2x12  Seated Nautilus Tricep Extensions, 2x8, 30#  Seated Nautilus Chest Press, 2x10 10#  Seated Nautilus exercises: scapular retractions, pulldowns, 2x20 each (verbal cueing to stop before the point of pain during shoulder flexion component of pulldowns), 30#  Sidelying left shoulder flexion (for AAROM in a gravity eliminated position), 2x10  Manual tx.: PROM of left shoulder in supine (Flexion, abduction, ER, IR)     PATIENT EDUCATION: Education details: Anatomy involved, diagnosis, prognosis, POC, HEP Person educated: Patient Education method: Explanation, Demonstration, Verbal cues, and Handouts Education comprehension: verbalized understanding and returned demonstration   HOME EXERCISE PROGRAM: Access Code: [email or web address] URL: [email or web address] Date: 12/15/2023 Prepared by: Ozell Sero Exercises - Supine Shoulder Press AAROM in Abduction with Dowel - 2 x daily - 7 x weekly - 2 sets - 10 reps - Supine Shoulder Flexion Extension AAROM with Dowel - 2 x daily - 7 x weekly - 2 sets - 10 reps - Supine Shoulder External Rotation in 45 Degrees Abduction AAROM with Dowel - 2 x daily - 7 x weekly - 2 sets - 10 reps - Scapular Retraction with Resistance - 1 x daily - 4-5 x weekly - 2 sets - 15 reps - Shoulder External Rotation and Scapular Retraction with Resistance - 1 x daily - 4-5 x weekly - 2 sets - 15 reps - Seated Shoulder Abduction with Self-Anchored Resistance - 1 x daily - 4-5 x weekly - 2 sets - 15 reps    ASSESSMENT:   CLINICAL IMPRESSION: Pt was introduced to new  exercise on this day which was seated Nautilus chest press exercise which she was able to complete without significant difficulty or pain. Pt was progressed to completing supine D2 shoulder pattern, sidelying shoulder AAROM with assistance from therapist, and seated shoulder isometrics for increased reps on this day. Pt continues to experience difficulty with performing isometric abduction/ER exercise and with actively elevating left UE to 90 degrees without increase in pain. Pt reported an increase in pain left shoulder pain to a 5/10 at end of treatment session. Will continue to monitor pt   OBJECTIVE IMPAIRMENTS: Abnormal gait, decreased endurance, decreased ROM, decreased strength, impaired UE functional use, and pain.    ACTIVITY LIMITATIONS: carrying, lifting, sleeping, transfers, bathing, dressing, reach over head, hygiene/grooming, and locomotion level   PARTICIPATION LIMITATIONS: meal prep, cleaning, laundry, and driving   PERSONAL FACTORS: 3+ comorbidities: post-polio syndrome, COPD, PD are also affecting patient's functional outcome.    REHAB POTENTIAL: Good   CLINICAL DECISION MAKING: Evolving/moderate complexity   EVALUATION COMPLEXITY: Moderate     GOALS: Goals reviewed with patient? Yes   SHORT TERM GOALS: Target date: 01/05/24   Pt. will have less than a 5/10 left shoulder  pain at its worst so that she is able to put on her shirt in the morning and take care of her cat with greater ease.  Baseline: 7/10 at it's worst 9/11: 7/10 at it's worst Goal status: Not met   2.  Pt. To increase active left shoulder external rotation to >50 deg. To improve ability to complete ADL/ dressing.   Baseline: see above. 9/11: see above Goal status: Not met   LONG TERM GOALS: Target date: 01/26/24   Pt. will total a <40% on the QuickDash Disability Symptom score so that she self perceives a significant improvement in her daily household activities such as doing household chores.   Baseline: 54.5% Goal status: INITIAL   2.  Pt. will improve gross left UE strength to at least a 4/5 in order to improve ability to reach for and carry items in her kitchen in order to cook her own meals.  Baseline: Patient currently relies on husband for cooking meals and cannot raise UEs to reach cabinets.   Goal status: INITIAL   3.  Pt. to improve active left shoulder flexion and abduction to at least 90 degrees to improve ability for her to wash and do her hair.  Baseline:  See above Goal status: INITIAL   PLAN:   PT FREQUENCY: 2x/week   PT DURATION: 6 weeks   PLANNED INTERVENTIONS: 97110-Therapeutic exercises, 97530- Therapeutic activity, 97112- Neuromuscular re-education, 97535- Self Care, and [id number]- Manual therapy   PLAN FOR NEXT SESSION: Progress strengthening activities to tolerance including sidelying active flexion with 1#DB next session if pain levels are low and motion can be completed without assistance from therapist.   Curtistine Bracket, SPT   Ileene Ozell BROCKS, PT 01/24/2024

## 2024-01-25 ENCOUNTER — Encounter: Payer: Self-pay | Admitting: Physical Therapy

## 2024-01-26 ENCOUNTER — Ambulatory Visit: Admitting: Physical Therapy

## 2024-01-26 ENCOUNTER — Encounter: Payer: Self-pay | Admitting: Physical Therapy

## 2024-01-26 DIAGNOSIS — M6281 Muscle weakness (generalized): Secondary | ICD-10-CM

## 2024-01-26 DIAGNOSIS — M25611 Stiffness of right shoulder, not elsewhere classified: Secondary | ICD-10-CM

## 2024-01-26 DIAGNOSIS — Z96612 Presence of left artificial shoulder joint: Secondary | ICD-10-CM

## 2024-01-26 DIAGNOSIS — M25512 Pain in left shoulder: Secondary | ICD-10-CM | POA: Diagnosis not present

## 2024-01-26 NOTE — Progress Notes (Unsigned)
 Expand All Collapse All   OUTPATIENT PHYSICAL THERAPY SHOULDER TREATMENT Physical Therapy Progress Note  Dates of reporting period  12/15/23   to   01/26/24    Patient Name: Andrea [name] MRN: [id number] DOB:1950-11-17, 73 y.o., female Today's Date: 01/27/2024    PT End of Session - 01/26/24 1508     Visit Number 10    Number of Visits 12    Date for Recertification  01/26/24    PT Start Time 1115    PT Stop Time 1200    PT Time Calculation (min) 45 min    Activity Tolerance Patient tolerated treatment well;Patient limited by fatigue;Patient limited by pain    Behavior During Therapy [facility] for tasks assessed/performed                 Past Medical History:  Diagnosis Date   Allergic rhinitis due to allergen     Allergy     Breast cancer (HCC) 2008    RT LUMPECTOMY   Breast cancer (HCC) 2011    RT MASTECTOMY   Cancer (HCC) 7991,7988      High-grade DCIS,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   Cervical post-laminectomy syndrome     Chronic constipation     Colon polyp 2010   Complication of anesthesia      itching real bad or msucles spasms   COPD (chronic obstructive pulmonary disease) (HCC) 02/21/2019   Cubital tunnel syndrome     Depression     Disorders of sacrum     Disturbance of skin sensation     Emphysema lung (HCC) 2020   GERD (gastroesophageal reflux disease)     Heart murmur     History of abnormal cervical Pap smear     History of cataract     History of colon polyps     History of pneumonia 2008   Hot flashes     Hx of diplopia      right eye   Hyperlipidemia     Hypertension     Insomnia     Late effects of acute poliomyelitis     Lateral epicondylitis  of elbow     Loss of memory     Menopausal state     Neck pain     Osteoporosis     Pain in joint of left shoulder     Parkinson's disease (HCC) 2011   Peripheral vascular disease (HCC) 2011    venous stasis    Personal history of radiation therapy     PONV (postoperative nausea and  vomiting)     Post-polio syndrome     Radiation 2008    BREAST CA   Sleep apnea     Tremor     Unspecified musculoskeletal disorders and symptoms referable to neck      cervical/trapezius   Vitamin D  deficiency                    Past Surgical History:  Procedure Laterality Date   ABDOMINAL HYSTERECTOMY   1997   ANTERIOR CERVICAL DECOMP/DISCECTOMY FUSION N/A 02/01/2019    Procedure: Anterior Cervical Decompression Fusion Cervical three-four, Cervical four-five, hardware removal Cervical five-six;  Surgeon: Louis Shove, MD;  Location: [facility] OR;  Service: Neurosurgery;  Laterality: N/A;   BOWEL RESECTION N/A 04/12/2023    Procedure: SMALL BOWEL RESECTION;  Surgeon: Jordis Laneta FALCON, MD;  Location: [facility] ORS;  Service: General;  Laterality: N/A;   BREAST LUMPECTOMY Right 2008  4 cm area of DCIS, margins less than 1 mm. Treated with wide excision, whole breast radiation   BREAST SURGERY Right 12/04/2009    Right simple mastectomy, sentinel node biopsy.   CARPAL TUNNEL RELEASE       COLONOSCOPY   2010,2015    Dr. Jinny, Dr. Viktoria   COLONOSCOPY       COLONOSCOPY N/A 06/18/2022    Procedure: COLONOSCOPY;  Surgeon: Onita Elspeth Sharper, DO;  Location: [facility] ENDOSCOPY;  Service: Gastroenterology;  Laterality: N/A;   COLONOSCOPY N/A 10/07/2022    Procedure: COLONOSCOPY;  Surgeon: Onita Elspeth Sharper, DO;  Location: [facility] ENDOSCOPY;  Service: Gastroenterology;  Laterality: N/A;   COLONOSCOPY WITH PROPOFOL  N/A 05/23/2017    Procedure: COLONOSCOPY WITH PROPOFOL ;  Surgeon: Viktoria Lamar DASEN, MD;  Location: [facility] ENDOSCOPY;  Service: Endoscopy;  Laterality: N/A;   ELBOW SURGERY Left 2011   ELBOW SURGERY Left 2015   ESOPHAGOGASTRODUODENOSCOPY       ESOPHAGOGASTRODUODENOSCOPY N/A 08/20/2021    Procedure: ESOPHAGOGASTRODUODENOSCOPY (EGD);  Surgeon: Onita Elspeth Sharper, DO;  Location: [facility] ENDOSCOPY;  Service: Gastroenterology;  Laterality: N/A;   FLEXIBLE SIGMOIDOSCOPY   07/19/2000   HIP SURGERY  Right 06/19/2012   LAPAROTOMY N/A 04/12/2023    Procedure: EXPLORATORY LAPAROTOMY;  Surgeon: Jordis Laneta FALCON, MD;  Location: [facility] ORS;  Service: General;  Laterality: N/A;   LEG SURGERY Right 1958    Corrcetive surgery for polio   LEG SURGERY Left 1958    corrective surgery for polio   MASTECTOMY Right 2011    BREAST CA   rectocele/enterocelle repair and perinoplasty       SHOULDER ARTHROSCOPY W/ ROTATOR CUFF REPAIR Bilateral 2010,2012   SHOULDER ARTHROSCOPY WITH OPEN ROTATOR CUFF REPAIR Left 08/19/2015    Procedure: SHOULDER ARTHROSCOPY WITH  MINI OPEN ROTATOR CUFF REPAIR,SUBACROMIAL DECOMPRESSION, ARTHROSCOPIC BICEPS TENODESIS;  Surgeon: Franky Cranker, MD;  Location: [facility] ORS;  Service: Orthopedics;  Laterality: Left;   SHOULDER SURGERY Left 2014   SPINE SURGERY       TOE FUSION Left 1970    little toe fusion   TONSILLECTOMY       TUBAL LIGATION       two left foot surgeries:polio related                    Patient Active Problem List    Diagnosis Date Noted   Small bowel obstruction (HCC) 04/12/2023   SBO (small bowel obstruction) (HCC) 04/12/2023   Primary hypertension 07/28/2021   Insomnia 07/28/2021   History of right mastectomy 06/24/2021   Pneumonia 03/03/2021   Respiratory failure with hypoxia (HCC) 03/03/2021   COPD with acute exacerbation (HCC) 03/03/2021   Wheelchair dependence 10/09/2019   Cervical radiculitis 04/04/2019   Cervical spondylosis with myelopathy and radiculopathy 02/01/2019   B12 deficiency 07/29/2018   Osteoarthritis of hip 09/29/2017   Ataxia 04/15/2017   Loss of memory 04/15/2017   Cardiac murmur 03/04/2017   Hx of adenomatous colonic polyps 12/10/2016   Tremor 08/11/2016   Personal history of tobacco use, presenting hazards to health 06/13/2016   Falls frequently 08/05/2014   Personal history of malignant neoplasm of breast 07/26/2014   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   Lumbosacral spondylosis without myelopathy 04/30/2013    Subacute lumbar radiculopathy 04/30/2013   Chronic low back pain 02/19/2013   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 Muscle weakness.    Rationale for Evaluation and Treatment: Rehabilitation   ONSET DATE: Initial surgery was a left shoulder replacement in ~June 2022. Left Reverse Total Shoulder in ~ April 2024. Left Scapular repair in ~ Nov 2024. May 13th, 2025 was surgical date for left conversion of failed reverse total shoulder hemiarthroplasty and revision scapula.    SUBJECTIVE:                                                                                                                                                                                       SUBJECTIVE STATEMENT: Pt. presents to physical therapy with 3/10 left shoulder pain currently s/p left conversion of failed reverse shoulder to hemiarthroplasty and revision scapula dual plating open reduction and internal fixation on 09/13/23. Pt. States that her pain is primarily over the superior and anterior aspect of the left humeral head and also into the left pectoral region. Pt. States that her pain can increase to a 7/10 at it's worst if she is laying on it, reaching, pulling, or pushing objects such as reaching for objects in the kitchen, putting her shirts on, or taking care of her cat. Easing factors include rest, hydrocodone  (10 mg), and Lidocaine  ointment which can alleviate sharp, pulling symptoms to a 0/10  at its best. Pt. Has not received any home PT services since her surgery. Pt. Denies any falls within the past 6 months or any recent changes to her PMH.  Hand dominance: Right   PERTINENT HISTORY: Left shoulder replacement in ~June 2022 Left Reverse Total Shoulder in ~ April 2024 Left Scapular repair in ~ Nov 2024  May 13th, 2025 was surgical date for left conversion of failed reverse total shoulder hemiarthroplasty and revision scapula.    PAIN:  Are you having pain? Yes: NPRS scale: 3/10 currently, 7/10 at worst, 0/10 at best Pain location: anterior shoulder and into pectoral region, superior aspect of humeral head  Pain description: sharp, pulling Aggravating factors: Reaching, pulling, pushing, putting on clothing Relieving factors: heat, hydrocodone  medication (10 mg)   PRECAUTIONS: Fall   RED FLAGS: None           WEIGHT BEARING RESTRICTIONS: No   FALLS:  Has patient fallen in last 6 months? No   LIVING ENVIRONMENT:  Lives with: lives with their spouse Lives in: House/apartment Stairs: No Has following equipment at home: Counselling psychologist, Wheelchair (power), and Ramped entry   OCCUPATION: Retired   PLOF: Independent with household mobility with device, Independent with community mobility with device, Independent with gait, Independent with transfers, Needs assistance with ADLs, and Needs assistance with homemaking   PATIENT GOALS:To increase muscle mass in L UE, to have the range of motion required to reach her hair and the cabinets in her kitchen, to become independent with cooking and cleaning tasks at home so she does not have to rely on her husband   NEXT MD VISIT: 02/02/24   OBJECTIVE:  Note: Objective measures were completed at Evaluation unless otherwise noted.   PATIENT SURVEYS:  QuickDash: 54.5%    COGNITION: Overall cognitive status: Within functional limits for tasks assessed                                  SENSATION: WFL   POSTURE: Forward  head posture, rounded shoulders, excessive thoracic kyphosis    UPPER EXTREMITY ROM:    Active ROM Right eval Left eval  Shoulder flexion 126 deg.  62 deg.  Shoulder extension      Shoulder abduction  110 deg. 59 deg.  Shoulder adduction      Shoulder internal rotation  35 deg.  44 deg.  Shoulder external rotation  55 deg. 55 deg.  Elbow flexion      Elbow extension      Wrist flexion      Wrist extension      Wrist ulnar deviation      Wrist radial deviation      Wrist pronation      Wrist supination      (Blank rows = not tested)   Cervical Left Rotation: 30 deg. Cervical Right Rotation: 35 deg.  Passive Left Shoulder ER: 52 deg. Passive Left Shoulder IR: 55 deg. Passive Left Shoulder Flexion: 100 deg. Passive Left Shoulder Abduction: 95 deg.    UPPER EXTREMITY MMT:   MMT Right eval Left eval  Shoulder flexion 4  3-   Shoulder extension      Shoulder abduction  4- 3-   Shoulder adduction      Shoulder internal rotation 4-  3+  Shoulder external rotation  4-  3-  Middle trapezius      Lower trapezius      Elbow flexion  4+ 3   Elbow extension  4+  3*  Wrist flexion      Wrist extension      Wrist ulnar deviation      Wrist radial deviation      Wrist pronation      Wrist supination      Grip strength (lbs) 45.6# 38.8#   (Blank rows = not tested)    PALPATION:  Tenderness to superior and anterior aspect of humeral head. Significant atrophy noted in left periscapular region and around [facility] joint.  TREATMENT DATE: 01/26/2024  Subjective:   Pt. reports 5/10 left shoulder pain over the anterior aspect of the humerus at rest prior to tx. session. Pt. States that she has an appointment with her referring provider on Monday 01/30/24.    There.ex.: Supine and seated left shoulder AAROM and AROM with therapist assistance (flexion,  scaption, abduction) 1x10  Sidelying left shoulder flexion 10x, 1# dumbbell   Supine left shoulder isometrics with left shoulder at 90 degrees of flexion (Flexion, extension) 2x10  Supine left shoulder rhythmic stabilizations to 90 degrees of flexion, 3x30 seconds  Supine serratus punches, 2x10, 2# dumbbell  Supine D2 left shoulder flexion, 1# dumbbell   Seated left shoulder isometrics against therapist (IR, ER, abduction), 2x10   Seated Nautilus exercises: scapular retractions, Lat pulldowns, trciep extensions, 2x10 each  Seated bicep curls, 2x10, 4# dumbbell   Manual tx.: PROM of left shoulder in supine (Flexion, abduction, ER, IR)      PATIENT EDUCATION: Education details: Anatomy involved, diagnosis, prognosis, POC, HEP Person educated: Patient Education method: Explanation, Demonstration, Verbal cues, and Handouts Education comprehension: verbalized understanding and returned demonstration   HOME EXERCISE PROGRAM: Access Code: [email or web address] URL: [email or web address] Date: 12/15/2023 Prepared by: Ozell Sero Exercises - Supine Shoulder Press AAROM in Abduction with Dowel - 2 x daily - 7 x weekly - 2 sets - 10 reps - Supine Shoulder Flexion Extension AAROM with Dowel - 2 x daily - 7 x weekly - 2 sets - 10 reps - Supine Shoulder External Rotation in 45 Degrees Abduction AAROM with Dowel - 2 x daily - 7 x weekly - 2 sets - 10 reps - Scapular Retraction with Resistance - 1 x daily - 4-5 x weekly - 2 sets - 15 reps - Shoulder External Rotation and Scapular Retraction with Resistance - 1 x daily - 4-5 x weekly - 2 sets - 15 reps - Seated Shoulder Abduction with Self-Anchored Resistance - 1 x daily - 4-5 x weekly - 2 sets - 15 reps    ASSESSMENT:   CLINICAL IMPRESSION: Pt continues to experience 5/10 left shoulder pain that is constant and un improving. Pt with improved ability to complete sidelying left shoulder flexion on this day and was therefore given 1# dumbbell for  increased resistance which she was able to do without significant difficulty. Pt was progressed to completing supine serratus punches, seated bicep curls, and left shoulder D2 flexion with increased resistance on this day which she was able to complete without significant difficulty or an increase in NPS.  Pt has attended and actively participated in 10 physical therapy visits to date and has seen improvements in her gross LUE strength and endurance and left shoulder mobility. Pt continues to be limited by LUE strength and endurance, left shoulder pain, LUE mobility, and self perceived disability which limits her ability to complete household tasks such as lifting objects, washing her hair, or dressing without an increase in pain. Pt will continue to benefit from skilled physical therapy intervention in order to address limitations listed above and to maximize functional outcomes.    OBJECTIVE IMPAIRMENTS: Abnormal gait, decreased endurance, decreased ROM, decreased strength, impaired UE functional use, and pain.    ACTIVITY LIMITATIONS: carrying, lifting, sleeping, transfers, bathing, dressing, reach over head, hygiene/grooming, and locomotion level   PARTICIPATION LIMITATIONS: meal prep, cleaning, laundry, and driving   PERSONAL FACTORS: 3+ comorbidities: post-polio syndrome, COPD, PD are also affecting patient's functional outcome.    REHAB POTENTIAL: Good   CLINICAL DECISION MAKING: Evolving/moderate  complexity   EVALUATION COMPLEXITY: Moderate     GOALS: Goals reviewed with patient? Yes   SHORT TERM GOALS: Target date: 01/05/24   Pt. will have less than a 5/10 left shoulder pain at its worst so that she is able to put on her shirt in the morning and take care of her cat with greater ease.  Baseline: 7/10 at it's worst Goal status: Not met   2.  Pt. To increase active left shoulder external rotation to >50 deg. To improve ability to complete ADL/ dressing.   Baseline: see above Goal  status: On-going   LONG TERM GOALS: Target date: 01/26/24   Pt. will total a <40% on the QuickDash Disability Symptom score so that she self perceives a significant improvement in her daily household activities such as doing household chores.  Baseline: 54.5% Goal status: INITIAL   2.  Pt. will improve gross left UE strength to at least a 4/5 in order to improve ability to reach for and carry items in her kitchen in order to cook her own meals.  Baseline: Patient currently relies on husband for cooking meals and cannot raise UEs to reach cabinets.   Goal status: INITIAL   3.  Pt. to improve active left shoulder flexion and abduction to at least 90 degrees to improve ability for her to wash and do her hair.  Baseline:  See above Goal status: INITIAL   PLAN:   PT FREQUENCY: 2x/week   PT DURATION: 6 weeks   PLANNED INTERVENTIONS: 97110-Therapeutic exercises, 97530- Therapeutic activity, [id number]- Neuromuscular re-education, 97535- Self Care, and [id number]- Manual therapy   PLAN FOR NEXT SESSION: Continue general mobility/periscapular strengthening exercises to endurance as tolerable (Supine D2 flexion with RTB for resistance throughout ROM). CHECK GOALS/RECERT next tx. After MD f/u on 01/30/24   Curtistine Bracket, SPT   Ileene Ozell BROCKS, PT 01/26/2024

## 2024-01-30 ENCOUNTER — Ambulatory Visit: Admitting: Physical Therapy

## 2024-01-30 ENCOUNTER — Encounter: Payer: Self-pay | Admitting: Physical Therapy

## 2024-01-30 DIAGNOSIS — M25611 Stiffness of right shoulder, not elsewhere classified: Secondary | ICD-10-CM

## 2024-01-30 DIAGNOSIS — M25512 Pain in left shoulder: Secondary | ICD-10-CM

## 2024-01-30 DIAGNOSIS — Z96612 Presence of left artificial shoulder joint: Secondary | ICD-10-CM

## 2024-01-30 DIAGNOSIS — M6281 Muscle weakness (generalized): Secondary | ICD-10-CM

## 2024-01-30 NOTE — Therapy (Signed)
 OUTPATIENT PHYSICAL THERAPY SHOULDER TREATMENT/ RECERTIFICATION  Patient Name: Andrea [name] MRN: [id number] DOB:1950/12/06, 73 y.o., female Today's Date: 01/30/2024    PT End of Session - 01/30/24 1248     Visit Number 11    Number of Visits 13    Date for Recertification  02/06/24    PT Start Time 1252    PT Stop Time 1323    PT Time Calculation (min) 31 min    Activity Tolerance Patient tolerated treatment well;Patient limited by fatigue;Patient limited by pain    Behavior During Therapy [facility] for tasks assessed/performed                Past Medical History:  Diagnosis Date   Allergic rhinitis due to allergen     Allergy     Breast cancer (HCC) 2008    RT LUMPECTOMY   Breast cancer (HCC) 2011    RT MASTECTOMY   Cancer (HCC) 7991,7988      High-grade DCIS,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   Cervical post-laminectomy syndrome     Chronic constipation     Colon polyp 2010   Complication of anesthesia      itching real bad or msucles spasms   COPD (chronic obstructive pulmonary disease) (HCC) 02/21/2019   Cubital tunnel syndrome     Depression     Disorders of sacrum     Disturbance of skin sensation     Emphysema lung (HCC) 2020   GERD (gastroesophageal reflux disease)     Heart murmur     History of abnormal cervical Pap smear     History of cataract     History of colon polyps     History of pneumonia 2008   Hot flashes     Hx of diplopia      right eye   Hyperlipidemia     Hypertension     Insomnia     Late effects of acute poliomyelitis     Lateral epicondylitis  of elbow     Loss of memory     Menopausal state     Neck pain     Osteoporosis     Pain in joint of left shoulder     Parkinson's disease (HCC) 2011   Peripheral vascular disease (HCC) 2011    venous stasis    Personal history of radiation therapy     PONV (postoperative nausea and vomiting)     Post-polio syndrome     Radiation 2008    BREAST CA   Sleep apnea      Tremor     Unspecified musculoskeletal disorders and symptoms referable to neck      cervical/trapezius   Vitamin D  deficiency                    Past Surgical History:  Procedure Laterality Date   ABDOMINAL HYSTERECTOMY   1997   ANTERIOR CERVICAL DECOMP/DISCECTOMY FUSION N/A 02/01/2019    Procedure: Anterior Cervical Decompression Fusion Cervical three-four, Cervical four-five, hardware removal Cervical five-six;  Surgeon: Louis Shove, MD;  Location: [facility] OR;  Service: Neurosurgery;  Laterality: N/A;   BOWEL RESECTION N/A 04/12/2023    Procedure: SMALL BOWEL RESECTION;  Surgeon: Jordis Laneta FALCON, MD;  Location: [facility] ORS;  Service: General;  Laterality: N/A;   BREAST LUMPECTOMY Right 2008    4 cm area of DCIS, margins less than 1 mm. Treated with wide excision, whole  breast radiation   BREAST SURGERY Right 12/04/2009    Right simple mastectomy, sentinel node biopsy.   CARPAL TUNNEL RELEASE       COLONOSCOPY   2010,2015    Dr. Jinny, Dr. Viktoria   COLONOSCOPY       COLONOSCOPY N/A 06/18/2022    Procedure: COLONOSCOPY;  Surgeon: Onita Elspeth Sharper, DO;  Location: [facility] ENDOSCOPY;  Service: Gastroenterology;  Laterality: N/A;   COLONOSCOPY N/A 10/07/2022    Procedure: COLONOSCOPY;  Surgeon: Onita Elspeth Sharper, DO;  Location: [facility] ENDOSCOPY;  Service: Gastroenterology;  Laterality: N/A;   COLONOSCOPY WITH PROPOFOL  N/A 05/23/2017    Procedure: COLONOSCOPY WITH PROPOFOL ;  Surgeon: Viktoria Lamar DASEN, MD;  Location: [facility] ENDOSCOPY;  Service: Endoscopy;  Laterality: N/A;   ELBOW SURGERY Left 2011   ELBOW SURGERY Left 2015   ESOPHAGOGASTRODUODENOSCOPY       ESOPHAGOGASTRODUODENOSCOPY N/A 08/20/2021    Procedure: ESOPHAGOGASTRODUODENOSCOPY (EGD);  Surgeon: Onita Elspeth Sharper, DO;  Location: [facility] ENDOSCOPY;  Service: Gastroenterology;  Laterality: N/A;   FLEXIBLE SIGMOIDOSCOPY   07/19/2000   HIP SURGERY Right 06/19/2012   LAPAROTOMY N/A 04/12/2023    Procedure: EXPLORATORY LAPAROTOMY;   Surgeon: Jordis Laneta FALCON, MD;  Location: [facility] ORS;  Service: General;  Laterality: N/A;   LEG SURGERY Right 1958    Corrcetive surgery for polio   LEG SURGERY Left 1958    corrective surgery for polio   MASTECTOMY Right 2011    BREAST CA   rectocele/enterocelle repair and perinoplasty       SHOULDER ARTHROSCOPY W/ ROTATOR CUFF REPAIR Bilateral 2010,2012   SHOULDER ARTHROSCOPY WITH OPEN ROTATOR CUFF REPAIR Left 08/19/2015    Procedure: SHOULDER ARTHROSCOPY WITH  MINI OPEN ROTATOR CUFF REPAIR,SUBACROMIAL DECOMPRESSION, ARTHROSCOPIC BICEPS TENODESIS;  Surgeon: Franky Cranker, MD;  Location: [facility] ORS;  Service: Orthopedics;  Laterality: Left;   SHOULDER SURGERY Left 2014   SPINE SURGERY       TOE FUSION Left 1970    little toe fusion   TONSILLECTOMY       TUBAL LIGATION       two left foot surgeries:polio related                    Patient Active Problem List    Diagnosis Date Noted   Small bowel obstruction (HCC) 04/12/2023   SBO (small bowel obstruction) (HCC) 04/12/2023   Primary hypertension 07/28/2021   Insomnia 07/28/2021   History of right mastectomy 06/24/2021   Pneumonia 03/03/2021   Respiratory failure with hypoxia (HCC) 03/03/2021   COPD with acute exacerbation (HCC) 03/03/2021   Wheelchair dependence 10/09/2019   Cervical radiculitis 04/04/2019   Cervical spondylosis with myelopathy and radiculopathy 02/01/2019   B12 deficiency 07/29/2018   Osteoarthritis of hip 09/29/2017   Ataxia 04/15/2017   Loss of memory 04/15/2017   Cardiac murmur 03/04/2017   Hx of adenomatous colonic polyps 12/10/2016   Tremor 08/11/2016   Personal history of tobacco use, presenting hazards to health 06/13/2016   Falls frequently 08/05/2014   Personal history of malignant neoplasm of breast 07/26/2014   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   Lumbosacral spondylosis without myelopathy 04/30/2013   Subacute lumbar radiculopathy 04/30/2013   Chronic low back pain 02/19/2013    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 Muscle weakness.    Rationale for Evaluation and Treatment: Rehabilitation   ONSET DATE: Initial surgery was a left shoulder replacement in ~June 2022. Left Reverse Total Shoulder in ~ April 2024. Left Scapular repair in ~ Nov 2024. May 13th, 2025 was surgical date for left conversion of failed reverse total shoulder hemiarthroplasty and revision scapula.    SUBJECTIVE:                                                                                                                                                                                       SUBJECTIVE STATEMENT: Pt. presents to physical therapy with 3/10 left shoulder pain currently s/p left conversion of failed reverse shoulder to hemiarthroplasty and revision scapula dual plating open reduction and internal fixation on 09/13/23. Pt. States that her pain is primarily over the superior and anterior aspect of the left humeral head and also into the left pectoral region. Pt. States that her pain can increase to a 7/10 at it's worst if she is laying on it, reaching, pulling, or pushing objects such as reaching for objects in the kitchen, putting her shirts on, or taking care of her cat. Easing factors include rest, hydrocodone  (10 mg), and Lidocaine  ointment which can alleviate sharp, pulling symptoms to a 0/10 at its best. Pt. Has not received any home PT services since her surgery. Pt.  Denies any falls within the past 6 months or any recent changes to her PMH.  Hand dominance: Right   PERTINENT HISTORY: Left shoulder replacement in ~June 2022 Left Reverse Total Shoulder in ~ April 2024 Left Scapular repair in ~ Nov 2024  May 13th, 2025 was surgical date for left conversion of failed reverse total shoulder hemiarthroplasty and revision scapula.    PAIN:  Are you having pain? Yes: NPRS scale: 3/10 currently, 7/10 at worst, 0/10 at best Pain location: anterior shoulder and into pectoral region, superior aspect of humeral head  Pain description: sharp, pulling Aggravating factors: Reaching, pulling, pushing, putting on clothing Relieving factors: heat, hydrocodone  medication (10 mg)   PRECAUTIONS: Fall   RED FLAGS: None           WEIGHT BEARING RESTRICTIONS: No   FALLS:  Has patient fallen in last 6 months? No   LIVING ENVIRONMENT: Lives with: lives with their spouse Lives in: House/apartment Stairs: No Has following equipment  at home: Counselling psychologist, Wheelchair (power), and Ramped entry   OCCUPATION: Retired   PLOF: Independent with household mobility with device, Independent with community mobility with device, Independent with gait, Independent with transfers, Needs assistance with ADLs, and Needs assistance with homemaking   PATIENT GOALS:To increase muscle mass in L UE, to have the range of motion required to reach her hair and the cabinets in her kitchen, to become independent with cooking and cleaning tasks at home so she does not have to rely on her husband   NEXT MD VISIT: 02/02/24   OBJECTIVE:  Note: Objective measures were completed at Evaluation unless otherwise noted.   PATIENT SURVEYS:  QuickDash: 54.5%    COGNITION: Overall cognitive status: Within functional limits for tasks assessed                                  SENSATION: WFL   POSTURE: Forward head posture, rounded shoulders, excessive thoracic kyphosis    UPPER  EXTREMITY ROM:    Active ROM Right eval Left eval  Shoulder flexion 126 deg.  62 deg.  Shoulder extension      Shoulder abduction  110 deg. 59 deg.  Shoulder adduction      Shoulder internal rotation  35 deg.  44 deg.  Shoulder external rotation  55 deg. 55 deg.  Elbow flexion      Elbow extension      Wrist flexion      Wrist extension      Wrist ulnar deviation      Wrist radial deviation      Wrist pronation      Wrist supination      (Blank rows = not tested)   Cervical Left Rotation: 30 deg. Cervical Right Rotation: 35 deg.  Passive Left Shoulder ER: 52 deg. Passive Left Shoulder IR: 55 deg. Passive Left Shoulder Flexion: 100 deg. Passive Left Shoulder Abduction: 95 deg.    UPPER EXTREMITY MMT:   MMT Right eval Left eval  Shoulder flexion 4  3-   Shoulder extension      Shoulder abduction  4- 3-   Shoulder adduction      Shoulder internal rotation 4-  3+  Shoulder external rotation  4-  3-  Middle trapezius      Lower trapezius      Elbow flexion  4+ 3   Elbow extension  4+  3*  Wrist flexion      Wrist extension      Wrist ulnar deviation      Wrist radial deviation      Wrist pronation      Wrist supination      Grip strength (lbs) 45.6# 38.8#   (Blank rows = not tested)     PALPATION:  Tenderness to superior and anterior aspect of humeral head. Significant atrophy noted in left periscapular region and around [facility] joint.  TREATMENT DATE: 01/30/2024   Subjective:   Pt. reports ~3/10 left shoulder pain over the anterior aspect of the humerus prior to tx. session. Pt states that it was decided at her follow up appointment today with her referring MD that she should take a break from physical therapy at this time with continued adherence to a home program to assess her response over period of time until ~December before  deciding on any further interventions/course of actions.   There.ex.: Supine and sidelying left shoulder AAROM with therapist assistance (flexion, scaption, abduction) 1x10   Supine rhythmic stabilization with shoulder flexed to 90 degrees, 4x30 seconds  Supine serratus punches with 2# DB, 2x8  Supine D2 Active flexion, 10x total  Standing shoulder rectractions with GTB, 2x12  Seated Bicep Curls, 3#, 2x10  See updated HEP  Manual tx.: PROM of left shoulder in supine (Flexion, abduction, ER, IR)     01/30/24: QuickDash: 59.1%    PATIENT EDUCATION: Education details: Anatomy involved, diagnosis, prognosis, POC, HEP Person educated: Patient Education method: Explanation, Demonstration, Verbal cues, and Handouts Education comprehension: verbalized understanding and returned demonstration   HOME EXERCISE PROGRAM: Access Code: [email or web address] URL: [email or web address] Date: 12/15/2023 Prepared by: Ozell Sero Exercises - Supine Shoulder Press AAROM in Abduction with Dowel - 2 x daily - 7 x weekly - 2 sets - 10 reps - Supine Shoulder Flexion Extension AAROM with Dowel - 2 x daily - 7 x weekly - 2 sets - 10 reps - Supine Shoulder External Rotation in 45 Degrees Abduction AAROM with Dowel - 2 x daily - 7 x weekly - 2 sets - 10 reps - Scapular Retraction with Resistance - 1 x daily - 4-5 x weekly - 2 sets - 15 reps - Shoulder External Rotation and Scapular Retraction with Resistance - 1 x daily - 4-5 x weekly - 2 sets - 15 reps - Seated Shoulder Abduction with Self-Anchored Resistance - 1 x daily - 4-5 x weekly - 2 sets - 15 reps   Access Code: [email or web address] URL: [email or web address] Date: 01/30/2024 Prepared by: Ozell Sero Exercises - Supine Shoulder Flexion Extension AAROM with Dowel - 1 x daily - 4-5 x weekly - 2 sets - 12 reps - Supine Scapular Protraction in Flexion with Dumbbells - 1 x daily - 4-5 x weekly - 2 sets - 12 reps - Scapular Retraction with Resistance -  1 x daily - 4-5 x weekly - 2 sets - 12 reps - Standing Isometric Shoulder Internal Rotation at Doorway - 1 x daily - 4-5 x weekly - 2 sets - 12 reps - Seated Single Arm Bicep Curls Supinated with Dumbbell - 1 x daily - 4-5 x weekly - 2 sets - 12 reps - Shoulder External Rotation and Scapular Retraction with Resistance - 1 x daily - 4-5 x weekly - 2 sets - 12 reps    ASSESSMENT:   CLINICAL IMPRESSION: Pt was progressed to completing seated external rotation Ws and standing scapular retractions with GTB which pt was able to tolerate well without significant difficulty or pain. Updated and issued HEP and patient verbalized understanding and returned demonstration. Pt denied any increased pain at end of tx session. Will continue to monitor pt and progress as tolerated.    OBJECTIVE IMPAIRMENTS: Abnormal gait, decreased endurance, decreased ROM, decreased strength, impaired UE functional use, and pain.    ACTIVITY LIMITATIONS: carrying, lifting, sleeping, transfers, bathing, dressing, reach over head, hygiene/grooming, and locomotion level   PARTICIPATION LIMITATIONS: meal prep, cleaning, laundry, and driving  PERSONAL FACTORS: 3+ comorbidities: post-polio syndrome, COPD, PD are also affecting patient's functional outcome.    REHAB POTENTIAL: Good   CLINICAL DECISION MAKING: Evolving/moderate complexity   EVALUATION COMPLEXITY: Moderate     GOALS: Goals reviewed with patient? Yes   LONG TERM GOALS: Target date: 02/06/24   1.   Pt. will have less than a 5/10 left shoulder pain at its worst so that she is able to put on her shirt in the morning and take care of her cat with greater ease.  Baseline: 7/10 at it's worst 9/11: 7/10 at it's worst Goal status: Not met   2.  Pt. To increase active left shoulder external rotation to >50 deg. To improve ability to complete ADL/ dressing.   Baseline: see above. 9/11: see above Goal status: Not met       3.  Pt. will total a <40% on the QuickDash  Disability Symptom score so that she self perceives a significant improvement in her daily household activities such as doing household chores.  Baseline: 54.5% (09/29): 59.1% Goal status: Not met   4.  Pt. will improve gross left UE strength to at least a 4/5 in order to improve ability to reach for and carry items in her kitchen in order to cook her own meals.  Baseline: Patient currently relies on husband for cooking meals and cannot raise UEs to reach cabinets.   Goal status: Ongoing   5.  Pt. to improve active left shoulder flexion and abduction to at least 90 degrees to improve ability for her to wash and do her hair.  Baseline:  See above Goal status: Ongoing   PLAN:   PT FREQUENCY: 2x/week   PT DURATION: 1 week   PLANNED INTERVENTIONS: 97110-Therapeutic exercises, 97530- Therapeutic activity, [id number]- Neuromuscular re-education, 97535- Self Care, and [id number]- Manual therapy   PLAN FOR NEXT SESSION: Plan for next session to be final discharge at this time.Update all goals/measurements.   Andrea [name], SPT   Andrea [name], PT 01/30/2024

## 2024-01-31 ENCOUNTER — Ambulatory Visit: Admitting: Physical Therapy

## 2024-02-02 ENCOUNTER — Ambulatory Visit: Attending: Orthopedic Surgery | Admitting: Physical Therapy

## 2024-02-02 DIAGNOSIS — Z96612 Presence of left artificial shoulder joint: Secondary | ICD-10-CM | POA: Diagnosis present

## 2024-02-02 DIAGNOSIS — M6281 Muscle weakness (generalized): Secondary | ICD-10-CM | POA: Diagnosis present

## 2024-02-02 DIAGNOSIS — M25512 Pain in left shoulder: Secondary | ICD-10-CM | POA: Diagnosis present

## 2024-02-02 DIAGNOSIS — M25611 Stiffness of right shoulder, not elsewhere classified: Secondary | ICD-10-CM | POA: Diagnosis present

## 2024-02-02 NOTE — Therapy (Signed)
 OUTPATIENT PHYSICAL THERAPY SHOULDER TREATMENT / DISCHARGE  Patient Name: Andrea [name] MRN: [id number] DOB:21-Jun-1950, 73 y.o., female Today's Date: 02/02/2024    PT End of Session - 02/02/24 1118     Visit Number 12    Number of Visits 13    Date for Recertification  02/06/24    PT Start Time 1118    PT Stop Time 1200    PT Time Calculation (min) 42 min    Activity Tolerance Patient tolerated treatment well;Patient limited by fatigue;Patient limited by pain    Behavior During Therapy [facility] for tasks assessed/performed               Past Medical History:  Diagnosis Date   Allergic rhinitis due to allergen     Allergy     Breast cancer (HCC) 2008    RT LUMPECTOMY   Breast cancer (HCC) 2011    RT MASTECTOMY   Cancer (HCC) 7991,7988      High-grade DCIS,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   Cervical post-laminectomy syndrome     Chronic constipation     Colon polyp 2010   Complication of anesthesia      itching real bad or msucles spasms   COPD (chronic obstructive pulmonary disease) (HCC) 02/21/2019   Cubital tunnel syndrome     Depression     Disorders of sacrum     Disturbance of skin sensation     Emphysema lung (HCC) 2020   GERD (gastroesophageal reflux disease)     Heart murmur     History of abnormal cervical Pap smear     History of cataract     History of colon polyps     History of pneumonia 2008   Hot flashes     Hx of diplopia      right eye   Hyperlipidemia     Hypertension     Insomnia     Late effects of acute poliomyelitis     Lateral epicondylitis  of elbow     Loss of memory     Menopausal state     Neck pain     Osteoporosis     Pain in joint of left shoulder     Parkinson's disease (HCC) 2011   Peripheral vascular disease (HCC) 2011    venous stasis    Personal history of radiation therapy     PONV (postoperative nausea and vomiting)     Post-polio syndrome     Radiation 2008    BREAST CA   Sleep apnea     Tremor      Unspecified musculoskeletal disorders and symptoms referable to neck      cervical/trapezius   Vitamin D  deficiency                    Past Surgical History:  Procedure Laterality Date   ABDOMINAL HYSTERECTOMY   1997   ANTERIOR CERVICAL DECOMP/DISCECTOMY FUSION N/A 02/01/2019    Procedure: Anterior Cervical Decompression Fusion Cervical three-four, Cervical four-five, hardware removal Cervical five-six;  Surgeon: Louis Shove, MD;  Location: [facility] OR;  Service: Neurosurgery;  Laterality: N/A;   BOWEL RESECTION N/A 04/12/2023    Procedure: SMALL BOWEL RESECTION;  Surgeon: Jordis Laneta FALCON, MD;  Location: [facility] ORS;  Service: General;  Laterality: N/A;   BREAST LUMPECTOMY Right 2008    4 cm area of DCIS, margins less than 1 mm. Treated with wide excision, whole  breast radiation   BREAST SURGERY Right 12/04/2009    Right simple mastectomy, sentinel node biopsy.   CARPAL TUNNEL RELEASE       COLONOSCOPY   2010,2015    Dr. Jinny, Dr. Viktoria   COLONOSCOPY       COLONOSCOPY N/A 06/18/2022    Procedure: COLONOSCOPY;  Surgeon: Onita Elspeth Sharper, DO;  Location: [facility] ENDOSCOPY;  Service: Gastroenterology;  Laterality: N/A;   COLONOSCOPY N/A 10/07/2022    Procedure: COLONOSCOPY;  Surgeon: Onita Elspeth Sharper, DO;  Location: [facility] ENDOSCOPY;  Service: Gastroenterology;  Laterality: N/A;   COLONOSCOPY WITH PROPOFOL  N/A 05/23/2017    Procedure: COLONOSCOPY WITH PROPOFOL ;  Surgeon: Viktoria Lamar DASEN, MD;  Location: [facility] ENDOSCOPY;  Service: Endoscopy;  Laterality: N/A;   ELBOW SURGERY Left 2011   ELBOW SURGERY Left 2015   ESOPHAGOGASTRODUODENOSCOPY       ESOPHAGOGASTRODUODENOSCOPY N/A 08/20/2021    Procedure: ESOPHAGOGASTRODUODENOSCOPY (EGD);  Surgeon: Onita Elspeth Sharper, DO;  Location: [facility] ENDOSCOPY;  Service: Gastroenterology;  Laterality: N/A;   FLEXIBLE SIGMOIDOSCOPY   07/19/2000   HIP SURGERY Right 06/19/2012   LAPAROTOMY N/A 04/12/2023    Procedure: EXPLORATORY LAPAROTOMY;  Surgeon:  Jordis Laneta FALCON, MD;  Location: [facility] ORS;  Service: General;  Laterality: N/A;   LEG SURGERY Right 1958    Corrcetive surgery for polio   LEG SURGERY Left 1958    corrective surgery for polio   MASTECTOMY Right 2011    BREAST CA   rectocele/enterocelle repair and perinoplasty       SHOULDER ARTHROSCOPY W/ ROTATOR CUFF REPAIR Bilateral 2010,2012   SHOULDER ARTHROSCOPY WITH OPEN ROTATOR CUFF REPAIR Left 08/19/2015    Procedure: SHOULDER ARTHROSCOPY WITH  MINI OPEN ROTATOR CUFF REPAIR,SUBACROMIAL DECOMPRESSION, ARTHROSCOPIC BICEPS TENODESIS;  Surgeon: Franky Cranker, MD;  Location: [facility] ORS;  Service: Orthopedics;  Laterality: Left;   SHOULDER SURGERY Left 2014   SPINE SURGERY       TOE FUSION Left 1970    little toe fusion   TONSILLECTOMY       TUBAL LIGATION       two left foot surgeries:polio related                    Patient Active Problem List    Diagnosis Date Noted   Small bowel obstruction (HCC) 04/12/2023   SBO (small bowel obstruction) (HCC) 04/12/2023   Primary hypertension 07/28/2021   Insomnia 07/28/2021   History of right mastectomy 06/24/2021   Pneumonia 03/03/2021   Respiratory failure with hypoxia (HCC) 03/03/2021   COPD with acute exacerbation (HCC) 03/03/2021   Wheelchair dependence 10/09/2019   Cervical radiculitis 04/04/2019   Cervical spondylosis with myelopathy and radiculopathy 02/01/2019   B12 deficiency 07/29/2018   Osteoarthritis of hip 09/29/2017   Ataxia 04/15/2017   Loss of memory 04/15/2017   Cardiac murmur 03/04/2017   Hx of adenomatous colonic polyps 12/10/2016   Tremor 08/11/2016   Personal history of tobacco use, presenting hazards to health 06/13/2016   Falls frequently 08/05/2014   Personal history of malignant neoplasm of breast 07/26/2014   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   Lumbosacral spondylosis without myelopathy 04/30/2013   Subacute lumbar radiculopathy 04/30/2013   Chronic low back pain 02/19/2013   Post-polio  syndrome 04/11/2012   Sacroiliac joint dysfunction of both sides 07/12/2011   Esophageal reflux 11/02/2010   Pure hypercholesterolemia 11/02/2010   Joint pain 11/02/2010   Mild vitamin D  deficiency 11/02/2010      PCP: Rudolpho Norleen BIRCH,  MD   REFERRING PROVIDER: Carolee Lamar LABOR, MD   REFERRING DIAG:  S42.122G (ICD-10-CM) - Displaced fracture of acromial process, left shoulder, subsequent encounter for fracture with delayed healing  Z96.612 (ICD-10-CM) - Presence of left artificial shoulder joint      THERAPY DIAG: Left shoulder pain. Muscle weakness.    Rationale for Evaluation and Treatment: Rehabilitation   ONSET DATE: Initial surgery was a left shoulder replacement in ~June 2022. Left Reverse Total Shoulder in ~ April 2024. Left Scapular repair in ~ Nov 2024. May 13th, 2025 was surgical date for left conversion of failed reverse total shoulder hemiarthroplasty and revision scapula.    SUBJECTIVE:                                                                                                                                                                                       SUBJECTIVE STATEMENT: Pt. presents to physical therapy with 3/10 left shoulder pain currently s/p left conversion of failed reverse shoulder to hemiarthroplasty and revision scapula dual plating open reduction and internal fixation on 09/13/23. Pt. States that her pain is primarily over the superior and anterior aspect of the left humeral head and also into the left pectoral region. Pt. States that her pain can increase to a 7/10 at it's worst if she is laying on it, reaching, pulling, or pushing objects such as reaching for objects in the kitchen, putting her shirts on, or taking care of her cat. Easing factors include rest, hydrocodone  (10 mg), and Lidocaine  ointment which can alleviate sharp, pulling symptoms to a 0/10 at its best. Pt. Has not received any home PT services since her surgery. Pt. Denies any  falls within the past 6 months or any recent changes to her PMH.  Hand dominance: Right   PERTINENT HISTORY: Left shoulder replacement in ~June 2022 Left Reverse Total Shoulder in ~ April 2024 Left Scapular repair in ~ Nov 2024  May 13th, 2025 was surgical date for left conversion of failed reverse total shoulder hemiarthroplasty and revision scapula.    PAIN:  Are you having pain? Yes: NPRS scale: 3/10 currently, 7/10 at worst, 0/10 at best Pain location: anterior shoulder and into pectoral region, superior aspect of humeral head  Pain description: sharp, pulling Aggravating factors: Reaching, pulling, pushing, putting on clothing Relieving factors: heat, hydrocodone  medication (10 mg)   PRECAUTIONS: Fall   RED FLAGS: None           WEIGHT BEARING RESTRICTIONS: No   FALLS:  Has patient fallen in last 6 months? No   LIVING ENVIRONMENT: Lives with: lives with their spouse Lives in: House/apartment Stairs: No Has following equipment  at home: Counselling psychologist, Wheelchair (power), and Ramped entry   OCCUPATION: Retired   PLOF: Independent with household mobility with device, Independent with community mobility with device, Independent with gait, Independent with transfers, Needs assistance with ADLs, and Needs assistance with homemaking   PATIENT GOALS:To increase muscle mass in L UE, to have the range of motion required to reach her hair and the cabinets in her kitchen, to become independent with cooking and cleaning tasks at home so she does not have to rely on her husband   NEXT MD VISIT: 02/02/24   OBJECTIVE:  Note: Objective measures were completed at Evaluation unless otherwise noted.   PATIENT SURVEYS:  QuickDash: 54.5%    COGNITION: Overall cognitive status: Within functional limits for tasks assessed                                  SENSATION: WFL   POSTURE: Forward head posture, rounded shoulders, excessive thoracic kyphosis    UPPER EXTREMITY ROM:     Active ROM Right eval Left eval  Shoulder flexion 126 deg.  62 deg.  Shoulder extension      Shoulder abduction  110 deg. 59 deg.  Shoulder adduction      Shoulder internal rotation  35 deg.  44 deg.  Shoulder external rotation  55 deg. 55 deg.  Elbow flexion      Elbow extension      Wrist flexion      Wrist extension      Wrist ulnar deviation      Wrist radial deviation      Wrist pronation      Wrist supination      (Blank rows = not tested)   Cervical Left Rotation: 30 deg. Cervical Right Rotation: 35 deg.  Passive Left Shoulder ER: 52 deg. Passive Left Shoulder IR: 55 deg. Passive Left Shoulder Flexion: 100 deg. Passive Left Shoulder Abduction: 95 deg.    UPPER EXTREMITY MMT:   MMT Right eval Left eval  Shoulder flexion 4  3-   Shoulder extension      Shoulder abduction  4- 3-   Shoulder adduction      Shoulder internal rotation 4-  3+  Shoulder external rotation  4-  3-  Middle trapezius      Lower trapezius      Elbow flexion  4+ 3   Elbow extension  4+  3*  Wrist flexion      Wrist extension      Wrist ulnar deviation      Wrist radial deviation      Wrist pronation      Wrist supination      Grip strength (lbs) 45.6# 38.8#   (Blank rows = not tested)     PALPATION:  Tenderness to superior and anterior aspect of humeral head. Significant atrophy noted in left periscapular region and around [facility] joint.              01/30/24: QuickDash: 59.1%  TREATMENT DATE: 02/02/2024   Subjective:   Pt. reports ~3/10 left shoulder pain over the anterior aspect of the humerus prior to tx. session.   There.ex.: Supine and sidelying left shoulder AAROM with therapist assistance (flexion, scaption, abduction) 1x10   Supine rhythmic stabilization with shoulder flexed to 90 degrees, 4x30 seconds  Supine serratus punches with 2# DB, 2x10  Supine  D2 Active flexion with min manual resistance from therapist, 10x total  Seated Bicep Curls, 3#, 2x10   Seated Nautilus activities: Tricep extensions, scapular retractions, and pulldowns, 2x10 each   Manual tx.: PROM of left shoulder in supine (Flexion, abduction, ER, IR)    Left shoulder ROM measured on this day:  ER: 55 deg IR: 44 deg Flexion: 61 deg Abduction: 63 deg  MMT Right eval Left eval  Shoulder flexion 4  3-   Shoulder extension      Shoulder abduction  4 3-   Shoulder adduction      Shoulder internal rotation 4 4-  Shoulder external rotation  4-  3+  Middle trapezius      Lower trapezius      Elbow flexion  4+ 4-  Elbow extension  4+  4-  Wrist flexion      Wrist extension      Wrist ulnar deviation      Wrist radial deviation      Wrist pronation      Wrist supination      Grip strength (lbs)     (Blank rows = not tested)    PATIENT EDUCATION: Education details: Anatomy involved, diagnosis, prognosis, POC, HEP Person educated: Patient Education method: Explanation, Demonstration, Verbal cues, and Handouts Education comprehension: verbalized understanding and returned demonstration   HOME EXERCISE PROGRAM: Access Code: [email or web address] URL: [email or web address] Date: 12/15/2023 Prepared by: Ozell Sero Exercises - Supine Shoulder Press AAROM in Abduction with Dowel - 2 x daily - 7 x weekly - 2 sets - 10 reps - Supine Shoulder Flexion Extension AAROM with Dowel - 2 x daily - 7 x weekly - 2 sets - 10 reps - Supine Shoulder External Rotation in 45 Degrees Abduction AAROM with Dowel - 2 x daily - 7 x weekly - 2 sets - 10 reps - Scapular Retraction with Resistance - 1 x daily - 4-5 x weekly - 2 sets - 15 reps - Shoulder External Rotation and Scapular Retraction with Resistance - 1 x daily - 4-5 x weekly - 2 sets - 15 reps - Seated Shoulder Abduction with Self-Anchored Resistance - 1 x daily - 4-5 x weekly - 2 sets - 15 reps   Access Code: [email or web address] URL:  [email or web address] Date: 01/30/2024 Prepared by: Ozell Sero Exercises - Supine Shoulder Flexion Extension AAROM with Dowel - 1 x daily - 4-5 x weekly - 2 sets - 12 reps - Supine Scapular Protraction in Flexion with Dumbbells - 1 x daily - 4-5 x weekly - 2 sets - 12 reps - Scapular Retraction with Resistance - 1 x daily - 4-5 x weekly - 2 sets - 12 reps - Standing Isometric Shoulder Internal Rotation at Doorway - 1 x daily - 4-5 x weekly - 2 sets - 12 reps - Seated Single Arm Bicep Curls Supinated with Dumbbell - 1 x daily - 4-5 x weekly - 2 sets - 12 reps - Shoulder External Rotation and Scapular Retraction with Resistance - 1 x daily - 4-5 x weekly - 2 sets - 12 reps    ASSESSMENT:   CLINICAL IMPRESSION:  Pt presented to physical therapy with 3/10 left shoulder pain. Pt was progressed to completing supine active D2 left shoulder flexion against min manual resistance from therapist which she tolerated well. Updated MMT strength testing in seated position and left shoulder AROM (see above). Pt reports fatigue of left shoulder musculature during seated Nautilus pulldowns but was able to complete exercise. Pt pain levels remained at 3/10 at end of tx session.   Pt has attended and actively participated in physical therapy visits to date.Pt has seen improvements in gross UE strength and endurance and left shoulder mobility. Pt is still limited by AROM of left shoulder (especially elevation), decreased strength, and high levels of pain which limits her ability to complete functional transfers, ADLs, and household activities without significant difficulty or high levels of left shoulder pain. Pt to be discharged from physical therapy services at this time to assess response over next couple of months with adherence to HEP before deciding on further intervention/course of action with her referring provider.      OBJECTIVE IMPAIRMENTS: Abnormal gait, decreased endurance, decreased ROM, decreased  strength, impaired UE functional use, and pain.    ACTIVITY LIMITATIONS: carrying, lifting, sleeping, transfers, bathing, dressing, reach over head, hygiene/grooming, and locomotion level   PARTICIPATION LIMITATIONS: meal prep, cleaning, laundry, and driving   PERSONAL FACTORS: 3+ comorbidities: post-polio syndrome, COPD, PD are also affecting patient's functional outcome.    REHAB POTENTIAL: Good   CLINICAL DECISION MAKING: Evolving/moderate complexity   EVALUATION COMPLEXITY: Moderate     GOALS: Goals reviewed with patient? Yes   LONG TERM GOALS: Target date: 02/06/24   1.   Pt. will have less than a 5/10 left shoulder pain at its worst so that she is able to put on her shirt in the morning and take care of her cat with greater ease.  Baseline: 7/10 at it's worst 9/11: 7/10 at it's worst Goal status: Not met   2.  Pt. To increase active left shoulder external rotation to >50 deg. To improve ability to complete ADL/ dressing.   Baseline: see above. 9/11: see above Goal status: Not met       3.  Pt. will total a <40% on the QuickDash Disability Symptom score so that she self perceives a significant improvement in her daily household activities such as doing household chores.  Baseline: 54.5% (09/29): 59.1%  Goal status: Not met   4.  Pt. will improve gross left UE strength to at least a 4/5 in order to improve ability to reach for and carry items in her kitchen in order to cook her own meals.  Baseline: Patient currently relies on husband for cooking meals and cannot raise UEs to reach cabinets.  (10/02): see above Goal status: Not met   5.  Pt. to improve active left shoulder flexion and abduction to at least 90 degrees to improve ability for her to wash and do her hair.  Baseline:  See above (10/02) Goal status: Not met   PLAN:   PT FREQUENCY: 2x/week   PT DURATION: 1 week   PLANNED INTERVENTIONS: 97110-Therapeutic exercises, 97530- Therapeutic activity, [id number]-  Neuromuscular re-education, 97535- Self Care, and 02859- Manual therapy   PLAN FOR NEXT SESSION: Pt to be discharged from physical therapy services at this time.  Pt. Will focus on HEP and instructed to contact PT if any questions or concerns.     Curtistine Bracket, SPT   Ileene Ozell BROCKS, PT 02/02/2024

## 2024-02-07 ENCOUNTER — Encounter: Admitting: Physical Therapy

## 2024-02-09 ENCOUNTER — Encounter: Admitting: Physical Therapy

## 2024-02-16 ENCOUNTER — Encounter: Admitting: Physical Therapy

## 2024-02-23 ENCOUNTER — Encounter: Admitting: Physical Therapy

## 2024-03-08 ENCOUNTER — Ambulatory Visit: Admitting: Anesthesiology

## 2024-03-08 ENCOUNTER — Other Ambulatory Visit: Payer: Self-pay

## 2024-03-08 ENCOUNTER — Encounter: Payer: Self-pay | Admitting: Gastroenterology

## 2024-03-08 ENCOUNTER — Encounter: Admission: RE | Disposition: A | Payer: Self-pay | Source: Home / Self Care | Attending: Gastroenterology

## 2024-03-08 ENCOUNTER — Ambulatory Visit
Admission: RE | Admit: 2024-03-08 | Discharge: 2024-03-08 | Disposition: A | Discharge status: Home / Self Care | Attending: Gastroenterology | Admitting: Gastroenterology

## 2024-03-08 DIAGNOSIS — K224 Dyskinesia of esophagus: Secondary | ICD-10-CM | POA: Diagnosis not present

## 2024-03-08 DIAGNOSIS — K31A0 Gastric intestinal metaplasia, unspecified: Secondary | ICD-10-CM | POA: Diagnosis not present

## 2024-03-08 DIAGNOSIS — K219 Gastro-esophageal reflux disease without esophagitis: Secondary | ICD-10-CM | POA: Diagnosis not present

## 2024-03-08 DIAGNOSIS — R1314 Dysphagia, pharyngoesophageal phase: Secondary | ICD-10-CM | POA: Diagnosis not present

## 2024-03-08 DIAGNOSIS — I739 Peripheral vascular disease, unspecified: Secondary | ICD-10-CM | POA: Insufficient documentation

## 2024-03-08 DIAGNOSIS — K2970 Gastritis, unspecified, without bleeding: Secondary | ICD-10-CM | POA: Insufficient documentation

## 2024-03-08 DIAGNOSIS — I10 Essential (primary) hypertension: Secondary | ICD-10-CM | POA: Diagnosis not present

## 2024-03-08 DIAGNOSIS — R1310 Dysphagia, unspecified: Secondary | ICD-10-CM | POA: Diagnosis present

## 2024-03-08 DIAGNOSIS — K2289 Other specified disease of esophagus: Secondary | ICD-10-CM | POA: Diagnosis not present

## 2024-03-08 DIAGNOSIS — G4730 Sleep apnea, unspecified: Secondary | ICD-10-CM | POA: Diagnosis not present

## 2024-03-08 DIAGNOSIS — F32A Depression, unspecified: Secondary | ICD-10-CM | POA: Diagnosis not present

## 2024-03-08 DIAGNOSIS — Z79899 Other long term (current) drug therapy: Secondary | ICD-10-CM | POA: Diagnosis not present

## 2024-03-08 DIAGNOSIS — K222 Esophageal obstruction: Secondary | ICD-10-CM | POA: Diagnosis not present

## 2024-03-08 DIAGNOSIS — Z87891 Personal history of nicotine dependence: Secondary | ICD-10-CM | POA: Diagnosis not present

## 2024-03-08 DIAGNOSIS — M810 Age-related osteoporosis without current pathological fracture: Secondary | ICD-10-CM | POA: Diagnosis not present

## 2024-03-08 HISTORY — PX: ESOPHAGOGASTRODUODENOSCOPY: SHX5428

## 2024-03-08 HISTORY — PX: ESOPHAGEAL DILATION: SHX303

## 2024-03-08 SURGERY — EGD (ESOPHAGOGASTRODUODENOSCOPY)
Anesthesia: General

## 2024-03-08 MED ORDER — PROPOFOL 10 MG/ML IV BOLUS
INTRAVENOUS | Status: DC | PRN
  Administered 2024-03-08: 20 mg via INTRAVENOUS
  Administered 2024-03-08: 30 mg via INTRAVENOUS

## 2024-03-08 MED ORDER — PROPOFOL 500 MG/50ML IV EMUL
INTRAVENOUS | Status: DC | PRN
  Administered 2024-03-08: 50 ug/kg/min via INTRAVENOUS

## 2024-03-08 MED ORDER — SODIUM CHLORIDE 0.9 % IV SOLN: INTRAVENOUS | Status: DC

## 2024-03-08 MED ORDER — GLYCOPYRROLATE 0.2 MG/ML IJ SOLN
INTRAMUSCULAR | Status: DC | PRN
  Administered 2024-03-08: .2 mg via INTRAVENOUS

## 2024-03-08 MED ORDER — GLYCOPYRROLATE 0.2 MG/ML IJ SOLN
INTRAMUSCULAR | Status: AC
Start: 2024-03-08 — End: 2024-03-08
  Filled 2024-03-08: qty 1

## 2024-03-08 MED ORDER — LIDOCAINE HCL (PF) 2 % IJ SOLN
INTRAMUSCULAR | Status: AC
  Filled 2024-03-08: qty 5

## 2024-03-08 MED ORDER — DEXMEDETOMIDINE HCL IN NACL 80 MCG/20ML IV SOLN
INTRAVENOUS | Status: DC | PRN
Start: 2024-03-08 — End: 2024-03-08
  Administered 2024-03-08 (×2): 8 ug via INTRAVENOUS
  Administered 2024-03-08: 4 ug via INTRAVENOUS

## 2024-03-08 MED ORDER — LIDOCAINE HCL (CARDIAC) PF 100 MG/5ML IV SOSY
PREFILLED_SYRINGE | INTRAVENOUS | Status: DC | PRN
  Administered 2024-03-08: 40 mg via INTRAVENOUS

## 2024-03-08 NOTE — Interval H&P Note (Signed)
 History and Physical Interval Note: Preprocedure H&P from 03/08/24  was reviewed and there was no interval change after seeing and examining the patient.  Written consent was obtained from the patient after discussion of risks, benefits, and alternatives. Patient has consented to proceed with Esophagogastroduodenoscopy with possible intervention   03/08/2024 10:26 AM  Andrea [name]  has presented today for surgery, with the diagnosis of Esophageal dysphagia.  The various methods of treatment have been discussed with the patient and family. After consideration of risks, benefits and other options for treatment, the patient has consented to  Procedure(s): EGD (ESOPHAGOGASTRODUODENOSCOPY) (N/A) as a surgical intervention.  The patient's history has been reviewed, patient examined, no change in status, stable for surgery.  I have reviewed the patient's chart and labs.  Questions were answered to the patient's satisfaction.     [name]

## 2024-03-08 NOTE — Transfer of Care (Signed)
 Immediate Anesthesia Transfer of Care Note  Patient: Andrea [name]  Procedure(s) Performed: EGD (ESOPHAGOGASTRODUODENOSCOPY) DILATION, ESOPHAGUS  Patient Location: PACU  Anesthesia Type:General  Level of Consciousness: sedated  Airway & Oxygen Therapy: Patient Spontanous Breathing  Post-op Assessment: Report given to RN and Post -op Vital signs reviewed and stable  Post vital signs: Reviewed and stable  Last Vitals:  Vitals Value Taken Time  BP 139/83 03/08/24 10:47  Temp 36.2 C 03/08/24 10:47  Pulse 65 03/08/24 10:48  Resp 11 03/08/24 10:48  SpO2 98 % 03/08/24 10:48  Vitals shown include unfiled device data.  Last Pain:  Vitals:   03/08/24 1047  TempSrc: Temporal  PainSc: 0-No pain         Complications: No notable events documented.

## 2024-03-08 NOTE — H&P (Signed)
 Pre-Procedure H&P   Patient ID: Andrea [name] is a 73 y.o. female.  Gastroenterology Provider: [name], DO  Referring Provider: Delmar Gails, NP PCP: Rudolpho Norleen BIRCH, MD  Date: 03/08/2024  HPI [name] is a 73 y.o. female who presents today for Esophagogastroduodenoscopy for Dysphagia . Patient presents with solid food dysphagia and regurgitation.  She has a history of a prominent cricopharyngeal bar.  Underwent EGD with 54 French Maloney dilation in April 2023.  Diagnosed with presbyesophagus as well.  Normal modified barium swallow study  Focal gastric intestinal metaplasia in 2015.  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   Cervical post-laminectomy syndrome    Chronic constipation    Colon polyp 2010   Complication of anesthesia    itching real bad or msucles spasms   COPD (chronic obstructive pulmonary disease) (HCC) 02/21/2019   Cubital tunnel syndrome    Depression    Disorders of sacrum    Disturbance of skin sensation    Emphysema lung (HCC) 2020   GERD (gastroesophageal reflux disease)    Heart murmur    History of abnormal cervical Pap smear    History of cataract    History of colon polyps    History of pneumonia 2008   Hot flashes    Hx of diplopia    right eye   Hyperlipidemia    Hypertension    Insomnia    Late effects of acute poliomyelitis    Lateral epicondylitis  of elbow    Loss of memory    Menopausal state    Neck pain    Osteoporosis    Pain in joint of left shoulder    Parkinson's disease (HCC) 2011   Peripheral vascular disease 2011   venous stasis    Personal history of radiation therapy    PONV (postoperative nausea and vomiting)    Post-polio  syndrome (HCC)    Radiation 2008   BREAST CA   Sleep apnea    Tremor    Unspecified musculoskeletal disorders and symptoms referable to neck    cervical/trapezius   Vitamin D  deficiency     Past Surgical History:  Procedure Laterality Date   ABDOMINAL HYSTERECTOMY  1997   ANTERIOR CERVICAL DECOMP/DISCECTOMY FUSION N/A 02/01/2019   Procedure: Anterior Cervical Decompression Fusion Cervical three-four, Cervical four-five, hardware removal Cervical five-six;  Surgeon: Louis Shove, MD;  Location: [facility] OR;  Service: Neurosurgery;  Laterality: N/A;   BOWEL RESECTION N/A 04/12/2023   Procedure: SMALL BOWEL RESECTION;  Surgeon: Jordis Laneta FALCON, MD;  Location: [facility] ORS;  Service: General;  Laterality: N/A;   BREAST LUMPECTOMY Right 2008   4 cm area of DCIS, margins less than 1 mm. Treated with wide excision, whole breast radiation   BREAST SURGERY Right 12/04/2009   Right simple mastectomy, sentinel node biopsy.   CARPAL TUNNEL RELEASE     COLONOSCOPY  2010,2015   Dr. Jinny, Dr. Viktoria   COLONOSCOPY     COLONOSCOPY N/A 06/18/2022   Procedure: COLONOSCOPY;  Surgeon: Jungling Elspeth Ozell, DO;  Location: [facility] ENDOSCOPY;  Service: Gastroenterology;  Laterality: N/A;   COLONOSCOPY N/A 10/07/2022  Procedure: COLONOSCOPY;  Surgeon: Onita Elspeth Sharper, DO;  Location: [facility] ENDOSCOPY;  Service: Gastroenterology;  Laterality: N/A;   COLONOSCOPY WITH PROPOFOL  N/A 05/23/2017   Procedure: COLONOSCOPY WITH PROPOFOL ;  Surgeon: Viktoria Lamar DASEN, MD;  Location: [facility] ENDOSCOPY;  Service: Endoscopy;  Laterality: N/A;   ELBOW SURGERY Left 2011   ELBOW SURGERY Left 2015   ESOPHAGOGASTRODUODENOSCOPY     ESOPHAGOGASTRODUODENOSCOPY N/A 08/20/2021   Procedure: ESOPHAGOGASTRODUODENOSCOPY (EGD);  Surgeon: Onita Elspeth Sharper, DO;  Location: [facility] ENDOSCOPY;  Service: Gastroenterology;  Laterality: N/A;   FLEXIBLE SIGMOIDOSCOPY  07/19/2000   HIP SURGERY Right 06/19/2012   LAPAROTOMY N/A 04/12/2023   Procedure:  EXPLORATORY LAPAROTOMY;  Surgeon: Jordis Laneta FALCON, MD;  Location: [facility] ORS;  Service: General;  Laterality: N/A;   LEG SURGERY Right 1958   Corrcetive surgery for polio   LEG SURGERY Left 1958   corrective surgery for polio   MASTECTOMY Right 2011   BREAST CA   rectocele/enterocelle repair and perinoplasty     SHOULDER ARTHROSCOPY W/ ROTATOR CUFF REPAIR Bilateral 2010,2012   SHOULDER ARTHROSCOPY WITH OPEN ROTATOR CUFF REPAIR Left 08/19/2015   Procedure: SHOULDER ARTHROSCOPY WITH  MINI OPEN ROTATOR CUFF REPAIR,SUBACROMIAL DECOMPRESSION, ARTHROSCOPIC BICEPS TENODESIS;  Surgeon: Franky Cranker, MD;  Location: [facility] ORS;  Service: Orthopedics;  Laterality: Left;   SHOULDER SURGERY Left 2014   SPINE SURGERY     TOE FUSION Left 1970   little toe fusion   TONSILLECTOMY     TUBAL LIGATION     two left foot surgeries:polio related      Family History No h/o GI disease or malignancy  Review of Systems  Constitutional:  Negative for activity change, appetite change, chills, diaphoresis, fatigue, fever and unexpected weight change.  HENT:  Positive for trouble swallowing. Negative for voice change.   Respiratory:  Negative for shortness of breath and wheezing.   Cardiovascular:  Negative for chest pain, palpitations and leg swelling.  Gastrointestinal:  Negative for abdominal distention, abdominal pain, anal bleeding, blood in stool, constipation, diarrhea, nausea, rectal pain and vomiting.  Musculoskeletal:  Negative for arthralgias and myalgias.  Skin:  Negative for color change and pallor.  Neurological:  Negative for dizziness, syncope and weakness.  Psychiatric/Behavioral:  Negative for confusion.   All other systems reviewed and are negative.    Medications No current facility-administered medications on file prior to encounter.   Current Outpatient Medications on File Prior to Encounter  Medication Sig Dispense Refill   amLODipine  (NORVASC ) 2.5 MG tablet Take 2.5 mg by mouth daily.      aspirin EC 81 MG tablet Take 81 mg by mouth daily. Swallow whole.     atorvastatin  (LIPITOR ) 40 MG tablet Take 40 mg by mouth daily.     Cholecalciferol (D3 VITAMIN PO) Take 2,000 Units by mouth daily.     DULoxetine (CYMBALTA) 60 MG capsule Take 60 mg by mouth daily.     ferrous sulfate 325 (65 FE) MG tablet Take 325 mg by mouth daily with breakfast.     HYDROcodone -acetaminophen  (NORCO) 10-325 MG tablet Take 1 tablet by mouth every 6 (six) hours as needed for moderate pain.      losartan  (COZAAR ) 25 MG tablet Take 1 tablet by mouth daily.     meloxicam  (MOBIC ) 7.5 MG tablet Take 7.5 mg by mouth 2 (two) times daily.      omeprazole (PRILOSEC) 40 MG capsule Take 40 mg by mouth daily.     polyethylene glycol (MIRALAX  / GLYCOLAX ) packet Take 17  g by mouth daily as needed for moderate constipation.      pregabalin  (LYRICA ) 150 MG capsule Take 150 mg by mouth 3 (three) times daily.     tiZANidine  (ZANAFLEX ) 4 MG tablet Take 4 mg by mouth 4 (four) times daily as needed for muscle spasms.      albuterol  (VENTOLIN  HFA) 108 (90 Base) MCG/ACT inhaler Inhale 2 puffs into the lungs every 6 (six) hours as needed for wheezing or shortness of breath. 8 g 2   amLODipine  (NORVASC ) 2.5 MG tablet Take 2.5 mg by mouth daily. (Patient not taking: Reported on 03/08/2024)     diphenhydrAMINE  (BENADRYL ) 50 MG tablet Take 50 mg by mouth at bedtime as needed for itching.     triamcinolone  (NASACORT ) 55 MCG/ACT AERO nasal inhaler Place 2 sprays into the nose daily as needed (allergies).      Pertinent medications related to GI and procedure were reviewed by me with the patient prior to the procedure   Current Facility-Administered Medications:    0.9 %  sodium chloride  infusion, , Intravenous, Continuous, Onita Elspeth Sharper, DO, Last Rate: 20 mL/hr at 03/08/24 1016, Continued from Pre-op at 03/08/24 1016  sodium chloride  20 mL/hr at 03/08/24 1016       Allergies  Allergen Reactions   Lisinopril     Raises  BP   Allergies were reviewed by me prior to the procedure  Objective   Body mass index is 21.29 kg/m. Vitals:   03/08/24 0950  BP: 131/78  Pulse: 60  Resp: 14  Temp: (!) 96.9 F (36.1 C)  TempSrc: Temporal  SpO2: 97%  Weight: 47.8 kg  Height: 4' 11 (1.499 m)     Physical Exam Vitals and nursing note reviewed.  Constitutional:      General: She is not in acute distress.    Appearance: Normal appearance. She is not ill-appearing, toxic-appearing or diaphoretic.  HENT:     Head: Normocephalic and atraumatic.     Nose: Nose normal.     Mouth/Throat:     Mouth: Mucous membranes are moist.     Pharynx: Oropharynx is clear.  Eyes:     General: No scleral icterus.    Extraocular Movements: Extraocular movements intact.  Cardiovascular:     Rate and Rhythm: Regular rhythm. Bradycardia present.     Heart sounds: Normal heart sounds. No murmur heard.    No friction rub. No gallop.  Pulmonary:     Effort: Pulmonary effort is normal. No respiratory distress.     Breath sounds: Normal breath sounds. No wheezing, rhonchi or rales.  Abdominal:     General: Bowel sounds are normal. There is no distension.     Palpations: Abdomen is soft.     Tenderness: There is no abdominal tenderness. There is no guarding or rebound.  Musculoskeletal:     Cervical back: Neck supple.     Right lower leg: No edema.     Left lower leg: No edema.  Skin:    General: Skin is warm and dry.     Coloration: Skin is not jaundiced or pale.  Neurological:     General: No focal deficit present.     Mental Status: She is alert and oriented to person, place, and time. Mental status is at baseline.  Psychiatric:        Mood and Affect: Mood normal.        Behavior: Behavior normal.        Thought Content: Thought content  normal.        Judgment: Judgment normal.      Assessment:  [name] is a 73 y.o. female  who presents today for Esophagogastroduodenoscopy for Dysphagia .  Plan:   Esophagogastroduodenoscopy with possible intervention today  Esophagogastroduodenoscopy with possible biopsy, control of bleeding, polypectomy, and interventions as necessary has been discussed with the patient/patient representative. Informed consent was obtained from the patient/patient representative after explaining the indication, nature, and risks of the procedure including but not limited to death, bleeding, perforation, missed neoplasm/lesions, cardiorespiratory compromise, and reaction to medications. Opportunity for questions was given and appropriate answers were provided. Patient/patient representative has verbalized understanding is amenable to undergoing the procedure.   [name], DO  [facility] Gastroenterology  Portions of the record may have been created with voice recognition software. Occasional wrong-word or 'sound-a-like' substitutions may have occurred due to the inherent limitations of voice recognition software.  Read the chart carefully and recognize, using context, where substitutions may have occurred.

## 2024-03-08 NOTE — Op Note (Signed)
 [facility] Gastroenterology Patient Name: Andrea [name] Procedure Date: 03/08/2024 10:17 AM MRN: [id number] Account #: [id number] Date of Birth: 10/10/1950 Admit Type: Outpatient Age: 73 Room: [facility] ENDO ROOM 1 Gender: Female Note Status: Finalized Instrument Name: Upper GI Scope [id number] Procedure:             Upper GI endoscopy Indications:           Dysphagia Providers:             [name] DO, DO Referring MD:          Norleen CHARM Rower, MD (Referring MD) Medicines:             Monitored Anesthesia Care Complications:         No immediate complications. Estimated blood loss:                         Minimal. Procedure:             Pre-Anesthesia Assessment:                        - Prior to the procedure, a History and Physical was                         performed, and patient medications and allergies were                         reviewed. The patient is competent. The risks and                         benefits of the procedure and the sedation options and                         risks were discussed with the patient. All questions                         were answered and informed consent was obtained.                         Patient identification and proposed procedure were                         verified by the physician, the nurse, the anesthetist                         and the technician in the endoscopy suite. Mental                         Status Examination: alert and oriented. Airway                         Examination: normal oropharyngeal airway and neck                         mobility. Respiratory Examination: clear to                         auscultation. CV Examination: RRR, no murmurs, no S3  or S4. Prophylactic Antibiotics: The patient does not                         require prophylactic antibiotics. Prior                         Anticoagulants: The patient has taken no anticoagulant                          or antiplatelet agents. ASA Grade Assessment: III - A                         patient with severe systemic disease. After reviewing                         the risks and benefits, the patient was deemed in                         satisfactory condition to undergo the procedure. The                         anesthesia plan was to use monitored anesthesia care                         (MAC). Immediately prior to administration of                         medications, the patient was re-assessed for adequacy                         to receive sedatives. The heart rate, respiratory                         rate, oxygen saturations, blood pressure, adequacy of                         pulmonary ventilation, and response to care were                         monitored throughout the procedure. The physical                         status of the patient was re-assessed after the                         procedure.                        After obtaining informed consent, the endoscope was                         passed under direct vision. Throughout the procedure,                         the patient's blood pressure, pulse, and oxygen                         saturations were monitored continuously. The Endoscope  was introduced through the mouth, and advanced to the                         second part of duodenum. The upper GI endoscopy was                         accomplished without difficulty. The patient tolerated                         the procedure well. Findings:      The duodenal bulb, first portion of the duodenum and second portion of       the duodenum were normal. Estimated blood loss: none.      Localized mild inflammation characterized by erythema was found in the       gastric antrum. Biopsies were taken with a cold forceps for histology.       Personal history of gastric intestinal metaplasia. Two jars created with       biopsies- antrum/inscisura and body biopsies  placed separately.       Estimated blood loss was minimal.      The entire examined stomach was normal. Imaging was performed using       white light and narrow band imaging to visualize the mucosa. Estimated       blood loss: none.      The gastroesophageal flap valve was visualized endoscopically and       classified as Hill Grade II (fold present, opens with respiration).       Estimated blood loss: none.      The Z-line was regular. Estimated blood loss: none.      Esophagogastric landmarks were identified: the gastroesophageal junction       was found at 36 cm from the incisors.      Abnormal motility was noted in the esophagus. The cricopharyngeus was       normal. There is spasticity of the esophageal body. The distal       esophagus/lower esophageal sphincter is open. Estimated blood loss:       none. The scope was withdrawn. Dilation was performed with a Maloney       dilator with no resistance at 54 Fr. The dilation site was examined       following endoscope reinsertion and showed no change. Estimated blood       loss: none.      The exam of the esophagus was otherwise normal. Impression:            - Normal duodenal bulb, first portion of the duodenum                         and second portion of the duodenum.                        - Gastritis. Biopsied.                        - Normal stomach.                        - Gastroesophageal flap valve classified as Hill Grade                         II (fold present,  opens with respiration).                        - Z-line regular.                        - Esophagogastric landmarks identified.                        - Abnormal esophageal motility, consistent with                         presbyesophagus. Dilated. Recommendation:        - Patient has a contact number available for                         emergencies. The signs and symptoms of potential                         delayed complications were discussed with the patient.                          Return to normal activities tomorrow. Written                         discharge instructions were provided to the patient.                        - Discharge patient to home.                        - Resume previous diet.                        - Continue present medications.                        - Await pathology results.                        - Return to GI clinic as previously scheduled.                        - The findings and recommendations were discussed with                         the patient.                        - The findings and recommendations were discussed with                         the patient's family. Procedure Code(s):     --- Professional ---                        [phone], Esophagogastroduodenoscopy, flexible,                         transoral; with biopsy, single or multiple                        43450, Dilation of esophagus, by unguided  sound or                         bougie, single or multiple passes Diagnosis Code(s):     --- Professional ---                        K29.70, Gastritis, unspecified, without bleeding                        K22.4, Dyskinesia of esophagus                        R13.10, Dysphagia, unspecified CPT copyright 2022 [employer]. All rights reserved. The codes documented in this report are preliminary and upon coder review may  be revised to meet current compliance requirements. Attending Participation:      I personally performed the entire procedure. Elspeth Jungling, DO [name] DO, DO 03/08/2024 10:51:42 AM This report has been signed electronically. Number of Addenda: 0 Note Initiated On: 03/08/2024 10:17 AM Estimated Blood Loss:  Estimated blood loss was minimal.      [facility]

## 2024-03-08 NOTE — Anesthesia Preprocedure Evaluation (Signed)
 Anesthesia Evaluation  Patient identified by MRN, date of birth, ID band Patient awake    Reviewed: Allergy & Precautions, H&P , NPO status , Patient's Chart, lab work & pertinent test results, reviewed documented beta blocker date and time   History of Anesthesia Complications (+) PONV and history of anesthetic complications  Airway Mallampati: II   Neck ROM: full    Dental  (+) Poor Dentition   Pulmonary sleep apnea and Continuous Positive Airway Pressure Ventilation , pneumonia, COPD,  COPD inhaler, former smoker   Pulmonary exam normal        Cardiovascular Exercise Tolerance: Poor hypertension, On Medications + Peripheral Vascular Disease  Normal cardiovascular exam+ Valvular Problems/Murmurs  Rhythm:regular Rate:Normal     Neuro/Psych    Depression     Neuromuscular disease  negative psych ROS   GI/Hepatic Neg liver ROS,GERD  Medicated,,  Endo/Other  negative endocrine ROS    Renal/GU negative Renal ROS  negative genitourinary   Musculoskeletal   Abdominal   Peds  Hematology negative hematology ROS (+)   Anesthesia Other Findings Past Medical History: No date: Allergic rhinitis due to allergen No date: Allergy 2008: Breast cancer [facility])     Comment:  RT LUMPECTOMY 2011: Breast cancer (HCC)     Comment:  RT MASTECTOMY 2008,2011 : Cancer (HCC)     Comment:   High-grade DCIS,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 No date: Cervical post-laminectomy syndrome No date: Chronic constipation 2010: Colon polyp No date: Complication of anesthesia     Comment:  itching real bad or msucles spasms 02/21/2019: COPD (chronic obstructive pulmonary disease) (HCC) No date: Cubital tunnel syndrome No date: Depression No date: Disorders of sacrum No date: Disturbance of skin sensation 2020: Emphysema lung (HCC) No date: GERD (gastroesophageal reflux disease) No date: Heart murmur No date: History of  abnormal cervical Pap smear No date: History of cataract No date: History of colon polyps 2008: History of pneumonia No date: Hot flashes No date: Hx of diplopia     Comment:  right eye No date: Hyperlipidemia No date: Hypertension No date: Insomnia No date: Late effects of acute poliomyelitis No date: Lateral epicondylitis  of elbow No date: Loss of memory No date: Menopausal state No date: Neck pain No date: Osteoporosis No date: Pain in joint of left shoulder 2011: Parkinson's disease (HCC) 2011: Peripheral vascular disease     Comment:  venous stasis  No date: Personal history of radiation therapy No date: PONV (postoperative nausea and vomiting) 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 2008: Radiation     Comment:  BREAST CA No date: Sleep apnea No date: Tremor No date: Unspecified musculoskeletal disorders and symptoms referable  to neck     Comment:  cervical/trapezius No date: Vitamin D  deficiency Past Surgical History: 1997: ABDOMINAL HYSTERECTOMY 02/01/2019: ANTERIOR CERVICAL DECOMP/DISCECTOMY FUSION; N/A     Comment:  Procedure: Anterior Cervical Decompression Fusion               Cervical three-four, Cervical four-five, hardware removal              Cervical five-six;  Surgeon: Louis Shove, MD;  Location:               [facility] OR;  Service: Neurosurgery;  Laterality: N/A; 04/12/2023: BOWEL RESECTION; N/A     Comment:  Procedure: SMALL BOWEL RESECTION;  Surgeon: Jordis Cliche  F, MD;  Location: [facility] ORS;  Service: General;                Laterality: N/A; 2008: BREAST LUMPECTOMY; Right     Comment:  4 cm area of DCIS, margins less than 1 mm. Treated with               wide excision, whole breast radiation 12/04/2009: BREAST SURGERY; Right     Comment:  Right simple mastectomy, sentinel node biopsy. No date: CARPAL TUNNEL RELEASE 7989,7984: COLONOSCOPY     Comment:  Dr. Jinny, Dr. Viktoria No date: COLONOSCOPY 06/18/2022: COLONOSCOPY; N/A     Comment:  Procedure:  COLONOSCOPY;  Surgeon: Onita Elspeth Sharper,              DO;  Location: [facility] ENDOSCOPY;  Service:               Gastroenterology;  Laterality: N/A; 10/07/2022: COLONOSCOPY; N/A     Comment:  Procedure: COLONOSCOPY;  Surgeon: Onita Elspeth Sharper,              DO;  Location: [facility] ENDOSCOPY;  Service:               Gastroenterology;  Laterality: N/A; 05/23/2017: COLONOSCOPY WITH PROPOFOL ; N/A     Comment:  Procedure: COLONOSCOPY WITH PROPOFOL ;  Surgeon: Viktoria Lamar DASEN, MD;  Location: [facility] ENDOSCOPY;  Service:               Endoscopy;  Laterality: N/A; 2011: ELBOW SURGERY; Left 2015: ELBOW SURGERY; Left No date: ESOPHAGOGASTRODUODENOSCOPY 08/20/2021: ESOPHAGOGASTRODUODENOSCOPY; N/A     Comment:  Procedure: ESOPHAGOGASTRODUODENOSCOPY (EGD);  Surgeon:               Onita Elspeth Sharper, DO;  Location: [facility] ENDOSCOPY;                Service: Gastroenterology;  Laterality: N/A; 07/19/2000: FLEXIBLE SIGMOIDOSCOPY 06/19/2012: HIP SURGERY; Right 04/12/2023: LAPAROTOMY; N/A     Comment:  Procedure: EXPLORATORY LAPAROTOMY;  Surgeon: Jordis Laneta FALCON, MD;  Location: [facility] ORS;  Service: General;                Laterality: N/A; 1958: LEG SURGERY; Right     Comment:  Corrcetive surgery for polio 1958: LEG SURGERY; Left     Comment:  corrective surgery for polio 2011: MASTECTOMY; Right     Comment:  BREAST CA No date: rectocele/enterocelle repair and perinoplasty 7989,7987: SHOULDER ARTHROSCOPY W/ ROTATOR CUFF REPAIR; Bilateral 08/19/2015: SHOULDER ARTHROSCOPY WITH OPEN ROTATOR CUFF REPAIR; Left     Comment:  Procedure: SHOULDER ARTHROSCOPY WITH  MINI OPEN ROTATOR               CUFF REPAIR,SUBACROMIAL DECOMPRESSION, ARTHROSCOPIC               BICEPS TENODESIS;  Surgeon: Franky Cranker, MD;                Location: [facility] ORS;  Service: Orthopedics;  Laterality:               Left; 2014: SHOULDER SURGERY; Left No date: SPINE SURGERY 1970: TOE FUSION; Left      Comment:  little toe fusion No date: TONSILLECTOMY No date: TUBAL LIGATION No date: two left foot surgeries:polio related   Reproductive/Obstetrics negative OB ROS  Anesthesia Physical Anesthesia Plan  ASA: 3  Anesthesia Plan: General   Post-op Pain Management:    Induction:   PONV Risk Score and Plan:   Airway Management Planned:   Additional Equipment:   Intra-op Plan:   Post-operative Plan:   Informed Consent: I have reviewed the patients History and Physical, chart, labs and discussed the procedure including the risks, benefits and alternatives for the proposed anesthesia with the patient or authorized representative who has indicated his/her understanding and acceptance.     Dental Advisory Given  Plan Discussed with: CRNA  Anesthesia Plan Comments:         Anesthesia Quick Evaluation

## 2024-03-09 LAB — SURGICAL PATHOLOGY

## 2024-03-14 NOTE — Anesthesia Postprocedure Evaluation (Signed)
 Anesthesia Post Note  Patient: Andrea [name]  Procedure(s) Performed: EGD (ESOPHAGOGASTRODUODENOSCOPY) DILATION, ESOPHAGUS  Patient location during evaluation: PACU Anesthesia Type: General Level of consciousness: awake and alert Pain management: pain level controlled Vital Signs Assessment: post-procedure vital signs reviewed and stable Respiratory status: spontaneous breathing, nonlabored ventilation, respiratory function stable and patient connected to nasal cannula oxygen Cardiovascular status: blood pressure returned to baseline and stable Postop Assessment: no apparent nausea or vomiting Anesthetic complications: no   No notable events documented.   Last Vitals:  Vitals:   03/08/24 1047 03/08/24 1057  BP: 139/83 133/83  Pulse: 65 74  Resp: 10 15  Temp: (!) 36.2 C   SpO2: 99% 97%    Last Pain:  Vitals:   03/09/24 0745  TempSrc:   PainSc: 0-No pain                 Andrea [name]
